# Patient Record
Sex: Male | Born: 1945 | Race: White | Hispanic: No | Marital: Married | Smoking: Former smoker
Health system: Southern US, Community
[De-identification: ages and names within clinical notes are randomized; demographics above are authoritative.]

## PROBLEM LIST (undated history)

## (undated) DIAGNOSIS — K219 Gastro-esophageal reflux disease without esophagitis: Secondary | ICD-10-CM

## (undated) DIAGNOSIS — H409 Unspecified glaucoma: Secondary | ICD-10-CM

## (undated) DIAGNOSIS — M1710 Unilateral primary osteoarthritis, unspecified knee: Secondary | ICD-10-CM

## (undated) DIAGNOSIS — M179 Osteoarthritis of knee, unspecified: Secondary | ICD-10-CM

## (undated) DIAGNOSIS — I10 Essential (primary) hypertension: Secondary | ICD-10-CM

## (undated) DIAGNOSIS — I739 Peripheral vascular disease, unspecified: Secondary | ICD-10-CM

## (undated) DIAGNOSIS — R309 Painful micturition, unspecified: Secondary | ICD-10-CM

## (undated) DIAGNOSIS — M1990 Unspecified osteoarthritis, unspecified site: Secondary | ICD-10-CM

## (undated) HISTORY — DX: Osteoarthritis of knee, unspecified: M17.9

## (undated) HISTORY — PX: TONSILLECTOMY: SUR1361

## (undated) HISTORY — DX: Unilateral primary osteoarthritis, unspecified knee: M17.10

## (undated) HISTORY — DX: Unspecified glaucoma: H40.9

## (undated) HISTORY — PX: KNEE ARTHROSCOPY: SUR90

## (undated) HISTORY — PX: CATARACT EXTRACTION, BILATERAL: SHX1313

## (undated) HISTORY — DX: Painful micturition, unspecified: R30.9

## (undated) HISTORY — PX: ESOPHAGOGASTRODUODENOSCOPY ENDOSCOPY: SHX5814

---

## 2003-02-17 ENCOUNTER — Encounter (INDEPENDENT_AMBULATORY_CARE_PROVIDER_SITE_OTHER): Payer: Self-pay | Admitting: *Deleted

## 2003-02-17 ENCOUNTER — Ambulatory Visit (HOSPITAL_COMMUNITY): Admission: RE | Admit: 2003-02-17 | Discharge: 2003-02-17 | Payer: Self-pay | Admitting: Gastroenterology

## 2007-06-05 ENCOUNTER — Encounter: Admission: RE | Admit: 2007-06-05 | Discharge: 2007-06-05 | Payer: Self-pay | Admitting: Family Medicine

## 2010-08-18 NOTE — Op Note (Signed)
NAME:  Todd Chan, Todd Chan                        ACCOUNT NO.:  [id number]   MEDICAL RECORD NO.:  [id number]                   PATIENT TYPE:  AMB   LOCATION:  ENDO                                 FACILITY:  [facility]   PHYSICIAN:  Graylin Shiver, M.D.                DATE OF BIRTH:  11-20-1945   DATE OF PROCEDURE:  02/17/2003  DATE OF DISCHARGE:                                 OPERATIVE REPORT   PROCEDURE:  Colonoscopy.   INDICATIONS FOR PROCEDURE:  History of colon polyps.   CONSENT:  Informed consent was obtained after explanation of the risks of  bleeding, infection, and perforation.   PREMEDICATION:  Fentanyl 100 mcg IV, Versed 10 mg IV.   PROCEDURE IN DETAIL:  With the patient in the left lateral decubitus  position, a rectal exam was performed and no masses were felt.  The Olympus  colonoscope was inserted into the rectum and advanced around the colon to  the cecum.  Cecal landmarks were identified.  The cecum  and ascending colon  were normal.  The transverse colon was normal.  The descending colon and  sigmoid were normal.  In the rectum, there was a small 3 mm sessile polyp  removed with cold forceps.  He tolerated the procedure well without  complications.   IMPRESSION:  Small rectal polyp.   PLAN:  The pathology will be checked.                                               Graylin Shiver, M.D.    SFG/MEDQ  D:  02/17/2003  T:  02/17/2003  Job:  [id number]   cc:   Joycelyn Rua, M.D.  [address] [place]  [place] [address]  Fax: [phone]

## 2012-07-15 DIAGNOSIS — R350 Frequency of micturition: Secondary | ICD-10-CM | POA: Diagnosis not present

## 2012-07-15 DIAGNOSIS — N410 Acute prostatitis: Secondary | ICD-10-CM | POA: Diagnosis not present

## 2012-12-30 DIAGNOSIS — Z23 Encounter for immunization: Secondary | ICD-10-CM | POA: Diagnosis not present

## 2013-01-28 ENCOUNTER — Ambulatory Visit (HOSPITAL_BASED_OUTPATIENT_CLINIC_OR_DEPARTMENT_OTHER)
Admission: RE | Admit: 2013-01-28 | Discharge: 2013-01-28 | Disposition: A | Discharge status: Home / Self Care | Payer: Medicare Other | Source: Ambulatory Visit | Attending: Family Medicine | Admitting: Family Medicine

## 2013-01-28 ENCOUNTER — Other Ambulatory Visit (HOSPITAL_BASED_OUTPATIENT_CLINIC_OR_DEPARTMENT_OTHER): Payer: Self-pay | Admitting: Family Medicine

## 2013-01-28 DIAGNOSIS — I8390 Asymptomatic varicose veins of unspecified lower extremity: Secondary | ICD-10-CM | POA: Diagnosis not present

## 2013-01-28 DIAGNOSIS — M7989 Other specified soft tissue disorders: Secondary | ICD-10-CM | POA: Insufficient documentation

## 2013-01-28 DIAGNOSIS — M7120 Synovial cyst of popliteal space [Baker], unspecified knee: Secondary | ICD-10-CM | POA: Insufficient documentation

## 2013-01-28 DIAGNOSIS — I8001 Phlebitis and thrombophlebitis of superficial vessels of right lower extremity: Secondary | ICD-10-CM

## 2013-01-28 DIAGNOSIS — M79609 Pain in unspecified limb: Secondary | ICD-10-CM | POA: Insufficient documentation

## 2013-01-28 DIAGNOSIS — I8000 Phlebitis and thrombophlebitis of superficial vessels of unspecified lower extremity: Secondary | ICD-10-CM | POA: Diagnosis not present

## 2013-05-04 DIAGNOSIS — Z136 Encounter for screening for cardiovascular disorders: Secondary | ICD-10-CM | POA: Diagnosis not present

## 2013-05-04 DIAGNOSIS — Z23 Encounter for immunization: Secondary | ICD-10-CM | POA: Diagnosis not present

## 2013-05-04 DIAGNOSIS — I10 Essential (primary) hypertension: Secondary | ICD-10-CM | POA: Diagnosis not present

## 2013-05-04 DIAGNOSIS — L570 Actinic keratosis: Secondary | ICD-10-CM | POA: Diagnosis not present

## 2013-05-04 DIAGNOSIS — Z0000 Encounter for general adult medical examination without abnormal findings: Secondary | ICD-10-CM | POA: Diagnosis not present

## 2013-05-08 ENCOUNTER — Ambulatory Visit (HOSPITAL_COMMUNITY): Payer: Medicare Other | Attending: Cardiovascular Disease

## 2013-05-08 ENCOUNTER — Encounter: Payer: Self-pay | Admitting: Cardiovascular Disease

## 2013-05-08 DIAGNOSIS — F17200 Nicotine dependence, unspecified, uncomplicated: Secondary | ICD-10-CM | POA: Diagnosis not present

## 2013-05-08 DIAGNOSIS — Z1389 Encounter for screening for other disorder: Secondary | ICD-10-CM | POA: Insufficient documentation

## 2013-05-08 DIAGNOSIS — Z87891 Personal history of nicotine dependence: Secondary | ICD-10-CM | POA: Diagnosis not present

## 2013-05-08 DIAGNOSIS — I10 Essential (primary) hypertension: Secondary | ICD-10-CM | POA: Insufficient documentation

## 2013-05-08 DIAGNOSIS — Z136 Encounter for screening for cardiovascular disorders: Secondary | ICD-10-CM

## 2014-01-06 DIAGNOSIS — Z23 Encounter for immunization: Secondary | ICD-10-CM | POA: Diagnosis not present

## 2014-03-23 DIAGNOSIS — R079 Chest pain, unspecified: Secondary | ICD-10-CM | POA: Diagnosis not present

## 2014-03-23 DIAGNOSIS — M546 Pain in thoracic spine: Secondary | ICD-10-CM | POA: Diagnosis not present

## 2014-05-13 DIAGNOSIS — Z0000 Encounter for general adult medical examination without abnormal findings: Secondary | ICD-10-CM | POA: Diagnosis not present

## 2014-05-13 DIAGNOSIS — Z125 Encounter for screening for malignant neoplasm of prostate: Secondary | ICD-10-CM | POA: Diagnosis not present

## 2014-05-13 DIAGNOSIS — I10 Essential (primary) hypertension: Secondary | ICD-10-CM | POA: Diagnosis not present

## 2014-05-13 DIAGNOSIS — K219 Gastro-esophageal reflux disease without esophagitis: Secondary | ICD-10-CM | POA: Diagnosis not present

## 2014-05-13 DIAGNOSIS — Z1211 Encounter for screening for malignant neoplasm of colon: Secondary | ICD-10-CM | POA: Diagnosis not present

## 2014-06-15 ENCOUNTER — Other Ambulatory Visit: Payer: Self-pay | Admitting: Gastroenterology

## 2014-06-15 DIAGNOSIS — Z09 Encounter for follow-up examination after completed treatment for conditions other than malignant neoplasm: Secondary | ICD-10-CM | POA: Diagnosis not present

## 2014-06-15 DIAGNOSIS — D123 Benign neoplasm of transverse colon: Secondary | ICD-10-CM | POA: Diagnosis not present

## 2014-06-15 DIAGNOSIS — Z86010 Personal history of colonic polyps: Secondary | ICD-10-CM | POA: Diagnosis not present

## 2014-06-15 DIAGNOSIS — D126 Benign neoplasm of colon, unspecified: Secondary | ICD-10-CM | POA: Diagnosis not present

## 2014-11-09 DIAGNOSIS — M5416 Radiculopathy, lumbar region: Secondary | ICD-10-CM | POA: Diagnosis not present

## 2014-11-09 DIAGNOSIS — I10 Essential (primary) hypertension: Secondary | ICD-10-CM | POA: Diagnosis not present

## 2014-11-09 DIAGNOSIS — M170 Bilateral primary osteoarthritis of knee: Secondary | ICD-10-CM | POA: Diagnosis not present

## 2014-11-22 DIAGNOSIS — M25561 Pain in right knee: Secondary | ICD-10-CM | POA: Diagnosis not present

## 2014-11-22 DIAGNOSIS — M25562 Pain in left knee: Secondary | ICD-10-CM | POA: Diagnosis not present

## 2014-11-24 DIAGNOSIS — M170 Bilateral primary osteoarthritis of knee: Secondary | ICD-10-CM | POA: Diagnosis not present

## 2014-11-30 DIAGNOSIS — M170 Bilateral primary osteoarthritis of knee: Secondary | ICD-10-CM | POA: Diagnosis not present

## 2014-12-02 DIAGNOSIS — M170 Bilateral primary osteoarthritis of knee: Secondary | ICD-10-CM | POA: Diagnosis not present

## 2014-12-07 DIAGNOSIS — M170 Bilateral primary osteoarthritis of knee: Secondary | ICD-10-CM | POA: Diagnosis not present

## 2014-12-09 DIAGNOSIS — M170 Bilateral primary osteoarthritis of knee: Secondary | ICD-10-CM | POA: Diagnosis not present

## 2014-12-14 DIAGNOSIS — M170 Bilateral primary osteoarthritis of knee: Secondary | ICD-10-CM | POA: Diagnosis not present

## 2014-12-16 DIAGNOSIS — M170 Bilateral primary osteoarthritis of knee: Secondary | ICD-10-CM | POA: Diagnosis not present

## 2014-12-20 DIAGNOSIS — M25561 Pain in right knee: Secondary | ICD-10-CM | POA: Diagnosis not present

## 2015-01-10 DIAGNOSIS — M25561 Pain in right knee: Secondary | ICD-10-CM | POA: Diagnosis not present

## 2015-01-10 DIAGNOSIS — M1711 Unilateral primary osteoarthritis, right knee: Secondary | ICD-10-CM | POA: Diagnosis not present

## 2015-01-10 DIAGNOSIS — M1712 Unilateral primary osteoarthritis, left knee: Secondary | ICD-10-CM | POA: Diagnosis not present

## 2015-01-10 DIAGNOSIS — M25562 Pain in left knee: Secondary | ICD-10-CM | POA: Diagnosis not present

## 2015-01-11 DIAGNOSIS — Z23 Encounter for immunization: Secondary | ICD-10-CM | POA: Diagnosis not present

## 2015-01-17 DIAGNOSIS — M25562 Pain in left knee: Secondary | ICD-10-CM | POA: Diagnosis not present

## 2015-01-17 DIAGNOSIS — M1712 Unilateral primary osteoarthritis, left knee: Secondary | ICD-10-CM | POA: Diagnosis not present

## 2015-01-17 DIAGNOSIS — M1711 Unilateral primary osteoarthritis, right knee: Secondary | ICD-10-CM | POA: Diagnosis not present

## 2015-01-17 DIAGNOSIS — M25561 Pain in right knee: Secondary | ICD-10-CM | POA: Diagnosis not present

## 2015-01-24 DIAGNOSIS — M1711 Unilateral primary osteoarthritis, right knee: Secondary | ICD-10-CM | POA: Diagnosis not present

## 2015-01-24 DIAGNOSIS — M1712 Unilateral primary osteoarthritis, left knee: Secondary | ICD-10-CM | POA: Diagnosis not present

## 2015-01-24 DIAGNOSIS — M25562 Pain in left knee: Secondary | ICD-10-CM | POA: Diagnosis not present

## 2015-01-24 DIAGNOSIS — M25561 Pain in right knee: Secondary | ICD-10-CM | POA: Diagnosis not present

## 2015-02-01 DIAGNOSIS — M170 Bilateral primary osteoarthritis of knee: Secondary | ICD-10-CM | POA: Diagnosis not present

## 2015-02-04 DIAGNOSIS — R0982 Postnasal drip: Secondary | ICD-10-CM | POA: Diagnosis not present

## 2015-02-04 DIAGNOSIS — M545 Low back pain: Secondary | ICD-10-CM | POA: Diagnosis not present

## 2015-02-08 DIAGNOSIS — M170 Bilateral primary osteoarthritis of knee: Secondary | ICD-10-CM | POA: Diagnosis not present

## 2015-02-14 DIAGNOSIS — M545 Low back pain: Secondary | ICD-10-CM | POA: Diagnosis not present

## 2015-02-16 DIAGNOSIS — M545 Low back pain: Secondary | ICD-10-CM | POA: Diagnosis not present

## 2015-02-21 DIAGNOSIS — M545 Low back pain: Secondary | ICD-10-CM | POA: Diagnosis not present

## 2015-02-28 DIAGNOSIS — M545 Low back pain: Secondary | ICD-10-CM | POA: Diagnosis not present

## 2015-03-02 DIAGNOSIS — M545 Low back pain: Secondary | ICD-10-CM | POA: Diagnosis not present

## 2015-03-15 ENCOUNTER — Other Ambulatory Visit: Payer: Self-pay | Admitting: Orthopedic Surgery

## 2015-03-15 DIAGNOSIS — M545 Low back pain: Secondary | ICD-10-CM | POA: Diagnosis not present

## 2015-03-15 DIAGNOSIS — M4806 Spinal stenosis, lumbar region: Secondary | ICD-10-CM | POA: Diagnosis not present

## 2015-03-22 ENCOUNTER — Ambulatory Visit
Admission: RE | Admit: 2015-03-22 | Discharge: 2015-03-22 | Disposition: A | Discharge status: Home / Self Care | Payer: Medicare Other | Source: Ambulatory Visit | Attending: Orthopedic Surgery | Admitting: Orthopedic Surgery

## 2015-03-22 DIAGNOSIS — M4806 Spinal stenosis, lumbar region: Secondary | ICD-10-CM | POA: Diagnosis not present

## 2015-03-22 DIAGNOSIS — M545 Low back pain: Secondary | ICD-10-CM

## 2015-04-15 DIAGNOSIS — M545 Low back pain: Secondary | ICD-10-CM | POA: Diagnosis not present

## 2015-04-15 DIAGNOSIS — M4806 Spinal stenosis, lumbar region: Secondary | ICD-10-CM | POA: Diagnosis not present

## 2015-04-22 DIAGNOSIS — B372 Candidiasis of skin and nail: Secondary | ICD-10-CM | POA: Diagnosis not present

## 2015-04-27 DIAGNOSIS — M5136 Other intervertebral disc degeneration, lumbar region: Secondary | ICD-10-CM | POA: Diagnosis not present

## 2015-05-06 DIAGNOSIS — M170 Bilateral primary osteoarthritis of knee: Secondary | ICD-10-CM | POA: Diagnosis not present

## 2015-05-10 DIAGNOSIS — M4806 Spinal stenosis, lumbar region: Secondary | ICD-10-CM | POA: Diagnosis not present

## 2015-05-10 DIAGNOSIS — M545 Low back pain: Secondary | ICD-10-CM | POA: Diagnosis not present

## 2015-05-13 DIAGNOSIS — Z1389 Encounter for screening for other disorder: Secondary | ICD-10-CM | POA: Diagnosis not present

## 2015-05-13 DIAGNOSIS — Z131 Encounter for screening for diabetes mellitus: Secondary | ICD-10-CM | POA: Diagnosis not present

## 2015-05-13 DIAGNOSIS — B356 Tinea cruris: Secondary | ICD-10-CM | POA: Diagnosis not present

## 2015-06-21 ENCOUNTER — Other Ambulatory Visit (HOSPITAL_BASED_OUTPATIENT_CLINIC_OR_DEPARTMENT_OTHER): Payer: Self-pay | Admitting: Physician Assistant

## 2015-06-21 ENCOUNTER — Ambulatory Visit (HOSPITAL_BASED_OUTPATIENT_CLINIC_OR_DEPARTMENT_OTHER)
Admission: RE | Admit: 2015-06-21 | Discharge: 2015-06-21 | Disposition: A | Discharge status: Home / Self Care | Payer: Medicare Other | Source: Ambulatory Visit | Attending: Physician Assistant | Admitting: Physician Assistant

## 2015-06-21 DIAGNOSIS — R1031 Right lower quadrant pain: Secondary | ICD-10-CM

## 2015-06-21 DIAGNOSIS — R1030 Lower abdominal pain, unspecified: Secondary | ICD-10-CM | POA: Diagnosis not present

## 2015-06-21 DIAGNOSIS — M47896 Other spondylosis, lumbar region: Secondary | ICD-10-CM | POA: Insufficient documentation

## 2015-06-21 DIAGNOSIS — M25551 Pain in right hip: Secondary | ICD-10-CM | POA: Diagnosis not present

## 2015-06-22 ENCOUNTER — Ambulatory Visit (HOSPITAL_BASED_OUTPATIENT_CLINIC_OR_DEPARTMENT_OTHER): Payer: Medicare Other

## 2015-07-05 DIAGNOSIS — M4806 Spinal stenosis, lumbar region: Secondary | ICD-10-CM | POA: Diagnosis not present

## 2015-07-05 DIAGNOSIS — M545 Low back pain: Secondary | ICD-10-CM | POA: Diagnosis not present

## 2015-07-12 ENCOUNTER — Encounter (HOSPITAL_COMMUNITY): Payer: Self-pay | Admitting: Registered Nurse

## 2015-08-18 DIAGNOSIS — R1031 Right lower quadrant pain: Secondary | ICD-10-CM | POA: Diagnosis not present

## 2015-09-05 ENCOUNTER — Inpatient Hospital Stay: Admit: 2015-09-05 | Payer: Medicare Other | Admitting: Orthopedic Surgery

## 2015-09-05 SURGERY — ARTHROPLASTY, KNEE, TOTAL
Anesthesia: Choice | Site: Knee | Laterality: Right

## 2015-09-21 DIAGNOSIS — Z125 Encounter for screening for malignant neoplasm of prostate: Secondary | ICD-10-CM | POA: Diagnosis not present

## 2015-09-21 DIAGNOSIS — I10 Essential (primary) hypertension: Secondary | ICD-10-CM | POA: Diagnosis not present

## 2015-09-21 DIAGNOSIS — E785 Hyperlipidemia, unspecified: Secondary | ICD-10-CM | POA: Diagnosis not present

## 2015-09-21 DIAGNOSIS — M179 Osteoarthritis of knee, unspecified: Secondary | ICD-10-CM | POA: Diagnosis not present

## 2015-09-21 DIAGNOSIS — Z0000 Encounter for general adult medical examination without abnormal findings: Secondary | ICD-10-CM | POA: Diagnosis not present

## 2015-10-17 DIAGNOSIS — Q540 Hypospadias, balanic: Secondary | ICD-10-CM | POA: Diagnosis not present

## 2015-10-17 DIAGNOSIS — R1031 Right lower quadrant pain: Secondary | ICD-10-CM | POA: Diagnosis not present

## 2015-10-17 DIAGNOSIS — B356 Tinea cruris: Secondary | ICD-10-CM | POA: Diagnosis not present

## 2015-12-06 DIAGNOSIS — M5136 Other intervertebral disc degeneration, lumbar region: Secondary | ICD-10-CM | POA: Diagnosis not present

## 2015-12-22 DIAGNOSIS — N390 Urinary tract infection, site not specified: Secondary | ICD-10-CM | POA: Diagnosis not present

## 2015-12-22 DIAGNOSIS — R309 Painful micturition, unspecified: Secondary | ICD-10-CM | POA: Diagnosis not present

## 2016-01-10 DIAGNOSIS — Z23 Encounter for immunization: Secondary | ICD-10-CM | POA: Diagnosis not present

## 2016-04-23 DIAGNOSIS — Q540 Hypospadias, balanic: Secondary | ICD-10-CM | POA: Diagnosis not present

## 2016-04-23 DIAGNOSIS — B356 Tinea cruris: Secondary | ICD-10-CM | POA: Diagnosis not present

## 2016-04-23 DIAGNOSIS — R1031 Right lower quadrant pain: Secondary | ICD-10-CM | POA: Diagnosis not present

## 2016-09-05 DIAGNOSIS — M1712 Unilateral primary osteoarthritis, left knee: Secondary | ICD-10-CM | POA: Diagnosis not present

## 2016-09-11 DIAGNOSIS — L570 Actinic keratosis: Secondary | ICD-10-CM | POA: Diagnosis not present

## 2016-09-11 DIAGNOSIS — E785 Hyperlipidemia, unspecified: Secondary | ICD-10-CM | POA: Diagnosis not present

## 2016-09-11 DIAGNOSIS — Z01818 Encounter for other preprocedural examination: Secondary | ICD-10-CM | POA: Diagnosis not present

## 2016-09-11 DIAGNOSIS — I10 Essential (primary) hypertension: Secondary | ICD-10-CM | POA: Diagnosis not present

## 2016-09-11 DIAGNOSIS — M179 Osteoarthritis of knee, unspecified: Secondary | ICD-10-CM | POA: Diagnosis not present

## 2016-09-14 ENCOUNTER — Ambulatory Visit: Payer: Self-pay | Admitting: Surgical

## 2016-09-14 DIAGNOSIS — M1711 Unilateral primary osteoarthritis, right knee: Secondary | ICD-10-CM | POA: Diagnosis not present

## 2016-09-14 DIAGNOSIS — M1712 Unilateral primary osteoarthritis, left knee: Secondary | ICD-10-CM | POA: Diagnosis not present

## 2016-09-14 NOTE — H&P (Signed)
Todd Chan DOB: 24-May-1945 Married / Language: English / Race: White Male Date of Admission:  10/08/2016 CC:  Left knee pain History of Present Illness The patient is a 71 year old male who comes in today for a preoperative History and Physical. The patient is scheduled for a left total knee arthroplasty to be performed by Dr. [name]. [name], MD at [facility] on 10-08-2016. The patient is a 71 year old male who presented with knee complaints. The patient reports left knee and right knee symptoms including: pain, swelling (bilateral ankles), catching and popping which began year(s) ago without any known injury. The patient describes the severity of the symptoms as moderate in severity. The patient describes their pain as aching.The patient feels that the symptoms are worsening. The patient has the current diagnosis of knee osteoarthritis. Previous work-up for this problem has included knee x-rays. Past treatment for this problem has included intra-articular injection of corticosteroids. Symptoms are reported to be located in the left anterior knee and right anterior knee and include knee pain. The patient reports that symptoms radiate to the right lower leg. Symptoms are exacerbated by weight bearing. Symptoms are relieved by rest. He has had one cortisone injection as well as a round of Supartz following that. No relief with either. He is very frustrated because the knee pain is interfering with his quality of life. Historically the left knee has been worse but he says that some days the right knee is worse and some days the left knee is worse. He says that occasionally he will have some slight groin pain on the right side. Both knees are up giving him a lot of trouble now. He has pain with most activities and the pain is starting to really limit what he can and cannot do. He is not having pain at rest or pain at night at this point. He has had cortisone and viscosupplement injections without  benefit. Historically, the left knee has been worse, but currently both knees hurt equally. He has seen Dr. [name] in the past and also seen Dr. [name] over at [name] for these injections. He was seen by Dr. [name] for second opinion. He is at a stage now where the knees are essentially taken over his life and he would like to be more active, but cannot do so because of the knees. He is not having any associated hip pain with this. He does have some back pain, but no lower extremity weakness or paresthesia. He is ready to proceed with surgery at this time. He would like to proceed with the left knee first at this time. They have been treated conservatively in the past for the above stated problem and despite conservative measures, they continue to have progressive pain and severe functional limitations and dysfunction. They have failed non-operative management including home exercise, medications, and injections. It is felt that they would benefit from undergoing total joint replacement. Risks and benefits of the procedure have been discussed with the patient and they elect to proceed with surgery. There are no active contraindications to surgery such as ongoing infection or rapidly progressive neurological disease.  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 Chronic Obstructive Lung Disease  Brother. Hypertension  Sister. Severe  allergy  Mother. Father  Deceased. age 74 Mother  Deceased. age 52  Social History Tobacco use  Former smoker. 03/14/2015: smoke(d) 2 pack(s) per day, smoked for 20 years Tobacco / smoke exposure  03/14/2015: no Children  2 Current drinker  03/14/2015: Currently drinks beer, wine and  hard liquor 8-14 times per week Current work status  retired Exercise  Light, weekly, 20 - 30 minutes, cycling. Exercises weekly; does other Living situation  live with spouse Marital status  married Most recent primary occupation  Retired No history of drug/alcohol rehab  Not under pain contract  Number of flights of stairs before winded  2-3 Advance Directives  Living Will, Healthcare POA  Medication History Aspirin (81MG  Tablet Chewable, Oral) Active. Lisinopril (10MG  Tablet, Oral) Active. (qd) Vitamin D (1000UNIT Tablet, Oral) Active. (qd) Multi Vitamin Mens (Oral) Active. (qd) Calcium 600 (1500 (600 Ca)MG Tablet, Oral) Active. Fish Oil (1200MG  Capsule DR, Oral) Active. (qd) Aleve (220MG  Tablet, Oral) Active.  Past Surgical History  Arthroscopic Knee Surgery - Left   Review of Systems  Constitutional: Negative.   HENT: Negative.   Respiratory: Negative.   Cardiovascular: Negative.   Gastrointestinal: Negative.   Genitourinary: Negative.   Musculoskeletal: Positive for joint pain.    Vitals  Weight: 281 lb Height: 71.5in Height was reported by patient. Body Surface Area: 2.45 m Body Mass Index: 38.64 kg/m  Pulse: 76 (Regular)  BP: 144/88 (Sitting, Right Arm, Standard)   Physical Exam  General Mental Status -Alert, cooperative and good historian. General Appearance-pleasant, Not in acute distress. Orientation-Oriented X3. Build & Nutrition-Well nourished and Well developed.  Head and Neck Head-normocephalic, atraumatic . Neck Global Assessment - supple, no bruit auscultated on the right, no bruit auscultated on the left.  Eye Vision-Wears contact lenses. Pupil - Bilateral-Regular and Round. Motion - Bilateral-EOMI.  Chest and Lung Exam Auscultation Breath sounds - clear at anterior chest wall and clear at posterior chest wall. Adventitious sounds - No Adventitious sounds.  Cardiovascular Auscultation Rhythm  - Regular rate and rhythm. Heart Sounds - S1 WNL and S2 WNL. Murmurs & Other Heart Sounds - Auscultation of the heart reveals - No Murmurs.  Abdomen Inspection Contour - Generalized moderate distention. Palpation/Percussion Tenderness - Abdomen is non-tender to palpation. Rigidity (guarding) - Abdomen is soft. Auscultation Auscultation of the abdomen reveals - Bowel sounds normal.  Male Genitourinary Note: Not done, not pertinent to present illness   Musculoskeletal Note: On exam, well-developed male, alert and oriented, in no apparent distress. Evaluation of his hips showed normal range of motion with no discomfort. His left knee shows no effusion. Range of motion is about 5 to 125. He has marked crepitus on range of motion, some tenderness medial greater than lateral with no instability. Right knee, no effusion, range about 5 to 125. Marked crepitus on range of motion and tenderness, medial greater than lateral and no instability. Pulse, sensation, motor intact distally. He has trace edema in both lower legs.  RADIOGRAPHS AP both knees and lateral show that he has end-stage arthritis of both knees, bone-on-bone medial and patellofemoral with slight varus.   Assessment & Plan  Primary osteoarthritis of left knee   Note:Surgical Plans: Left Total Knee Replacement  Disposition: Home with help, Straight to outpatient at [place]  PCP: Dr. Olen Pel - given verbal approval  IV TXA  Anesthesia Issues: None  Patient was instructed on what medications to stop prior to surgery.  Signed electronically by Joelene Millin, III PA-C

## 2016-09-14 NOTE — H&P (Signed)
Todd Chan DOB: 03-Jul-1945 Married / Language: English / Race: White Male Date of Admission:  10/08/2016 CC:  Left knee pain History of Present Illness The patient is a 71 year old male who comes in today for a preoperative History and Physical. The patient is scheduled for a left total knee arthroplasty to be performed by Dr. [name]. [name], MD at [facility] on 10-08-2016. The patient is a 71 year old male who presented with knee complaints. The patient reports left knee and right knee symptoms including: pain, swelling (bilateral ankles), catching and popping which began year(s) ago without any known injury. The patient describes the severity of the symptoms as moderate in severity. The patient describes their pain as aching.The patient feels that the symptoms are worsening. The patient has the current diagnosis of knee osteoarthritis. Previous work-up for this problem has included knee x-rays. Past treatment for this problem has included intra-articular injection of corticosteroids. Symptoms are reported to be located in the left anterior knee and right anterior knee and include knee pain. The patient reports that symptoms radiate to the right lower leg. Symptoms are exacerbated by weight bearing. Symptoms are relieved by rest. He has had one cortisone injection as well as a round of Supartz following that. No relief with either. He is very frustrated because the knee pain is interfering with his quality of life. Historically the left knee has been worse but he says that some days the right knee is worse and some days the left knee is worse. He says that occasionally he will have some slight groin pain on the right side. Both knees are up giving him a lot of trouble now. He has pain with most activities and the pain is starting to really limit what he can and cannot do. He is not having pain at rest or pain at night at this point. He has had cortisone and viscosupplement injections without  benefit. Historically, the left knee has been worse, but currently both knees hurt equally. He has seen Dr. [name] in the past and also seen Dr. [name] over at [facility] for these injections. He was seen by Dr. [name] for second opinion. He is at a stage now where the knees are essentially taken over his life and he would like to be more active, but cannot do so because of the knees. He is not having any associated hip pain with this. He does have some back pain, but no lower extremity weakness or paresthesia. He is ready to proceed with surgery at this time. He would like to proceed with the left knee first at this time. They have been treated conservatively in the past for the above stated problem and despite conservative measures, they continue to have progressive pain and severe functional limitations and dysfunction. They have failed non-operative management including home exercise, medications, and injections. It is felt that they would benefit from undergoing total joint replacement. Risks and benefits of the procedure have been discussed with the patient and they elect to proceed with surgery. There are no active contraindications to surgery such as ongoing infection or rapidly progressive neurological disease.  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 Chronic Obstructive Lung Disease  Brother. Hypertension  Sister. Severe  allergy  Mother. Father  Deceased. age 23 Mother  Deceased. age 31  Social History Tobacco use  Former smoker. 03/14/2015: smoke(d) 2 pack(s) per day, smoked for 20 years Tobacco / smoke exposure  03/14/2015: no Children  2 Current drinker  03/14/2015: Currently drinks beer, wine  and hard liquor 8-14 times per week Current work status  retired Exercise  Light, weekly, 20 - 30 minutes, cycling. Exercises weekly; does other Living situation  live with spouse Marital status  married Most recent primary occupation  Retired No history of drug/alcohol rehab  Not under pain contract  Number of flights of stairs before winded  2-3 Advance Directives  Living Will, Healthcare POA  Medication History Aspirin (81MG  Tablet Chewable, Oral) Active. Lisinopril (10MG  Tablet, Oral) Active. (qd) Vitamin D (1000UNIT Tablet, Oral) Active. (qd) Multi Vitamin Mens (Oral) Active. (qd) Calcium 600 (1500 (600 Ca)MG Tablet, Oral) Active. Fish Oil (1200MG  Capsule DR, Oral) Active. (qd) Aleve (220MG  Tablet, Oral) Active.  Past Surgical History  Arthroscopic Knee Surgery - Left   Review of Systems  Constitutional: Negative.   HENT: Negative.   Respiratory: Negative.   Cardiovascular: Negative.   Gastrointestinal: Negative.   Genitourinary: Negative.   Musculoskeletal: Positive for joint pain.    Vitals  Weight: 281 lb Height: 71.5in Height was reported by patient. Body Surface Area: 2.45 m Body Mass Index: 38.64 kg/m  Pulse: 76 (Regular)  BP: 144/88 (Sitting, Right Arm, Standard)   Physical Exam  General Mental Status -Alert, cooperative and good historian. General Appearance-pleasant, Not in acute distress. Orientation-Oriented X3. Build & Nutrition-Well nourished and Well developed.  Head and Neck Head-normocephalic, atraumatic . Neck Global Assessment - supple, no bruit auscultated on the right, no bruit auscultated on the left.  Eye Vision-Wears contact lenses. Pupil - Bilateral-Regular and Round. Motion - Bilateral-EOMI.  Chest and Lung Exam Auscultation Breath sounds - clear at anterior chest wall and clear at posterior chest wall. Adventitious sounds - No Adventitious  sounds.  Cardiovascular Auscultation Rhythm - Regular rate and rhythm. Heart Sounds - S1 WNL and S2 WNL. Murmurs & Other Heart Sounds - Auscultation of the heart reveals - No Murmurs.  Abdomen Inspection Contour - Generalized moderate distention. Palpation/Percussion Tenderness - Abdomen is non-tender to palpation. Rigidity (guarding) - Abdomen is soft. Auscultation Auscultation of the abdomen reveals - Bowel sounds normal.  Male Genitourinary Note: Not done, not pertinent to present illness   Musculoskeletal Note: On exam, well-developed male, alert and oriented, in no apparent distress. Evaluation of his hips showed normal range of motion with no discomfort. His left knee shows no effusion. Range of motion is about 5 to 125. He has marked crepitus on range of motion, some tenderness medial greater than lateral with no instability. Right knee, no effusion, range about 5 to 125. Marked crepitus on range of motion and tenderness, medial greater than lateral and no instability. Pulse, sensation, motor intact distally. He has trace edema in both lower legs.  RADIOGRAPHS AP both knees and lateral show that he has end-stage arthritis of both knees, bone-on-bone medial and patellofemoral with slight varus.   Assessment & Plan  Primary osteoarthritis of left knee   Note:Surgical Plans: Left Total Knee Replacement  Disposition: Home with help, Straight to outpatient at [facility]  PCP: Dr. Olen Pel - given verbal approval  IV TXA  Anesthesia Issues: None  Patient was instructed on what medications to stop prior to surgery.  Signed electronically by Joelene Millin, III PA-C

## 2016-09-26 NOTE — Progress Notes (Signed)
Need orders in epic.  Preop on 10/01/2016.

## 2016-09-28 ENCOUNTER — Other Ambulatory Visit (HOSPITAL_COMMUNITY): Payer: Self-pay | Admitting: Emergency Medicine

## 2016-09-28 ENCOUNTER — Ambulatory Visit: Payer: Self-pay | Admitting: Orthopedic Surgery

## 2016-09-28 NOTE — Patient Instructions (Signed)
Todd Chan  09/28/2016   Your procedure is scheduled on: 10-08-16  Report to [facility] Main  Entrance     Report to admitting at 1010AM   Call this number if you have problems the morning of surgery  [phone]   Remember: ONLY 1 PERSON MAY GO WITH YOU TO SHORT STAY TO GET  READY MORNING OF YOUR SURGERY.  Do not eat food or drink liquids :After Midnight.     Take these medicines the morning of surgery with A SIP OF WATER: none                                You may not have any metal on your body including hair pins and              piercings  Do not wear jewelry, make-up, lotions, powders or perfumes, deodorant                  Men may shave face and neck.   Do not bring valuables to the hospital. [place].  Contacts, dentures or bridgework may not be worn into surgery.  Leave suitcase in the car. After surgery it may be brought to your room.               Please read over the following fact sheets you were given: _____________________________________________________________________          [facility] - Preparing for Surgery Before surgery, you can play an important role.  Because skin is not sterile, your skin needs to be as free of germs as possible.  You can reduce the number of germs on your skin by washing with CHG (chlorahexidine gluconate) soap before surgery.  CHG is an antiseptic cleaner which kills germs and bonds with the skin to continue killing germs even after washing. Please DO NOT use if you have an allergy to CHG or antibacterial soaps.  If your skin becomes reddened/irritated stop using the CHG and inform your nurse when you arrive at Short Stay. Do not shave (including legs and underarms) for at least 48 hours prior to the first CHG shower.  You may shave your face/neck. Please follow these instructions carefully:  1.  Shower with CHG Soap the night before surgery and the   morning of Surgery.  2.  If you choose to wash your hair, wash your hair first as usual with your  normal  shampoo.  3.  After you shampoo, rinse your hair and body thoroughly to remove the  shampoo.                           4.  Use CHG as you would any other liquid soap.  You can apply chg directly  to the skin and wash                       Gently with a scrungie or clean washcloth.  5.  Apply the CHG Soap to your body ONLY FROM THE NECK DOWN.   Do not use on face/ open  Wound or open sores. Avoid contact with eyes, ears mouth and genitals (private parts).                       Wash face,  Genitals (private parts) with your normal soap.             6.  Wash thoroughly, paying special attention to the area where your surgery  will be performed.  7.  Thoroughly rinse your body with warm water from the neck down.  8.  DO NOT shower/wash with your normal soap after using and rinsing off  the CHG Soap.                9.  Pat yourself dry with a clean towel.            10.  Wear clean pajamas.            11.  Place clean sheets on your bed the night of your first shower and do not  sleep with pets. Day of Surgery : Do not apply any lotions/deodorants the morning of surgery.  Please wear clean clothes to the hospital/surgery center.  FAILURE TO FOLLOW THESE INSTRUCTIONS MAY RESULT IN THE CANCELLATION OF YOUR SURGERY PATIENT SIGNATURE_________________________________  NURSE SIGNATURE__________________________________  ________________________________________________________________________   Todd Chan  An incentive spirometer is a tool that can help keep your lungs clear and active. This tool measures how well you are filling your lungs with each breath. Taking long deep breaths may help reverse or decrease the chance of developing breathing (pulmonary) problems (especially infection) following:  A long period of time when you are unable to move or be  active. BEFORE THE PROCEDURE   If the spirometer includes an indicator to show your best effort, your nurse or respiratory therapist will set it to a desired goal.  If possible, sit up straight or lean slightly forward. Try not to slouch.  Hold the incentive spirometer in an upright position. INSTRUCTIONS FOR USE  1. Sit on the edge of your bed if possible, or sit up as far as you can in bed or on a chair. 2. Hold the incentive spirometer in an upright position. 3. Breathe out normally. 4. Place the mouthpiece in your mouth and seal your lips tightly around it. 5. Breathe in slowly and as deeply as possible, raising the piston or the ball toward the top of the column. 6. Hold your breath for 3-5 seconds or for as long as possible. Allow the piston or ball to fall to the bottom of the column. 7. Remove the mouthpiece from your mouth and breathe out normally. 8. Rest for a few seconds and repeat Steps 1 through 7 at least 10 times every 1-2 hours when you are awake. Take your time and take a few normal breaths between deep breaths. 9. The spirometer may include an indicator to show your best effort. Use the indicator as a goal to work toward during each repetition. 10. After each set of 10 deep breaths, practice coughing to be sure your lungs are clear. If you have an incision (the cut made at the time of surgery), support your incision when coughing by placing a pillow or rolled up towels firmly against it. Once you are able to get out of bed, walk around indoors and cough well. You may stop using the incentive spirometer when instructed by your caregiver.  RISKS AND COMPLICATIONS  Take your time so you do not get  dizzy or light-headed.  If you are in pain, you may need to take or ask for pain medication before doing incentive spirometry. It is harder to take a deep breath if you are having pain. AFTER USE  Rest and breathe slowly and easily.  It can be helpful to keep track of a log of  your progress. Your caregiver can provide you with a simple table to help with this. If you are using the spirometer at home, follow these instructions: [place] IF:   You are having difficultly using the spirometer.  You have trouble using the spirometer as often as instructed.  Your pain medication is not giving enough relief while using the spirometer.  You develop fever of 100.5 F (38.1 C) or higher. SEEK IMMEDIATE MEDICAL CARE IF:   You cough up bloody sputum that had not been present before.  You develop fever of 102 F (38.9 C) or greater.  You develop worsening pain at or near the incision site. MAKE SURE YOU:   Understand these instructions.  Will watch your condition.  Will get help right away if you are not doing well or get worse. Document Released: 07/30/2006 Document Revised: 06/11/2011 Document Reviewed: 09/30/2006 Exit[employer] Patient Information 2014 [employer], [place].   ________________________________________________________________________  WHAT IS A BLOOD TRANSFUSION? Blood Transfusion Information  A transfusion is the replacement of blood or some of its parts. Blood is made up of multiple cells which provide different functions.  Red blood cells carry oxygen and are used for blood loss replacement.  White blood cells fight against infection.  Platelets control bleeding.  Plasma helps clot blood.  Other blood products are available for specialized needs, such as hemophilia or other clotting disorders. BEFORE THE TRANSFUSION  Who gives blood for transfusions?   Healthy volunteers who are fully evaluated to make sure their blood is safe. This is blood bank blood. Transfusion therapy is the safest it has ever been in the practice of medicine. Before blood is taken from a donor, a complete history is taken to make sure that person has no history of diseases nor engages in risky social behavior (examples are intravenous drug use or sexual activity  with multiple partners). The donor's travel history is screened to minimize risk of transmitting infections, such as malaria. The donated blood is tested for signs of infectious diseases, such as HIV and hepatitis. The blood is then tested to be sure it is compatible with you in order to minimize the chance of a transfusion reaction. If you or a relative donates blood, this is often done in anticipation of surgery and is not appropriate for emergency situations. It takes many days to process the donated blood. RISKS AND COMPLICATIONS Although transfusion therapy is very safe and saves many lives, the main dangers of transfusion include:   Getting an infectious disease.  Developing a transfusion reaction. This is an allergic reaction to something in the blood you were given. Every precaution is taken to prevent this. The decision to have a blood transfusion has been considered carefully by your caregiver before blood is given. Blood is not given unless the benefits outweigh the risks. AFTER THE TRANSFUSION  Right after receiving a blood transfusion, you will usually feel much better and more energetic. This is especially true if your red blood cells have gotten low (anemic). The transfusion raises the level of the red blood cells which carry oxygen, and this usually causes an energy increase.  The nurse administering the transfusion will  monitor you carefully for complications. HOME CARE INSTRUCTIONS  No special instructions are needed after a transfusion. You may find your energy is better. Speak with your caregiver about any limitations on activity for underlying diseases you may have. SEEK MEDICAL CARE IF:   Your condition is not improving after your transfusion.  You develop redness or irritation at the intravenous (IV) site. SEEK IMMEDIATE MEDICAL CARE IF:  Any of the following symptoms occur over the next 12 hours:  Shaking chills.  You have a temperature by mouth above 102 F (38.9  C), not controlled by medicine.  Chest, back, or muscle pain.  People around you feel you are not acting correctly or are confused.  Shortness of breath or difficulty breathing.  Dizziness and fainting.  You get a rash or develop hives.  You have a decrease in urine output.  Your urine turns a dark color or changes to pink, red, or brown. Any of the following symptoms occur over the next 10 days:  You have a temperature by mouth above 102 F (38.9 C), not controlled by medicine.  Shortness of breath.  Weakness after normal activity.  The white part of the eye turns yellow (jaundice).  You have a decrease in the amount of urine or are urinating less often.  Your urine turns a dark color or changes to pink, red, or brown. Document Released: 03/16/2000 Document Revised: 06/11/2011 Document Reviewed: 11/03/2007 [employer] Patient Information 2014 [place], [place].  _______________________________________________________________________

## 2016-10-01 ENCOUNTER — Encounter (HOSPITAL_COMMUNITY): Payer: Self-pay

## 2016-10-01 ENCOUNTER — Ambulatory Visit: Payer: Self-pay | Admitting: Orthopedic Surgery

## 2016-10-01 ENCOUNTER — Encounter (HOSPITAL_COMMUNITY)
Admission: RE | Admit: 2016-10-01 | Discharge: 2016-10-01 | Disposition: A | Discharge status: Home / Self Care | Payer: Medicare Other | Source: Ambulatory Visit | Attending: Orthopedic Surgery | Admitting: Orthopedic Surgery

## 2016-10-01 DIAGNOSIS — I10 Essential (primary) hypertension: Secondary | ICD-10-CM | POA: Diagnosis not present

## 2016-10-01 DIAGNOSIS — Z01812 Encounter for preprocedural laboratory examination: Secondary | ICD-10-CM | POA: Diagnosis not present

## 2016-10-01 DIAGNOSIS — Z01818 Encounter for other preprocedural examination: Secondary | ICD-10-CM | POA: Diagnosis not present

## 2016-10-01 DIAGNOSIS — Z0183 Encounter for blood typing: Secondary | ICD-10-CM | POA: Diagnosis not present

## 2016-10-01 HISTORY — DX: Essential (primary) hypertension: I10

## 2016-10-01 HISTORY — DX: Peripheral vascular disease, unspecified: I73.9

## 2016-10-01 HISTORY — DX: Gastro-esophageal reflux disease without esophagitis: K21.9

## 2016-10-01 HISTORY — DX: Unspecified osteoarthritis, unspecified site: M19.90

## 2016-10-01 LAB — CBC
HCT: 42.1 % (ref 39.0–52.0)
HEMOGLOBIN: 14.7 g/dL (ref 13.0–17.0)
MCH: 34.1 pg — ABNORMAL HIGH (ref 26.0–34.0)
MCHC: 34.9 g/dL (ref 30.0–36.0)
MCV: 97.7 fL (ref 78.0–100.0)
Platelets: 174 10*3/uL (ref 150–400)
RBC: 4.31 MIL/uL (ref 4.22–5.81)
RDW: 13.1 % (ref 11.5–15.5)
WBC: 4.7 10*3/uL (ref 4.0–10.5)

## 2016-10-01 LAB — ABO/RH: ABO/RH(D): A NEG

## 2016-10-01 LAB — SURGICAL PCR SCREEN
MRSA, PCR: NEGATIVE
STAPHYLOCOCCUS AUREUS: POSITIVE — AB

## 2016-10-01 LAB — PROTIME-INR
INR: 1.01
PROTHROMBIN TIME: 13.3 s (ref 11.4–15.2)

## 2016-10-01 LAB — APTT: aPTT: 29 seconds (ref 24–36)

## 2016-10-01 NOTE — Progress Notes (Signed)
   10/01/16 0905  OBSTRUCTIVE SLEEP APNEA  Have you ever been diagnosed with sleep apnea through a sleep study? No  Do you snore loudly (loud enough to be heard through closed doors)?  1  Do you often feel tired, fatigued, or sleepy during the daytime (such as falling asleep during driving or talking to someone)? 0  Has anyone observed you stop breathing during your sleep? 0  Do you have, or are you being treated for high blood pressure? 1  BMI more than 35 kg/m2? 1  Age > 50 (1-yes) 1  Neck circumference greater than:Male 16 inches or larger, Male 17inches or larger? 1  Male Gender (Yes=1) 1  Obstructive Sleep Apnea Score 6

## 2016-10-01 NOTE — Progress Notes (Signed)
Pa from [facility] placed new informed consent with correct limb listed . RN will indicate on front page sheet for Short Stay to have patient sign new, correct consent on day of surgery as orders were placed in epic AFTER pt completed their pre-op and left hospital.

## 2016-10-01 NOTE — Progress Notes (Signed)
Surgical clearance Dr Olen Pel and Aron Baba PA-C on chart LOV Hepler PA-C 09-11-16 on chart

## 2016-10-01 NOTE — Progress Notes (Signed)
Per pat request , RN called [facility] to request lab work , Spoke with Caryl Pina who confirms she will fax over a copy.  Also , at pre-op pt states that he is supposed be having a left total knee replacement on 10-08-16 with [name]. Surgery schedule lists a left knee, however informed consent form in EPIC lists a right total knee. RN called over to [employer] and LVMM for [employer] to inquire about incorrect consent. RN will continue to F/U as needed

## 2016-10-02 NOTE — Progress Notes (Signed)
CMP 09-11-16 on chart included with LOV summary with Hepler PA-C

## 2016-10-07 MED ORDER — DEXTROSE 5 % IV SOLN
3.0000 g | INTRAVENOUS | Status: AC
  Administered 2016-10-08: 3 g via INTRAVENOUS
  Filled 2016-10-07: qty 3

## 2016-10-08 ENCOUNTER — Encounter (HOSPITAL_COMMUNITY)
Admission: RE | Disposition: A | Discharge status: Home / Self Care | Payer: Self-pay | Source: Ambulatory Visit | Attending: Orthopedic Surgery

## 2016-10-08 ENCOUNTER — Inpatient Hospital Stay (HOSPITAL_COMMUNITY)
Admission: RE | Admit: 2016-10-08 | Discharge: 2016-10-10 | DRG: 470 | Disposition: A | Discharge status: Home / Self Care | Payer: Medicare Other | Source: Ambulatory Visit | Attending: Orthopedic Surgery | Admitting: Orthopedic Surgery

## 2016-10-08 ENCOUNTER — Inpatient Hospital Stay (HOSPITAL_COMMUNITY): Payer: Medicare Other | Admitting: Anesthesiology

## 2016-10-08 ENCOUNTER — Encounter (HOSPITAL_COMMUNITY): Payer: Self-pay | Admitting: *Deleted

## 2016-10-08 DIAGNOSIS — M1712 Unilateral primary osteoarthritis, left knee: Secondary | ICD-10-CM | POA: Diagnosis not present

## 2016-10-08 DIAGNOSIS — Z8249 Family history of ischemic heart disease and other diseases of the circulatory system: Secondary | ICD-10-CM

## 2016-10-08 DIAGNOSIS — Z79899 Other long term (current) drug therapy: Secondary | ICD-10-CM | POA: Diagnosis not present

## 2016-10-08 DIAGNOSIS — K219 Gastro-esophageal reflux disease without esophagitis: Secondary | ICD-10-CM | POA: Diagnosis present

## 2016-10-08 DIAGNOSIS — M48061 Spinal stenosis, lumbar region without neurogenic claudication: Secondary | ICD-10-CM | POA: Diagnosis not present

## 2016-10-08 DIAGNOSIS — Z7982 Long term (current) use of aspirin: Secondary | ICD-10-CM

## 2016-10-08 DIAGNOSIS — Q540 Hypospadias, balanic: Secondary | ICD-10-CM

## 2016-10-08 DIAGNOSIS — M179 Osteoarthritis of knee, unspecified: Secondary | ICD-10-CM | POA: Diagnosis present

## 2016-10-08 DIAGNOSIS — M1710 Unilateral primary osteoarthritis, unspecified knee: Secondary | ICD-10-CM | POA: Diagnosis present

## 2016-10-08 DIAGNOSIS — I10 Essential (primary) hypertension: Secondary | ICD-10-CM | POA: Diagnosis not present

## 2016-10-08 DIAGNOSIS — Z87891 Personal history of nicotine dependence: Secondary | ICD-10-CM | POA: Diagnosis not present

## 2016-10-08 DIAGNOSIS — M25562 Pain in left knee: Secondary | ICD-10-CM | POA: Diagnosis not present

## 2016-10-08 DIAGNOSIS — G8918 Other acute postprocedural pain: Secondary | ICD-10-CM | POA: Diagnosis not present

## 2016-10-08 DIAGNOSIS — M797 Fibromyalgia: Secondary | ICD-10-CM | POA: Diagnosis present

## 2016-10-08 DIAGNOSIS — I739 Peripheral vascular disease, unspecified: Secondary | ICD-10-CM | POA: Diagnosis present

## 2016-10-08 HISTORY — PX: TOTAL KNEE ARTHROPLASTY: SHX125

## 2016-10-08 HISTORY — PX: REPLACEMENT TOTAL KNEE: SUR1224

## 2016-10-08 LAB — COMPREHENSIVE METABOLIC PANEL
ALBUMIN: 3.7 g/dL (ref 3.5–5.0)
ALT: 21 U/L (ref 17–63)
ANION GAP: 7 (ref 5–15)
AST: 25 U/L (ref 15–41)
Alkaline Phosphatase: 27 U/L — ABNORMAL LOW (ref 38–126)
BUN: 17 mg/dL (ref 6–20)
CO2: 30 mmol/L (ref 22–32)
Calcium: 8.6 mg/dL — ABNORMAL LOW (ref 8.9–10.3)
Chloride: 103 mmol/L (ref 101–111)
Creatinine, Ser: 0.89 mg/dL (ref 0.61–1.24)
GFR calc Af Amer: >60 mL/min (ref >60)
GFR calc non Af Amer: >60 mL/min (ref >60)
GLUCOSE: 142 mg/dL — AB (ref 65–99)
POTASSIUM: 4.7 mmol/L (ref 3.5–5.1)
SODIUM: 140 mmol/L (ref 135–145)
Total Bilirubin: 0.9 mg/dL (ref 0.3–1.2)
Total Protein: 6.2 g/dL — ABNORMAL LOW (ref 6.5–8.1)

## 2016-10-08 LAB — TYPE AND SCREEN
ABO/RH(D): A NEG
ANTIBODY SCREEN: NEGATIVE

## 2016-10-08 LAB — PROTIME-INR
INR: 1.03
PROTHROMBIN TIME: 13.5 s (ref 11.4–15.2)

## 2016-10-08 SURGERY — ARTHROPLASTY, KNEE, TOTAL
Anesthesia: Spinal | Site: Knee | Laterality: Left

## 2016-10-08 MED ORDER — ONDANSETRON HCL 4 MG/2ML IJ SOLN
4.0000 mg | Freq: Four times a day (QID) | INTRAMUSCULAR | Status: DC | PRN
Start: 2016-10-08 — End: 2016-10-10

## 2016-10-08 MED ORDER — PHENOL 1.4 % MT LIQD
1.0000 | OROMUCOSAL | Status: DC | PRN
  Filled 2016-10-08: qty 177

## 2016-10-08 MED ORDER — POLYETHYLENE GLYCOL 3350 17 G PO PACK: 17.0000 g | PACK | Freq: Every day | ORAL | Status: DC | PRN

## 2016-10-08 MED ORDER — OXYCODONE HCL 5 MG PO TABS
5.0000 mg | ORAL_TABLET | ORAL | Status: DC | PRN
  Administered 2016-10-08 – 2016-10-10 (×14): 10 mg via ORAL
  Filled 2016-10-08 (×14): qty 2

## 2016-10-08 MED ORDER — BISACODYL 10 MG RE SUPP: 10.0000 mg | Freq: Every day | RECTAL | Status: DC | PRN

## 2016-10-08 MED ORDER — MIDAZOLAM HCL 5 MG/ML IJ SOLN
2.0000 mg | Freq: Once | INTRAMUSCULAR | Status: AC
  Administered 2016-10-08: 2 mg via INTRAVENOUS

## 2016-10-08 MED ORDER — DOCUSATE SODIUM 100 MG PO CAPS
100.0000 mg | ORAL_CAPSULE | Freq: Two times a day (BID) | ORAL | Status: DC
  Administered 2016-10-08 – 2016-10-10 (×4): 100 mg via ORAL
  Filled 2016-10-08 (×4): qty 1

## 2016-10-08 MED ORDER — MIDAZOLAM HCL 2 MG/2ML IJ SOLN
INTRAMUSCULAR | Status: AC
Start: 2016-10-08 — End: 2016-10-08
  Filled 2016-10-08: qty 2

## 2016-10-08 MED ORDER — METHOCARBAMOL 1000 MG/10ML IJ SOLN
500.0000 mg | Freq: Four times a day (QID) | INTRAVENOUS | Status: DC | PRN
  Administered 2016-10-08: 500 mg via INTRAVENOUS
  Filled 2016-10-08: qty 550

## 2016-10-08 MED ORDER — ACETAMINOPHEN 650 MG RE SUPP: 650.0000 mg | Freq: Four times a day (QID) | RECTAL | Status: DC | PRN

## 2016-10-08 MED ORDER — MIDAZOLAM HCL 5 MG/5ML IJ SOLN
INTRAMUSCULAR | Status: DC | PRN
  Administered 2016-10-08: 2 mg via INTRAVENOUS

## 2016-10-08 MED ORDER — BACITRACIN ZINC 500 UNIT/GM EX OINT
TOPICAL_OINTMENT | CUTANEOUS | Status: DC | PRN
  Administered 2016-10-08: 1 via TOPICAL

## 2016-10-08 MED ORDER — BUPIVACAINE IN DEXTROSE 0.75-8.25 % IT SOLN
INTRATHECAL | Status: DC | PRN
  Administered 2016-10-08: 2 mL via INTRATHECAL

## 2016-10-08 MED ORDER — PROPOFOL 500 MG/50ML IV EMUL
INTRAVENOUS | Status: DC | PRN
  Administered 2016-10-08: 100 ug/kg/min via INTRAVENOUS

## 2016-10-08 MED ORDER — GABAPENTIN 300 MG PO CAPS
300.0000 mg | ORAL_CAPSULE | Freq: Once | ORAL | Status: AC
  Administered 2016-10-08: 300 mg via ORAL

## 2016-10-08 MED ORDER — CEFAZOLIN SODIUM-DEXTROSE 2-4 GM/100ML-% IV SOLN
2.0000 g | Freq: Four times a day (QID) | INTRAVENOUS | Status: AC
  Administered 2016-10-08 – 2016-10-09 (×2): 2 g via INTRAVENOUS
  Filled 2016-10-08: qty 100

## 2016-10-08 MED ORDER — RIVAROXABAN 10 MG PO TABS
10.0000 mg | ORAL_TABLET | Freq: Every day | ORAL | Status: DC
  Administered 2016-10-09 – 2016-10-10 (×2): 10 mg via ORAL
  Filled 2016-10-08 (×2): qty 1

## 2016-10-08 MED ORDER — SODIUM CHLORIDE 0.9 % IJ SOLN
INTRAMUSCULAR | Status: AC
  Filled 2016-10-08: qty 50

## 2016-10-08 MED ORDER — SODIUM CHLORIDE 0.9 % IJ SOLN
INTRAMUSCULAR | Status: DC | PRN
  Administered 2016-10-08: 60 mL

## 2016-10-08 MED ORDER — METOCLOPRAMIDE HCL 5 MG PO TABS: 5.0000 mg | ORAL_TABLET | Freq: Three times a day (TID) | ORAL | Status: DC | PRN

## 2016-10-08 MED ORDER — FENTANYL CITRATE (PF) 100 MCG/2ML IJ SOLN: 25.0000 ug | INTRAMUSCULAR | Status: DC | PRN

## 2016-10-08 MED ORDER — DEXAMETHASONE SODIUM PHOSPHATE 10 MG/ML IJ SOLN
INTRAMUSCULAR | Status: AC
  Filled 2016-10-08: qty 1

## 2016-10-08 MED ORDER — LACTATED RINGERS IV SOLN: INTRAVENOUS | Status: DC

## 2016-10-08 MED ORDER — CHLORHEXIDINE GLUCONATE 4 % EX LIQD: 60.0000 mL | Freq: Once | CUTANEOUS | Status: DC

## 2016-10-08 MED ORDER — DEXAMETHASONE SODIUM PHOSPHATE 10 MG/ML IJ SOLN
10.0000 mg | Freq: Once | INTRAMUSCULAR | Status: AC
  Administered 2016-10-09: 09:00:00 10 mg via INTRAVENOUS
  Filled 2016-10-08: qty 1

## 2016-10-08 MED ORDER — PHENYLEPHRINE HCL 10 MG/ML IJ SOLN
INTRAVENOUS | Status: DC | PRN
  Administered 2016-10-08: 25 ug/min via INTRAVENOUS

## 2016-10-08 MED ORDER — ONDANSETRON HCL 4 MG/2ML IJ SOLN: 4.0000 mg | Freq: Once | INTRAMUSCULAR | Status: DC | PRN

## 2016-10-08 MED ORDER — MIDAZOLAM HCL 2 MG/2ML IJ SOLN
INTRAMUSCULAR | Status: AC
  Filled 2016-10-08: qty 2

## 2016-10-08 MED ORDER — PHENYLEPHRINE HCL 10 MG/ML IJ SOLN
INTRAMUSCULAR | Status: AC
  Filled 2016-10-08: qty 1

## 2016-10-08 MED ORDER — METHOCARBAMOL 500 MG PO TABS
500.0000 mg | ORAL_TABLET | Freq: Four times a day (QID) | ORAL | Status: DC | PRN
  Administered 2016-10-09 – 2016-10-10 (×5): 500 mg via ORAL
  Filled 2016-10-08 (×5): qty 1

## 2016-10-08 MED ORDER — PHENYLEPHRINE HCL 10 MG/ML IJ SOLN
INTRAMUSCULAR | Status: DC | PRN
  Administered 2016-10-08 (×2): 80 ug via INTRAVENOUS

## 2016-10-08 MED ORDER — ROPIVACAINE HCL 5 MG/ML IJ SOLN
INTRAMUSCULAR | Status: DC | PRN
  Administered 2016-10-08: 30 mL via EPIDURAL

## 2016-10-08 MED ORDER — METOCLOPRAMIDE HCL 5 MG/ML IJ SOLN: 5.0000 mg | Freq: Three times a day (TID) | INTRAMUSCULAR | Status: DC | PRN

## 2016-10-08 MED ORDER — FENTANYL CITRATE (PF) 100 MCG/2ML IJ SOLN
100.0000 ug | Freq: Once | INTRAMUSCULAR | Status: AC
  Administered 2016-10-08: 100 ug via INTRAVENOUS

## 2016-10-08 MED ORDER — DEXAMETHASONE SODIUM PHOSPHATE 10 MG/ML IJ SOLN
10.0000 mg | Freq: Once | INTRAMUSCULAR | Status: AC
  Administered 2016-10-08 (×2): 10 mg via INTRAVENOUS

## 2016-10-08 MED ORDER — ACETAMINOPHEN 500 MG PO TABS
1000.0000 mg | ORAL_TABLET | Freq: Four times a day (QID) | ORAL | Status: AC
  Administered 2016-10-08 – 2016-10-09 (×4): 1000 mg via ORAL
  Filled 2016-10-08 (×4): qty 2

## 2016-10-08 MED ORDER — ACETAMINOPHEN 10 MG/ML IV SOLN
1000.0000 mg | Freq: Once | INTRAVENOUS | Status: AC
  Administered 2016-10-08: 1000 mg via INTRAVENOUS

## 2016-10-08 MED ORDER — ACETAMINOPHEN 325 MG PO TABS: 650.0000 mg | ORAL_TABLET | Freq: Four times a day (QID) | ORAL | Status: DC | PRN

## 2016-10-08 MED ORDER — SODIUM CHLORIDE 0.9 % IV SOLN
1000.0000 mg | Freq: Once | INTRAVENOUS | Status: AC
  Administered 2016-10-08: 17:00:00 1000 mg via INTRAVENOUS
  Filled 2016-10-08: qty 1100

## 2016-10-08 MED ORDER — ACETAMINOPHEN 10 MG/ML IV SOLN
INTRAVENOUS | Status: AC
  Filled 2016-10-08: qty 100

## 2016-10-08 MED ORDER — FENTANYL CITRATE (PF) 100 MCG/2ML IJ SOLN
INTRAMUSCULAR | Status: AC
  Administered 2016-10-08: 100 ug via INTRAVENOUS
  Filled 2016-10-08: qty 2

## 2016-10-08 MED ORDER — BUPIVACAINE LIPOSOME 1.3 % IJ SUSP
INTRAMUSCULAR | Status: DC | PRN
  Administered 2016-10-08: 80 mL

## 2016-10-08 MED ORDER — MENTHOL 3 MG MT LOZG: 1.0000 | LOZENGE | OROMUCOSAL | Status: DC | PRN

## 2016-10-08 MED ORDER — SODIUM CHLORIDE 0.9 % IV SOLN
INTRAVENOUS | Status: DC
  Administered 2016-10-08 (×2): via INTRAVENOUS

## 2016-10-08 MED ORDER — PROPOFOL 10 MG/ML IV BOLUS
INTRAVENOUS | Status: AC
  Filled 2016-10-08: qty 20

## 2016-10-08 MED ORDER — DIPHENHYDRAMINE HCL 12.5 MG/5ML PO ELIX: 12.5000 mg | ORAL_SOLUTION | ORAL | Status: DC | PRN

## 2016-10-08 MED ORDER — MEPERIDINE HCL 50 MG/ML IJ SOLN: 6.2500 mg | INTRAMUSCULAR | Status: DC | PRN

## 2016-10-08 MED ORDER — BACITRACIN ZINC 500 UNIT/GM EX OINT
TOPICAL_OINTMENT | CUTANEOUS | Status: AC
  Filled 2016-10-08: qty 28.35

## 2016-10-08 MED ORDER — FLEET ENEMA 7-19 GM/118ML RE ENEM: 1.0000 | ENEMA | Freq: Once | RECTAL | Status: DC | PRN

## 2016-10-08 MED ORDER — ONDANSETRON HCL 4 MG/2ML IJ SOLN
INTRAMUSCULAR | Status: DC | PRN
  Administered 2016-10-08: 4 mg via INTRAVENOUS

## 2016-10-08 MED ORDER — ONDANSETRON HCL 4 MG/2ML IJ SOLN
INTRAMUSCULAR | Status: AC
  Filled 2016-10-08: qty 2

## 2016-10-08 MED ORDER — ONDANSETRON HCL 4 MG PO TABS: 4.0000 mg | ORAL_TABLET | Freq: Four times a day (QID) | ORAL | Status: DC | PRN

## 2016-10-08 MED ORDER — LACTATED RINGERS IV SOLN
INTRAVENOUS | Status: DC
  Administered 2016-10-08 (×2): via INTRAVENOUS

## 2016-10-08 MED ORDER — TRANEXAMIC ACID 1000 MG/10ML IV SOLN
1000.0000 mg | INTRAVENOUS | Status: AC
  Administered 2016-10-08: 1000 mg via INTRAVENOUS
  Filled 2016-10-08: qty 1100

## 2016-10-08 MED ORDER — SODIUM CHLORIDE 0.9 % IJ SOLN
INTRAMUSCULAR | Status: AC
  Filled 2016-10-08: qty 10

## 2016-10-08 MED ORDER — MORPHINE SULFATE (PF) 4 MG/ML IV SOLN: 1.0000 mg | INTRAVENOUS | Status: DC | PRN

## 2016-10-08 MED ORDER — TRAMADOL HCL 50 MG PO TABS: 50.0000 mg | ORAL_TABLET | Freq: Four times a day (QID) | ORAL | Status: DC | PRN

## 2016-10-08 MED ORDER — BUPIVACAINE LIPOSOME 1.3 % IJ SUSP
20.0000 mL | Freq: Once | INTRAMUSCULAR | Status: DC
  Filled 2016-10-08: qty 20

## 2016-10-08 MED ORDER — GABAPENTIN 300 MG PO CAPS
ORAL_CAPSULE | ORAL | Status: AC
  Administered 2016-10-08: 300 mg via ORAL
  Filled 2016-10-08: qty 1

## 2016-10-08 SURGICAL SUPPLY — 53 items
BAG DECANTER FOR FLEXI CONT (MISCELLANEOUS) ×3 IMPLANT
BAG ZIPLOCK 12X15 (MISCELLANEOUS) IMPLANT
BANDAGE ACE 6X5 VEL STRL LF (GAUZE/BANDAGES/DRESSINGS) ×3 IMPLANT
BLADE SAG 18X100X1.27 (BLADE) ×3 IMPLANT
BLADE SAW SGTL 11.0X1.19X90.0M (BLADE) ×3 IMPLANT
BOWL SMART MIX CTS (DISPOSABLE) ×3 IMPLANT
CAPT KNEE TOTAL 3 ATTUNE ×3 IMPLANT
CATH FOLEY 2WAY SLVR  5CC 12FR (CATHETERS) ×2
CATH FOLEY 2WAY SLVR 5CC 12FR (CATHETERS) ×1 IMPLANT
CATH PEDI SILICON 3C 10FR (CATHETERS) ×3 IMPLANT
CEMENT HV SMART SET (Cement) ×6 IMPLANT
CLOSURE WOUND 1/2 X4 (GAUZE/BANDAGES/DRESSINGS) ×2
COVER SURGICAL LIGHT HANDLE (MISCELLANEOUS) ×3 IMPLANT
CUFF TOURN SGL QUICK 34 (TOURNIQUET CUFF) ×2
CUFF TRNQT CYL 34X4X40X1 (TOURNIQUET CUFF) ×1 IMPLANT
DECANTER SPIKE VIAL GLASS SM (MISCELLANEOUS) ×3 IMPLANT
DRAPE U-SHAPE 47X51 STRL (DRAPES) ×3 IMPLANT
DRSG ADAPTIC 3X8 NADH LF (GAUZE/BANDAGES/DRESSINGS) ×3 IMPLANT
DRSG PAD ABDOMINAL 8X10 ST (GAUZE/BANDAGES/DRESSINGS) ×3 IMPLANT
DURAPREP 26ML APPLICATOR (WOUND CARE) ×3 IMPLANT
ELECT REM PT RETURN 15FT ADLT (MISCELLANEOUS) ×3 IMPLANT
EVACUATOR 1/8 PVC DRAIN (DRAIN) ×3 IMPLANT
GAUZE SPONGE 4X4 12PLY STRL (GAUZE/BANDAGES/DRESSINGS) ×3 IMPLANT
GLOVE BIO SURGEON STRL SZ7.5 (GLOVE) ×18 IMPLANT
GLOVE BIO SURGEON STRL SZ8 (GLOVE) ×3 IMPLANT
GLOVE BIOGEL PI IND STRL 6.5 (GLOVE) IMPLANT
GLOVE BIOGEL PI IND STRL 8 (GLOVE) ×1 IMPLANT
GLOVE BIOGEL PI INDICATOR 6.5 (GLOVE)
GLOVE BIOGEL PI INDICATOR 8 (GLOVE) ×2
GLOVE SURG SS PI 6.5 STRL IVOR (GLOVE) IMPLANT
GOWN STRL REUS W/TWL LRG LVL3 (GOWN DISPOSABLE) ×3 IMPLANT
GOWN STRL REUS W/TWL XL LVL3 (GOWN DISPOSABLE) ×12 IMPLANT
HANDPIECE INTERPULSE COAX TIP (DISPOSABLE) ×2
IMMOBILIZER KNEE 20 (SOFTGOODS) ×3
IMMOBILIZER KNEE 20 THIGH 36 (SOFTGOODS) ×1 IMPLANT
MANIFOLD NEPTUNE II (INSTRUMENTS) ×3 IMPLANT
NS IRRIG 1000ML POUR BTL (IV SOLUTION) ×3 IMPLANT
PACK TOTAL KNEE CUSTOM (KITS) ×3 IMPLANT
PADDING CAST COTTON 6X4 STRL (CAST SUPPLIES) ×9 IMPLANT
POSITIONER SURGICAL ARM (MISCELLANEOUS) ×3 IMPLANT
SET HNDPC FAN SPRY TIP SCT (DISPOSABLE) ×1 IMPLANT
SET IRRIG Y TYPE TUR BLADDER L (SET/KITS/TRAYS/PACK) ×3 IMPLANT
STRIP CLOSURE SKIN 1/2X4 (GAUZE/BANDAGES/DRESSINGS) ×4 IMPLANT
SUT MNCRL AB 4-0 PS2 18 (SUTURE) ×3 IMPLANT
SUT STRATAFIX 0 PDS 27 VIOLET (SUTURE) ×3
SUT VIC AB 2-0 CT1 27 (SUTURE) ×6
SUT VIC AB 2-0 CT1 TAPERPNT 27 (SUTURE) ×3 IMPLANT
SUTURE STRATFX 0 PDS 27 VIOLET (SUTURE) ×1 IMPLANT
SYR 30ML LL (SYRINGE) ×6 IMPLANT
TRAY FOLEY W/METER SILVER 16FR (SET/KITS/TRAYS/PACK) ×6 IMPLANT
WATER STERILE IRR 1000ML POUR (IV SOLUTION) ×6 IMPLANT
WRAP KNEE MAXI GEL POST OP (GAUZE/BANDAGES/DRESSINGS) ×3 IMPLANT
YANKAUER SUCT BULB TIP 10FT TU (MISCELLANEOUS) ×3 IMPLANT

## 2016-10-08 NOTE — Progress Notes (Signed)
Assisted Dr. Oddono with left, ultrasound guided, adductor canal block. Side rails up, monitors on throughout procedure. See vital signs in flow sheet. Tolerated Procedure well.  

## 2016-10-08 NOTE — Procedures (Signed)
Foley catheter procedure note  Indications:  Patient is a 71 yo M undergoing orthopedic surgery. Primary team requested a catheter for 24 hours post operatively. Nursing with difficulty placing catheter, urology was consulted for additional assistance.  Pre-procedure diagnosis: Glanular hypospadias  Post-procedure diagnosis: Glanular hypospadias  Surgeon: Jonna Clark, MD  Assistants: None  Procedure Details:  Patient was under general anesthesia in the operating room in the supine prosition, prepped with betadine and draped in the usual sterile fashion when encountered. On exam, patient was noted to have a glanular hypospadias, with the true urethral opening at the coronal rim. Lidocaine jelly was inserted per urethra prior to the procedure. I then inserted a 66 French straight catheter per urethra which passed easily without resistance met at the prostatic urethra. I achieved return of clear yellow urine and the proceeded to insert 61m of sterile water into the foley balloon. The catheter was attached to a drainage bag and secured with a statlock. Bacitracin was then applied to the false meatus as mild trauma was visualized.   Complication: None, patient tolerated the procedure well   Plan:  1. Foley per primary team, no trauma to true urethra. Can be removed tomorrow as planned.  2. Bacitracin to glans  3. No indication for additional antibiotis 4.  Follow up with urology PRN  Attending Attestation: Dr. BPilar Jarviswas available.   ---- PJonna Clark MD Urologic Surgery Resident

## 2016-10-08 NOTE — Anesthesia Procedure Notes (Signed)
Spinal  Patient location during procedure: OR Start time: 10/08/2016 12:41 PM End time: 10/08/2016 12:46 PM Staffing Anesthesiologist: Janeece Riggers Resident/CRNA: Chord Takahashi G Performed: resident/CRNA  Preanesthetic Checklist Completed: patient identified, site marked, surgical consent, pre-op evaluation, timeout performed, IV checked, risks and benefits discussed and monitors and equipment checked Spinal Block Patient position: sitting Prep: DuraPrep Patient monitoring: heart rate, continuous pulse ox and blood pressure Approach: midline Location: L2-3 Injection technique: single-shot Needle Needle type: Pencan  Needle gauge: 24 G Needle length: 10 cm Needle insertion depth: 8 cm Assessment Sensory level: T6 Additional Notes Kit expiration date checked and verified.  -heme, -paraesthesia, +CSF pre and post injection, patient tolerated well.

## 2016-10-08 NOTE — Transfer of Care (Signed)
Immediate Anesthesia Transfer of Care Note  Patient: Todd Chan  Procedure(s) Performed: Procedure(s): LEFT TOTAL KNEE ARTHROPLASTY (Left)  Patient Location: PACU  Anesthesia Type:Spinal  Level of Consciousness: awake, alert  and oriented  Airway & Oxygen Therapy: Patient Spontanous Breathing and Patient connected to face mask oxygen  Post-op Assessment: Report given to RN and Post -op Vital signs reviewed and stable  Post vital signs: Reviewed and stable  Last Vitals:  Vitals:   10/08/16 1145 10/08/16 1452  BP: (!) 141/84 128/84  Pulse: 87 71  Resp: (!) 9   Temp:      Last Pain:  Vitals:   10/08/16 1044  TempSrc: Oral      Patients Stated Pain Goal: 4 (73/71/06 2694)  Complications: No apparent anesthesia complications

## 2016-10-08 NOTE — Interval H&P Note (Signed)
History and Physical Interval Note:  10/08/2016 10:30 AM  Todd Chan  has presented today for surgery, with the diagnosis of Osteoarthritis Left knee  The various methods of treatment have been discussed with the patient and family. After consideration of risks, benefits and other options for treatment, the patient has consented to  Procedure(s): LEFT TOTAL KNEE ARTHROPLASTY (Left) as a surgical intervention .  The patient's history has been reviewed, patient examined, no change in status, stable for surgery.  I have reviewed the patient's chart and labs.  Questions were answered to the patient's satisfaction.     [name]

## 2016-10-08 NOTE — Discharge Instructions (Addendum)
° °Dr. Frank [name] °Total Joint Specialist °[facility] °[address] °[place], [place] [address] °[phone] ° °TOTAL KNEE REPLACEMENT POSTOPERATIVE DIRECTIONS ° °Knee Rehabilitation, Guidelines Following Surgery  °Results after knee surgery are often greatly improved when you follow the exercise, range of motion and muscle strengthening exercises prescribed by your doctor. Safety measures are also important to protect the knee from further injury. Any time any of these exercises cause you to have increased pain or swelling in your knee joint, decrease the amount until you are comfortable again and slowly increase them. If you have problems or questions, call your caregiver or physical therapist for advice.  ° °HOME CARE INSTRUCTIONS  °Remove items at home which could result in a fall. This includes throw rugs or furniture in walking pathways.  °· ICE to the affected knee every three hours for 30 minutes at a time and then as needed for pain and swelling.  Continue to use ice on the knee for pain and swelling from surgery. You may notice swelling that will progress down to the foot and ankle.  This is normal after surgery.  Elevate the leg when you are not up walking on it.   °· Continue to use the breathing machine which will help keep your temperature down.  It is common for your temperature to cycle up and down following surgery, especially at night when you are not up moving around and exerting yourself.  The breathing machine keeps your lungs expanded and your temperature down. °· Do not place pillow under knee, focus on keeping the knee straight while resting ° °DIET °You may resume your previous home diet once your are discharged from the hospital. ° °DRESSING / WOUND CARE / SHOWERING °You may shower 3 days after surgery, but keep the wounds dry during showering.  You may use an occlusive plastic wrap (Press'n Seal for example), NO SOAKING/SUBMERGING IN THE BATHTUB.  If the  bandage gets wet, change with a clean dry gauze.  If the incision gets wet, pat the wound dry with a clean towel. °You may start showering once you are discharged home but do not submerge the incision under water. Just pat the incision dry and apply a dry gauze dressing on daily. °Change the surgical dressing daily and reapply a dry dressing each time. ° °ACTIVITY °Walk with your walker as instructed. °Use walker as long as suggested by your caregivers. °Avoid periods of inactivity such as sitting longer than an hour when not asleep. This helps prevent blood clots.  °You may resume a sexual relationship in one month or when given the OK by your doctor.  °You may return to work once you are cleared by your doctor.  °Do not drive a car for 6 weeks or until released by you surgeon.  °Do not drive while taking narcotics. ° °WEIGHT BEARING °Weight bearing as tolerated with assist device (walker, cane, etc) as directed, use it as long as suggested by your surgeon or therapist, typically at least 4-6 weeks. ° °POSTOPERATIVE CONSTIPATION PROTOCOL °Constipation - defined medically as fewer than three stools per week and severe constipation as less than one stool per week. ° °One of the most common issues patients have following surgery is constipation.  Even if you have a regular bowel pattern at home, your normal regimen is likely to be disrupted due to multiple reasons following surgery.  Combination of anesthesia, postoperative narcotics, change in appetite and fluid intake all can affect your bowels.    In order to avoid complications following surgery, here are some recommendations in order to help you during your recovery period. ° °Colace (docusate) - Pick up an over-the-counter form of Colace or another stool softener and take twice a day as long as you are requiring postoperative pain medications.  Take with a full glass of water daily.  If you experience loose stools or diarrhea, hold the colace until you stool forms  back up.  If your symptoms do not get better within 1 week or if they get worse, check with your doctor. ° °Dulcolax (bisacodyl) - Pick up over-the-counter and take as directed by the product packaging as needed to assist with the movement of your bowels.  Take with a full glass of water.  Use this product as needed if not relieved by Colace only.  ° °MiraLax (polyethylene glycol) - Pick up over-the-counter to have on hand.  MiraLax is a solution that will increase the amount of water in your bowels to assist with bowel movements.  Take as directed and can mix with a glass of water, juice, soda, coffee, or tea.  Take if you go more than two days without a movement. °Do not use MiraLax more than once per day. Call your doctor if you are still constipated or irregular after using this medication for 7 days in a row. ° °If you continue to have problems with postoperative constipation, please contact the office for further assistance and recommendations.  If you experience "the worst abdominal pain ever" or develop nausea or vomiting, please contact the office immediatly for further recommendations for treatment. ° °ITCHING ° If you experience itching with your medications, try taking only a single pain pill, or even half a pain pill at a time.  You can also use Benadryl over the counter for itching or also to help with sleep.  ° °TED HOSE STOCKINGS °Wear the elastic stockings on both legs for three weeks following surgery during the day but you may remove then at night for sleeping. ° °MEDICATIONS °See your medication summary on the “After Visit Summary” that the nursing staff will review with you prior to discharge.  You may have some home medications which will be placed on hold until you complete the course of blood thinner medication.  It is important for you to complete the blood thinner medication as prescribed by your surgeon.  Continue your approved medications as instructed at time of  discharge. ° °PRECAUTIONS °If you experience chest pain or shortness of breath - call 911 immediately for transfer to the hospital emergency department.  °If you develop a fever greater that 101 F, purulent drainage from wound, increased redness or drainage from wound, foul odor from the wound/dressing, or calf pain - CONTACT YOUR SURGEON.   °                                                °FOLLOW-UP APPOINTMENTS °Make sure you keep all of your appointments after your operation with your surgeon and caregivers. You should call the office at the above phone number and make an appointment for approximately two weeks after the date of your surgery or on the date instructed by your surgeon outlined in the "After Visit Summary". ° ° °RANGE OF MOTION AND STRENGTHENING EXERCISES  °Rehabilitation of the knee is important following a knee injury or   an operation. After just a few days of immobilization, the muscles of the thigh which control the knee become weakened and shrink (atrophy). Knee exercises are designed to build up the tone and strength of the thigh muscles and to improve knee motion. Often times heat used for twenty to thirty minutes before working out will loosen up your tissues and help with improving the range of motion but do not use heat for the first two weeks following surgery. These exercises can be done on a training (exercise) mat, on the floor, on a table or on a bed. Use what ever works the best and is most comfortable for you Knee exercises include:  °Leg Lifts - While your knee is still immobilized in a splint or cast, you can do straight leg raises. Lift the leg to 60 degrees, hold for 3 sec, and slowly lower the leg. Repeat 10-20 times 2-3 times daily. Perform this exercise against resistance later as your knee gets better.  °Quad and Hamstring Sets - Tighten up the muscle on the front of the thigh (Quad) and hold for 5-10 sec. Repeat this 10-20 times hourly. Hamstring sets are done by pushing the  foot backward against an object and holding for 5-10 sec. Repeat as with quad sets.  °· Leg Slides: Lying on your back, slowly slide your foot toward your buttocks, bending your knee up off the floor (only go as far as is comfortable). Then slowly slide your foot back down until your leg is flat on the floor again. °· Angel Wings: Lying on your back spread your legs to the side as far apart as you can without causing discomfort.  °A rehabilitation program following serious knee injuries can speed recovery and prevent re-injury in the future due to weakened muscles. Contact your doctor or a physical therapist for more information on knee rehabilitation.  ° °IF YOU ARE TRANSFERRED TO A SKILLED REHAB FACILITY °If the patient is transferred to a skilled rehab facility following release from the hospital, a list of the current medications will be sent to the facility for the patient to continue.  When discharged from the skilled rehab facility, please have the facility set up the patient's Home Health Physical Therapy prior to being released. Also, the skilled facility will be responsible for providing the patient with their medications at time of release from the facility to include their pain medication, the muscle relaxants, and their blood thinner medication. If the patient is still at the rehab facility at time of the two week follow up appointment, the skilled rehab facility will also need to assist the patient in arranging follow up appointment in our office and any transportation needs. ° °MAKE SURE YOU:  °Understand these instructions.  °Get help right away if you are not doing well or get worse.  ° ° °Pick up stool softner and laxative for home use following surgery while on pain medications. °Do not submerge incision under water. °Please use good hand washing techniques while changing dressing each day. °May shower starting three days after surgery. °Please use a clean towel to pat the incision dry following  showers. °Continue to use ice for pain and swelling after surgery. °Do not use any lotions or creams on the incision until instructed by your surgeon. ° °Take Xarelto for two and a half more weeks following discharge from the hospital, then discontinue Xarelto. °Once the patient has completed the Xarelto, they may resume the 81 mg Aspirin. ° ° ° °Information on   my medicine - XARELTO® (Rivaroxaban) ° °This medication education was reviewed with me or my healthcare representative as part of my discharge preparation.  The pharmacist that spoke with me during my hospital stay was:   ° °Why was Xarelto® prescribed for you? °Xarelto® was prescribed for you to reduce the risk of blood clots forming after orthopedic surgery. The medical term for these abnormal blood clots is venous thromboembolism (VTE). ° °What do you need to know about xarelto® ? °Take your Xarelto® ONCE DAILY at the same time every day. °You may take it either with or without food. ° °If you have difficulty swallowing the tablet whole, you may crush it and mix in applesauce just prior to taking your dose. ° °Take Xarelto® exactly as prescribed by your doctor and DO NOT stop taking Xarelto® without talking to the doctor who prescribed the medication.  Stopping without other VTE prevention medication to take the place of Xarelto® may increase your risk of developing a clot. ° °After discharge, you should have regular check-up appointments with your healthcare provider that is prescribing your Xarelto®.   ° °What do you do if you miss a dose? °If you miss a dose, take it as soon as you remember on the same day then continue your regularly scheduled once daily regimen the next day. Do not take two doses of Xarelto® on the same day.  ° °Important Safety Information °A possible side effect of Xarelto® is bleeding. You should call your healthcare provider right away if you experience any of the following: °? Bleeding from an injury or your nose that does not  stop. °? Unusual colored urine (red or dark brown) or unusual colored stools (red or black). °? Unusual bruising for unknown reasons. °? A serious fall or if you hit your head (even if there is no bleeding). ° °Some medicines may interact with Xarelto® and might increase your risk of bleeding while on Xarelto®. To help avoid this, consult your healthcare provider or pharmacist prior to using any new prescription or non-prescription medications, including herbals, vitamins, non-steroidal anti-inflammatory drugs (NSAIDs) and supplements. ° °This website has more information on Xarelto®: www.xarelto.com. ° ° °

## 2016-10-08 NOTE — Op Note (Signed)
OPERATIVE REPORT-TOTAL KNEE ARTHROPLASTY   Pre-operative diagnosis- Osteoarthritis  Left knee(s)  Post-operative diagnosis- Osteoarthritis Left knee(s)  Procedure-  Left  Total Knee Arthroplasty  Surgeon- Todd Plover. Zanyiah Posten, MD  Assistant- Arlee Muslim, PA-C   Anesthesia-  Adductor canal block and spinal  EBL-* No blood loss amount entered *   Drains Hemovac  Tourniquet time-  Total Tourniquet Time Documented: Thigh (Left) - 45 minutes Total: Thigh (Left) - 45 minutes     Complications- None  Condition-PACU - hemodynamically stable.   Brief Clinical Note   Todd Chan is a 71 y.o. year old male with end stage OA of his left knee with progressively worsening pain and dysfunction. He has constant pain, with activity and at rest and significant functional deficits with difficulties even with ADLs. He has had extensive non-op management including analgesics, injections of cortisone and viscosupplements, and home exercise program, but remains in significant pain with significant dysfunction. Radiographs show bone on bone arthritis medial and patellofemoral with massive global osteophytes.Marland Kitchen He presents now for left Total Knee Arthroplasty.     Procedure in detail---   The patient is brought into the operating room and positioned supine on the operating table. After successful administration of  Adductor canal block and spinal,   a tourniquet is placed high on the  Left thigh(s) and the lower extremity is prepped and draped in the usual sterile fashion. Time out is performed by the operating team and then the  Left lower extremity is wrapped in Esmarch, knee flexed and the tourniquet inflated to 300 mmHg.       A midline incision is made with a ten blade through the subcutaneous tissue to the level of the extensor mechanism. A fresh blade is used to make a medial parapatellar arthrotomy. Soft tissue over the proximal medial tibia is subperiosteally elevated to the joint line with a  knife and into the semimembranosus bursa with a Cobb elevator. Soft tissue over the proximal lateral tibia is elevated with attention being paid to avoiding the patellar tendon on the tibial tubercle. The patella is everted, knee flexed 90 degrees and the ACL and PCL are removed. Findings are bone on bone medial and patellofemoral with massive global osteophytes.        The drill is used to create a starting hole in the distal femur and the canal is thoroughly irrigated with sterile saline to remove the fatty contents. The 5 degree Left  valgus alignment guide is placed into the femoral canal and the distal femoral cutting block is pinned to remove 9 mm off the distal femur. Resection is made with an oscillating saw.      The tibia is subluxed forward and the menisci are removed. The extramedullary alignment guide is placed referencing proximally at the medial aspect of the tibial tubercle and distally along the second metatarsal axis and tibial crest. The block is pinned to remove 69mm off the more deficient medial  side. Resection is made with an oscillating saw. Size 9is the most appropriate size for the tibia and the proximal tibia is prepared with the modular drill and keel punch for that size.      The femoral sizing guide is placed and size 9 is most appropriate. Rotation is marked off the epicondylar axis and confirmed by creating a rectangular flexion gap at 90 degrees. The size 9 cutting block is pinned in this rotation and the anterior, posterior and chamfer cuts are made with the oscillating saw. The  intercondylar block is then placed and that cut is made.      Trial size 9 tibial component, trial size 9 posterior stabilized femur and a 10  mm posterior stabilized rotating platform insert trial is placed. Full extension is achieved with excellent varus/valgus and anterior/posterior balance throughout full range of motion. The patella is everted and thickness measured to be 29 mm. Free hand resection  is taken to 17 mm, a 41  template is placed, lug holes are drilled, trial patella is placed, and it tracks normally. Osteophytes are removed off the posterior femur with the trial in place. All trials are removed and the cut bone surfaces prepared with pulsatile lavage. Cement is mixed and once ready for implantation, the size 9 tibial implant, size  9 posterior stabilized femoral component, and the size 41 patella are cemented in place and the patella is held with the clamp. The trial insert is placed and the knee held in full extension. The Exparel (20 ml mixed with 30 ml saline) and .25% Bupivicaine, are injected into the extensor mechanism, posterior capsule, medial and lateral gutters and subcutaneous tissues.  All extruded cement is removed and once the cement is hard the permanent 10 mm posterior stabilized rotating platform insert is placed into the tibial tray.      The wound is copiously irrigated with saline solution and the extensor mechanism closed over a hemovac drain with #1 V-loc suture. The tourniquet is released for a total tourniquet time of 45  minutes. Flexion against gravity is 140 degrees and the patella tracks normally. Subcutaneous tissue is closed with 2.0 vicryl and subcuticular with running 4.0 Monocryl. The incision is cleaned and dried and steri-strips and a bulky sterile dressing are applied. The limb is placed into a knee immobilizer and the patient is awakened and transported to recovery in stable condition.      Please note that a surgical assistant was a medical necessity for this procedure in order to perform it in a safe and expeditious manner. Surgical assistant was necessary to retract the ligaments and vital neurovascular structures to prevent injury to them and also necessary for proper positioning of the limb to allow for anatomic placement of the prosthesis.   Todd Plover Jalon Blackwelder, MD    10/08/2016, 1:57 PM

## 2016-10-08 NOTE — Anesthesia Postprocedure Evaluation (Signed)
Anesthesia Post Note  Patient: [place] GARDEN  Procedure(s) Performed: Procedure(s) (LRB): LEFT TOTAL KNEE ARTHROPLASTY (Left)     Patient location during evaluation: PACU Anesthesia Type: Spinal Level of consciousness: oriented and awake and alert Pain management: pain level controlled Vital Signs Assessment: post-procedure vital signs reviewed and stable Respiratory status: spontaneous breathing, respiratory function stable and patient connected to nasal cannula oxygen Cardiovascular status: blood pressure returned to baseline and stable Postop Assessment: no headache and no backache Anesthetic complications: no    Last Vitals:  Vitals:   10/08/16 1500 10/08/16 1515  BP: 124/87 125/71  Pulse: 70 72  Resp: 17 13  Temp:      Last Pain:  Vitals:   10/08/16 1044  TempSrc: Oral    LLE Motor Response: Responds to commands (10/08/16 1515) LLE Sensation: Decreased (due to spinal) (10/08/16 1515) RLE Motor Response: Responds to commands (10/08/16 1515) RLE Sensation: Decreased (due to spinal) (10/08/16 1515) L Sensory Level: L5-Outer lower leg, top of foot, great toe (10/08/16 1515) R Sensory Level: L5-Outer lower leg, top of foot, great toe (10/08/16 1515)  Demtrius Rounds

## 2016-10-08 NOTE — Anesthesia Preprocedure Evaluation (Addendum)
Anesthesia Evaluation  Patient identified by MRN, date of birth, ID band Patient awake    Reviewed: Allergy & Precautions, NPO status , Patient's Chart, lab work & pertinent test results  Airway Mallampati: II  TM Distance: >3 FB Neck ROM: Full    Dental no notable dental hx.    Pulmonary neg pulmonary ROS, former smoker,    Pulmonary exam normal breath sounds clear to auscultation       Cardiovascular hypertension, Pt. on medications Normal cardiovascular exam Rhythm:Regular Rate:Normal     Neuro/Psych negative neurological ROS  negative psych ROS   GI/Hepatic negative GI ROS, Neg liver ROS,   Endo/Other  negative endocrine ROS  Renal/GU negative Renal ROS  negative genitourinary   Musculoskeletal negative musculoskeletal ROS (+)   Abdominal   Peds negative pediatric ROS (+)  Hematology negative hematology ROS (+)   Anesthesia Other Findings   Reproductive/Obstetrics negative OB ROS                             Anesthesia Physical Anesthesia Plan  ASA: II  Anesthesia Plan: Spinal   Post-op Pain Management:  Regional for Post-op pain   Induction:   PONV Risk Score and Plan: 1 and Ondansetron and Dexamethasone  Airway Management Planned: Nasal Cannula and Natural Airway  Additional Equipment:   Intra-op Plan:   Post-operative Plan:   Informed Consent: I have reviewed the patients History and Physical, chart, labs and discussed the procedure including the risks, benefits and alternatives for the proposed anesthesia with the patient or authorized representative who has indicated his/her understanding and acceptance.   Dental advisory given  Plan Discussed with:   Anesthesia Plan Comments: (  )        Anesthesia Quick Evaluation

## 2016-10-08 NOTE — Anesthesia Procedure Notes (Addendum)
Anesthesia Regional Block: Adductor canal block   Pre-Anesthetic Checklist: ,, timeout performed, Correct Patient, Correct Site, Correct Laterality, Correct Procedure, Correct Position, site marked, Risks and benefits discussed,  Surgical consent,  Pre-op evaluation,  At surgeon's request and post-op pain management  Laterality: Left  Prep: chloraprep       Needles:  Injection technique: Single-shot  Needle Type: Echogenic Stimulator Needle     Needle Length: 5cm  Needle Gauge: 22     Additional Needles:   Procedures: ultrasound guided, nerve stimulator,,,,,,  Narrative:  Start time: 10/08/2016 11:35 AM End time: 10/08/2016 11:38 AM Injection made incrementally with aspirations every 5 mL.  Performed by: Personally  Anesthesiologist: Benjamen Koelling  Additional Notes: Functioning IV was confirmed and monitors were applied.  A 72mm 22ga Arrow echogenic stimulator needle was used. Sterile prep and drape,hand hygiene and sterile gloves were used. Ultrasound guidance: relevant anatomy identified, needle position confirmed, local anesthetic spread visualized around nerve(s)., vascular puncture avoided.  Image printed for medical record. Negative aspiration and negative test dose prior to incremental administration of local anesthetic. The patient tolerated the procedure well.

## 2016-10-09 LAB — BASIC METABOLIC PANEL
Anion gap: 5 (ref 5–15)
BUN: 14 mg/dL (ref 6–20)
CO2: 28 mmol/L (ref 22–32)
Calcium: 8.2 mg/dL — ABNORMAL LOW (ref 8.9–10.3)
Chloride: 103 mmol/L (ref 101–111)
Creatinine, Ser: 0.81 mg/dL (ref 0.61–1.24)
GFR calc Af Amer: >60 mL/min (ref >60)
GLUCOSE: 156 mg/dL — AB (ref 65–99)
POTASSIUM: 4.3 mmol/L (ref 3.5–5.1)
Sodium: 136 mmol/L (ref 135–145)

## 2016-10-09 LAB — CBC
HCT: 31.8 % — ABNORMAL LOW (ref 39.0–52.0)
Hemoglobin: 10.9 g/dL — ABNORMAL LOW (ref 13.0–17.0)
MCH: 32.8 pg (ref 26.0–34.0)
MCHC: 34.3 g/dL (ref 30.0–36.0)
MCV: 95.8 fL (ref 78.0–100.0)
PLATELETS: 123 10*3/uL — AB (ref 150–400)
RBC: 3.32 MIL/uL — AB (ref 4.22–5.81)
RDW: 12.9 % (ref 11.5–15.5)
WBC: 11.9 10*3/uL — ABNORMAL HIGH (ref 4.0–10.5)

## 2016-10-09 MED ORDER — OXYCODONE HCL 5 MG PO TABS: 5.0000 mg | ORAL_TABLET | ORAL | 0 refills | Status: DC | PRN

## 2016-10-09 MED ORDER — RIVAROXABAN 10 MG PO TABS: 10.0000 mg | ORAL_TABLET | Freq: Every day | ORAL | 0 refills | Status: DC

## 2016-10-09 MED ORDER — TRAMADOL HCL 50 MG PO TABS: 50.0000 mg | ORAL_TABLET | Freq: Four times a day (QID) | ORAL | 0 refills | Status: DC | PRN

## 2016-10-09 MED ORDER — METHOCARBAMOL 500 MG PO TABS: 500.0000 mg | ORAL_TABLET | Freq: Four times a day (QID) | ORAL | 0 refills | Status: DC | PRN

## 2016-10-09 NOTE — Evaluation (Signed)
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 At baseline Pt is independent with ADLs and functional mobility. Pt currently requires MinA for functional mobility using RW, MaxA for LB ADLs. Pt lives with spouse who will be available 24hr to assist PRN with ADL completion. Pt will benefit from continued OT services while in acute setting to maximize Pt's safety and independence with ADLs and functional mobility prior to discharge home.     Follow Up Recommendations  No OT follow up;Supervision/Assistance - 24 hour    Equipment Recommendations  3 in 1 bedside commode (bariatric 3:1 )           Precautions / Restrictions Precautions Precautions: Fall;Knee Precaution Comments: reviewed knee precautions Required Braces or Orthoses: Knee Immobilizer - Left Restrictions Weight Bearing Restrictions: No LLE Weight Bearing: Weight bearing as tolerated      Mobility Bed Mobility Overal bed mobility: Needs Assistance Bed Mobility: Supine to Sit     Supine to sit: HOB elevated;Min assist     General bed mobility comments: close guard for safety; assist for lowering L LE to floor   Transfers Overall transfer level: Needs assistance Equipment used: Rolling walker (2 wheeled) Transfers: Sit to/from Stand Sit to Stand: Min assist         General transfer comment: VCs safety, technique, hand/LE placement. Assist to rise, stabilize, control descent.                                                ADL either performed or assessed with clinical judgement   ADL Overall ADL's : Needs assistance/impaired Eating/Feeding: Independent;Sitting   Grooming: Wash/dry hands;Min guard;Standing   Upper Body Bathing: Min guard;Sitting   Lower Body Bathing: Minimal  assistance;Sit to/from stand   Upper Body Dressing : Min guard;Sitting   Lower Body Dressing: Maximal assistance;Sit to/from stand   Toilet Transfer: Minimal assistance;Ambulation;BSC;RW Toilet Transfer Details (indicate cue type and reason): BSC over toilet  Toileting- Clothing Manipulation and Hygiene: Minimal assistance;Sit to/from stand   Tub/ Shower Transfer: Minimal assistance;Ambulation;Rolling walker;Walk-in shower   Functional mobility during ADLs: Minimal assistance;Rolling walker                           Pertinent Vitals/Pain Pain Assessment: 0-10 Pain Score: 2  Pain Location: L knee Pain Descriptors / Indicators: Aching;Sore Pain Intervention(s): Limited activity within patient's tolerance;Monitored during session;Premedicated before session;Ice applied          Extremity/Trunk Assessment Upper Extremity Assessment Upper Extremity Assessment: Overall WFL for tasks assessed   Lower Extremity Assessment Lower Extremity Assessment: Defer to PT evaluation   Cervical / Trunk Assessment Cervical / Trunk Assessment: Normal   Communication Communication Communication: No difficulties   Cognition Arousal/Alertness: Awake/alert Behavior During Therapy: WFL for tasks assessed/performed Overall Cognitive Status: Within Functional Limits for tasks assessed                                     General Comments  Home Living Family/patient expects to be discharged to:: Private residence Living Arrangements: Spouse/significant other Available Help at Discharge: Available 24 hours/day Type of Home: House Home Access: Stairs to enter [employer] of Steps: 1/2 step  Entrance Stairs-Rails: None Home Layout: Two level;Able to live on main level with bedroom/bathroom Alternate Level Stairs-Number of Steps: flight  Alternate Level Stairs-Rails: Right;Left Bathroom Shower/Tub: Occupational psychologist:  Standard     Home Equipment: Shower seat - built in;Cane - single point;Walker - 2 wheels          Prior Functioning/Environment Level of Independence: Independent                 OT Problem List: Decreased strength;Impaired balance (sitting and/or standing);Decreased activity tolerance      OT Treatment/Interventions: Self-care/ADL training;DME and/or AE instruction;Therapeutic activities;Balance training;Therapeutic exercise;Energy conservation;Patient/family education    OT Goals(Current goals can be found in the care plan section) Acute Rehab OT Goals Patient Stated Goal: regain independence OT Goal Formulation: With patient Time For Goal Achievement: 10/23/16 Potential to Achieve Goals: Good ADL Goals Pt Will Perform Grooming: with supervision;standing Pt Will Perform Lower Body Dressing: with mod assist;sit to/from stand Pt Will Transfer to Toilet: with min guard assist;ambulating;bedside commode (BSC over toilet ) Pt Will Perform Toileting - Clothing Manipulation and hygiene: with min guard assist;sit to/from stand Pt Will Perform Tub/Shower Transfer: Shower transfer;with min guard assist;ambulating;shower seat  OT Frequency: Min 2X/week                             AM-PAC PT "6 Clicks" Daily Activity     Outcome Measure Help from another person eating meals?: None Help from another person taking care of personal grooming?: A Little Help from another person toileting, which includes using toliet, bedpan, or urinal?: A Little Help from another person bathing (including washing, rinsing, drying)?: A Lot Help from another person to put on and taking off regular upper body clothing?: A Little Help from another person to put on and taking off regular lower body clothing?: A Lot 6 Click Score: 17   End of Session Equipment Utilized During Treatment: Gait belt;Rolling walker  Activity Tolerance: Patient tolerated treatment well Patient left: in chair;with  call bell/phone within reach;with nursing/sitter in room (nurse tech )  OT Visit Diagnosis: Unsteadiness on feet (R26.81);Muscle weakness (generalized) (M62.81)                Time: 5883-2549 OT Time Calculation (min): 34 min Charges:  OT General Charges $OT Visit: 1 Procedure OT Evaluation $OT Eval Low Complexity: 1 Procedure OT Treatments $Self Care/Home Management : 8-22 mins G-Codes:     Lou Cal, OT Pager [phone] 10/09/2016   Raymondo Band 10/09/2016, 11:01 AM

## 2016-10-09 NOTE — Evaluation (Signed)
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 He walked ~75 feet with a RW. Pain rated 5/10. Will follow and progress activity as tolerated.     Follow Up Recommendations Outpatient PT    Equipment Recommendations  None recommended by PT    Recommendations for Other Services       Precautions / Restrictions Precautions Precautions: Fall;Knee Required Braces or Orthoses: Knee Immobilizer - Left Restrictions Weight Bearing Restrictions: No LLE Weight Bearing: Weight bearing as tolerated      Mobility  Bed Mobility               General bed mobility comments: oob in recliner  Transfers Overall transfer level: Needs assistance Equipment used: Rolling walker (2 wheeled) Transfers: Sit to/from Stand Sit to Stand: Min assist         General transfer comment: VCs safety, technique, hand/LE placement. Assist to rise, stabilize, control descent.   Ambulation/Gait Ambulation/Gait assistance: Min assist Ambulation Distance (Feet): 75 Feet Assistive device: Rolling walker (2 wheeled) Gait Pattern/deviations: Step-to pattern;Antalgic     General Gait Details: VCs safety, technique, sequence. Assist to stabilize throughout ambulation distance.   Stairs            Wheelchair Mobility    Modified Rankin (Stroke Patients Only)       Balance                                             Pertinent Vitals/Pain Pain Assessment: 0-10 Pain Score: 5  Pain Location: L knee Pain Descriptors / Indicators: Aching;Sore Pain Intervention(s): Monitored during session;Repositioned;Ice applied    Home Living Family/patient expects to be discharged to:: Private residence Living Arrangements: Spouse/significant other Available Help at Discharge:  Available 24 hours/day Type of Home: House Home Access: Stairs to enter Entrance Stairs-Rails: None Entrance Stairs-Number of Steps: 1/2 step  Home Layout: Two level;Able to live on main level with bedroom/bathroom Home Equipment: Shower seat - built in;Cane - single point;Walker - 2 wheels      Prior Function Level of Independence: Independent               Hand Dominance        Extremity/Trunk Assessment   Upper Extremity Assessment Upper Extremity Assessment: Defer to OT evaluation    Lower Extremity Assessment Lower Extremity Assessment: Generalized weakness (s/p L TKA)    Cervical / Trunk Assessment Cervical / Trunk Assessment: Normal  Communication   Communication: No difficulties  Cognition Arousal/Alertness: Awake/alert Behavior During Therapy: WFL for tasks assessed/performed Overall Cognitive Status: Within Functional Limits for tasks assessed                                        General Comments      Exercises Total Joint Exercises Ankle Circles/Pumps: AROM;Both;10 reps;Supine Quad Sets: AROM;Both;10 reps;Supine Heel Slides: AAROM;Left;10 reps;Supine Hip ABduction/ADduction: AAROM;Left;10 reps;Supine Straight Leg Raises: AAROM;Left;10 reps;Supine Goniometric ROM: ~10-65 degrees   Assessment/Plan    PT Assessment Patient needs continued PT services  PT Problem List Decreased strength;Decreased mobility;Decreased range of motion;Decreased activity tolerance;Decreased balance;Decreased knowledge  of use of DME;Pain;Decreased knowledge of precautions       PT Treatment Interventions DME instruction;Therapeutic activities;Gait training;Therapeutic exercise;Patient/family education;Balance training;Stair training;Functional mobility training    PT Goals (Current goals can be found in the Care Plan section)  Acute Rehab PT Goals Patient Stated Goal: regain independence PT Goal Formulation: With patient Time For Goal Achievement:  10/23/16 Potential to Achieve Goals: Good    Frequency 7X/week   Barriers to discharge        Co-evaluation               AM-PAC PT "6 Clicks" Daily Activity  Outcome Measure Difficulty turning over in bed (including adjusting bedclothes, sheets and blankets)?: Total Difficulty moving from lying on back to sitting on the side of the bed? : Total Difficulty sitting down on and standing up from a chair with arms (e.g., wheelchair, bedside commode, etc,.)?: Total Help needed moving to and from a bed to chair (including a wheelchair)?: A Little Help needed walking in hospital room?: A Little Help needed climbing 3-5 steps with a railing? : A Little 6 Click Score: 12    End of Session Equipment Utilized During Treatment: Gait belt;Left knee immobilizer Activity Tolerance: Patient tolerated treatment well Patient left: in chair;with call bell/phone within reach   PT Visit Diagnosis: Muscle weakness (generalized) (M62.81);Difficulty in walking, not elsewhere classified (R26.2)    Time: 4037-5436 PT Time Calculation (min) (ACUTE ONLY): 18 min   Charges:   PT Evaluation $PT Eval Low Complexity: 1 Procedure     PT G Codes:          Weston Anna, MPT Pager: [phone]

## 2016-10-09 NOTE — Progress Notes (Addendum)
Lives with spouse in a 2 story home with 1/2 steps. No DME needs, Rx given for RW for pickup from [facility] prior to surgery and doesn't need a 3-in-1. Scheduled to start OP PT at [facility] on 10-12-16. [phone]  Update: Patient now requesting a 3n1, contacted [facility] and delivered to room.

## 2016-10-09 NOTE — Discharge Summary (Signed)
Physician Discharge Summary   Patient ID: Todd Chan MRN: [id number] DOB/AGE: 1945/11/14 71 y.o.  Admit date: 10/08/2016 Discharge date: 10/10/2016  Primary Diagnosis:  Osteoarthritis  Left knee(s) Admission Diagnoses:  Past Medical History:  Diagnosis Date  . Arthritis   . Fibromyalgia   . GERD (gastroesophageal reflux disease)   . Hypertension   . Peripheral vascular disease ([place])    bilateral edema    Discharge Diagnoses:   Principal Problem:   OA (osteoarthritis) of knee  Estimated body mass index is 37.7 kg/m as calculated from the following:   Height as of this encounter: 6' (1.829 m).   Weight as of this encounter: 126.1 kg (278 lb).  Procedure:  Procedure(s) (LRB): LEFT TOTAL KNEE ARTHROPLASTY (Left)   Consults: None  HPI: Todd Chan is a 71 y.o. year old male with end stage OA of his left knee with progressively worsening pain and dysfunction. He has constant pain, with activity and at rest and significant functional deficits with difficulties even with ADLs. He has had extensive non-op management including analgesics, injections of cortisone and viscosupplements, and home exercise program, but remains in significant pain with significant dysfunction. Radiographs show bone on bone arthritis medial and patellofemoral with massive global osteophytes.Marland Kitchen He presents now for left Total Knee Arthroplasty.   Laboratory Data: Admission on 10/08/2016  Component Date Value Ref Range Status  . Sodium 10/08/2016 140  135 - 145 mmol/L Final  . Potassium 10/08/2016 4.7  3.5 - 5.1 mmol/L Final  . Chloride 10/08/2016 103  101 - 111 mmol/L Final  . CO2 10/08/2016 30  22 - 32 mmol/L Final  . Glucose, Bld 10/08/2016 142* 65 - 99 mg/dL Final  . BUN 10/08/2016 17  6 - 20 mg/dL Final  . Creatinine, Ser 10/08/2016 0.89  0.61 - 1.24 mg/dL Final  . Calcium 10/08/2016 8.6* 8.9 - 10.3 mg/dL Final  . Total Protein 10/08/2016 6.2* 6.5 - 8.1 g/dL Final  . Albumin 10/08/2016 3.7   3.5 - 5.0 g/dL Final  . AST 10/08/2016 25  15 - 41 U/L Final  . ALT 10/08/2016 21  17 - 63 U/L Final  . Alkaline Phosphatase 10/08/2016 27* 38 - 126 U/L Final  . Total Bilirubin 10/08/2016 0.9  0.3 - 1.2 mg/dL Final  . GFR calc non Af Amer 10/08/2016 >60  >60 mL/min Final  . GFR calc Af Amer 10/08/2016 >60  >60 mL/min Final   Comment: (NOTE) The eGFR has been calculated using the CKD EPI equation. This calculation has not been validated in all clinical situations. eGFR's persistently <60 mL/min signify possible Chronic Kidney Disease.   . Anion gap 10/08/2016 7  5 - 15 Final  . Prothrombin Time 10/08/2016 13.5  11.4 - 15.2 seconds Final  . INR 10/08/2016 1.03   Final  . WBC 10/09/2016 11.9* 4.0 - 10.5 K/uL Final  . RBC 10/09/2016 3.32* 4.22 - 5.81 MIL/uL Final  . Hemoglobin 10/09/2016 10.9* 13.0 - 17.0 g/dL Final  . HCT 10/09/2016 31.8* 39.0 - 52.0 % Final  . MCV 10/09/2016 95.8  78.0 - 100.0 fL Final  . MCH 10/09/2016 32.8  26.0 - 34.0 pg Final  . MCHC 10/09/2016 34.3  30.0 - 36.0 g/dL Final  . RDW 10/09/2016 12.9  11.5 - 15.5 % Final  . Platelets 10/09/2016 123* 150 - 400 K/uL Final  . Sodium 10/09/2016 136  135 - 145 mmol/L Final  . Potassium 10/09/2016 4.3  3.5 - 5.1 mmol/L Final  .  Chloride 10/09/2016 103  101 - 111 mmol/L Final  . CO2 10/09/2016 28  22 - 32 mmol/L Final  . Glucose, Bld 10/09/2016 156* 65 - 99 mg/dL Final  . BUN 10/09/2016 14  6 - 20 mg/dL Final  . Creatinine, Ser 10/09/2016 0.81  0.61 - 1.24 mg/dL Final  . Calcium 10/09/2016 8.2* 8.9 - 10.3 mg/dL Final  . GFR calc non Af Amer 10/09/2016 >60  >60 mL/min Final  . GFR calc Af Amer 10/09/2016 >60  >60 mL/min Final   Comment: (NOTE) The eGFR has been calculated using the CKD EPI equation. This calculation has not been validated in all clinical situations. eGFR's persistently <60 mL/min signify possible Chronic Kidney Disease.   Todd Chan gap 10/09/2016 5  5 - 15 Final  Hospital Outpatient Visit on 10/01/2016    Component Date Value Ref Range Status  . ABO/RH(D) 10/01/2016 A NEG   Final  . Antibody Screen 10/01/2016 NEG   Final  . Sample Expiration 10/01/2016 10/11/2016   Final  . Extend sample reason 10/01/2016 NO TRANSFUSIONS OR PREGNANCY IN THE PAST 3 MONTHS   Final  . MRSA, PCR 10/01/2016 NEGATIVE  NEGATIVE Final  . Staphylococcus aureus 10/01/2016 POSITIVE* NEGATIVE Final   Comment:        The Xpert SA Assay (FDA approved for NASAL specimens in patients over 43 years of age), is one component of a comprehensive surveillance program.  Test performance has been validated by [facility] for patients greater than or equal to 35 year old. It is not intended to diagnose infection nor to guide or monitor treatment.   Marland Kitchen aPTT 10/01/2016 29  24 - 36 seconds Final  . WBC 10/01/2016 4.7  4.0 - 10.5 K/uL Final  . RBC 10/01/2016 4.31  4.22 - 5.81 MIL/uL Final  . Hemoglobin 10/01/2016 14.7  13.0 - 17.0 g/dL Final  . HCT 10/01/2016 42.1  39.0 - 52.0 % Final  . MCV 10/01/2016 97.7  78.0 - 100.0 fL Final  . MCH 10/01/2016 34.1* 26.0 - 34.0 pg Final  . MCHC 10/01/2016 34.9  30.0 - 36.0 g/dL Final  . RDW 10/01/2016 13.1  11.5 - 15.5 % Final  . Platelets 10/01/2016 174  150 - 400 K/uL Final  . Prothrombin Time 10/01/2016 13.3  11.4 - 15.2 seconds Final  . INR 10/01/2016 1.01   Final  . ABO/RH(D) 10/01/2016 A NEG   Final     X-Rays:No results found.  EKG: Orders placed or performed during the hospital encounter of 10/01/16  . EKG 12 lead  . EKG 12 lead     Hospital Course: Todd Chan is a 71 y.o. who was admitted to [facility]. They were brought to the operating room on 10/08/2016 and underwent Procedure(s): LEFT TOTAL KNEE ARTHROPLASTY.  Patient tolerated the procedure well and was later transferred to the recovery room and then to the orthopaedic floor for postoperative care.  They were given PO and IV analgesics for pain control following their surgery.  They were given 24 hours  of postoperative antibiotics of  Anti-infectives    Start     Dose/Rate Route Frequency Ordered Stop   10/08/16 1900  ceFAZolin (ANCEF) IVPB 2g/100 mL premix     2 g 200 mL/hr over 30 Minutes Intravenous Every 6 hours 10/08/16 1556 10/09/16 0252   10/08/16 0600  ceFAZolin (ANCEF) 3 g in dextrose 5 % 50 mL IVPB     3 g 130 mL/hr over 30  Minutes Intravenous 30 min pre-op 10/07/16 1344 10/08/16 1317     and started on DVT prophylaxis in the form of Xarelto.   PT and OT were ordered for total joint protocol.  Discharge planning consulted to help with postop disposition and equipment needs.  Patient had a good night on the evening of surgery.  They started to get up OOB with therapy on day one. Hemovac drain was pulled without difficulty.  Continued to work with therapy into day two.  Dressing was changed on day two and the incision was healing well. Patient was seen in rounds and was ready to go home on day two.  Discharge home - outpatient therapy at [place] Follow up - in 2 weeks Activity - WBAT Disposition - Home Condition Upon Discharge - Good D/C Meds - See DC Summary DVT Prophylaxis - Xarelto   Discharge Instructions    Call MD / Call 911    Complete by:  As directed    If you experience chest pain or shortness of breath, CALL 911 and be transported to the hospital emergency room.  If you develope a fever above 101 F, pus (white drainage) or increased drainage or redness at the wound, or calf pain, call your surgeon's office.   Change dressing    Complete by:  As directed    Change dressing daily with sterile 4 x 4 inch gauze dressing and apply TED hose. Do not submerge the incision under water.   Constipation Prevention    Complete by:  As directed    Drink plenty of fluids.  Prune juice may be helpful.  You may use a stool softener, such as Colace (over the counter) 100 mg twice a day.  Use MiraLax (over the counter) for constipation as needed.   Diet  - low sodium heart healthy    Complete by:  As directed    Discharge instructions    Complete by:  As directed    Take Xarelto for two and a half more weeks, then discontinue Xarelto. Once the patient has completed the Xarelto, they may resume the 81 mg Aspirin.   Pick up stool softner and laxative for home use following surgery while on pain medications. Do not submerge incision under water. Please use good hand washing techniques while changing dressing each day. May shower starting three days after surgery. Please use a clean towel to pat the incision dry following showers. Continue to use ice for pain and swelling after surgery. Do not use any lotions or creams on the incision until instructed by your surgeon.  Wear both TED hose on both legs during the day every day for three weeks, but may remove the TED hose at night at home.  Postoperative Constipation Protocol  Constipation - defined medically as fewer than three stools per week and severe constipation as less than one stool per week.  One of the most common issues patients have following surgery is constipation.  Even if you have a regular bowel pattern at home, your normal regimen is likely to be disrupted due to multiple reasons following surgery.  Combination of anesthesia, postoperative narcotics, change in appetite and fluid intake all can affect your bowels.  In order to avoid complications following surgery, here are some recommendations in order to help you during your recovery period.  Colace (docusate) - Pick up an over-the-counter form of Colace or another stool softener and take twice a day as long as you  are requiring postoperative pain medications.  Take with a full glass of water daily.  If you experience loose stools or diarrhea, hold the colace until you stool forms back up.  If your symptoms do not get better within 1 week or if they get worse, check with your doctor.  Dulcolax (bisacodyl) - Pick up  over-the-counter and take as directed by the product packaging as needed to assist with the movement of your bowels.  Take with a full glass of water.  Use this product as needed if not relieved by Colace only.   MiraLax (polyethylene glycol) - Pick up over-the-counter to have on hand.  MiraLax is a solution that will increase the amount of water in your bowels to assist with bowel movements.  Take as directed and can mix with a glass of water, juice, soda, coffee, or tea.  Take if you go more than two days without a movement. Do not use MiraLax more than once per day. Call your doctor if you are still constipated or irregular after using this medication for 7 days in a row.  If you continue to have problems with postoperative constipation, please contact the office for further assistance and recommendations.  If you experience "the worst abdominal pain ever" or develop nausea or vomiting, please contact the office immediatly for further recommendations for treatment.   Do not put a pillow under the knee. Place it under the heel.    Complete by:  As directed    Do not sit on low chairs, stoools or toilet seats, as it may be difficult to get up from low surfaces    Complete by:  As directed    Driving restrictions    Complete by:  As directed    No driving until released by the physician.   Increase activity slowly as tolerated    Complete by:  As directed    Lifting restrictions    Complete by:  As directed    No lifting until released by the physician.   Patient may shower    Complete by:  As directed    You may shower without a dressing once there is no drainage.  Do not wash over the wound.  If drainage remains, do not shower until drainage stops.   TED hose    Complete by:  As directed    Use stockings (TED hose) for 3 weeks on both leg(s).  You may remove them at night for sleeping.   Weight bearing as tolerated    Complete by:  As directed    Laterality:  left   Extremity:  Lower       Allergies as of 10/09/2016   No Known Allergies     Medication List    STOP taking these medications   ALEVE 220 MG tablet Generic drug:  naproxen sodium   aspirin EC 81 MG tablet   cholecalciferol 1000 units tablet Commonly known as:  VITAMIN D   Fish Oil 1200 MG Caps   multivitamin with minerals Tabs tablet     TAKE these medications   lisinopril 10 MG tablet Commonly known as:  PRINIVIL,ZESTRIL Take 10 mg by mouth daily.   methocarbamol 500 MG tablet Commonly known as:  ROBAXIN Take 1 tablet (500 mg total) by mouth every 6 (six) hours as needed for muscle spasms.   oxyCODONE 5 MG immediate release tablet Commonly known as:  Oxy IR/ROXICODONE Take 1-2 tablets (5-10 mg total) by mouth every 4 (four) hours  as needed for moderate pain or severe pain.   rivaroxaban 10 MG Tabs tablet Commonly known as:  XARELTO Take 1 tablet (10 mg total) by mouth daily with breakfast. Take Xarelto for two and a half more weeks following discharge from the hospital, then discontinue Xarelto. Once the patient has completed the Xarelto, they may resume the 81 mg Aspirin. Start taking on:  10/10/2016   traMADol 50 MG tablet Commonly known as:  ULTRAM Take 1-2 tablets (50-100 mg total) by mouth every 6 (six) hours as needed for moderate pain.            Durable Medical Equipment        Start     Ordered   10/09/16 [phone]  For home use only DME 3 n 1  Once     10/09/16 0940     Follow-up Information    Gaynelle Arabian, MD. Schedule an appointment as soon as possible for a visit on 10/23/2016.   Specialty:  Orthopedic Surgery Contact information: [address] [place] [address] [phone]           Signed: Arlee Muslim, PA-C Orthopaedic Surgery 10/09/2016, 10:29 PM

## 2016-10-09 NOTE — Progress Notes (Signed)
Physical Therapy Treatment Patient Details Name: Todd Chan MRN: [id number] DOB: 1945/09/06 Today's Date: 10/09/2016    History of Present Illness 71 yo male s/p L TKA 10/08/16    PT Comments    Progressing with mobility. Improved mobility this pm. Plan is for d/c home on tomorrow.    Follow Up Recommendations  Outpatient PT     Equipment Recommendations  None recommended by PT    Recommendations for Other Services       Precautions / Restrictions Precautions Precautions: Fall;Knee Precaution Comments: reviewed knee precautions Required Braces or Orthoses: Knee Immobilizer - Left Knee Immobilizer - Left: Discontinue once straight leg raise with < 10 degree lag Restrictions Weight Bearing Restrictions: No LLE Weight Bearing: Weight bearing as tolerated    Mobility  Bed Mobility Overal bed mobility: Needs Assistance Bed Mobility: Sit to Supine      Sit to supine: Min assist   General bed mobility comments: Assist for L LE.  Transfers Overall transfer level: Needs assistance Equipment used: Rolling walker (2 wheeled) Transfers: Sit to/from Stand Sit to Stand: Min guard         General transfer comment: Close guard for safety. VCs safety, technique, hand/LE placement. Increased time.   Ambulation/Gait Ambulation/Gait assistance: Min guard Ambulation Distance (Feet): 125 Feet Assistive device: Rolling walker (2 wheeled) Gait Pattern/deviations: Step-to pattern;Step-through pattern;Decreased stride length     General Gait Details: VCs safety, technique, sequence. Close guard for safety. Pt is beginning to use a step through pattern   Stairs            Wheelchair Mobility    Modified Rankin (Stroke Patients Only)       Balance                                            Cognition Arousal/Alertness: Awake/alert Behavior During Therapy: WFL for tasks assessed/performed Overall Cognitive Status: Within Functional Limits  for tasks assessed                                        Exercises      General Comments        Pertinent Vitals/Pain Pain Assessment: 0-10 Pain Score: 5  Pain Location: L knee Pain Descriptors / Indicators: Aching;Sore Pain Intervention(s): Monitored during session;Repositioned    Home Living Family/patient expects to be discharged to:: Private residence Living Arrangements: Spouse/significant other Available Help at Discharge: Available 24 hours/day Type of Home: House Home Access: Stairs to enter Entrance Stairs-Rails: None Home Layout: Two level;Able to live on main level with bedroom/bathroom Home Equipment: Shower seat - built in;Cane - single point;Walker - 2 wheels      Prior Function Level of Independence: Independent          PT Goals (current goals can now be found in the care plan section) Acute Rehab PT Goals Patient Stated Goal: regain independence Progress towards PT goals: Progressing toward goals    Frequency    7X/week      PT Plan Current plan remains appropriate    Co-evaluation              AM-PAC PT "6 Clicks" Daily Activity  Outcome Measure  Difficulty turning over in bed (including adjusting bedclothes, sheets and blankets)?: Total Difficulty moving  from lying on back to sitting on the side of the bed? : Total Difficulty sitting down on and standing up from a chair with arms (e.g., wheelchair, bedside commode, etc,.)?: A Lot Help needed moving to and from a bed to chair (including a wheelchair)?: A Little Help needed walking in hospital room?: A Little Help needed climbing 3-5 steps with a railing? : A Little 6 Click Score: 13    End of Session Equipment Utilized During Treatment: Gait belt;Left knee immobilizer Activity Tolerance: Patient tolerated treatment well Patient left: in bed;with call bell/phone within reach   PT Visit Diagnosis: Muscle weakness (generalized) (M62.81);Difficulty in walking, not  elsewhere classified (R26.2)     Time: 8251-8984 PT Time Calculation (min) (ACUTE ONLY): 10 min  Charges:  $Gait Training: 8-22 mins                    G Codes:          Weston Anna, MPT Pager: [phone]

## 2016-10-09 NOTE — Progress Notes (Signed)
   Subjective: 1 Day Post-Op Procedure(s) (LRB): LEFT TOTAL KNEE ARTHROPLASTY (Left) Patient reports pain as mild.   Patient seen in rounds with Dr. [name]. Patient is well, and has had no acute complaints or problems We will start therapy today.  Plan is to go Home after hospital stay.  Objective: Vital signs in last 24 hours: Temp:  [97 F (36.1 C)-98.4 F (36.9 C)] 97.8 F (36.6 C) (07/10 0600) Pulse Rate:  [65-99] 77 (07/10 0600) Resp:  [7-18] 18 (07/10 0600) BP: (124-164)/(71-106) 124/73 (07/10 0600) SpO2:  [94 %-100 %] 98 % (07/10 0600) Weight:  [126.1 kg (278 lb)] 126.1 kg (278 lb) (07/09 1059)  Intake/Output from previous day:  Intake/Output Summary (Last 24 hours) at 10/09/16 0811 Last data filed at 10/09/16 0601  Gross per 24 hour  Intake             4240 ml  Output             2940 ml  Net             1300 ml    Intake/Output this shift: No intake/output data recorded.  Labs:  Recent Labs  10/09/16 0535  HGB 10.9*    Recent Labs  10/09/16 0535  WBC 11.9*  RBC 3.32*  HCT 31.8*  PLT 123*    Recent Labs  10/08/16 1606 10/09/16 0535  NA 140 136  K 4.7 4.3  CL 103 103  CO2 30 28  BUN 17 14  CREATININE 0.89 0.81  GLUCOSE 142* 156*  CALCIUM 8.6* 8.2*    Recent Labs  10/08/16 1606  INR 1.03    EXAM General - Patient is Alert, Appropriate and Oriented Extremity - Neurovascular intact Sensation intact distally Dressing - dressing C/D/I Motor Function - intact, moving foot and toes well on exam.  Hemovac pulled without difficulty.  Past Medical History:  Diagnosis Date  . Arthritis   . Fibromyalgia   . GERD (gastroesophageal reflux disease)   . Hypertension   . Peripheral vascular disease ([place])    bilateral edema     Assessment/Plan: 1 Day Post-Op Procedure(s) (LRB): LEFT TOTAL KNEE ARTHROPLASTY (Left) Principal Problem:   OA (osteoarthritis) of knee  Estimated body mass index is 37.7 kg/m as calculated from the  following:   Height as of this encounter: 6' (1.829 m).   Weight as of this encounter: 126.1 kg (278 lb). Advance diet Up with therapy Plan for discharge tomorrow Discharge home - outpatient therapy at [facility]  DVT Prophylaxis - [place] as tolerated to left leg D/C O2 and Pulse OX and try on Room Air  Arlee Muslim, PA-C Orthopaedic Surgery 10/09/2016, 8:11 AM

## 2016-10-10 LAB — BASIC METABOLIC PANEL
ANION GAP: 9 (ref 5–15)
BUN: 17 mg/dL (ref 6–20)
CO2: 27 mmol/L (ref 22–32)
Calcium: 8.5 mg/dL — ABNORMAL LOW (ref 8.9–10.3)
Chloride: 101 mmol/L (ref 101–111)
Creatinine, Ser: 0.96 mg/dL (ref 0.61–1.24)
Glucose, Bld: 128 mg/dL — ABNORMAL HIGH (ref 65–99)
POTASSIUM: 4.1 mmol/L (ref 3.5–5.1)
Sodium: 137 mmol/L (ref 135–145)

## 2016-10-10 LAB — CBC
HEMATOCRIT: 25.4 % — AB (ref 39.0–52.0)
Hemoglobin: 9 g/dL — ABNORMAL LOW (ref 13.0–17.0)
MCH: 34.1 pg — ABNORMAL HIGH (ref 26.0–34.0)
MCHC: 35.4 g/dL (ref 30.0–36.0)
MCV: 96.2 fL (ref 78.0–100.0)
Platelets: 114 10*3/uL — ABNORMAL LOW (ref 150–400)
RBC: 2.64 MIL/uL — AB (ref 4.22–5.81)
RDW: 13 % (ref 11.5–15.5)
WBC: 10.9 10*3/uL — AB (ref 4.0–10.5)

## 2016-10-10 NOTE — Progress Notes (Signed)
   Subjective: 2 Days Post-Op Procedure(s) (LRB): LEFT TOTAL KNEE ARTHROPLASTY (Left) Patient reports pain as mild.   Patient seen in rounds for Dr. [name]. Patient is well, but has had some minor complaints of pain in the knee, requiring pain medications Patient is ready to go home  Objective: Vital signs in last 24 hours: Temp:  [98.5 F (36.9 C)-99.4 F (37.4 C)] 99.4 F (37.4 C) (07/11 0517) Pulse Rate:  [81-104] 104 (07/11 0517) Resp:  [17-18] 18 (07/11 0517) BP: (126-150)/(70-85) 150/82 (07/11 0517) SpO2:  [94 %-97 %] 97 % (07/11 0517)  Intake/Output from previous day:  Intake/Output Summary (Last 24 hours) at 10/10/16 0819 Last data filed at 10/10/16 0518  Gross per 24 hour  Intake          1306.17 ml  Output             2250 ml  Net          -943.83 ml    Intake/Output this shift: No intake/output data recorded.  Labs:  Recent Labs  10/09/16 0535 10/10/16 0527  HGB 10.9* 9.0*    Recent Labs  10/09/16 0535 10/10/16 0527  WBC 11.9* 10.9*  RBC 3.32* 2.64*  HCT 31.8* 25.4*  PLT 123* 114*    Recent Labs  10/09/16 0535 10/10/16 0527  NA 136 137  K 4.3 4.1  CL 103 101  CO2 28 27  BUN 14 17  CREATININE 0.81 0.96  GLUCOSE 156* 128*  CALCIUM 8.2* 8.5*    Recent Labs  10/08/16 1606  INR 1.03    EXAM: General - Patient is Alert and Appropriate Extremity - Neurovascular intact Sensation intact distally Intact pulses distally Dorsiflexion/Plantar flexion intact Incision - clean, dry Motor Function - intact, moving foot and toes well on exam.   Assessment/Plan: 2 Days Post-Op Procedure(s) (LRB): LEFT TOTAL KNEE ARTHROPLASTY (Left) Procedure(s) (LRB): LEFT TOTAL KNEE ARTHROPLASTY (Left) Past Medical History:  Diagnosis Date  . Arthritis   . Fibromyalgia   . GERD (gastroesophageal reflux disease)   . Hypertension   . Peripheral vascular disease ([facility])    bilateral edema    Principal Problem:   OA (osteoarthritis) of  knee  Estimated body mass index is 37.7 kg/m as calculated from the following:   Height as of this encounter: 6' (1.829 m).   Weight as of this encounter: 126.1 kg (278 lb). Discharge home - outpatient therapy at [place] Follow up - in 2 weeks Activity - WBAT Disposition - Home Condition Upon Discharge - Good D/C Meds - See DC Summary DVT Prophylaxis - Xarelto  Arlee Muslim, PA-C Orthopaedic Surgery 10/10/2016, 8:19 AM

## 2016-10-10 NOTE — Progress Notes (Signed)
Physical Therapy Treatment Patient Details Name: Todd Chan MRN: [id number] DOB: June 25, 1945 Today's Date: 10/10/2016    History of Present Illness 71 yo male s/p L TKA 10/08/16    PT Comments    Progressing with mobility. Reviewed/practiced exercises, gait training, and stair negotiation. Discussed car transfer. Issued HEP for pt to perform 2x/day until he begins OP PT. All education completed. Ready to d/c from PT standpoint-made RN aware.     Follow Up Recommendations  Outpatient PT     Equipment Recommendations  None recommended by PT    Recommendations for Other Services       Precautions / Restrictions Precautions Precautions: Fall;Knee Precaution Comments: reviewed knee precautions Required Braces or Orthoses: Knee Immobilizer - Left Knee Immobilizer - Left: Discontinue once straight leg raise with < 10 degree lag Restrictions Weight Bearing Restrictions: No LLE Weight Bearing: Weight bearing as tolerated    Mobility  Bed Mobility Overal bed mobility: Needs Assistance Bed Mobility: Supine to Sit     Supine to sit: Min assist;HOB elevated     General bed mobility comments: Assist for L LE.  Transfers Overall transfer level: Needs assistance Equipment used: Rolling walker (2 wheeled) Transfers: Sit to/from Stand Sit to Stand: Min assist         General transfer comment: Assist to steady. VCs safety, technique, hand/LE placement. Repeated  and practiced stand to sit due to pt's poor technique.   Ambulation/Gait Ambulation/Gait assistance: Min guard Ambulation Distance (Feet): 100 Feet Assistive device: Rolling walker (2 wheeled) Gait Pattern/deviations: Step-to pattern;Decreased stride length     General Gait Details: VCs safety, technique, sequence. Close guard for safety.    Stairs Stairs: Yes   Stair Management: Step to pattern;Forwards;With walker Number of Stairs: 1 General stair comments: VCS safety, technique, sequence. Close guard  for safety. Wife present to observe.   Wheelchair Mobility    Modified Rankin (Stroke Patients Only)       Balance                                            Cognition Arousal/Alertness: Awake/alert Behavior During Therapy: WFL for tasks assessed/performed Overall Cognitive Status: Within Functional Limits for tasks assessed                                        Exercises      General Comments        Pertinent Vitals/Pain Pain Assessment: 0-10 Pain Score: 7  Pain Location: L thigh Pain Descriptors / Indicators: Aching;Sore Pain Intervention(s): Monitored during session;Repositioned;Ice applied    Home Living                      Prior Function            PT Goals (current goals can now be found in the care plan section) Progress towards PT goals: Progressing toward goals    Frequency    7X/week      PT Plan Current plan remains appropriate    Co-evaluation              AM-PAC PT "6 Clicks" Daily Activity  Outcome Measure  Difficulty turning over in bed (including adjusting bedclothes, sheets and blankets)?: Total Difficulty moving from lying on back  to sitting on the side of the bed? : Total Difficulty sitting down on and standing up from a chair with arms (e.g., wheelchair, bedside commode, etc,.)?: A Lot Help needed moving to and from a bed to chair (including a wheelchair)?: A Little Help needed walking in hospital room?: A Little Help needed climbing 3-5 steps with a railing? : A Little 6 Click Score: 13    End of Session Equipment Utilized During Treatment: Gait belt;Left knee immobilizer Activity Tolerance: Patient tolerated treatment well Patient left: in chair;with call bell/phone within reach;with family/visitor present   PT Visit Diagnosis: Muscle weakness (generalized) (M62.81);Difficulty in walking, not elsewhere classified (R26.2)     Time: 8590-9311 PT Time Calculation (min)  (ACUTE ONLY): 27 min  Charges:  $Gait Training: 8-22 mins $Therapeutic Exercise: 8-22 mins                    G Codes:         Weston Anna, MPT Pager: [phone]

## 2016-10-10 NOTE — Progress Notes (Signed)
OT Cancellation Note  Patient Details Name: JEVEN TOPPER MRN: [id number] DOB: [birth year]   Cancelled Treatment:    Reason Eval/Treat Not Completed: Other (comment). Pt verbalizes sequence for shower transfer:  He only has a 1" ledge. He has no further questions for OT.Will sign off.  Sindi Beckworth 10/10/2016, 10:40 AM  Lesle Chris, OTR/L [phone] 10/10/2016

## 2016-10-12 DIAGNOSIS — M1712 Unilateral primary osteoarthritis, left knee: Secondary | ICD-10-CM | POA: Diagnosis not present

## 2016-10-16 ENCOUNTER — Other Ambulatory Visit (HOSPITAL_COMMUNITY): Payer: Self-pay | Admitting: Orthopedic Surgery

## 2016-10-16 DIAGNOSIS — R609 Edema, unspecified: Secondary | ICD-10-CM

## 2016-10-17 ENCOUNTER — Ambulatory Visit (HOSPITAL_COMMUNITY)
Admission: RE | Admit: 2016-10-17 | Discharge: 2016-10-17 | Disposition: A | Discharge status: Home / Self Care | Payer: Medicare Other | Source: Ambulatory Visit | Attending: Cardiology | Admitting: Cardiology

## 2016-10-17 DIAGNOSIS — Z87891 Personal history of nicotine dependence: Secondary | ICD-10-CM | POA: Insufficient documentation

## 2016-10-17 DIAGNOSIS — R609 Edema, unspecified: Secondary | ICD-10-CM

## 2016-10-17 DIAGNOSIS — M25452 Effusion, left hip: Secondary | ICD-10-CM | POA: Insufficient documentation

## 2016-10-17 DIAGNOSIS — Z96652 Presence of left artificial knee joint: Secondary | ICD-10-CM | POA: Insufficient documentation

## 2016-10-17 DIAGNOSIS — I10 Essential (primary) hypertension: Secondary | ICD-10-CM | POA: Diagnosis not present

## 2016-10-18 DIAGNOSIS — M1712 Unilateral primary osteoarthritis, left knee: Secondary | ICD-10-CM | POA: Diagnosis not present

## 2016-10-22 DIAGNOSIS — M1712 Unilateral primary osteoarthritis, left knee: Secondary | ICD-10-CM | POA: Diagnosis not present

## 2016-10-23 DIAGNOSIS — M1712 Unilateral primary osteoarthritis, left knee: Secondary | ICD-10-CM | POA: Diagnosis not present

## 2016-10-23 DIAGNOSIS — Z471 Aftercare following joint replacement surgery: Secondary | ICD-10-CM | POA: Diagnosis not present

## 2016-10-24 DIAGNOSIS — M1712 Unilateral primary osteoarthritis, left knee: Secondary | ICD-10-CM | POA: Diagnosis not present

## 2016-10-26 DIAGNOSIS — M1712 Unilateral primary osteoarthritis, left knee: Secondary | ICD-10-CM | POA: Diagnosis not present

## 2016-10-29 DIAGNOSIS — M1712 Unilateral primary osteoarthritis, left knee: Secondary | ICD-10-CM | POA: Diagnosis not present

## 2016-10-31 DIAGNOSIS — M1712 Unilateral primary osteoarthritis, left knee: Secondary | ICD-10-CM | POA: Diagnosis not present

## 2016-11-02 DIAGNOSIS — M1712 Unilateral primary osteoarthritis, left knee: Secondary | ICD-10-CM | POA: Diagnosis not present

## 2016-11-05 DIAGNOSIS — M1712 Unilateral primary osteoarthritis, left knee: Secondary | ICD-10-CM | POA: Diagnosis not present

## 2016-11-09 DIAGNOSIS — M1712 Unilateral primary osteoarthritis, left knee: Secondary | ICD-10-CM | POA: Diagnosis not present

## 2016-11-12 DIAGNOSIS — M1712 Unilateral primary osteoarthritis, left knee: Secondary | ICD-10-CM | POA: Diagnosis not present

## 2016-11-15 DIAGNOSIS — M1712 Unilateral primary osteoarthritis, left knee: Secondary | ICD-10-CM | POA: Diagnosis not present

## 2016-11-19 DIAGNOSIS — M1712 Unilateral primary osteoarthritis, left knee: Secondary | ICD-10-CM | POA: Diagnosis not present

## 2016-11-20 DIAGNOSIS — Z471 Aftercare following joint replacement surgery: Secondary | ICD-10-CM | POA: Diagnosis not present

## 2016-11-20 DIAGNOSIS — Z96652 Presence of left artificial knee joint: Secondary | ICD-10-CM | POA: Diagnosis not present

## 2016-11-22 DIAGNOSIS — M1712 Unilateral primary osteoarthritis, left knee: Secondary | ICD-10-CM | POA: Diagnosis not present

## 2016-11-26 DIAGNOSIS — M1712 Unilateral primary osteoarthritis, left knee: Secondary | ICD-10-CM | POA: Diagnosis not present

## 2016-11-29 DIAGNOSIS — M1712 Unilateral primary osteoarthritis, left knee: Secondary | ICD-10-CM | POA: Diagnosis not present

## 2016-12-05 DIAGNOSIS — M1712 Unilateral primary osteoarthritis, left knee: Secondary | ICD-10-CM | POA: Diagnosis not present

## 2016-12-07 DIAGNOSIS — M1712 Unilateral primary osteoarthritis, left knee: Secondary | ICD-10-CM | POA: Diagnosis not present

## 2016-12-17 DIAGNOSIS — Z23 Encounter for immunization: Secondary | ICD-10-CM | POA: Diagnosis not present

## 2016-12-25 DIAGNOSIS — M1711 Unilateral primary osteoarthritis, right knee: Secondary | ICD-10-CM | POA: Diagnosis not present

## 2016-12-25 DIAGNOSIS — M1712 Unilateral primary osteoarthritis, left knee: Secondary | ICD-10-CM | POA: Diagnosis not present

## 2017-03-08 ENCOUNTER — Emergency Department (HOSPITAL_BASED_OUTPATIENT_CLINIC_OR_DEPARTMENT_OTHER)
Admission: EM | Admit: 2017-03-08 | Discharge: 2017-03-09 | Disposition: A | Discharge status: Home / Self Care | Payer: Medicare Other | Attending: Emergency Medicine | Admitting: Emergency Medicine

## 2017-03-08 ENCOUNTER — Encounter (HOSPITAL_BASED_OUTPATIENT_CLINIC_OR_DEPARTMENT_OTHER): Payer: Self-pay | Admitting: *Deleted

## 2017-03-08 ENCOUNTER — Other Ambulatory Visit: Payer: Self-pay

## 2017-03-08 ENCOUNTER — Emergency Department (HOSPITAL_BASED_OUTPATIENT_CLINIC_OR_DEPARTMENT_OTHER): Payer: Medicare Other

## 2017-03-08 DIAGNOSIS — I10 Essential (primary) hypertension: Secondary | ICD-10-CM | POA: Insufficient documentation

## 2017-03-08 DIAGNOSIS — R072 Precordial pain: Secondary | ICD-10-CM

## 2017-03-08 DIAGNOSIS — R069 Unspecified abnormalities of breathing: Secondary | ICD-10-CM | POA: Insufficient documentation

## 2017-03-08 DIAGNOSIS — R05 Cough: Secondary | ICD-10-CM | POA: Diagnosis not present

## 2017-03-08 DIAGNOSIS — R030 Elevated blood-pressure reading, without diagnosis of hypertension: Secondary | ICD-10-CM | POA: Diagnosis not present

## 2017-03-08 DIAGNOSIS — R0609 Other forms of dyspnea: Secondary | ICD-10-CM

## 2017-03-08 DIAGNOSIS — R0602 Shortness of breath: Secondary | ICD-10-CM | POA: Diagnosis not present

## 2017-03-08 DIAGNOSIS — R079 Chest pain, unspecified: Secondary | ICD-10-CM | POA: Diagnosis not present

## 2017-03-08 DIAGNOSIS — Z87891 Personal history of nicotine dependence: Secondary | ICD-10-CM | POA: Diagnosis not present

## 2017-03-08 LAB — CBC WITH DIFFERENTIAL/PLATELET
BASOS ABS: 0 10*3/uL (ref 0.0–0.1)
BASOS PCT: 1 %
EOS ABS: 0.1 10*3/uL (ref 0.0–0.7)
Eosinophils Relative: 1 %
HEMATOCRIT: 41.2 % (ref 39.0–52.0)
HEMOGLOBIN: 14.2 g/dL (ref 13.0–17.0)
Lymphocytes Relative: 22 %
Lymphs Abs: 1.5 10*3/uL (ref 0.7–4.0)
MCH: 33.1 pg (ref 26.0–34.0)
MCHC: 34.5 g/dL (ref 30.0–36.0)
MCV: 96 fL (ref 78.0–100.0)
Monocytes Absolute: 0.8 10*3/uL (ref 0.1–1.0)
Monocytes Relative: 12 %
NEUTROS ABS: 4.4 10*3/uL (ref 1.7–7.7)
NEUTROS PCT: 64 %
Platelets: 202 10*3/uL (ref 150–400)
RBC: 4.29 MIL/uL (ref 4.22–5.81)
RDW: 14.6 % (ref 11.5–15.5)
WBC: 6.7 10*3/uL (ref 4.0–10.5)

## 2017-03-08 LAB — COMPREHENSIVE METABOLIC PANEL
ALBUMIN: 4.2 g/dL (ref 3.5–5.0)
ALK PHOS: 40 U/L (ref 38–126)
ALT: 25 U/L (ref 17–63)
ANION GAP: 7 (ref 5–15)
AST: 30 U/L (ref 15–41)
BILIRUBIN TOTAL: 1.2 mg/dL (ref 0.3–1.2)
BUN: 21 mg/dL — AB (ref 6–20)
CALCIUM: 9 mg/dL (ref 8.9–10.3)
CO2: 26 mmol/L (ref 22–32)
Chloride: 106 mmol/L (ref 101–111)
Creatinine, Ser: 0.84 mg/dL (ref 0.61–1.24)
GFR calc Af Amer: >60 mL/min (ref >60)
GFR calc non Af Amer: >60 mL/min (ref >60)
GLUCOSE: 118 mg/dL — AB (ref 65–99)
Potassium: 4 mmol/L (ref 3.5–5.1)
SODIUM: 139 mmol/L (ref 135–145)
TOTAL PROTEIN: 7.4 g/dL (ref 6.5–8.1)

## 2017-03-08 LAB — BRAIN NATRIURETIC PEPTIDE: B Natriuretic Peptide: 18 pg/mL (ref 0.0–100.0)

## 2017-03-08 LAB — TROPONIN I
Troponin I: <0.03 ng/mL (ref <0.03)
Troponin I: <0.03 ng/mL (ref <0.03)

## 2017-03-08 LAB — D-DIMER, QUANTITATIVE (NOT AT ARMC): D DIMER QUANT: 2.17 ug{FEU}/mL — AB (ref 0.00–0.50)

## 2017-03-08 MED ORDER — IOPAMIDOL (ISOVUE-370) INJECTION 76%
100.0000 mL | Freq: Once | INTRAVENOUS | Status: AC | PRN
  Administered 2017-03-08: 100 mL via INTRAVENOUS

## 2017-03-08 NOTE — ED Provider Notes (Signed)
Emergency Department Provider Note   I have reviewed the triage vital signs and the nursing notes.   HISTORY  Chief Complaint Shortness of Breath   HPI ABDUL BEIRNE is a 71 y.o. male with PMH of HTN, GERD, and PVD presents to the ED for evaluation of chest heaviness and shortness of breath.  His chest heaviness has been intermittent and occurring over the last 2 days.  He describes it only with deep breathing rest or exertional symptoms.  He has chronic lower extremity swelling with increased swelling in the left leg after knee replacement surgery in July of this year.  No fevers, chills, productive cough.  He is been compliant with his medications.  States he had increased belching and coughing from his baseline symptoms.  No history of heart attack, stroke, or blood clots to the lungs. Also describes a tight feeling in his right posterior chest that feels similar to muscle pain he has had in the past but not improving significantly with massage.    Past Medical History:  Diagnosis Date  . Arthritis   . Fibromyalgia   . GERD (gastroesophageal reflux disease)   . Hypertension   . Peripheral vascular disease ([place])    bilateral edema     Patient Active Problem List   Diagnosis Date Noted  . OA (osteoarthritis) of knee 10/08/2016    Past Surgical History:  Procedure Laterality Date  . KNEE ARTHROSCOPY Left 6-7 years ago  . TONSILLECTOMY    . TOTAL KNEE ARTHROPLASTY Left 10/08/2016   Procedure: LEFT TOTAL KNEE ARTHROPLASTY;  Surgeon: Gaynelle Arabian, MD;  Location: [facility];  Service: Orthopedics;  Laterality: Left;    Current Outpatient Rx  . Order #: [id number] Class: Historical Med  . Order #: [id number] Class: Print  . Order #: [id number] Class: Print  . Order #: [id number] Class: Print  . Order #: [id number] Class: Print    Allergies Patient has no known allergies.  History reviewed. No pertinent family history.  Social History Social History   Tobacco Use  . Smoking  status: Former Research scientist (life sciences)  . Smokeless tobacco: Never Used  . Tobacco comment: quit 30 yeara ago   Substance Use Topics  . Alcohol use: Yes    Alcohol/week: 5.4 oz    Types: 7 Glasses of wine, 2 Shots of liquor per week  . Drug use: No    Review of Systems  Constitutional: No fever/chills Eyes: No visual changes. ENT: No sore throat. Cardiovascular: Positive chest pain. Respiratory: Positive shortness of breath. Gastrointestinal: No abdominal pain.  No nausea, no vomiting.  No diarrhea.  No constipation. Genitourinary: Negative for dysuria. Musculoskeletal: Negative for back pain. Skin: Negative for rash. Neurological: Negative for headaches, focal weakness or numbness.  10-point ROS otherwise negative.  ____________________________________________   PHYSICAL EXAM:  VITAL SIGNS: ED Triage Vitals [03/08/17 2050]  Enc Vitals Group     BP (!) 177/101     Pulse Rate (!) 107     Resp (!) 22     Temp 98.3 F (36.8 C)     Temp Source Oral     SpO2 99 %   Constitutional: Alert and oriented. Well appearing and in no acute distress. Eyes: Conjunctivae are normal.  Head: Atraumatic. Nose: No congestion/rhinnorhea. Mouth/Throat: Mucous membranes are moist.  Neck: No stridor.  Cardiovascular: Tachycardia. Good peripheral circulation. Grossly normal heart sounds.   Respiratory: Normal respiratory effort.  No retractions. Lungs CTAB. Gastrointestinal: Soft and nontender. No distention.  Musculoskeletal: No lower extremity tenderness nor  edema. No gross deformities of extremities. Neurologic:  Normal speech and language. No gross focal neurologic deficits are appreciated.  Skin:  Skin is warm, dry and intact. No rash noted.  ____________________________________________   LABS (all labs ordered are listed, but only abnormal results are displayed)  Labs Reviewed  COMPREHENSIVE METABOLIC PANEL - Abnormal; Notable for the following components:      Result Value   Glucose, Bld  118 (*)    BUN 21 (*)    All other components within normal limits  D-DIMER, QUANTITATIVE (NOT AT [facility]) - Abnormal; Notable for the following components:   D-Dimer, Quant 2.17 (*)    All other components within normal limits  CBC WITH DIFFERENTIAL/PLATELET  TROPONIN I  BRAIN NATRIURETIC PEPTIDE  TROPONIN I   ____________________________________________  EKG   EKG Interpretation  Date/Time:  Friday March 08 2017 20:45:57 EST Ventricular Rate:  105 PR Interval:    QRS Duration: 102 QT Interval:  329 QTC Calculation: 435 R Axis:   39 Text Interpretation:  Sinus tachycardia Abnormal R-wave progression, early transition No STEMI.  Confirmed by Nanda Quinton [phone]) on 03/08/2017 8:54:02 PM Also confirmed by Nanda Quinton [phone]), editor Philomena Doheny [phone])  on 03/09/2017 9:00:07 AM       ____________________________________________  RADIOLOGY  Dg Chest 2 View  Result Date: 03/08/2017 CLINICAL DATA:  Chest pain and cough EXAM: CHEST  2 VIEW COMPARISON:  None. FINDINGS: There is slight bibasilar atelectasis. There is no edema or consolidation. The heart size and pulmonary vascularity are normal. No adenopathy. There is an apparent prominent osteophyte on the right in the midthoracic region. IMPRESSION: Mild bibasilar atelectasis.  No edema or consolidation. Electronically Signed   By: Lowella Grip III M.D.   On: 03/08/2017 21:17   Ct Angio Chest Pe W And/or Wo Contrast  Result Date: 03/08/2017 CLINICAL DATA:  Shortness of breath, chest heaviness, back pain and cough. Suspect pulmonary embolism. EXAM: CT ANGIOGRAPHY CHEST WITH CONTRAST TECHNIQUE: Multidetector CT imaging of the chest was performed using the standard protocol during bolus administration of intravenous contrast. Multiplanar CT image reconstructions and MIPs were obtained to evaluate the vascular anatomy. CONTRAST:  164mL ISOVUE-370 IOPAMIDOL (ISOVUE-370) INJECTION 76% COMPARISON:  Chest radiograph March 08, 2017  at 2105 hours FINDINGS: Mild motion degraded examination. CARDIOVASCULAR: Suboptimal contrast opacification of the pulmonary artery's (main pulmonary artery is 175 Hounsfield units, target is 250 Hounsfield units). Main pulmonary artery is not enlarged. No pulmonary arterial filling defects to the level of the segmental branches. Heart size is mildly enlarged. Mild coronary artery calcifications. No pericardial effusion. No pericardial effusion. Thoracic aorta is normal course and caliber, mild calcific atherosclerosis. MEDIASTINUM/NODES: No lymphadenopathy by CT size criteria. LUNGS/PLEURA: Low lung volumes. Tracheobronchial tree is patent, no pneumothorax. No pleural effusions, focal consolidations, pulmonary nodules or masses. Bibasilar atelectasis. UPPER ABDOMEN: Contracted gallbladder with punctate potential gallstone. No CT findings of acute cholecystitis. No acute process upper abdomen. MUSCULOSKELETAL: Nonacute. No advanced degenerative change of thoracic spine or acute osseous process. Review of the MIP images confirms the above findings. IMPRESSION: 1. No acute pulmonary embolism. 2. Mild cardiomegaly.  No acute pulmonary process. Aortic Atherosclerosis (ICD10-I70.0). Electronically Signed   By: Elon Alas M.D.   On: 03/08/2017 22:17    ____________________________________________   PROCEDURES  Procedure(s) performed:   Procedures  None ____________________________________________   INITIAL IMPRESSION / ASSESSMENT AND PLAN / ED COURSE  Pertinent labs & imaging results that were available during my care of the patient were  reviewed by me and considered in my medical decision making (see chart for details).  Patient presents to the emergency department for evaluation of chest discomfort with mild dyspnea.  Patient has history of high blood pressure and elevated BMI.  No other significant cardiac risk factors.  Lower extremity swelling seems chronic as does the pain in the  posterior left chest.  His chest discomfort is mainly with taking a deep breath he does experience some mild dyspnea.  No sharp, severe pain.  Given symptoms plan for ACS and PE evaluation.   CTA negative for PE. Plan for repeat troponin. Plan for PCP and Cardiology follow up.   12:00 AM  Repeat troponin negative. CTA negative for PE. Plan for PCP and Cardiology follow up.   At this time, I do not feel there is any life-threatening condition present. I have reviewed and discussed all results (EKG, imaging, lab, urine as appropriate), exam findings with patient. I have reviewed nursing notes and appropriate previous records.  I feel the patient is safe to be discharged home without further emergent workup. Discussed usual and customary return precautions. Patient and family (if present) verbalize understanding and are comfortable with this plan.  Patient will follow-up with their primary care provider. If they do not have a primary care provider, information for follow-up has been provided to them. All questions have been answered.  ____________________________________________  FINAL CLINICAL IMPRESSION(S) / ED DIAGNOSES  Final diagnoses:  Precordial chest pain  Other form of dyspnea     MEDICATIONS GIVEN DURING THIS VISIT:  Medications  iopamidol (ISOVUE-370) 76 % injection 100 mL (100 mLs Intravenous Contrast Given 03/08/17 2151)    Note:  This document was prepared using Dragon voice recognition software and may include unintentional dictation errors.  Nanda Quinton, MD Emergency Medicine    Shantana Christon, [facility], MD 03/09/17 [phone]

## 2017-03-08 NOTE — ED Notes (Signed)
Pt on monitor 

## 2017-03-08 NOTE — ED Notes (Signed)
Back from CT, no changes.  

## 2017-03-08 NOTE — ED Notes (Signed)
Pt taken straight to exam room for EKG. Arrives with spouse from [facility] by POV for sob and chest heaviness. EKG in hand from [facility] Denies CP at this time. Reports sob and chest heaviness x2d. Also L upper back pain. Reports increased cough over last 2 weeks. Subjectively felt hot earlier. Sx come and go. Worse in the am. Describes as increased gagging cough, and increased belching. (Denies: NV, fever, bleeding, dizziness, or visual changes). L tremor noted ("not new"). "Takes Lisinopril, aleve and MVI". No meds PTA. Pt of [place]. Has had flu vaccine this season.   Steady gait. Alert, NAD, calm, interactive, resps e/u, speaking in clear complete sentences, no dyspnea noted, skin W&D. Family at [facility].

## 2017-03-08 NOTE — ED Notes (Signed)
No changes, alert, NAD, calm, no dyspnea, to xray by stretcher.

## 2017-03-08 NOTE — ED Notes (Signed)
to CT via stretcher, no changes, alert, NAD, calm, interactive.

## 2017-03-08 NOTE — Discharge Instructions (Signed)

## 2017-03-10 ENCOUNTER — Ambulatory Visit: Payer: Self-pay | Admitting: Orthopedic Surgery

## 2017-03-14 DIAGNOSIS — I10 Essential (primary) hypertension: Secondary | ICD-10-CM | POA: Diagnosis not present

## 2017-03-14 DIAGNOSIS — D7589 Other specified diseases of blood and blood-forming organs: Secondary | ICD-10-CM | POA: Diagnosis not present

## 2017-03-14 DIAGNOSIS — R7309 Other abnormal glucose: Secondary | ICD-10-CM | POA: Diagnosis not present

## 2017-03-14 DIAGNOSIS — G629 Polyneuropathy, unspecified: Secondary | ICD-10-CM | POA: Diagnosis not present

## 2017-03-14 DIAGNOSIS — Z0000 Encounter for general adult medical examination without abnormal findings: Secondary | ICD-10-CM | POA: Diagnosis not present

## 2017-03-15 ENCOUNTER — Encounter: Payer: Self-pay | Admitting: *Deleted

## 2017-03-15 DIAGNOSIS — I739 Peripheral vascular disease, unspecified: Secondary | ICD-10-CM | POA: Insufficient documentation

## 2017-03-15 DIAGNOSIS — R309 Painful micturition, unspecified: Secondary | ICD-10-CM | POA: Insufficient documentation

## 2017-03-15 DIAGNOSIS — M1990 Unspecified osteoarthritis, unspecified site: Secondary | ICD-10-CM | POA: Insufficient documentation

## 2017-03-15 DIAGNOSIS — K219 Gastro-esophageal reflux disease without esophagitis: Secondary | ICD-10-CM | POA: Insufficient documentation

## 2017-03-15 DIAGNOSIS — M1710 Unilateral primary osteoarthritis, unspecified knee: Secondary | ICD-10-CM | POA: Insufficient documentation

## 2017-03-15 DIAGNOSIS — I10 Essential (primary) hypertension: Secondary | ICD-10-CM | POA: Insufficient documentation

## 2017-03-15 DIAGNOSIS — M797 Fibromyalgia: Secondary | ICD-10-CM | POA: Insufficient documentation

## 2017-03-15 DIAGNOSIS — E785 Hyperlipidemia, unspecified: Secondary | ICD-10-CM | POA: Insufficient documentation

## 2017-03-15 DIAGNOSIS — M179 Osteoarthritis of knee, unspecified: Secondary | ICD-10-CM | POA: Insufficient documentation

## 2017-03-18 DIAGNOSIS — R609 Edema, unspecified: Secondary | ICD-10-CM | POA: Insufficient documentation

## 2017-03-18 DIAGNOSIS — I2510 Atherosclerotic heart disease of native coronary artery without angina pectoris: Secondary | ICD-10-CM | POA: Insufficient documentation

## 2017-03-18 DIAGNOSIS — R079 Chest pain, unspecified: Secondary | ICD-10-CM | POA: Insufficient documentation

## 2017-03-18 NOTE — Progress Notes (Signed)
Cardiology Office Note:    Date:  03/19/2017   ID:  Todd Chan, DOB September 22, 1945, MRN [id number]  PCP:  Orpah Melter, MD  Cardiologist:  Shirlee More, MD   Referring MD: Orpah Melter, MD  ASSESSMENT:    1. Chest pain in adult   2. Coronary artery calcification seen on CT scan   3. Essential hypertension   4. Edema, unspecified type    PLAN:    In order of problems listed above:  1. Symptoms are atypical but he is at high risk of age hypertension vascular calcification and after discussion of modalities will undergo cardiac CTA.  I will see him back in my office afterwards. 2. Place an increased cardiovascular risk CTA cardiac ordered 3. Stable blood pressure target continue current treatment ACE inhibitor 4. He has typical chronic venous insufficiency.  He is at risk for heart failure with age and hypertension BNP level will be checked as a screen of abnormal cardiac echo.  Next appointment 3 weeks   Medication Adjustments/Labs and Tests Ordered: Current medicines are reviewed at length with the patient today.  Concerns regarding medicines are outlined above.  Orders Placed This Encounter  Procedures  . CT CORONARY MORPH W/CTA COR W/SCORE W/CA W/CM &/OR WO/CM  . CT CORONARY FRACTIONAL FLOW RESERVE DATA PREP  . CT CORONARY FRACTIONAL FLOW RESERVE FLUID ANALYSIS  . Basic metabolic panel  . Pro b natriuretic peptide (BNP)   Meds ordered this encounter  Medications  . metoprolol tartrate (LOPRESSOR) 50 MG tablet    Sig: Take 1 tablet (50 mg total) by mouth once for 1 dose. Take 1 hour before cardiac CTA.    Dispense:  1 tablet    Refill:  0     Chief Complaint  Patient presents with  . Chest Pain    Tightness rather than pain  . Shortness of Breath    History of Present Illness:    Todd Chan is a 71 y.o. male with hypertension and hyperlipidemia who is being seen today for the evaluation of chest pain after ED visit at the request of Orpah Melter, MD.  [place] [facility] [place] / ED COURSE  Pertinent labs & imaging results that were available during my care of the patient were reviewed by me and considered in my medical decision making (see chart for details). Patient presents to the emergency department for evaluation of chest discomfort with mild dyspnea.  Patient has history of high blood pressure and elevated BMI.  No other significant cardiac risk factors.  Lower extremity swelling seems chronic as does the pain in the posterior left chest.  His chest discomfort is mainly with taking a deep breath he does experience some mild dyspnea.  No sharp, severe pain.  Given symptoms plan for ACS and PE evaluation.  CTA negative for PE. Plan for repeat troponin. Plan for PCP and Cardiology follow up.   12:00 AM  Repeat troponin negative. CTA negative for PE. Plan for PCP and Cardiology follow up.  At this time, I do not feel there is any life-threatening condition present. I have reviewed and discussed all results (EKG, imaging, lab, urine as appropriate), exam findings with patient. I have reviewed nursing notes and appropriate previous records.  I feel the patient is safe to be discharged home without further emergent workup. Discussed usual and customary return precautions. Patient and family (if present) verbalize understanding and are comfortable with this plan.  Patient will follow-up with their  primary care provider. If they do not have a primary care provider, information for follow-up has been provided to them. All questions have been answered.  He describes a long history for decades of intermittent episodes of shortness of breath pressure in the chest radiating to his back nonexertional in nature.  Tends to resolve spontaneously and with massage in his back.  Recently was seen in the emergency room for much more severe prolonged episode that does not improve.  With his lower extremity edema recent total knee  replacement elevated d-dimer level CT of the chest was performed he has vascular calcification but no evidence of pulmonary embolism.  He relates he has had stress test performed more than 1-2 decades ago.  Between episodes he has no exertional shortness of breath chest pain palpitation or syncope.  He does have chronic lower extremity edema stasis changes and varicosities and previous venous duplex showed no evidence of deep vein thrombosis.  He anticipates upcoming total knee replacement in mid January.  Past Medical History:  Diagnosis Date  . Arthritis   . Fibromyalgia   . GERD (gastroesophageal reflux disease)   . Hyperlipidemia   . Hypertension   . Osteoarthritis of knee   . Painful urination   . Peripheral vascular disease ([place])    bilateral edema     Past Surgical History:  Procedure Laterality Date  . KNEE ARTHROSCOPY Left 6-7 years ago  . TONSILLECTOMY    . 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 aspirin EC 81 MG tablet Take 81 mg by mouth daily.  . cetirizine (ZYRTEC) 10 MG tablet Take 10 mg by mouth daily.  Marland Kitchen lisinopril (PRINIVIL,ZESTRIL) 10 MG tablet Take 10 mg by mouth daily.  . naproxen sodium (ALEVE) 220 MG tablet Take 220 mg by mouth daily as needed (Back pain).     Allergies:   Patient has no known allergies.   Social History   Socioeconomic History  . Marital status: Married    Spouse name: None  . Number of children: None  . Years of education: None  . Highest education level: None  Social Needs  . Financial resource strain: None  . Food insecurity - worry: None  . Food insecurity - inability: None  . Transportation needs - medical: None  . Transportation needs - non-medical: None  Occupational History  . None  Tobacco Use  . Smoking status: Former Smoker     Last attempt to quit: 03/16/1987    Years since quitting: 30.0  . Smokeless tobacco: Never Used  . Tobacco comment: quit 30 yeara ago   Substance and Sexual Activity  . Alcohol use: Yes    Alcohol/week: 5.4 oz    Types: 7 Glasses of wine, 2 Shots of liquor per week  . Drug use: No  . Sexual activity: None  Other Topics Concern  . None  Social History Narrative  . None     Family History: The patient's family history is not on file.  ROS:   Review of Systems  Constitution: Negative.  HENT: Negative.   Eyes: Negative.   Cardiovascular: Positive for chest pain and dyspnea on exertion.  Respiratory: Positive for cough and shortness of breath.   Endocrine: Negative.   Hematologic/Lymphatic: Negative.   Skin: Negative.   Musculoskeletal: Positive for back pain  and joint pain.  Gastrointestinal: Positive for heartburn.  Genitourinary: Negative.   Neurological: Negative.   Psychiatric/Behavioral: Negative.   Allergic/Immunologic: Negative.    Please see the history of present illness.     All other systems reviewed and are negative.  EKGs/Labs/Other Studies Reviewed:    The following studies were reviewed today: Prior to the visit I reviewed his primary care emergency room records EKG labs and CTA.  He also has had a previous lower extremity venous duplex without evidence of DVT  EKG:  EKG in ED [facility] normal EKG CTA chest: no PE noted, mild CAC seen Recent Labs: troponin, 2 were undetectable 03/08/2017: ALT 25; B Natriuretic Peptide 18.0; BUN 21; Creatinine, Ser 0.84; Hemoglobin 14.2; Platelets 202; Potassium 4.0; Sodium 139  Recent Lipid Panel No results found for: CHOL, TRIG, HDL, CHOLHDL, VLDL, LDLCALC, LDLDIRECT  Physical Exam:    VS:  BP (!) 160/88 (BP Location: Left Arm, Patient Position: Sitting, Cuff Size: Large)   Pulse (!) 106   Ht 6' (1.829 m)   Wt 288 lb (130.6 kg)   SpO2 97%   BMI 39.06 kg/m     Wt Readings from Last 3 Encounters:  03/19/17 288 lb  (130.6 kg)  03/08/17 275 lb (124.7 kg)  10/08/16 278 lb (126.1 kg)     GEN:  Well nourished, well developed in no acute distress mild tremor HEENT: Normal NECK: No JVD; No carotid bruits LYMPHATICS: No lymphadenopathy CARDIAC: RRR, no murmurs, rubs, gallops RESPIRATORY:  Clear to auscultation without rales, wheezing or rhonchi  ABDOMEN: Soft, non-tender, non-distended MUSCULOSKELETAL:  2+ L > R to the knee edema with stasis changes and varicoities No deformity  SKIN: Warm and dry NEUROLOGIC:  Alert and oriented x 3 PSYCHIATRIC:  Normal affect     Signed, Shirlee More, MD  03/19/2017 2:44 PM    [place]

## 2017-03-19 ENCOUNTER — Encounter: Payer: Self-pay | Admitting: Cardiology

## 2017-03-19 ENCOUNTER — Ambulatory Visit (INDEPENDENT_AMBULATORY_CARE_PROVIDER_SITE_OTHER): Payer: Medicare Other | Admitting: Cardiology

## 2017-03-19 VITALS — BP 160/88 | HR 106 | Ht 72.0 in | Wt 288.0 lb

## 2017-03-19 DIAGNOSIS — I2510 Atherosclerotic heart disease of native coronary artery without angina pectoris: Secondary | ICD-10-CM | POA: Diagnosis not present

## 2017-03-19 DIAGNOSIS — R609 Edema, unspecified: Secondary | ICD-10-CM | POA: Diagnosis not present

## 2017-03-19 DIAGNOSIS — R079 Chest pain, unspecified: Secondary | ICD-10-CM | POA: Diagnosis not present

## 2017-03-19 DIAGNOSIS — I10 Essential (primary) hypertension: Secondary | ICD-10-CM | POA: Diagnosis not present

## 2017-03-19 DIAGNOSIS — R0609 Other forms of dyspnea: Secondary | ICD-10-CM | POA: Diagnosis not present

## 2017-03-19 MED ORDER — METOPROLOL TARTRATE 50 MG PO TABS
50.0000 mg | ORAL_TABLET | Freq: Once | ORAL | 0 refills | Status: DC
Start: 2017-03-19 — End: 2017-03-21

## 2017-03-19 NOTE — Patient Instructions (Addendum)
Medication Instructions:  Your physician recommends that you continue on your current medications as directed. Please refer to the Current Medication list given to you today.  Labwork: Your physician recommends that you return for lab work in: today. BMP, BNP  Testing/Procedures: Your physician has requested that you have cardiac CT. Cardiac computed tomography (CT) is a painless test that uses an x-ray machine to take clear, detailed pictures of your heart. For further information please visit [email or web address]. Please follow instruction sheet as given.  Please arrive at the [facility] main entrance of [facility] at xx:xx AM (30-45 minutes prior to test start time)  [facility] [address] [place], [place] [address] [phone]  Proceed to the [facility] Department (First Floor).  Please follow these instructions carefully (unless otherwise directed):  Hold all erectile dysfunction medications at least 48 hours prior to test.  On the Night Before the Test: . Drink plenty of water. . Do not consume any caffeinated/decaffeinated beverages or chocolate 12 hours prior to your test. . Do not take any antihistamines 12 hours prior to your test.  On the Day of the Test: . Drink plenty of water. Do not drink any water within one hour of the test. . Do not eat any food 4 hours prior to the test. . You may take your regular medications prior to the test. . IF NOT ON A BETA BLOCKER - Take 50 mg of lopressor (metoprolol) one hour before the test.  After the Test: . Drink plenty of water. . After receiving IV contrast, you may experience a mild flushed feeling. This is normal. . On occasion, you may experience a mild rash up to 24 hours after the test. This is not dangerous. If this occurs, you can take Benadryl 25 mg and increase your fluid intake. . If you experience trouble breathing, this can be serious. If it is severe call 911 IMMEDIATELY. If it  is mild, please call our office.   Follow-Up: Your physician recommends that you schedule a follow-up appointment in: 4 weeks.  Any Other Special Instructions Will Be Listed Below (If Applicable).     If you need a refill on your cardiac medications before your next appointment, please call your pharmacy.

## 2017-03-20 LAB — BASIC METABOLIC PANEL
BUN/Creatinine Ratio: 20 (ref 10–24)
BUN: 18 mg/dL (ref 8–27)
CALCIUM: 9.7 mg/dL (ref 8.6–10.2)
CHLORIDE: 102 mmol/L (ref 96–106)
CO2: 27 mmol/L (ref 20–29)
Creatinine, Ser: 0.9 mg/dL (ref 0.76–1.27)
GFR calc Af Amer: 100 mL/min/{1.73_m2} (ref >59)
GFR calc non Af Amer: 87 mL/min/{1.73_m2} (ref >59)
Glucose: 104 mg/dL — ABNORMAL HIGH (ref 65–99)
POTASSIUM: 4.7 mmol/L (ref 3.5–5.2)
Sodium: 143 mmol/L (ref 134–144)

## 2017-03-20 LAB — PRO B NATRIURETIC PEPTIDE: NT-Pro BNP: 43 pg/mL (ref 0–376)

## 2017-03-21 ENCOUNTER — Encounter (INDEPENDENT_AMBULATORY_CARE_PROVIDER_SITE_OTHER): Payer: Self-pay

## 2017-03-21 ENCOUNTER — Other Ambulatory Visit: Payer: Self-pay

## 2017-03-21 MED ORDER — METOPROLOL TARTRATE 25 MG PO TABS: 25.0000 mg | ORAL_TABLET | Freq: Two times a day (BID) | ORAL | 3 refills | Status: DC

## 2017-03-27 ENCOUNTER — Ambulatory Visit: Payer: Self-pay | Admitting: Orthopedic Surgery

## 2017-03-27 NOTE — H&P (Signed)
Todd Chan DOB: Aug 25, 1945 Married / Language: English / Race: White Male Date of Admission:  04/15/2017 CC:  Right knee pain History of Present Illness  The patient is a 71 year old male who comes in  for a preoperative History and Physical. The patient is scheduled for a right total knee arthroplasty to be performed by Dr. [name]. [name], MD at [facility] on 04-15-2017. The patient is a 71 year old male presenting several months out from his left total knee arthroplasty. The patient states that he is doing very well at this time. The pain is under excellent control at this time and describe their pain as 0 / 10 on a 10 point pain scale and some crunching with ROM. Overall he said that the LEFT knee is doing extremely well at this time. The RIGHT knee is becoming increasingly problematic and he wants to go ahead and get that replaced at this time. The RIGHT knee is bone-on-bone and he has failed injections in the past. He still exercises the LEFT knee. The swelling is much better at this time. He is very pleased with his results on the LEFT. He is now ready to proceed with the right side at this time. They have been treated conservatively in the past for the above stated problem and despite conservative measures, they continue to have progressive pain and severe functional limitations and dysfunction. They have failed non-operative management including home exercise, medications, and injections. It is felt that they would benefit from undergoing total joint replacement. Risks and benefits of the procedure have been discussed with the patient and they elect to proceed with surgery. There are no active contraindications to surgery such as ongoing infection or rapidly progressive neurological disease.    Problem List/Past Medical  Acute right-sided low back pain without sciatica (M54.5)  Spinal stenosis, lumbar (M48.061)  Primary osteoarthritis of both knees (M17.0)  Swelling of  joint of pelvic region or thigh, left (M25.452)  High blood pressure  Measles  Mumps  Varicose veins    Allergies  No Known Drug Allergies   Family History Cancer  Father, Mother, Sister. Chronic Obstructive Lung Disease  Brother. Hypertension  Sister. Severe allergy  Mother. Father  Deceased. age 38 Mother  Deceased. age 51  Social History Tobacco use  Former smoker. 03/14/2015: smoke(d) 2 pack(s) per day, smoked for 20 years Tobacco / smoke exposure  03/14/2015: no Children  2 Current drinker  03/14/2015: Currently drinks beer, wine and hard liquor 8-14 times per week Current work status  retired Exercise  Light, weekly, 20 - 30 minutes, cycling. Exercises weekly; does other Living situation  live with spouse Marital status  married Most recent primary occupation  Retired No history of drug/alcohol rehab  Not under pain contract  Number of flights of stairs before winded  2-3 Advance Directives  Living Will, [place] With Family, Home, Straight to Outpatient Therapy.  Medication History Multi Vitamin Mens (Oral) Active. (qd) Fish Oil (1200MG  Capsule DR, Oral) Active. (qd) Vitamin D (1000UNIT Tablet, Oral) Active. (qd) Calcium 600 (1500 (600 Ca)MG Tablet, Oral) Active. Aleve (220MG  Tablet, Oral) Active. Lisinopril (10MG  Tablet, Oral) Active. (qd) Aspirin (81MG  Tablet Chewable, Oral) Active.  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  Review of Systems General Not Present- Chills, Fatigue, Fever, Memory Loss, Night Sweats, Weight Gain and Weight Loss. Skin Not Present- Eczema, Hives, Itching, Lesions and  Rash. HEENT Not Present- Dentures, Double Vision, Headache, Hearing Loss, Tinnitus and Visual Loss. Respiratory Present- Cough and Shortness of breath. Not Present- Allergies, Chronic Cough, Coughing up blood, Shortness of breath at rest and  Shortness of breath with exertion. Cardiovascular Not Present- Chest Pain, Difficulty Breathing Lying Down, Murmur, Palpitations, Racing/skipping heartbeats and Swelling. Gastrointestinal Not Present- Abdominal Pain, Bloody Stool, Constipation, Diarrhea, Difficulty Swallowing, Heartburn, Jaundice, Loss of appetitie, Nausea and Vomiting. Male Genitourinary Not Present- Blood in Urine, Discharge, Flank Pain, Incontinence, Painful Urination, Urgency, Urinary frequency, Urinary Retention, Urinating at Night and Weak urinary stream. Musculoskeletal Present- Back Pain and Morning Stiffness. Not Present- Joint Pain, Joint Swelling, Muscle Pain, Muscle Weakness and Spasms. Neurological Not Present- Blackout spells, Difficulty with balance, Dizziness, Paralysis, Tremor and Weakness. Psychiatric Not Present- Insomnia.  Vitals  Weight: 275 lb Height: 73in Weight was reported by patient. Height was reported by patient. Body Surface Area: 2.46 m Body Mass Index: 36.28 kg/m  Pulse: 88 (Regular)  BP: 148/82 (Sitting, Right Arm, Standard)   Physical Exam  General Mental Status -Alert, cooperative and good historian. General Appearance-pleasant, Not in acute distress. Orientation-Oriented X3. Build & Nutrition-Well nourished and Well developed.  Head and Neck Head-normocephalic, atraumatic . Neck Global Assessment - supple, no bruit auscultated on the right, no bruit auscultated on the left.  Eye Pupil - Bilateral-Regular and Round. Motion - Bilateral-EOMI.  Chest and Lung Exam Auscultation Breath sounds - clear at anterior chest wall and clear at posterior chest wall. Adventitious sounds - No Adventitious sounds.  Cardiovascular Auscultation Rhythm - Regular rate and rhythm. Heart Sounds - S1 WNL and S2 WNL. Murmurs & Other Heart Sounds: Murmur 1 - Location - Aortic Area. Timing - Early systolic. Grade - II/VI. Character - Low  pitched.  Abdomen Palpation/Percussion Tenderness - Abdomen is non-tender to palpation. Rigidity (guarding) - Abdomen is soft. Auscultation Auscultation of the abdomen reveals - Bowel sounds normal.  Male Genitourinary Note: Not done, not pertinent to present illness   Musculoskeletal Note: His LEFT knee shows minimal swelling. The swelling in his lower leg is also greatly improved. His range of motion is 0-124. There is no instability. His RIGHT knee shows no effusion. He has got a significant varus deformity on the RIGHT. Range of motion 5-120 with markedly crepitus on range of motion. He is tender medial greater than lateral with no instability.     Assessment & Plan Primary osteoarthritis of right knee (M17.11) Aftercare following left knee joint replacement surgery (Z47.1, [id number])  Note:Surgical Plans: Right Total Knee Replacement  Disposition: Home and then to Outpatient therapy at [facility] on Jan 18th  PCP: Dr. Olen Pel Cards: Dr. Bettina Gavia - Please note that at the time of this H&P, the patient was waiting on final diagnostic study as per cardiology service.   IV TXA  Anesthesia Issues: None  Patient was instructed on what medications to stop prior to surgery.  Signed electronically by Joelene Millin, III PA-C

## 2017-04-02 HISTORY — PX: CARDIAC CATHETERIZATION: SHX172

## 2017-04-03 ENCOUNTER — Encounter (INDEPENDENT_AMBULATORY_CARE_PROVIDER_SITE_OTHER): Payer: Self-pay

## 2017-04-03 DIAGNOSIS — I2510 Atherosclerotic heart disease of native coronary artery without angina pectoris: Secondary | ICD-10-CM

## 2017-04-03 DIAGNOSIS — Z01810 Encounter for preprocedural cardiovascular examination: Secondary | ICD-10-CM

## 2017-04-05 ENCOUNTER — Encounter (INDEPENDENT_AMBULATORY_CARE_PROVIDER_SITE_OTHER): Payer: Self-pay

## 2017-04-08 ENCOUNTER — Telehealth (HOSPITAL_COMMUNITY): Payer: Self-pay | Admitting: *Deleted

## 2017-04-08 ENCOUNTER — Ambulatory Visit (HOSPITAL_COMMUNITY): Payer: Medicare Other | Attending: Cardiology

## 2017-04-08 DIAGNOSIS — I2510 Atherosclerotic heart disease of native coronary artery without angina pectoris: Secondary | ICD-10-CM | POA: Diagnosis not present

## 2017-04-08 DIAGNOSIS — Z01810 Encounter for preprocedural cardiovascular examination: Secondary | ICD-10-CM | POA: Diagnosis not present

## 2017-04-08 MED ORDER — REGADENOSON 0.4 MG/5ML IV SOLN
0.4000 mg | Freq: Once | INTRAVENOUS | Status: AC
  Administered 2017-04-08: 0.4 mg via INTRAVENOUS

## 2017-04-08 MED ORDER — TECHNETIUM TC 99M TETROFOSMIN IV KIT
33.0000 | PACK | Freq: Once | INTRAVENOUS | Status: AC | PRN
  Administered 2017-04-08: 33 via INTRAVENOUS
  Filled 2017-04-08: qty 33

## 2017-04-08 NOTE — Telephone Encounter (Signed)
Patient given detailed instructions per Myocardial Perfusion Study Information Sheet for the test on 04/08/17 at 1245. Patient notified to arrive 15 minutes early and that it is imperative to arrive on time for appointment to keep from having the test rescheduled.  If you need to cancel or reschedule your appointment, please call the office within 24 hours of your appointment. . Patient verbalized understanding.Kaisyn Reinhold, Ranae Palms

## 2017-04-08 NOTE — Patient Instructions (Addendum)
Todd Chan  04/08/2017   Your procedure is scheduled on: 04-15-17   Report to [facility] Main  Entrance Report to Admitting at 11:15 AM   Call this number if you have problems the morning of surgery [phone]     Remember: Do not eat food or drink liquids :After Midnight. You may have a Clear Liquid Diet from Midnight until 7:45 AM. After 7:45 AM, nothing until after surgery.    CLEAR LIQUID DIET   Foods Allowed                                                                     Foods Excluded  Coffee and tea, regular and decaf                             liquids that you cannot  Plain Jell-O in any flavor                                             see through such as: Fruit ices (not with fruit pulp)                                     milk, soups, orange juice  Iced Popsicles                                    All solid food Carbonated beverages, regular and diet                                    Cranberry, grape and apple juices Sports drinks like Gatorade Lightly seasoned clear broth or consume(fat free) Sugar, honey syrup  Sample Menu Breakfast                                Lunch                                     Supper Cranberry juice                    Beef broth                            Chicken broth Jell-O                                     Grape juice                           Apple  juice Coffee or tea                        Jell-O                                      Popsicle                                                Coffee or tea                        Coffee or tea  _____________________________________________________________________     Take these medicines the morning of surgery with A SIP OF WATER: Cetirizine (Zyrtec)                                 You may not have any metal on your body including hair pins and              piercings  Do not wear jewelry, lotions, powders or deodorant             Men may shave  face and neck.   Do not bring valuables to the hospital. Todd Chan.  Contacts, dentures or bridgework may not be worn into surgery.  Leave suitcase in the car. After surgery it may be brought to your room.                 Please read over the following fact sheets you were given: _____________________________________________________________________           [facility] - Preparing for Surgery Before surgery, you can play an important role.  Because skin is not sterile, your skin needs to be as free of germs as possible.  You can reduce the number of germs on your skin by washing with CHG (chlorahexidine gluconate) soap before surgery.  CHG is an antiseptic cleaner which kills germs and bonds with the skin to continue killing germs even after washing. Please DO NOT use if you have an allergy to CHG or antibacterial soaps.  If your skin becomes reddened/irritated stop using the CHG and inform your nurse when you arrive at Short Stay. Do not shave (including legs and underarms) for at least 48 hours prior to the first CHG shower.  You may shave your face/neck. Please follow these instructions carefully:  1.  Shower with CHG Soap the night before surgery and the  morning of Surgery.  2.  If you choose to wash your hair, wash your hair first as usual with your  normal  shampoo.  3.  After you shampoo, rinse your hair and body thoroughly to remove the  shampoo.                           4.  Use CHG as you would any other liquid soap.  You can apply chg directly  to the skin and wash  Gently with a scrungie or clean washcloth.  5.  Apply the CHG Soap to your body ONLY FROM THE NECK DOWN.   Do not use on face/ open                           Wound or open sores. Avoid contact with eyes, ears mouth and genitals (private parts).                       Wash face,  Genitals (private parts) with your normal soap.             6.  Wash  thoroughly, paying special attention to the area where your surgery  will be performed.  7.  Thoroughly rinse your body with warm water from the neck down.  8.  DO NOT shower/wash with your normal soap after using and rinsing off  the CHG Soap.                9.  Pat yourself dry with a clean towel.            10.  Wear clean pajamas.            11.  Place clean sheets on your bed the night of your first shower and do not  sleep with pets. Day of Surgery : Do not apply any lotions/deodorants the morning of surgery.  Please wear clean clothes to the hospital/surgery center.  FAILURE TO FOLLOW THESE INSTRUCTIONS MAY RESULT IN THE CANCELLATION OF YOUR SURGERY PATIENT SIGNATURE_________________________________  NURSE SIGNATURE__________________________________  ________________________________________________________________________   Todd Chan  An incentive spirometer is a tool that can help keep your lungs clear and active. This tool measures how well you are filling your lungs with each breath. Taking long deep breaths may help reverse or decrease the chance of developing breathing (pulmonary) problems (especially infection) following:  A long period of time when you are unable to move or be active. BEFORE THE PROCEDURE   If the spirometer includes an indicator to show your best effort, your nurse or respiratory therapist will set it to a desired goal.  If possible, sit up straight or lean slightly forward. Try not to slouch.  Hold the incentive spirometer in an upright position. INSTRUCTIONS FOR USE  1. Sit on the edge of your bed if possible, or sit up as far as you can in bed or on a chair. 2. Hold the incentive spirometer in an upright position. 3. Breathe out normally. 4. Place the mouthpiece in your mouth and seal your lips tightly around it. 5. Breathe in slowly and as deeply as possible, raising the piston or the ball toward the top of the column. 6. Hold your  breath for 3-5 seconds or for as long as possible. Allow the piston or ball to fall to the bottom of the column. 7. Remove the mouthpiece from your mouth and breathe out normally. 8. Rest for a few seconds and repeat Steps 1 through 7 at least 10 times every 1-2 hours when you are awake. Take your time and take a few normal breaths between deep breaths. 9. The spirometer may include an indicator to show your best effort. Use the indicator as a goal to work toward during each repetition. 10. After each set of 10 deep breaths, practice coughing to be sure your lungs are clear. If you have an incision (the cut made at the time of  surgery), support your incision when coughing by placing a pillow or rolled up towels firmly against it. Once you are able to get out of bed, walk around indoors and cough well. You may stop using the incentive spirometer when instructed by your caregiver.  RISKS AND COMPLICATIONS  Take your time so you do not get dizzy or light-headed.  If you are in pain, you may need to take or ask for pain medication before doing incentive spirometry. It is harder to take a deep breath if you are having pain. AFTER USE  Rest and breathe slowly and easily.  It can be helpful to keep track of a log of your progress. Your caregiver can provide you with a simple table to help with this. If you are using the spirometer at home, follow these instructions: [place] IF:   You are having difficultly using the spirometer.  You have trouble using the spirometer as often as instructed.  Your pain medication is not giving enough relief while using the spirometer.  You develop fever of 100.5 F (38.1 C) or higher. SEEK IMMEDIATE MEDICAL CARE IF:   You cough up bloody sputum that had not been present before.  You develop fever of 102 F (38.9 C) or greater.  You develop worsening pain at or near the incision site. MAKE SURE YOU:   Understand these instructions.  Will watch  your condition.  Will get help right away if you are not doing well or get worse. Document Released: 07/30/2006 Document Revised: 06/11/2011 Document Reviewed: 09/30/2006 ExitCare Patient Information 2014 ExitCare, [place].   ________________________________________________________________________  WHAT IS A BLOOD TRANSFUSION? Blood Transfusion Information  A transfusion is the replacement of blood or some of its parts. Blood is made up of multiple cells which provide different functions.  Red blood cells carry oxygen and are used for blood loss replacement.  White blood cells fight against infection.  Platelets control bleeding.  Plasma helps clot blood.  Other blood products are available for specialized needs, such as hemophilia or other clotting disorders. BEFORE THE TRANSFUSION  Who gives blood for transfusions?   Healthy volunteers who are fully evaluated to make sure their blood is safe. This is blood bank blood. Transfusion therapy is the safest it has ever been in the practice of medicine. Before blood is taken from a donor, a complete history is taken to make sure that person has no history of diseases nor engages in risky social behavior (examples are intravenous drug use or sexual activity with multiple partners). The donor's travel history is screened to minimize risk of transmitting infections, such as malaria. The donated blood is tested for signs of infectious diseases, such as HIV and hepatitis. The blood is then tested to be sure it is compatible with you in order to minimize the chance of a transfusion reaction. If you or a relative donates blood, this is often done in anticipation of surgery and is not appropriate for emergency situations. It takes many days to process the donated blood. RISKS AND COMPLICATIONS Although transfusion therapy is very safe and saves many lives, the main dangers of transfusion include:   Getting an infectious disease.  Developing a  transfusion reaction. This is an allergic reaction to something in the blood you were given. Every precaution is taken to prevent this. The decision to have a blood transfusion has been considered carefully by your caregiver before blood is given. Blood is not given unless the benefits outweigh the risks. AFTER THE  TRANSFUSION  Right after receiving a blood transfusion, you will usually feel much better and more energetic. This is especially true if your red blood cells have gotten low (anemic). The transfusion raises the level of the red blood cells which carry oxygen, and this usually causes an energy increase.  The nurse administering the transfusion will monitor you carefully for complications. HOME CARE INSTRUCTIONS  No special instructions are needed after a transfusion. You may find your energy is better. Speak with your caregiver about any limitations on activity for underlying diseases you may have. SEEK MEDICAL CARE IF:   Your condition is not improving after your transfusion.  You develop redness or irritation at the intravenous (IV) site. SEEK IMMEDIATE MEDICAL CARE IF:  Any of the following symptoms occur over the next 12 hours:  Shaking chills.  You have a temperature by mouth above 102 F (38.9 C), not controlled by medicine.  Chest, back, or muscle pain.  People around you feel you are not acting correctly or are confused.  Shortness of breath or difficulty breathing.  Dizziness and fainting.  You get a rash or develop hives.  You have a decrease in urine output.  Your urine turns a dark color or changes to pink, red, or brown. Any of the following symptoms occur over the next 10 days:  You have a temperature by mouth above 102 F (38.9 C), not controlled by medicine.  Shortness of breath.  Weakness after normal activity.  The white part of the eye turns yellow (jaundice).  You have a decrease in the amount of urine or are urinating less often.  Your  urine turns a dark color or changes to pink, red, or brown. Document Released: 03/16/2000 Document Revised: 06/11/2011 Document Reviewed: 11/03/2007 [facility] Patient Information 2014 [place], [place].  _______________________________________________________________________

## 2017-04-09 ENCOUNTER — Ambulatory Visit (HOSPITAL_COMMUNITY): Payer: Medicare Other | Attending: Cardiology

## 2017-04-09 LAB — MYOCARDIAL PERFUSION IMAGING
CHL CUP NUCLEAR SDS: 1
CHL CUP NUCLEAR SRS: 0
CHL CUP RESTING HR STRESS: 88 {beats}/min
LV dias vol: 151 mL (ref 62–150)
LV sys vol: 57 mL
NUC STRESS TID: 0.92
Peak HR: 112 {beats}/min
RATE: 0.35
SSS: 1

## 2017-04-09 MED ORDER — TECHNETIUM TC 99M TETROFOSMIN IV KIT
32.0000 | PACK | Freq: Once | INTRAVENOUS | Status: AC | PRN
  Administered 2017-04-09: 32 via INTRAVENOUS
  Filled 2017-04-09: qty 32

## 2017-04-10 ENCOUNTER — Encounter (HOSPITAL_COMMUNITY)
Admission: RE | Admit: 2017-04-10 | Discharge: 2017-04-10 | Disposition: A | Discharge status: Home / Self Care | Payer: Medicare Other | Source: Ambulatory Visit | Attending: Orthopedic Surgery | Admitting: Orthopedic Surgery

## 2017-04-11 ENCOUNTER — Encounter: Payer: Self-pay | Admitting: Cardiology

## 2017-04-11 ENCOUNTER — Ambulatory Visit (INDEPENDENT_AMBULATORY_CARE_PROVIDER_SITE_OTHER): Payer: Medicare Other | Admitting: Cardiology

## 2017-04-11 VITALS — BP 172/96 | HR 97 | Ht 72.0 in | Wt 287.0 lb

## 2017-04-11 DIAGNOSIS — R079 Chest pain, unspecified: Secondary | ICD-10-CM | POA: Diagnosis not present

## 2017-04-11 DIAGNOSIS — R9439 Abnormal result of other cardiovascular function study: Secondary | ICD-10-CM | POA: Diagnosis not present

## 2017-04-11 NOTE — Patient Instructions (Addendum)
Medication Instructions:  Your physician has recommended you make the following change in your medication:  START metoprolol 25 mg twice daily  Labwork: Your physician recommends that you return for lab work in: today. BMP, CBC, pt/inr  Testing/Procedures: You had an EKG today.  Your physician has requested that you have a cardiac catheterization. Cardiac catheterization is used to diagnose and/or treat various heart conditions. Doctors may recommend this procedure for a number of different reasons. The most common reason is to evaluate chest pain. Chest pain can be a symptom of coronary artery disease (CAD), and cardiac catheterization can show whether plaque is narrowing or blocking your heart's arteries. This procedure is also used to evaluate the valves, as well as measure the blood flow and oxygen levels in different parts of your heart. For further information please visit [email or web address]. Please follow instruction sheet, as given.    [facility] [address], [place] [address] Dept: [phone] Loc: [phone]  Todd Chan  04/11/2017  You are scheduled for a Cardiac Catheterization on Wednesday, January 16 with Dr. Shelva Majestic.  1. Please arrive at the [facility] (Main Entrance A) at [facility]: [address] [place], [place] [address] at 6:30 AM (two hours before your procedure to ensure your preparation). Free valet parking service is available.   Special note: Every effort is made to have your procedure done on time. Please understand that emergencies sometimes delay scheduled procedures.  2. Diet: Do not eat or drink anything after midnight prior to your procedure except sips of water to take medications.  3. Labs: We are checking today.  4. Medication instructions in preparation for your procedure:  On the morning of your procedure, take your Aspirin  and any morning medicines NOT listed above.  You may use sips of water.  5. Plan for one night stay--bring personal belongings. 6. Bring a current list of your medications and current insurance cards. 7. You MUST have a responsible person to drive you home. 8. Someone MUST be with you the first 24 hours after you arrive home or your discharge will be delayed. 9. Please wear clothes that are easy to get on and off and wear slip-on shoes.  Thank you for allowing [place] to care for you!   -- [facility]   Follow-Up: Your physician recommends that you schedule a follow-up appointment in: 2 weeks.  Any Other Special Instructions Will Be Listed Below (If Applicable).     If you need a refill on your cardiac medications before your next appointment, please call your pharmacy.

## 2017-04-11 NOTE — Progress Notes (Signed)
Cardiology Office Note:    Date:  04/11/2017   ID:  [name], DOB Jan 28, 1946, MRN [id number]  PCP:  Orpah Melter, MD  Cardiologist:  Shirlee More, MD    Referring MD: Orpah Melter, MD    ASSESSMENT:    1. Chest pain in adult   2. Abnormal myocardial perfusion study    PLAN:    In order of problems listed above:  1. With pending joint replacement surgery symptoms abnormal myocardial perfusion study is referred for left heart catheterization and possible PCI.  Options benefits and risks detailed with the patient and his spouse he has no dye allergy renal insufficiency or contraindication to dual antiplatelet therapy his hypertension is poorly controlled he will start the beta-blocker if prescribed. 2. Referral to left heart catheterization   Next appointment: 2 weeks   Medication Adjustments/Labs and Tests Ordered: Current medicines are reviewed at length with the patient today.  Concerns regarding medicines are outlined above.  Orders Placed This Encounter  Procedures  . DG Chest 2 View  . CBC with Differential/Platelet  . Basic metabolic panel  . Protime-INR  . EKG 12-Lead   No orders of the defined types were placed in this encounter.   Chief Complaint  Patient presents with  . Follow-up    History of Present Illness:    Todd Chan is a 72 y.o. male with a hx of chest pain and a recent abnormal MPI last seen 03/19/17.. Compliance with diet, lifestyle and medications: Yes He has pending orthopedic surgery has been canceled. He is quite apprehensive and has a vague awareness of his chest at times momentary not severe but remains limited in activities with exertional shortness of breath.  No palpitations syncope or TIA   Study Highlights 04/09/17   Nuclear stress EF: 62%.  There was no ST segment deviation noted during stress.  Defect 1: There is a medium defect of moderate severity present in the basal inferior, mid inferior and apical  inferior location.  The left ventricular ejection fraction is normal (55-65%).  Findings consistent with ischemia.  This is an intermediate risk study    Past Medical History:  Diagnosis Date  . Arthritis   . Fibromyalgia   . GERD (gastroesophageal reflux disease)   . Hyperlipidemia   . Hypertension   . Osteoarthritis of knee   . Painful urination   . Peripheral vascular disease ([place])    bilateral edema     Past Surgical History:  Procedure Laterality Date  . KNEE ARTHROSCOPY Left 6-7 years ago  . TONSILLECTOMY    . 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 aspirin EC 81 MG tablet Take 81 mg by mouth daily.  . cetirizine (ZYRTEC) 10 MG tablet Take 10 mg by mouth daily.  . Cholecalciferol (VITAMIN D) 2000 units CAPS Take by mouth.  Marland Kitchen lisinopril (PRINIVIL,ZESTRIL) 10 MG tablet Take 10 mg by mouth daily.  . metoprolol tartrate (LOPRESSOR) 50 MG tablet TAKE 1 TABLET BY MOUTH ONCE FOR 1 DOSE. TAKE 1 HOUR BEFORE CARDIAC CTA.  . Multiple Vitamin (MULTIVITAMIN WITH MINERALS) TABS tablet Take 1 tablet by mouth daily.  . naproxen sodium (ALEVE) 220 MG tablet Take 220 mg by mouth daily.   . Omega-3 Fatty Acids (FISH OIL PO) Take 1,290 mg by mouth daily.  Allergies:   Patient has no known allergies.   Social History   Socioeconomic History  . Marital status: Married    Spouse name: None  . Number of children: None  . Years of education: None  . Highest education level: None  Social Needs  . Financial resource strain: None  . Food insecurity - worry: None  . Food insecurity - inability: None  . Transportation needs - medical: None  . Transportation needs - non-medical: None  Occupational History  . None  Tobacco Use  . Smoking status: Former Smoker    Last attempt to quit:  03/16/1987    Years since quitting: 30.0  . Smokeless tobacco: Never Used  . Tobacco comment: quit 30 yeara ago   Substance and Sexual Activity  . Alcohol use: Yes    Alcohol/week: 5.4 oz    Types: 7 Glasses of wine, 2 Shots of liquor per week  . Drug use: No  . Sexual activity: None  Other Topics Concern  . None  Social History Narrative  . None     Family History: The patient's family history is not on file. ROS:   Please see the history of present illness.    All other systems reviewed and are negative.  EKGs/Labs/Other Studies Reviewed:    The following studies were reviewed today:  EKG:  EKG ordered today.  The ekg ordered today demonstrates [place] normal  Recent Labs: 03/08/2017: ALT 25; B Natriuretic Peptide 18.0; Hemoglobin 14.2; Platelets 202 03/19/2017: BUN 18; Creatinine, Ser 0.90; NT-Pro BNP 43; Potassium 4.7; Sodium 143  Recent Lipid Panel No results found for: CHOL, TRIG, HDL, CHOLHDL, VLDL, LDLCALC, LDLDIRECT  Physical Exam:    VS:  BP (!) 172/96 (BP Location: Right Arm, Patient Position: Sitting, Cuff Size: Large)   Pulse 97   Ht 6' (1.829 m)   Wt 287 lb (130.2 kg)   SpO2 98%   BMI 38.92 kg/m     Wt Readings from Last 3 Encounters:  04/11/17 287 lb (130.2 kg)  04/08/17 288 lb (130.6 kg)  03/19/17 288 lb (130.6 kg)     GEN:  Well nourished, well developed in no acute distress HEENT: Normal NECK: No JVD; No carotid bruits LYMPHATICS: No lymphadenopathy CARDIAC: RRR, no murmurs, rubs, gallops RESPIRATORY:  Clear to auscultation without rales, wheezing or rhonchi  ABDOMEN: Soft, non-tender, non-distended MUSCULOSKELETAL:  No edema; No deformity  SKIN: Warm and dry NEUROLOGIC:  Alert and oriented x 3 PSYCHIATRIC:  Normal affect    Signed, Shirlee More, MD  04/11/2017 10:22 AM    [facility]

## 2017-04-11 NOTE — H&P (View-Only) (Signed)
Cardiology Office Note:    Date:  04/11/2017   ID:  Todd Chan, DOB 02-May-1945, MRN [id number]  PCP:  Orpah Melter, MD  Cardiologist:  Shirlee More, MD    Referring MD: Orpah Melter, MD    ASSESSMENT:    1. Chest pain in adult   2. Abnormal myocardial perfusion study    PLAN:    In order of problems listed above:  1. With pending joint replacement surgery symptoms abnormal myocardial perfusion study is referred for left heart catheterization and possible PCI.  Options benefits and risks detailed with the patient and his spouse he has no dye allergy renal insufficiency or contraindication to dual antiplatelet therapy his hypertension is poorly controlled he will start the beta-blocker if prescribed. 2. Referral to left heart catheterization   Next appointment: 2 weeks   Medication Adjustments/Labs and Tests Ordered: Current medicines are reviewed at length with the patient today.  Concerns regarding medicines are outlined above.  Orders Placed This Encounter  Procedures  . DG Chest 2 View  . CBC with Differential/Platelet  . Basic metabolic panel  . Protime-INR  . EKG 12-Lead   No orders of the defined types were placed in this encounter.   Chief Complaint  Patient presents with  . Follow-up    History of Present Illness:    Todd Chan is a 72 y.o. male with a hx of chest pain and a recent abnormal MPI last seen 03/19/17.. Compliance with diet, lifestyle and medications: Yes He has pending orthopedic surgery has been canceled. He is quite apprehensive and has a vague awareness of his chest at times momentary not severe but remains limited in activities with exertional shortness of breath.  No palpitations syncope or TIA   Study Highlights 04/09/17   Nuclear stress EF: 62%.  There was no ST segment deviation noted during stress.  Defect 1: There is a medium defect of moderate severity present in the basal inferior, mid inferior and apical  inferior location.  The left ventricular ejection fraction is normal (55-65%).  Findings consistent with ischemia.  This is an intermediate risk study    Past Medical History:  Diagnosis Date  . Arthritis   . Fibromyalgia   . GERD (gastroesophageal reflux disease)   . Hyperlipidemia   . Hypertension   . Osteoarthritis of knee   . Painful urination   . Peripheral vascular disease ([place])    bilateral edema     Past Surgical History:  Procedure Laterality Date  . KNEE ARTHROSCOPY Left 6-7 years ago  . TONSILLECTOMY    . 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 aspirin EC 81 MG tablet Take 81 mg by mouth daily.  . cetirizine (ZYRTEC) 10 MG tablet Take 10 mg by mouth daily.  . Cholecalciferol (VITAMIN D) 2000 units CAPS Take by mouth.  Marland Kitchen lisinopril (PRINIVIL,ZESTRIL) 10 MG tablet Take 10 mg by mouth daily.  . metoprolol tartrate (LOPRESSOR) 50 MG tablet TAKE 1 TABLET BY MOUTH ONCE FOR 1 DOSE. TAKE 1 HOUR BEFORE CARDIAC CTA.  . Multiple Vitamin (MULTIVITAMIN WITH MINERALS) TABS tablet Take 1 tablet by mouth daily.  . naproxen sodium (ALEVE) 220 MG tablet Take 220 mg by mouth daily.   . Omega-3 Fatty Acids (FISH OIL PO) Take 1,290 mg by mouth daily.  Allergies:   Patient has no known allergies.   Social History   Socioeconomic History  . Marital status: Married    Spouse name: None  . Number of children: None  . Years of education: None  . Highest education level: None  Social Needs  . Financial resource strain: None  . Food insecurity - worry: None  . Food insecurity - inability: None  . Transportation needs - medical: None  . Transportation needs - non-medical: None  Occupational History  . None  Tobacco Use  . Smoking status: Former Smoker    Last attempt to quit:  03/16/1987    Years since quitting: 30.0  . Smokeless tobacco: Never Used  . Tobacco comment: quit 30 yeara ago   Substance and Sexual Activity  . Alcohol use: Yes    Alcohol/week: 5.4 oz    Types: 7 Glasses of wine, 2 Shots of liquor per week  . Drug use: No  . Sexual activity: None  Other Topics Concern  . None  Social History Narrative  . None     Family History: The patient's family history is not on file. ROS:   Please see the history of present illness.    All other systems reviewed and are negative.  EKGs/Labs/Other Studies Reviewed:    The following studies were reviewed today:  EKG:  EKG ordered today.  The ekg ordered today demonstrates [place] normal  Recent Labs: 03/08/2017: ALT 25; B Natriuretic Peptide 18.0; Hemoglobin 14.2; Platelets 202 03/19/2017: BUN 18; Creatinine, Ser 0.90; NT-Pro BNP 43; Potassium 4.7; Sodium 143  Recent Lipid Panel No results found for: CHOL, TRIG, HDL, CHOLHDL, VLDL, LDLCALC, LDLDIRECT  Physical Exam:    VS:  BP (!) 172/96 (BP Location: Right Arm, Patient Position: Sitting, Cuff Size: Large)   Pulse 97   Ht 6' (1.829 m)   Wt 287 lb (130.2 kg)   SpO2 98%   BMI 38.92 kg/m     Wt Readings from Last 3 Encounters:  04/11/17 287 lb (130.2 kg)  04/08/17 288 lb (130.6 kg)  03/19/17 288 lb (130.6 kg)     GEN:  Well nourished, well developed in no acute distress HEENT: Normal NECK: No JVD; No carotid bruits LYMPHATICS: No lymphadenopathy CARDIAC: RRR, no murmurs, rubs, gallops RESPIRATORY:  Clear to auscultation without rales, wheezing or rhonchi  ABDOMEN: Soft, non-tender, non-distended MUSCULOSKELETAL:  No edema; No deformity  SKIN: Warm and dry NEUROLOGIC:  Alert and oriented x 3 PSYCHIATRIC:  Normal affect    Signed, Shirlee More, MD  04/11/2017 10:22 AM    [facility]

## 2017-04-12 LAB — CBC WITH DIFFERENTIAL/PLATELET
BASOS ABS: 0 10*3/uL (ref 0.0–0.2)
Basos: 1 %
EOS (ABSOLUTE): 0.1 10*3/uL (ref 0.0–0.4)
Eos: 1 %
Hematocrit: 44.9 % (ref 37.5–51.0)
Hemoglobin: 15.3 g/dL (ref 13.0–17.7)
Immature Grans (Abs): 0 10*3/uL (ref 0.0–0.1)
Immature Granulocytes: 1 %
Lymphocytes Absolute: 1.3 10*3/uL (ref 0.7–3.1)
Lymphs: 23 %
MCH: 33.2 pg — ABNORMAL HIGH (ref 26.6–33.0)
MCHC: 34.1 g/dL (ref 31.5–35.7)
MCV: 97 fL (ref 79–97)
MONOS ABS: 0.7 10*3/uL (ref 0.1–0.9)
Monocytes: 12 %
NEUTROS PCT: 62 %
Neutrophils Absolute: 3.6 10*3/uL (ref 1.4–7.0)
PLATELETS: 228 10*3/uL (ref 150–379)
RBC: 4.61 x10E6/uL (ref 4.14–5.80)
RDW: 14.5 % (ref 12.3–15.4)
WBC: 5.8 10*3/uL (ref 3.4–10.8)

## 2017-04-12 LAB — BASIC METABOLIC PANEL
BUN/Creatinine Ratio: 16 (ref 10–24)
BUN: 14 mg/dL (ref 8–27)
CHLORIDE: 102 mmol/L (ref 96–106)
CO2: 25 mmol/L (ref 20–29)
Calcium: 9.7 mg/dL (ref 8.6–10.2)
Creatinine, Ser: 0.9 mg/dL (ref 0.76–1.27)
GFR calc non Af Amer: 87 mL/min/{1.73_m2} (ref >59)
GFR, EST AFRICAN AMERICAN: 100 mL/min/{1.73_m2} (ref >59)
Glucose: 100 mg/dL — ABNORMAL HIGH (ref 65–99)
POTASSIUM: 4.6 mmol/L (ref 3.5–5.2)
Sodium: 142 mmol/L (ref 134–144)

## 2017-04-12 LAB — PROTIME-INR
INR: 1 (ref 0.8–1.2)
PROTHROMBIN TIME: 10.8 s (ref 9.1–12.0)

## 2017-04-16 ENCOUNTER — Telehealth: Payer: Self-pay

## 2017-04-16 NOTE — Telephone Encounter (Signed)
Patient contacted pre-catheterization at [facility] scheduled for:  04-17-2017 @ 0830 Verified arrival time and place:  [facility] @ 0630 Confirmed AM meds to be taken pre-cath with sip of water: Take ASA Confirmed patient has responsible person to drive home post procedure and observe patient for 24 hours:  yes Addl concerns:  none

## 2017-04-17 ENCOUNTER — Ambulatory Visit (HOSPITAL_COMMUNITY)
Admission: RE | Admit: 2017-04-17 | Discharge: 2017-04-17 | Disposition: A | Discharge status: Home / Self Care | Payer: Medicare Other | Source: Ambulatory Visit | Attending: Cardiovascular Disease | Admitting: Cardiovascular Disease

## 2017-04-17 ENCOUNTER — Encounter (HOSPITAL_COMMUNITY)
Admission: RE | Disposition: A | Discharge status: Home / Self Care | Payer: Self-pay | Source: Ambulatory Visit | Attending: Cardiovascular Disease

## 2017-04-17 ENCOUNTER — Encounter (HOSPITAL_COMMUNITY): Payer: Self-pay | Admitting: Cardiovascular Disease

## 2017-04-17 DIAGNOSIS — Z791 Long term (current) use of non-steroidal anti-inflammatories (NSAID): Secondary | ICD-10-CM | POA: Diagnosis not present

## 2017-04-17 DIAGNOSIS — Z79899 Other long term (current) drug therapy: Secondary | ICD-10-CM | POA: Insufficient documentation

## 2017-04-17 DIAGNOSIS — R0609 Other forms of dyspnea: Secondary | ICD-10-CM

## 2017-04-17 DIAGNOSIS — I10 Essential (primary) hypertension: Secondary | ICD-10-CM | POA: Insufficient documentation

## 2017-04-17 DIAGNOSIS — R9439 Abnormal result of other cardiovascular function study: Secondary | ICD-10-CM

## 2017-04-17 DIAGNOSIS — I739 Peripheral vascular disease, unspecified: Secondary | ICD-10-CM | POA: Diagnosis not present

## 2017-04-17 DIAGNOSIS — E785 Hyperlipidemia, unspecified: Secondary | ICD-10-CM | POA: Insufficient documentation

## 2017-04-17 DIAGNOSIS — M797 Fibromyalgia: Secondary | ICD-10-CM | POA: Diagnosis not present

## 2017-04-17 DIAGNOSIS — R079 Chest pain, unspecified: Secondary | ICD-10-CM | POA: Insufficient documentation

## 2017-04-17 DIAGNOSIS — I2510 Atherosclerotic heart disease of native coronary artery without angina pectoris: Secondary | ICD-10-CM | POA: Insufficient documentation

## 2017-04-17 DIAGNOSIS — Z87891 Personal history of nicotine dependence: Secondary | ICD-10-CM | POA: Diagnosis not present

## 2017-04-17 DIAGNOSIS — Z96652 Presence of left artificial knee joint: Secondary | ICD-10-CM | POA: Insufficient documentation

## 2017-04-17 DIAGNOSIS — Z7982 Long term (current) use of aspirin: Secondary | ICD-10-CM | POA: Diagnosis not present

## 2017-04-17 HISTORY — PX: LEFT HEART CATH AND CORONARY ANGIOGRAPHY: CATH118249

## 2017-04-17 SURGERY — LEFT HEART CATH AND CORONARY ANGIOGRAPHY
Anesthesia: LOCAL

## 2017-04-17 MED ORDER — ASPIRIN 81 MG PO CHEW: 81.0000 mg | CHEWABLE_TABLET | ORAL | Status: DC

## 2017-04-17 MED ORDER — MIDAZOLAM HCL 2 MG/2ML IJ SOLN
INTRAMUSCULAR | Status: DC | PRN
  Administered 2017-04-17: 2 mg via INTRAVENOUS

## 2017-04-17 MED ORDER — SODIUM CHLORIDE 0.9 % IV SOLN: 250.0000 mL | INTRAVENOUS | Status: DC | PRN

## 2017-04-17 MED ORDER — HEPARIN SODIUM (PORCINE) 1000 UNIT/ML IJ SOLN
INTRAMUSCULAR | Status: DC | PRN
  Administered 2017-04-17: 5000 [IU] via INTRAVENOUS

## 2017-04-17 MED ORDER — SODIUM CHLORIDE 0.9% FLUSH: 3.0000 mL | INTRAVENOUS | Status: DC | PRN

## 2017-04-17 MED ORDER — ACETAMINOPHEN 325 MG PO TABS: 650.0000 mg | ORAL_TABLET | ORAL | Status: DC | PRN

## 2017-04-17 MED ORDER — DIAZEPAM 5 MG PO TABS
ORAL_TABLET | ORAL | Status: AC
  Filled 2017-04-17: qty 1

## 2017-04-17 MED ORDER — LIDOCAINE HCL (PF) 1 % IJ SOLN
INTRAMUSCULAR | Status: DC | PRN
  Administered 2017-04-17: 2 mL

## 2017-04-17 MED ORDER — ONDANSETRON HCL 4 MG/2ML IJ SOLN: 4.0000 mg | Freq: Four times a day (QID) | INTRAMUSCULAR | Status: DC | PRN

## 2017-04-17 MED ORDER — IOPAMIDOL (ISOVUE-370) INJECTION 76%
INTRAVENOUS | Status: AC
  Filled 2017-04-17: qty 100

## 2017-04-17 MED ORDER — MIDAZOLAM HCL 2 MG/2ML IJ SOLN
INTRAMUSCULAR | Status: AC
  Filled 2017-04-17: qty 2

## 2017-04-17 MED ORDER — FENTANYL CITRATE (PF) 100 MCG/2ML IJ SOLN
INTRAMUSCULAR | Status: AC
  Filled 2017-04-17: qty 2

## 2017-04-17 MED ORDER — HEPARIN (PORCINE) IN NACL 2-0.9 UNIT/ML-% IJ SOLN
INTRAMUSCULAR | Status: AC
  Filled 2017-04-17: qty 1000

## 2017-04-17 MED ORDER — SODIUM CHLORIDE 0.9% FLUSH: 3.0000 mL | Freq: Two times a day (BID) | INTRAVENOUS | Status: DC

## 2017-04-17 MED ORDER — SODIUM CHLORIDE 0.9 % WEIGHT BASED INFUSION
3.0000 mL/kg/h | INTRAVENOUS | Status: AC
  Administered 2017-04-17: 3 mL/kg/h via INTRAVENOUS

## 2017-04-17 MED ORDER — FENTANYL CITRATE (PF) 100 MCG/2ML IJ SOLN
INTRAMUSCULAR | Status: DC | PRN
  Administered 2017-04-17: 25 ug via INTRAVENOUS

## 2017-04-17 MED ORDER — HEPARIN (PORCINE) IN NACL 2-0.9 UNIT/ML-% IJ SOLN
INTRAMUSCULAR | Status: AC | PRN
  Administered 2017-04-17: 1000 mL

## 2017-04-17 MED ORDER — VERAPAMIL HCL 2.5 MG/ML IV SOLN
INTRAVENOUS | Status: AC
  Filled 2017-04-17: qty 2

## 2017-04-17 MED ORDER — LIDOCAINE HCL (PF) 1 % IJ SOLN
INTRAMUSCULAR | Status: AC
  Filled 2017-04-17: qty 30

## 2017-04-17 MED ORDER — VERAPAMIL HCL 2.5 MG/ML IV SOLN
INTRAVENOUS | Status: DC | PRN
  Administered 2017-04-17: 10 mL via INTRA_ARTERIAL

## 2017-04-17 MED ORDER — SODIUM CHLORIDE 0.9 % IV SOLN: INTRAVENOUS | Status: DC

## 2017-04-17 MED ORDER — SODIUM CHLORIDE 0.9 % WEIGHT BASED INFUSION: 1.0000 mL/kg/h | INTRAVENOUS | Status: DC

## 2017-04-17 MED ORDER — IOPAMIDOL (ISOVUE-370) INJECTION 76%
INTRAVENOUS | Status: DC | PRN
  Administered 2017-04-17: 90 mL via INTRA_ARTERIAL

## 2017-04-17 MED ORDER — HEPARIN SODIUM (PORCINE) 1000 UNIT/ML IJ SOLN
INTRAMUSCULAR | Status: AC
  Filled 2017-04-17: qty 1

## 2017-04-17 MED ORDER — DIAZEPAM 5 MG PO TABS
5.0000 mg | ORAL_TABLET | ORAL | Status: DC | PRN
  Administered 2017-04-17: 5 mg via ORAL

## 2017-04-17 SURGICAL SUPPLY — 15 items
CATH INFINITI 5 FR JL3.5 (CATHETERS) ×2 IMPLANT
CATH INFINITI 5FR ANG PIGTAIL (CATHETERS) ×2 IMPLANT
CATH INFINITI 5FR JL4 (CATHETERS) ×2 IMPLANT
CATH INFINITI JR4 5F (CATHETERS) ×2 IMPLANT
CATH OPTITORQUE TIG 4.0 5F (CATHETERS) ×2 IMPLANT
DEVICE RAD COMP TR BAND LRG (VASCULAR PRODUCTS) ×2 IMPLANT
GLIDESHEATH SLEND SS 6F .021 (SHEATH) ×2 IMPLANT
GUIDEWIRE INQWIRE 1.5J.035X260 (WIRE) ×1 IMPLANT
INQWIRE 1.5J .035X260CM (WIRE) ×2
KIT HEART LEFT (KITS) ×2 IMPLANT
PACK CARDIAC CATHETERIZATION (CUSTOM PROCEDURE TRAY) ×2 IMPLANT
SYR MEDRAD MARK V 150ML (SYRINGE) ×2 IMPLANT
TRANSDUCER W/STOPCOCK (MISCELLANEOUS) ×2 IMPLANT
TUBING CIL FLEX 10 FLL-RA (TUBING) ×2 IMPLANT
WIRE HI TORQ VERSACORE-J 145CM (WIRE) ×2 IMPLANT

## 2017-04-17 NOTE — Research (Signed)
CADFEM Informed Consent   Subject Name: Todd Chan  Subject met inclusion and exclusion criteria.  The informed consent form, study requirements and expectations were reviewed with the subject and questions and concerns were addressed prior to the signing of the consent form.  The subject verbalized understanding of the trail requirements.  The subject agreed to participate in the CADFEM trial and signed the informed consent.  The informed consent was obtained prior to performance of any protocol-specific procedures for the subject.  A copy of the signed informed consent was given to the subject and a copy was placed in the subject's medical record.  [name] 04/17/2017, 0725 am

## 2017-04-17 NOTE — Interval H&P Note (Signed)
Cath Lab Visit (complete for each Cath Lab visit)  Clinical Evaluation Leading to the Procedure:   ACS: No.  Non-ACS:    Anginal Classification: CCS II  Anti-ischemic medical therapy: Minimal Therapy (1 class of medications)  Non-Invasive Test Results: Intermediate-risk stress test findings: cardiac mortality 1-3%/year  Prior CABG: No previous CABG      History and Physical Interval Note:  04/17/2017 8:41 AM  Brien Few  has presented today for surgery, with the diagnosis of abnormal stress test  The various methods of treatment have been discussed with the patient and family. After consideration of risks, benefits and other options for treatment, the patient has consented to  Procedure(s): LEFT HEART CATH AND CORONARY ANGIOGRAPHY (N/A) as a surgical intervention .  The patient's history has been reviewed, patient examined, no change in status, stable for surgery.  I have reviewed the patient's chart and labs.  Questions were answered to the patient's satisfaction.     Shelva Majestic

## 2017-04-17 NOTE — Interval H&P Note (Signed)
History and Physical Interval Note:  04/17/2017 8:41 AM  Todd Chan  has presented today for surgery, with the diagnosis of abnormal stress test  The various methods of treatment have been discussed with the patient and family. After consideration of risks, benefits and other options for treatment, the patient has consented to  Procedure(s): LEFT HEART CATH AND CORONARY ANGIOGRAPHY (N/A) as a surgical intervention .  The patient's history has been reviewed, patient examined, no change in status, stable for surgery.  I have reviewed the patient's chart and labs.  Questions were answered to the patient's satisfaction.     Shelva Majestic

## 2017-04-17 NOTE — Research (Signed)
OPTIMIZE Informed Consent   Subject Name: Todd Chan  Subject met inclusion and exclusion criteria.  The informed consent form, study requirements and expectations were reviewed with the subject and questions and concerns were addressed prior to the signing of the consent form.  The subject verbalized understanding of the trail requirements.  The subject agreed to participate in the OPTIMIZE trial and signed the informed consent.  The informed consent was obtained prior to performance of any protocol-specific procedures for the subject.  A copy of the signed informed consent was given to the subject and a copy was placed in the subject's medical record. Only applicable if randomized.   [name] 04/17/2017, 0800 AM

## 2017-04-17 NOTE — Discharge Instructions (Signed)

## 2017-04-23 NOTE — Progress Notes (Signed)
Cardiology Office Note:    Date:  04/25/2017   ID:  Todd Chan, DOB [birth year], MRN [id number]  PCP:  Orpah Melter, MD  Cardiologist:  Shirlee More, MD    Referring MD: Orpah Melter, MD ,Dr Maureen Ralphs   ASSESSMENT:    1. Atherosclerosis of native coronary artery of native heart without angina pectoris   2. Gastric reflux   3. Essential hypertension    PLAN:    In order of problems listed above:  1. Stable coronary angiography shows minimal atherosclerosis he is optimized and ready for total knee replacement.  He continues to have atypical nonexertional chest pain that has both elements of musculoskeletal beginning in the back and radiating to the front associated with back pain as well as reflux associated with heartburn and belching.  I placed him on a PPI as a trial of medical therapy. 2. Start PPI 3. Stable continue current medical treatment   Next appointment: One year   Medication Adjustments/Labs and Tests Ordered: Current medicines are reviewed at length with the patient today.  Concerns regarding medicines are outlined above.  No orders of the defined types were placed in this encounter.  Meds ordered this encounter  Medications  . pantoprazole (PROTONIX) 40 MG tablet    Sig: Take 1 tablet (40 mg total) by mouth daily.    Dispense:  30 tablet    Refill:  11    Chief Complaint  Patient presents with  . Follow-up    History of Present Illness:    Todd Chan is a 72 y.o. male with a hx of chest pain and normal MPI  last seen 2 weeks ago. Compliance with diet, lifestyle and medications: Yes He is reassured by findings of coronary angiography and anticipates right total knee replacement.  He still troubled by episodic chest discomfort and is vague mild at times associated with back pain position and other times GI in nature associated with belching and heartburn relieved with antacid.  After discussion he requested a trial of PPI  Left heart cath  04/17/17:  Normal LV function without focal wall motion abnormalities.  EF 55-60%. No significant coronary obstructive disease, although there is a small area of calcification superior to the ostium of the a short left main, 10-15% luminal irregularity of the proximal LAD and a normal ramus intermediate, left circumflex, and dominant RCA. Past Medical History:  Diagnosis Date  . Arthritis   . Fibromyalgia   . GERD (gastroesophageal reflux disease)   . Hyperlipidemia   . Hypertension   . Osteoarthritis of knee   . Painful urination   . Peripheral vascular disease ([place])    bilateral edema     Past Surgical History:  Procedure Laterality Date  . KNEE ARTHROSCOPY Left 6-7 years ago  . LEFT HEART CATH AND CORONARY ANGIOGRAPHY N/A 04/17/2017   Procedure: LEFT HEART CATH AND CORONARY ANGIOGRAPHY;  Surgeon: Troy Sine, MD;  Location: [place] [facility];  Service: Cardiovascular;  Laterality: N/A;  . TONSILLECTOMY    . 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 aspirin EC 81 MG tablet Take 81 mg by mouth daily.  . Cholecalciferol (VITAMIN D) 2000 units CAPS Take 1 capsule by mouth daily.   Marland Kitchen lisinopril (PRINIVIL,ZESTRIL) 10 MG tablet Take 10 mg by mouth daily.  Marland Kitchen  metoprolol tartrate (LOPRESSOR) 25 MG tablet Take 1 tablet (25 mg total) by mouth 2 (two) times daily.  . Multiple Vitamin (MULTIVITAMIN WITH MINERALS) TABS tablet Take 1 tablet by mouth daily.  . naproxen sodium (ALEVE) 220 MG tablet Take 220 mg by mouth daily.   . Omega-3 Fatty Acids (FISH OIL PO) Take 1,290 mg by mouth daily.     Allergies:   Patient has no known allergies.   Social History   Socioeconomic History  . Marital status: Married    Spouse name: None  . Number of children: None  . Years of education: None  . Highest  education level: None  Social Needs  . Financial resource strain: None  . Food insecurity - worry: None  . Food insecurity - inability: None  . Transportation needs - medical: None  . Transportation needs - non-medical: None  Occupational History  . None  Tobacco Use  . Smoking status: Former Smoker    Last attempt to quit: 03/16/1987    Years since quitting: 30.1  . Smokeless tobacco: Never Used  . Tobacco comment: quit 30 yeara ago   Substance and Sexual Activity  . Alcohol use: Yes    Alcohol/week: 5.4 oz    Types: 7 Glasses of wine, 2 Shots of liquor per week  . Drug use: No  . Sexual activity: None  Other Topics Concern  . None  Social History Narrative  . None     Family History: The patient's family history is not on file. ROS:   Please see the history of present illness.    All other systems reviewed and are negative.  EKGs/Labs/Other Studies Reviewed:    The following studies were reviewed today  Recent Labs: 03/08/2017: ALT 25; B Natriuretic Peptide 18.0 03/19/2017: NT-Pro BNP 43 04/11/2017: BUN 14; Creatinine, Ser 0.90; Hemoglobin 15.3; Platelets 228; Potassium 4.6; Sodium 142  Recent Lipid Panel No results found for: CHOL, TRIG, HDL, CHOLHDL, VLDL, LDLCALC, LDLDIRECT  Physical Exam:    VS:  BP 130/80 (BP Location: Right Arm, Patient Position: Sitting, Cuff Size: Normal)   Pulse 67   Ht 6' (1.829 m)   Wt 283 lb (128.4 kg)   SpO2 97%   BMI 38.38 kg/m     Wt Readings from Last 3 Encounters:  04/25/17 283 lb (128.4 kg)  04/17/17 280 lb (127 kg)  04/11/17 287 lb (130.2 kg)     GEN:  Well nourished, well developed in no acute distress HEENT: Normal NECK: No JVD; No carotid bruits LYMPHATICS: No lymphadenopathy CARDIAC: He has no chest wall tenderness RRR, no murmurs, rubs, gallops RESPIRATORY:  Clear to auscultation without rales, wheezing or rhonchi  ABDOMEN: Soft, non-tender, non-distended MUSCULOSKELETAL:  No edema; No deformity  SKIN: Warm  and dry NEUROLOGIC:  Alert and oriented x 3 PSYCHIATRIC:  Normal affect    Signed, Shirlee More, MD  04/25/2017 11:32 AM    [place]

## 2017-04-25 ENCOUNTER — Ambulatory Visit (INDEPENDENT_AMBULATORY_CARE_PROVIDER_SITE_OTHER): Payer: Medicare Other | Admitting: Cardiology

## 2017-04-25 ENCOUNTER — Encounter: Payer: Self-pay | Admitting: Cardiology

## 2017-04-25 VITALS — BP 130/80 | HR 67 | Ht 72.0 in | Wt 283.0 lb

## 2017-04-25 DIAGNOSIS — K219 Gastro-esophageal reflux disease without esophagitis: Secondary | ICD-10-CM

## 2017-04-25 DIAGNOSIS — I2510 Atherosclerotic heart disease of native coronary artery without angina pectoris: Secondary | ICD-10-CM | POA: Diagnosis not present

## 2017-04-25 DIAGNOSIS — I10 Essential (primary) hypertension: Secondary | ICD-10-CM

## 2017-04-25 MED ORDER — PANTOPRAZOLE SODIUM 40 MG PO TBEC: 40.0000 mg | DELAYED_RELEASE_TABLET | Freq: Every day | ORAL | 11 refills | Status: DC

## 2017-04-25 NOTE — Patient Instructions (Signed)
Medication Instructions:  Your physician has recommended you make the following change in your medication:  START pantoprazole (Protonix) 40 mg daily  Labwork: None  Testing/Procedures: None  Follow-Up: Your physician wants you to follow-up in: 1 year. You will receive a reminder letter in the mail two months in advance. If you don't receive a letter, please call our office to schedule the follow-up appointment.  Any Other Special Instructions Will Be Listed Below (If Applicable).     If you need a refill on your cardiac medications before your next appointment, please call your pharmacy.

## 2017-04-29 DIAGNOSIS — M5134 Other intervertebral disc degeneration, thoracic region: Secondary | ICD-10-CM | POA: Diagnosis not present

## 2017-04-29 DIAGNOSIS — M5033 Other cervical disc degeneration, cervicothoracic region: Secondary | ICD-10-CM | POA: Diagnosis not present

## 2017-04-29 DIAGNOSIS — M9902 Segmental and somatic dysfunction of thoracic region: Secondary | ICD-10-CM | POA: Diagnosis not present

## 2017-04-29 DIAGNOSIS — M9901 Segmental and somatic dysfunction of cervical region: Secondary | ICD-10-CM | POA: Diagnosis not present

## 2017-05-01 DIAGNOSIS — M5134 Other intervertebral disc degeneration, thoracic region: Secondary | ICD-10-CM | POA: Diagnosis not present

## 2017-05-01 DIAGNOSIS — M5033 Other cervical disc degeneration, cervicothoracic region: Secondary | ICD-10-CM | POA: Diagnosis not present

## 2017-05-01 DIAGNOSIS — M9901 Segmental and somatic dysfunction of cervical region: Secondary | ICD-10-CM | POA: Diagnosis not present

## 2017-05-01 DIAGNOSIS — M9902 Segmental and somatic dysfunction of thoracic region: Secondary | ICD-10-CM | POA: Diagnosis not present

## 2017-05-03 DIAGNOSIS — M9902 Segmental and somatic dysfunction of thoracic region: Secondary | ICD-10-CM | POA: Diagnosis not present

## 2017-05-03 DIAGNOSIS — M9901 Segmental and somatic dysfunction of cervical region: Secondary | ICD-10-CM | POA: Diagnosis not present

## 2017-05-03 DIAGNOSIS — M5033 Other cervical disc degeneration, cervicothoracic region: Secondary | ICD-10-CM | POA: Diagnosis not present

## 2017-05-03 DIAGNOSIS — M5134 Other intervertebral disc degeneration, thoracic region: Secondary | ICD-10-CM | POA: Diagnosis not present

## 2017-05-06 DIAGNOSIS — M9901 Segmental and somatic dysfunction of cervical region: Secondary | ICD-10-CM | POA: Diagnosis not present

## 2017-05-06 DIAGNOSIS — M9902 Segmental and somatic dysfunction of thoracic region: Secondary | ICD-10-CM | POA: Diagnosis not present

## 2017-05-06 DIAGNOSIS — M5134 Other intervertebral disc degeneration, thoracic region: Secondary | ICD-10-CM | POA: Diagnosis not present

## 2017-05-06 DIAGNOSIS — M5033 Other cervical disc degeneration, cervicothoracic region: Secondary | ICD-10-CM | POA: Diagnosis not present

## 2017-05-08 DIAGNOSIS — K219 Gastro-esophageal reflux disease without esophagitis: Secondary | ICD-10-CM | POA: Diagnosis not present

## 2017-05-08 DIAGNOSIS — M9901 Segmental and somatic dysfunction of cervical region: Secondary | ICD-10-CM | POA: Diagnosis not present

## 2017-05-08 DIAGNOSIS — M5134 Other intervertebral disc degeneration, thoracic region: Secondary | ICD-10-CM | POA: Diagnosis not present

## 2017-05-08 DIAGNOSIS — M5033 Other cervical disc degeneration, cervicothoracic region: Secondary | ICD-10-CM | POA: Diagnosis not present

## 2017-05-08 DIAGNOSIS — M9902 Segmental and somatic dysfunction of thoracic region: Secondary | ICD-10-CM | POA: Diagnosis not present

## 2017-05-09 ENCOUNTER — Other Ambulatory Visit: Payer: Self-pay

## 2017-05-09 MED ORDER — METOPROLOL TARTRATE 25 MG PO TABS: 25.0000 mg | ORAL_TABLET | Freq: Two times a day (BID) | ORAL | 3 refills | Status: DC

## 2017-05-10 DIAGNOSIS — M9902 Segmental and somatic dysfunction of thoracic region: Secondary | ICD-10-CM | POA: Diagnosis not present

## 2017-05-10 DIAGNOSIS — M5033 Other cervical disc degeneration, cervicothoracic region: Secondary | ICD-10-CM | POA: Diagnosis not present

## 2017-05-10 DIAGNOSIS — M9901 Segmental and somatic dysfunction of cervical region: Secondary | ICD-10-CM | POA: Diagnosis not present

## 2017-05-10 DIAGNOSIS — M5134 Other intervertebral disc degeneration, thoracic region: Secondary | ICD-10-CM | POA: Diagnosis not present

## 2017-05-13 DIAGNOSIS — M5033 Other cervical disc degeneration, cervicothoracic region: Secondary | ICD-10-CM | POA: Diagnosis not present

## 2017-05-13 DIAGNOSIS — M9901 Segmental and somatic dysfunction of cervical region: Secondary | ICD-10-CM | POA: Diagnosis not present

## 2017-05-13 DIAGNOSIS — M5134 Other intervertebral disc degeneration, thoracic region: Secondary | ICD-10-CM | POA: Diagnosis not present

## 2017-05-13 DIAGNOSIS — M9902 Segmental and somatic dysfunction of thoracic region: Secondary | ICD-10-CM | POA: Diagnosis not present

## 2017-05-15 DIAGNOSIS — M9902 Segmental and somatic dysfunction of thoracic region: Secondary | ICD-10-CM | POA: Diagnosis not present

## 2017-05-15 DIAGNOSIS — M5033 Other cervical disc degeneration, cervicothoracic region: Secondary | ICD-10-CM | POA: Diagnosis not present

## 2017-05-15 DIAGNOSIS — M9901 Segmental and somatic dysfunction of cervical region: Secondary | ICD-10-CM | POA: Diagnosis not present

## 2017-05-15 DIAGNOSIS — M5134 Other intervertebral disc degeneration, thoracic region: Secondary | ICD-10-CM | POA: Diagnosis not present

## 2017-05-17 DIAGNOSIS — M5033 Other cervical disc degeneration, cervicothoracic region: Secondary | ICD-10-CM | POA: Diagnosis not present

## 2017-05-17 DIAGNOSIS — M9901 Segmental and somatic dysfunction of cervical region: Secondary | ICD-10-CM | POA: Diagnosis not present

## 2017-05-17 DIAGNOSIS — M5134 Other intervertebral disc degeneration, thoracic region: Secondary | ICD-10-CM | POA: Diagnosis not present

## 2017-05-17 DIAGNOSIS — M9902 Segmental and somatic dysfunction of thoracic region: Secondary | ICD-10-CM | POA: Diagnosis not present

## 2017-05-21 DIAGNOSIS — M9901 Segmental and somatic dysfunction of cervical region: Secondary | ICD-10-CM | POA: Diagnosis not present

## 2017-05-21 DIAGNOSIS — M5134 Other intervertebral disc degeneration, thoracic region: Secondary | ICD-10-CM | POA: Diagnosis not present

## 2017-05-21 DIAGNOSIS — M9902 Segmental and somatic dysfunction of thoracic region: Secondary | ICD-10-CM | POA: Diagnosis not present

## 2017-05-21 DIAGNOSIS — M5033 Other cervical disc degeneration, cervicothoracic region: Secondary | ICD-10-CM | POA: Diagnosis not present

## 2017-05-22 ENCOUNTER — Ambulatory Visit (INDEPENDENT_AMBULATORY_CARE_PROVIDER_SITE_OTHER): Payer: Medicare Other | Admitting: Neurology

## 2017-05-22 ENCOUNTER — Encounter: Payer: Self-pay | Admitting: Neurology

## 2017-05-22 VITALS — BP 151/86 | HR 75 | Ht 72.0 in | Wt 289.0 lb

## 2017-05-22 DIAGNOSIS — R0681 Apnea, not elsewhere classified: Secondary | ICD-10-CM | POA: Diagnosis not present

## 2017-05-22 DIAGNOSIS — R200 Anesthesia of skin: Secondary | ICD-10-CM | POA: Diagnosis not present

## 2017-05-22 DIAGNOSIS — I2510 Atherosclerotic heart disease of native coronary artery without angina pectoris: Secondary | ICD-10-CM

## 2017-05-22 DIAGNOSIS — G629 Polyneuropathy, unspecified: Secondary | ICD-10-CM | POA: Diagnosis not present

## 2017-05-22 DIAGNOSIS — R2689 Other abnormalities of gait and mobility: Secondary | ICD-10-CM | POA: Diagnosis not present

## 2017-05-22 DIAGNOSIS — R0683 Snoring: Secondary | ICD-10-CM

## 2017-05-22 NOTE — Patient Instructions (Signed)
Peripheral Neuropathy Peripheral neuropathy is a type of nerve damage. It affects nerves that carry signals between the spinal cord and other parts of the body. These are called peripheral nerves. With peripheral neuropathy, one nerve or a group of nerves may be damaged. What are the causes? Many things can damage peripheral nerves. For some people with peripheral neuropathy, the cause is unknown. Some causes include:  Diabetes. This is the most common cause of peripheral neuropathy.  Injury to a nerve.  Pressure or stress on a nerve that lasts a long time.  Too little vitamin B. Alcoholism can lead to this.  Infections.  Autoimmune diseases, such as multiple sclerosis and systemic lupus erythematosus.  Inherited nerve diseases.  Some medicines, such as cancer drugs.  Toxic substances, such as lead and mercury.  Too little blood flowing to the legs.  Kidney disease.  Thyroid disease.  What are the signs or symptoms? Different people have different symptoms. The symptoms you have will depend on which of your nerves is damaged. Common symptoms include:  Loss of feeling (numbness) in the feet and hands.  Tingling in the feet and hands.  Pain that burns.  Very sensitive skin.  Weakness.  Not being able to move a part of the body (paralysis).  Muscle twitching.  Clumsiness or poor coordination.  Loss of balance.  Not being able to control your bladder.  Feeling dizzy.  Sexual problems.  How is this diagnosed? Peripheral neuropathy is a symptom, not a disease. Finding the cause of peripheral neuropathy can be hard. To figure that out, your health care provider will take a medical history and do a physical exam. A neurological exam will also be done. This involves checking things affected by your brain, spinal cord, and nerves (nervous system). For example, your health care provider will check your reflexes, how you move, and what you can feel. Other types of tests  may also be ordered, such as:  Blood tests.  A test of the fluid in your spinal cord.  Imaging tests, such as CT scans or an MRI.  Electromyography (EMG). This test checks the nerves that control muscles.  Nerve conduction velocity tests. These tests check how fast messages pass through your nerves.  Nerve biopsy. A small piece of nerve is removed. It is then checked under a microscope.  How is this treated?  Medicine is often used to treat peripheral neuropathy. Medicines may include: ? Pain-relieving medicines. Prescription or over-the-counter medicine may be suggested. ? Antiseizure medicine. This may be used for pain. ? Antidepressants. These also may help ease pain from neuropathy. ? Lidocaine. This is a numbing medicine. You might wear a patch or be given a shot. ? Mexiletine. This medicine is typically used to help control irregular heart rhythms.  Surgery. Surgery may be needed to relieve pressure on a nerve or to destroy a nerve that is causing pain.  Physical therapy to help movement.  Assistive devices to help movement. Follow these instructions at home:  Only take over-the-counter or prescription medicines as directed by your health care provider. Follow the instructions carefully for any given medicines. Do not take any other medicines without first getting approval from your health care provider.  If you have diabetes, work closely with your health care provider to keep your blood sugar under control.  If you have numbness in your feet: ? Check every day for signs of injury or infection. Watch for redness, warmth, and swelling. ? Wear padded socks and comfortable   shoes. These help protect your feet.  Do not do things that put pressure on your damaged nerve.  Do not smoke. Smoking keeps blood from getting to damaged nerves.  Avoid or limit alcohol. Too much alcohol can cause a lack of B vitamins. These vitamins are needed for healthy nerves.  Develop a good  support system. Coping with peripheral neuropathy can be stressful. Talk to a mental health specialist or join a support group if you are struggling.  Follow up with your health care provider as directed. Contact a health care provider if:  You have new signs or symptoms of peripheral neuropathy.  You are struggling emotionally from dealing with peripheral neuropathy.  You have a fever. Get help right away if:  You have an injury or infection that is not healing.  You feel very dizzy or begin vomiting.  You have chest pain.  You have trouble breathing. This information is not intended to replace advice given to you by your health care provider. Make sure you discuss any questions you have with your health care provider. Document Released: 03/09/2002 Document Revised: 08/25/2015 Document Reviewed: 11/24/2012 Elsevier Interactive Patient Education  2017 Elsevier Inc.  

## 2017-05-22 NOTE — Progress Notes (Signed)
[facility]    Provider:  Dr Jaynee Eagles Referring Provider: Orpah Melter, MD, Starlyn Skeans Primary Care Physician:  Orpah Melter, MD, Starlyn Skeans   CC:  Numbness in feet  HPI:  Todd Chan is a 72 y.o. male here as a referral from Dr. Olen Pel for neuropathy.  Past medical history hypertension, GERD.He had left knee replacement in July. A month after it started in the left leg and then the right. It is in the balls and the heels and the ankles are numb. A little in the toes. Not painful. No burning or tingling. He is fine in bed. Worse when on his feet for quite a bit. When he walks down a slope he feels a little imbalance. Here with his wife who also provides information. When he gets out of a car he can feel the numbness in the ankle. Not tight shoes. He is going to a chiropractor for shoulder pains, has helped. He has some low back pain but no radicular symptoms. He has knee problems, going for a knee replacement on the right knee. Stable. Wife is here and also provides information. No other focal neurologic deficits, associated symptoms, inciting events or modifiable factors.  Reviewed notes, labs and imaging from outside physicians, which showed:  Reviewed referring physician notes.  Since his left knee replacement he developed some numbness in both feet and ankles.  Strength was July 2009.  He is currently having some numbness in the balls of his feet and medial ankles.  Been there for 5-6 weeks.  The numbness comes and goes.  Not disruptive to him at all.  He will sometimes get worse with standing for long periods of time.  No weakness or changes in his balance.  He will sometimes feel very slightly wobbly but no falls.  He is never had any issues with anemia or vitamin deficiency.  No dietary restrictions.  Hemoglobin A1c 5.5, TSH 2.32, I don;t see a B12 value.   Review of Systems: Patient complains of symptoms per HPI as well as the following symptoms:  numbness. Pertinent negatives and positives per HPI. All others negative.   Social History   Socioeconomic History  . Marital status: Married    Spouse name: Not on file  . Number of children: 2  . Years of education: Not on file  . Highest education level: Bachelor's degree (e.g., BA, AB, BS)  Social Needs  . Financial resource strain: Not on file  . Food insecurity - worry: Not on file  . Food insecurity - inability: Not on file  . Transportation needs - medical: Not on file  . Transportation needs - non-medical: Not on file  Occupational History  . Not on file  Tobacco Use  . Smoking status: Former Smoker    Packs/day: 0.50    Last attempt to quit: 10/1987    Years since quitting: 29.6  . Smokeless tobacco: Never Used  . Tobacco comment: quit 30 yeara ago   Substance and Sexual Activity  . Alcohol use: Yes    Comment: 3 drinks per day- wine and scotch  . Drug use: No  . Sexual activity: Not on file  Other Topics Concern  . Not on file  Social History Narrative   Lives at home with his wife   Left handed   Drinks 2-3 cups of caffeine daily    Family History  Problem Relation Age of Onset  . Prostate cancer Father   . Breast cancer Sister   .  Neuropathy Neg Hx     Past Medical History:  Diagnosis Date  . Arthritis   . GERD (gastroesophageal reflux disease)   . Hypertension   . Osteoarthritis of knee   . Painful urination   . Peripheral vascular disease ([place])    bilateral edema     Past Surgical History:  Procedure Laterality Date  . KNEE ARTHROSCOPY Left 6-7 years ago  . LEFT HEART CATH AND CORONARY ANGIOGRAPHY N/A 04/17/2017   Procedure: LEFT HEART CATH AND CORONARY ANGIOGRAPHY;  Surgeon: Troy Sine, MD;  Location: [facility];  Service: Cardiovascular;  Laterality: N/A;  . 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 TONSILLECTOMY    . TOTAL KNEE ARTHROPLASTY Left 10/08/2016   Procedure: LEFT TOTAL KNEE ARTHROPLASTY;  Surgeon: Gaynelle Arabian,  MD;  Location: [facility];  Service: Orthopedics;  Laterality: Left;    Current Outpatient Medications  Medication Sig Dispense Refill  . aspirin EC 81 MG tablet Take 81 mg by mouth daily.    . Cholecalciferol (VITAMIN D) 2000 units CAPS Take 1 capsule by mouth daily.     Marland Kitchen lisinopril (PRINIVIL,ZESTRIL) 10 MG tablet Take 10 mg by mouth daily.    . metoprolol tartrate (LOPRESSOR) 25 MG tablet Take 1 tablet (25 mg total) by mouth 2 (two) times daily. 180 tablet 3  . Multiple Vitamin (MULTIVITAMIN WITH MINERALS) TABS tablet Take 1 tablet by mouth daily.    . naproxen sodium (ALEVE) 220 MG tablet Take 220 mg by mouth daily.     . Omega-3 Fatty Acids (FISH OIL PO) Take 1,290 mg by mouth daily.    . pantoprazole (PROTONIX) 40 MG tablet Take 1 tablet (40 mg total) by mouth daily. 30 tablet 11  . ranitidine (ZANTAC) 300 MG tablet Take 300 mg by mouth at bedtime.     No current facility-administered medications for this visit.     Allergies as of 05/22/2017  . (No Known Allergies)    Vitals: BP (!) 151/86 (BP Location: Right Arm, Patient Position: Sitting) Comment: pt reports elevated @ MD offices; usually 130s/80s at home  Pulse 75   Ht 6' (1.829 m)   Wt 289 lb (131.1 kg)   BMI 39.20 kg/m  Last Weight:  Wt Readings from Last 1 Encounters:  05/22/17 289 lb (131.1 kg)   Last Height:   Ht Readings from Last 1 Encounters:  05/22/17 6' (1.829 m)    Physical exam: Exam: Gen: NAD, conversant, well nourised, obese, well groomed                     CV: RRR, no MRG. No Carotid Bruits. No peripheral edema, warm, nontender Eyes: Conjunctivae clear without exudates or hemorrhage  Neuro: Detailed Neurologic Exam  Speech:    Speech is normal; fluent and spontaneous with normal comprehension.  Cognition:    The patient is oriented to person, place, and time;     recent and remote memory intact;     language fluent;     normal attention, concentration,     fund of knowledge Cranial  Nerves:    The pupils are equal, round, and reactive to light.attempted fundoscopic exam could not visualize.. Visual fields are full to finger confrontation. Extraocular movements are intact. Trigeminal sensation is intact and the muscles of mastication are normal. Left ptosis. The palate elevates in the midline. Hearing intact. Voice is normal. Shoulder shrug is normal. The tongue has normal motion without fasciculations.   Coordination:  Normal finger to nose and heel to shin. Normal rapid alternating movements.   Gait:    Heel-toe and tandem gait are normal.   Motor Observation:    No asymmetry, no atrophy, and no involuntary movements noted. Tone:    Normal muscle tone.    Posture:    Posture is normal. normal erect    Strength:    Strength is V/V in the upper and lower limbs.      Sensation: intact to LT, pin prick, temp distally in feet     Reflex Exam:  DTR's:      AJs 1+.  Toes:    The toes are downgoing bilaterally.   Clonus:    Clonus is absent.      Assessment/Plan: This is a very lovely 72 year old male with a past medical history of morbid obesity, knee pain and replacements, hypertension, GERD.  He is here for symptoms in the feet.  Symptoms started in the left foot after knee replacement and then in the right foot.    - Exam is essentially normal for distal pinprick, temperature and reflexes.    - Numbness in the heel is unusual for peripheral polyneuropathy which usually starts in the toes and proceeds proximally.  Symptoms started after knee surgery, complains of ankle symptoms as well, may be more mechanical in nature.  After knee replacement, patient started walking differently, he started wearing slippers more without arch supports.  Wonder if this is more structural such as plantar fasciitis or mechanical due to his change in gait and knee pain.  Recommend podiatry.  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   -He is morbidly obese, he stops breathing in the middle of the night per his wife.  I do recommend a sleep apnea evaluation, discussed sequelae of untreated sleep apnea including increased risk of stroke, cardiovascular, respiratory and others.  They declined at this time, encouraged him to speak with PCP about this. Weight loss is highly recommended.  -Patient complains of decreased balance when walking downhill, likely multifactorial including his knees, large body habitus, and not exercising or performing balance exercises.  Encourage physical therapy and balance, gait and safety evaluation and treatment.  He will be getting his right knee replaced soon and he will go to physical therapy after that.  Discussed fall risks.  -Return to neurology clinic as needed.   Cc:  Orpah Melter, MD, [facility]     Sarina Ill, MD  [facility] [address] [place], [place] [address]  Phone [phone] Fax [phone]

## 2017-05-23 DIAGNOSIS — M5033 Other cervical disc degeneration, cervicothoracic region: Secondary | ICD-10-CM | POA: Diagnosis not present

## 2017-05-23 DIAGNOSIS — M9902 Segmental and somatic dysfunction of thoracic region: Secondary | ICD-10-CM | POA: Diagnosis not present

## 2017-05-23 DIAGNOSIS — M5134 Other intervertebral disc degeneration, thoracic region: Secondary | ICD-10-CM | POA: Diagnosis not present

## 2017-05-23 DIAGNOSIS — M9901 Segmental and somatic dysfunction of cervical region: Secondary | ICD-10-CM | POA: Diagnosis not present

## 2017-05-27 DIAGNOSIS — M9901 Segmental and somatic dysfunction of cervical region: Secondary | ICD-10-CM | POA: Diagnosis not present

## 2017-05-27 DIAGNOSIS — M9902 Segmental and somatic dysfunction of thoracic region: Secondary | ICD-10-CM | POA: Diagnosis not present

## 2017-05-27 DIAGNOSIS — M5134 Other intervertebral disc degeneration, thoracic region: Secondary | ICD-10-CM | POA: Diagnosis not present

## 2017-05-27 DIAGNOSIS — M5033 Other cervical disc degeneration, cervicothoracic region: Secondary | ICD-10-CM | POA: Diagnosis not present

## 2017-05-27 LAB — ANA W/REFLEX: ANA: NEGATIVE

## 2017-05-27 LAB — HEAVY METALS, BLOOD
ARSENIC: 6 ug/L (ref 2–23)
LEAD, BLOOD: 2 ug/dL (ref 0–4)
Mercury: NOT DETECTED ug/L (ref 0.0–14.9)

## 2017-05-27 LAB — B. BURGDORFI ANTIBODIES

## 2017-05-27 LAB — SJOGREN'S SYNDROME ANTIBODS(SSA + SSB)
ENA SSA (RO) Ab: <0.2 AI (ref 0.0–0.9)
ENA SSB (LA) Ab: <0.2 AI (ref 0.0–0.9)

## 2017-05-27 LAB — RHEUMATOID FACTOR: Rhuematoid fact SerPl-aCnc: <10 IU/mL (ref 0.0–13.9)

## 2017-05-27 LAB — MULTIPLE MYELOMA PANEL, SERUM
ALPHA 1: 0.2 g/dL (ref 0.0–0.4)
Albumin SerPl Elph-Mcnc: 3.9 g/dL (ref 2.9–4.4)
Albumin/Glob SerPl: 1.5 (ref 0.7–1.7)
Alpha2 Glob SerPl Elph-Mcnc: 0.5 g/dL (ref 0.4–1.0)
B-Globulin SerPl Elph-Mcnc: 1 g/dL (ref 0.7–1.3)
Gamma Glob SerPl Elph-Mcnc: 1 g/dL (ref 0.4–1.8)
Globulin, Total: 2.7 g/dL (ref 2.2–3.9)
IGA/IMMUNOGLOBULIN A, SERUM: 256 mg/dL (ref 61–437)
IGG (IMMUNOGLOBIN G), SERUM: 1090 mg/dL (ref 700–1600)
IGM (IMMUNOGLOBULIN M), SRM: 70 mg/dL (ref 20–172)
Total Protein: 6.6 g/dL (ref 6.0–8.5)

## 2017-05-27 LAB — VITAMIN B1: THIAMINE: 136.6 nmol/L (ref 66.5–200.0)

## 2017-05-28 ENCOUNTER — Telehealth: Payer: Self-pay | Admitting: *Deleted

## 2017-05-28 DIAGNOSIS — R0789 Other chest pain: Secondary | ICD-10-CM | POA: Diagnosis not present

## 2017-05-28 DIAGNOSIS — R142 Eructation: Secondary | ICD-10-CM | POA: Diagnosis not present

## 2017-05-28 NOTE — Telephone Encounter (Signed)
Spoke with patient regarding normal labs. He verbalized appreciation & understanding.

## 2017-05-28 NOTE — Telephone Encounter (Signed)
-----   Message from Melvenia Beam, MD sent at 05/28/2017 12:53 PM EST ----- All labs normal

## 2017-05-30 ENCOUNTER — Ambulatory Visit: Payer: Self-pay | Admitting: Orthopedic Surgery

## 2017-05-30 DIAGNOSIS — M5134 Other intervertebral disc degeneration, thoracic region: Secondary | ICD-10-CM | POA: Diagnosis not present

## 2017-05-30 DIAGNOSIS — M5033 Other cervical disc degeneration, cervicothoracic region: Secondary | ICD-10-CM | POA: Diagnosis not present

## 2017-05-30 DIAGNOSIS — M9901 Segmental and somatic dysfunction of cervical region: Secondary | ICD-10-CM | POA: Diagnosis not present

## 2017-05-30 DIAGNOSIS — M9902 Segmental and somatic dysfunction of thoracic region: Secondary | ICD-10-CM | POA: Diagnosis not present

## 2017-05-30 NOTE — H&P (View-Only) (Signed)
Todd Chan, Todd Chan (72yo, [place]) DOB 29-May-1945   Chief Complaint Right Knee Pain H&P Right TKA for 06/17/2017  Patient's Pharmacies [id number]): [address], [place] [address], Ph [phone], Fax [phone]   Vitals Ht: 6 ft Wt: 280 lbs BMI: 38  BP: 134/82 sitting L arm  Pulse: 72 bpm regular   Allergies Reviewed Allergies NKDA  Medications Reviewed Medications Aleve 05/30/17   entered Todd Wadleigh, PA-C aspirin 81 mg chewable tablet Chew 1 tablet(s) every day by oral route. 05/30/17   entered Todd Anspach, PA-C Fish Oil 360 mg-1,200 mg capsule 05/30/17   entered Todd Godette, PA-C lisinopril 10 mg tablet 1 tablet(s) every day. 05/30/17   entered Todd Truett, PA-C metoprolol tartrate 25 mg tablet 1 tablet(s) twice a day., start 04/06/2017 04/06/17   filled [name], PA-C Multiple Vitamins 05/30/17   entered Todd Sroka, PA-C pantoprazole 40 mg tablet,delayed release 1 tablet(s) every day., start 05/03/2017 05/03/17   filled Todd Dubu[place], PA-C raNITIdine 300 mg tablet 05/08/17   filled Todd Chan Vitamin D3 2,000 unit tablet 1 tablet(s) every day. 05/30/17   entered Todd Alberto, PA-C   Family History Reviewed Family History Mother - Arthritis (onset age: 53) (died age: 27)   - Mother deceased   - Malignant neoplasm of bone Father - Father deceased Sister - Family history of malignant neoplasm of breast  Social History Reviewed Social History Smoking Status: Former smoker Non-smoker Tobacco-years of use: 20 Chewing tobacco: none Alcohol intake: Moderate Hand Dominance: Left Work related injury?: N Advance directive: Y (Notes: Living Will) Medical Power of Attorney: Y  Surgical History Reviewed Surgical History Cardiac catheterization Arthroscopy Knee Joint Replacement - 10/08/2016 - Left Total Knee Colonoscopy - 05/03/2014  Past Medical History Reviewed Past  Medical History Hypertension: Y Notes: Childhood Measels   HPI Patient is scheduled for a right total knee arthroplasty on 06/17/17 by Dr. Wynelle Chan at [facility]. The patient is a 72 year old male presenting several months out from his left total knee arthroplasty. The patient states that he is doing very well at this time. The pain is under excellent control at this time and describe their pain as 0 / 10 on a 10 point pain scale and some crunching with ROM. Overall he said that the LEFT knee is doing extremely well at this time. The RIGHT knee is becoming increasingly problematic and he wants to go ahead and get that replaced at this time. The RIGHT knee is bone-on-bone and he has failed injections in the past. He still exercises the LEFT knee. The swelling is much better at this time. He is very pleased with his results on the LEFT. He is now ready to proceed with the right side at this time. They have been treated conservatively in the past for the above stated problem and despite conservative measures, they continue to have progressive pain and severe functional limitations and dysfunction. They have failed non-operative management including home exercise, medications, and injections. It is felt that they would benefit from undergoing total joint replacement. Risks and benefits of the procedure have been discussed with the patient and they elect to proceed with surgery. There are no active contraindications to surgery such as ongoing infection or rapidly progressive neurological disease.   ROS Constitutional: Constitutional: no significant weight gain or loss and no fever.  HEENT: Eyes: no irritation, dry eyes, vision change, or sore throat.  Cardiovascular: Cardiovascular: no palpitations or chest pain; high blood pressure and some  shortness of breath.  Respiratory: Respiratory: shortness of breath.  Gastrointestinal: Gastrointestinal: no vomiting or diarrhea and not vomiting blood;  some belching.  Genitourinary: Genitourinary: no blood in urine or difficulty urinating.  Musculoskeletal: Musculoskeletal: Joint Pain; back pain.   Physical Exam Patient is a 72 year old male. General Mental Status - Alert, cooperative and good historian. General Appearance - pleasant, Not in acute distress. Orientation - Oriented X3. Build & Nutrition - Well nourished and Well developed.  Head and Neck Head - normocephalic, atraumatic . Neck Global Assessment - supple, no bruit auscultated on the right, no bruit auscultated on the left.  Eye Pupil - Bilateral - PERR Motion - Bilateral - EOMI.  Chest and Lung Exam Auscultation Breath sounds - clear at anterior chest wall and clear at posterior chest wall. Adventitious sounds - No Adventitious sounds.  Cardiovascular Auscultation Rhythm - Regular rate and rhythm. Heart Sounds - S1 WNL and S2 WNL. Murmurs & Other Heart Sounds - Auscultation of the heart reveals - Soft Ejection Murmur 2/6 over aortic point  Abdomen Palpation/Percussion Tenderness - Abdomen is non-tender to palpation. Abdomen is soft. Auscultation Auscultation of the abdomen reveals - Bowel sounds normal. Round abdomen.  Male Genitourinary Note: Not done, not pertinent to present illness  Musculoskeletal His LEFT knee shows minimal swelling. The swelling in his lower leg is also greatly improved. His range of motion is 0-124. There is no instability. His RIGHT knee shows no effusion. He has got a significant varus deformity on the RIGHT. Range of motion 5-120 with markedly crepitus on range of motion. He is tender medial greater than lateral with no instability.   Assessment / Plan 1. Osteoarthritis of right knee joint M17.11: Unilateral primary osteoarthritis, right knee  Patient Instructions Surgical Plans: Right Total Knee Replacement Disposition: Home, Straight to Outpatient PT at [facility] - 06/20/2017 PCP: Dr. Olen Chan IV TXA Anesthesia Issues:  Some Postop Nausea Patient was instructed on what medications to stop prior to surgery. - Follow up visit in 2 weeks with Dr. Wynelle Chan - Begin physical therapy following surgery - Pre-operative lab work as pre Pre-Surgical Testing - Prescriptions will be provided in hospital at time of discharge  Return to [place], MD for [place] at [facility] on 07/02/2017 at 02:30 PM  Encounter signed-off by Mickel Crow, PA-C

## 2017-05-30 NOTE — H&P (Signed)
Todd Chan, Todd Chan (72yo, [place]) DOB 09-19-1945   Chief Complaint Right Knee Pain H&P Right TKA for 06/17/2017  Patient's Pharmacies [id number]): [address], [place] [address], Ph [phone], Fax [phone]   Vitals Ht: 6 ft Wt: 280 lbs BMI: 38  BP: 134/82 sitting L arm  Pulse: 72 bpm regular   Allergies Reviewed Allergies NKDA  Medications Reviewed Medications Aleve 05/30/17   entered Murry Khiev, PA-C aspirin 81 mg chewable tablet Chew 1 tablet(s) every day by oral route. 05/30/17   entered [place] Kreitzer, PA-C Fish Oil 360 mg-1,200 mg capsule 05/30/17   entered Magic Mohler, PA-C lisinopril 10 mg tablet 1 tablet(s) every day. 05/30/17   entered [place], PA-C metoprolol tartrate 25 mg tablet 1 tablet(s) twice a day., start 04/06/2017 04/06/17   filled [place], PA-C Multiple Vitamins 05/30/17   entered Deklan Minar, PA-C pantoprazole 40 mg tablet,delayed release 1 tablet(s) every day., start 05/03/2017 05/03/17   filled Tacari Repass, PA-C raNITIdine 300 mg tablet 05/08/17   filled [name] Vitamin D3 2,000 unit tablet 1 tablet(s) every day. 05/30/17   entered Jillien Yakel, PA-C   Family History Reviewed Family History Mother - Arthritis (onset age: 48) (died age: 77)   - Mother deceased   - Malignant neoplasm of bone Father - Father deceased Sister - Family history of malignant neoplasm of breast  Social History Reviewed Social History Smoking Status: Former smoker Non-smoker Tobacco-years of use: 20 Chewing tobacco: none Alcohol intake: Moderate Hand Dominance: Left Work related injury?: N Advance directive: Y (Notes: Living Will) Medical Power of Attorney: Y  Surgical History Reviewed Surgical History Cardiac catheterization Arthroscopy Knee Joint Replacement - 10/08/2016 - Left Total Knee Colonoscopy - 05/03/2014  Past Medical History Reviewed Past  Medical History Hypertension: Y Notes: Childhood Measels   HPI Patient is scheduled for a right total knee arthroplasty on 06/17/17 by Dr. [name] at [facility]. The patient is a 72 year old male presenting several months out from his left total knee arthroplasty. The patient states that he is doing very well at this time. The pain is under excellent control at this time and describe their pain as 0 / 10 on a 10 point pain scale and some crunching with ROM. Overall he said that the LEFT knee is doing extremely well at this time. The RIGHT knee is becoming increasingly problematic and he wants to go ahead and get that replaced at this time. The RIGHT knee is bone-on-bone and he has failed injections in the past. He still exercises the LEFT knee. The swelling is much better at this time. He is very pleased with his results on the LEFT. He is now ready to proceed with the right side at this time. They have been treated conservatively in the past for the above stated problem and despite conservative measures, they continue to have progressive pain and severe functional limitations and dysfunction. They have failed non-operative management including home exercise, medications, and injections. It is felt that they would benefit from undergoing total joint replacement. Risks and benefits of the procedure have been discussed with the patient and they elect to proceed with surgery. There are no active contraindications to surgery such as ongoing infection or rapidly progressive neurological disease.   ROS Constitutional: Constitutional: no significant weight gain or loss and no fever.  HEENT: Eyes: no irritation, dry eyes, vision change, or sore throat.  Cardiovascular: Cardiovascular: no palpitations or chest pain; high blood pressure and some  shortness of breath.  Respiratory: Respiratory: shortness of breath.  Gastrointestinal: Gastrointestinal: no vomiting or diarrhea and not vomiting blood;  some belching.  Genitourinary: Genitourinary: no blood in urine or difficulty urinating.  Musculoskeletal: Musculoskeletal: Joint Pain; back pain.   Physical Exam Patient is a 72 year old male. General Mental Status - Alert, cooperative and good historian. General Appearance - pleasant, Not in acute distress. Orientation - Oriented X3. Build & Nutrition - Well nourished and Well developed.  Head and Neck Head - normocephalic, atraumatic . Neck Global Assessment - supple, no bruit auscultated on the right, no bruit auscultated on the left.  Eye Pupil - Bilateral - PERR Motion - Bilateral - EOMI.  Chest and Lung Exam Auscultation Breath sounds - clear at anterior chest wall and clear at posterior chest wall. Adventitious sounds - No Adventitious sounds.  Cardiovascular Auscultation Rhythm - Regular rate and rhythm. Heart Sounds - S1 WNL and S2 WNL. Murmurs & Other Heart Sounds - Auscultation of the heart reveals - Soft Ejection Murmur 2/6 over aortic point  Abdomen Palpation/Percussion Tenderness - Abdomen is non-tender to palpation. Abdomen is soft. Auscultation Auscultation of the abdomen reveals - Bowel sounds normal. Round abdomen.  Male Genitourinary Note: Not done, not pertinent to present illness  Musculoskeletal His LEFT knee shows minimal swelling. The swelling in his lower leg is also greatly improved. His range of motion is 0-124. There is no instability. His RIGHT knee shows no effusion. He has got a significant varus deformity on the RIGHT. Range of motion 5-120 with markedly crepitus on range of motion. He is tender medial greater than lateral with no instability.   Assessment / Plan 1. Osteoarthritis of right knee joint M17.11: Unilateral primary osteoarthritis, right knee  Patient Instructions Surgical Plans: Right Total Knee Replacement Disposition: Home, Straight to Outpatient PT at [facility] - 06/20/2017 PCP: Dr. Olen Pel IV TXA Anesthesia Issues:  Some Postop Nausea Patient was instructed on what medications to stop prior to surgery. - Follow up visit in 2 weeks with Dr. [name] - Begin physical therapy following surgery - Pre-operative lab work as pre Pre-Surgical Testing - Prescriptions will be provided in hospital at time of discharge  Return to [place], MD for [place] at [facility] on 07/02/2017 at 02:30 PM  Encounter signed-off by Mickel Crow, PA-C

## 2017-06-04 DIAGNOSIS — K219 Gastro-esophageal reflux disease without esophagitis: Secondary | ICD-10-CM | POA: Diagnosis not present

## 2017-06-04 DIAGNOSIS — M9902 Segmental and somatic dysfunction of thoracic region: Secondary | ICD-10-CM | POA: Diagnosis not present

## 2017-06-04 DIAGNOSIS — K228 Other specified diseases of esophagus: Secondary | ICD-10-CM | POA: Diagnosis not present

## 2017-06-04 DIAGNOSIS — K449 Diaphragmatic hernia without obstruction or gangrene: Secondary | ICD-10-CM | POA: Diagnosis not present

## 2017-06-04 DIAGNOSIS — M5134 Other intervertebral disc degeneration, thoracic region: Secondary | ICD-10-CM | POA: Diagnosis not present

## 2017-06-04 DIAGNOSIS — M9901 Segmental and somatic dysfunction of cervical region: Secondary | ICD-10-CM | POA: Diagnosis not present

## 2017-06-04 DIAGNOSIS — R0789 Other chest pain: Secondary | ICD-10-CM | POA: Diagnosis not present

## 2017-06-04 DIAGNOSIS — M5033 Other cervical disc degeneration, cervicothoracic region: Secondary | ICD-10-CM | POA: Diagnosis not present

## 2017-06-04 DIAGNOSIS — K2930 Chronic superficial gastritis without bleeding: Secondary | ICD-10-CM | POA: Diagnosis not present

## 2017-06-11 NOTE — Patient Instructions (Signed)
Todd Chan  06/11/2017   Your procedure is scheduled on: Monday 06/17/2017   Report to [facility] Main  Entrance   Report to admitting at  0800  AM   Call this number if you have problems the morning of surgery [phone]    Remember: Do not eat food or drink liquids :After Midnight.     Take these medicines the morning of surgery with A SIP OF WATER: Metoprolol (Lopressor), Pantoprazole (Protonix)                                You may not have any metal on your body including hair pins and              piercings  Do not wear jewelry, make-up, lotions, powders or perfumes, deodorant             Do not wear nail polish.  Do not shave  48 hours prior to surgery.              Men may shave face and neck.   Do not bring valuables to the hospital. [place].  Contacts, dentures or bridgework may not be worn into surgery.  Leave suitcase in the car. After surgery it may be brought to your room.                  Please read over the following fact sheets you were given: _____________________________________________________________________             [facility] - Preparing for Surgery Before surgery, you can play an important role.  Because skin is not sterile, your skin needs to be as free of germs as possible.  You can reduce the number of germs on your skin by washing with CHG (chlorahexidine gluconate) soap before surgery.  CHG is an antiseptic cleaner which kills germs and bonds with the skin to continue killing germs even after washing. Please DO NOT use if you have an allergy to CHG or antibacterial soaps.  If your skin becomes reddened/irritated stop using the CHG and inform your nurse when you arrive at Short Stay. Do not shave (including legs and underarms) for at least 48 hours prior to the first CHG shower.  You may shave your face/neck. Please follow these instructions carefully:  1.   Shower with CHG Soap the night before surgery and the  morning of Surgery.  2.  If you choose to wash your hair, wash your hair first as usual with your  normal  shampoo.  3.  After you shampoo, rinse your hair and body thoroughly to remove the  shampoo.                           4.  Use CHG as you would any other liquid soap.  You can apply chg directly  to the skin and wash                       Gently with a scrungie or clean washcloth.  5.  Apply the CHG Soap to your body ONLY FROM THE NECK DOWN.   Do not use on face/ open  Wound or open sores. Avoid contact with eyes, ears mouth and genitals (private parts).                       Wash face,  Genitals (private parts) with your normal soap.             6.  Wash thoroughly, paying special attention to the area where your surgery  will be performed.  7.  Thoroughly rinse your body with warm water from the neck down.  8.  DO NOT shower/wash with your normal soap after using and rinsing off  the CHG Soap.                9.  Pat yourself dry with a clean towel.            10.  Wear clean pajamas.            11.  Place clean sheets on your bed the night of your first shower and do not  sleep with pets. Day of Surgery : Do not apply any lotions/deodorants the morning of surgery.  Please wear clean clothes to the hospital/surgery center.  FAILURE TO FOLLOW THESE INSTRUCTIONS MAY RESULT IN THE CANCELLATION OF YOUR SURGERY PATIENT SIGNATURE_________________________________  NURSE SIGNATURE__________________________________  ________________________________________________________________________   [name]  An incentive spirometer is a tool that can help keep your lungs clear and active. This tool measures how well you are filling your lungs with each breath. Taking long deep breaths may help reverse or decrease the chance of developing breathing (pulmonary) problems (especially infection) following:  A long  period of time when you are unable to move or be active. BEFORE THE PROCEDURE   If the spirometer includes an indicator to show your best effort, your nurse or respiratory therapist will set it to a desired goal.  If possible, sit up straight or lean slightly forward. Try not to slouch.  Hold the incentive spirometer in an upright position. INSTRUCTIONS FOR USE  1. Sit on the edge of your bed if possible, or sit up as far as you can in bed or on a chair. 2. Hold the incentive spirometer in an upright position. 3. Breathe out normally. 4. Place the mouthpiece in your mouth and seal your lips tightly around it. 5. Breathe in slowly and as deeply as possible, raising the piston or the ball toward the top of the column. 6. Hold your breath for 3-5 seconds or for as long as possible. Allow the piston or ball to fall to the bottom of the column. 7. Remove the mouthpiece from your mouth and breathe out normally. 8. Rest for a few seconds and repeat Steps 1 through 7 at least 10 times every 1-2 hours when you are awake. Take your time and take a few normal breaths between deep breaths. 9. The spirometer may include an indicator to show your best effort. Use the indicator as a goal to work toward during each repetition. 10. After each set of 10 deep breaths, practice coughing to be sure your lungs are clear. If you have an incision (the cut made at the time of surgery), support your incision when coughing by placing a pillow or rolled up towels firmly against it. Once you are able to get out of bed, walk around indoors and cough well. You may stop using the incentive spirometer when instructed by your caregiver.  RISKS AND COMPLICATIONS  Take your time so you do not get  dizzy or light-headed.  If you are in pain, you may need to take or ask for pain medication before doing incentive spirometry. It is harder to take a deep breath if you are having pain. AFTER USE  Rest and breathe slowly and  easily.  It can be helpful to keep track of a log of your progress. Your caregiver can provide you with a simple table to help with this. If you are using the spirometer at home, follow these instructions: [place] IF:   You are having difficultly using the spirometer.  You have trouble using the spirometer as often as instructed.  Your pain medication is not giving enough relief while using the spirometer.  You develop fever of 100.5 F (38.1 C) or higher. SEEK IMMEDIATE MEDICAL CARE IF:   You cough up bloody sputum that had not been present before.  You develop fever of 102 F (38.9 C) or greater.  You develop worsening pain at or near the incision site. MAKE SURE YOU:   Understand these instructions.  Will watch your condition.  Will get help right away if you are not doing well or get worse. Document Released: 07/30/2006 Document Revised: 06/11/2011 Document Reviewed: 09/30/2006 ExitCare Patient Information 2014 ExitCare, [place].   ________________________________________________________________________  WHAT IS A BLOOD TRANSFUSION? Blood Transfusion Information  A transfusion is the replacement of blood or some of its parts. Blood is made up of multiple cells which provide different functions.  Red blood cells carry oxygen and are used for blood loss replacement.  White blood cells fight against infection.  Platelets control bleeding.  Plasma helps clot blood.  Other blood products are available for specialized needs, such as hemophilia or other clotting disorders. BEFORE THE TRANSFUSION  Who gives blood for transfusions?   Healthy volunteers who are fully evaluated to make sure their blood is safe. This is blood bank blood. Transfusion therapy is the safest it has ever been in the practice of medicine. Before blood is taken from a donor, a complete history is taken to make sure that person has no history of diseases nor engages in risky social  behavior (examples are intravenous drug use or sexual activity with multiple partners). The donor's travel history is screened to minimize risk of transmitting infections, such as malaria. The donated blood is tested for signs of infectious diseases, such as HIV and hepatitis. The blood is then tested to be sure it is compatible with you in order to minimize the chance of a transfusion reaction. If you or a relative donates blood, this is often done in anticipation of surgery and is not appropriate for emergency situations. It takes many days to process the donated blood. RISKS AND COMPLICATIONS Although transfusion therapy is very safe and saves many lives, the main dangers of transfusion include:   Getting an infectious disease.  Developing a transfusion reaction. This is an allergic reaction to something in the blood you were given. Every precaution is taken to prevent this. The decision to have a blood transfusion has been considered carefully by your caregiver before blood is given. Blood is not given unless the benefits outweigh the risks. AFTER THE TRANSFUSION  Right after receiving a blood transfusion, you will usually feel much better and more energetic. This is especially true if your red blood cells have gotten low (anemic). The transfusion raises the level of the red blood cells which carry oxygen, and this usually causes an energy increase.  The nurse administering the transfusion will  monitor you carefully for complications. HOME CARE INSTRUCTIONS  No special instructions are needed after a transfusion. You may find your energy is better. Speak with your caregiver about any limitations on activity for underlying diseases you may have. SEEK MEDICAL CARE IF:   Your condition is not improving after your transfusion.  You develop redness or irritation at the intravenous (IV) site. SEEK IMMEDIATE MEDICAL CARE IF:  Any of the following symptoms occur over the next 12 hours:  Shaking  chills.  You have a temperature by mouth above 102 F (38.9 C), not controlled by medicine.  Chest, back, or muscle pain.  People around you feel you are not acting correctly or are confused.  Shortness of breath or difficulty breathing.  Dizziness and fainting.  You get a rash or develop hives.  You have a decrease in urine output.  Your urine turns a dark color or changes to pink, red, or brown. Any of the following symptoms occur over the next 10 days:  You have a temperature by mouth above 102 F (38.9 C), not controlled by medicine.  Shortness of breath.  Weakness after normal activity.  The white part of the eye turns yellow (jaundice).  You have a decrease in the amount of urine or are urinating less often.  Your urine turns a dark color or changes to pink, red, or brown. Document Released: 03/16/2000 Document Revised: 06/11/2011 Document Reviewed: 11/03/2007 [facility] Patient Information 2014 [place], [place].  _______________________________________________________________________

## 2017-06-11 NOTE — Progress Notes (Signed)
04/11/2017- noted in Epic- EKG  04/25/2017- noted in Epic-office note from Dr. Bettina Gavia  04/09/2017-noted in Epic- stress test  04/17/2017- noted in [place]- Left heart cath and coronary angiography  03/08/2017- noted in Epic- CXR and Ct angio chest PE w/or wo contrast

## 2017-06-12 DIAGNOSIS — K219 Gastro-esophageal reflux disease without esophagitis: Secondary | ICD-10-CM | POA: Diagnosis not present

## 2017-06-12 DIAGNOSIS — K2930 Chronic superficial gastritis without bleeding: Secondary | ICD-10-CM | POA: Diagnosis not present

## 2017-06-13 ENCOUNTER — Inpatient Hospital Stay (HOSPITAL_COMMUNITY): Admission: RE | Admit: 2017-06-13 | Payer: Medicare Other | Source: Ambulatory Visit

## 2017-06-13 ENCOUNTER — Encounter (HOSPITAL_COMMUNITY): Payer: Self-pay

## 2017-06-13 ENCOUNTER — Encounter (HOSPITAL_COMMUNITY)
Admission: RE | Admit: 2017-06-13 | Discharge: 2017-06-13 | Disposition: A | Discharge status: Home / Self Care | Payer: Medicare Other | Source: Ambulatory Visit | Attending: Orthopedic Surgery | Admitting: Orthopedic Surgery

## 2017-06-13 ENCOUNTER — Other Ambulatory Visit: Payer: Self-pay

## 2017-06-13 DIAGNOSIS — Z01812 Encounter for preprocedural laboratory examination: Secondary | ICD-10-CM | POA: Diagnosis not present

## 2017-06-13 LAB — COMPREHENSIVE METABOLIC PANEL
ALT: 18 U/L (ref 17–63)
ANION GAP: 9 (ref 5–15)
AST: 23 U/L (ref 15–41)
Albumin: 4 g/dL (ref 3.5–5.0)
Alkaline Phosphatase: 36 U/L — ABNORMAL LOW (ref 38–126)
BUN: 12 mg/dL (ref 6–20)
CO2: 28 mmol/L (ref 22–32)
CREATININE: 0.89 mg/dL (ref 0.61–1.24)
Calcium: 9.2 mg/dL (ref 8.9–10.3)
Chloride: 105 mmol/L (ref 101–111)
Glucose, Bld: 94 mg/dL (ref 65–99)
Potassium: 4.9 mmol/L (ref 3.5–5.1)
SODIUM: 142 mmol/L (ref 135–145)
Total Bilirubin: 1.3 mg/dL — ABNORMAL HIGH (ref 0.3–1.2)
Total Protein: 7.2 g/dL (ref 6.5–8.1)

## 2017-06-13 LAB — SURGICAL PCR SCREEN
MRSA, PCR: NEGATIVE
STAPHYLOCOCCUS AUREUS: NEGATIVE

## 2017-06-13 LAB — CBC
HCT: 44.1 % (ref 39.0–52.0)
Hemoglobin: 15.3 g/dL (ref 13.0–17.0)
MCH: 34.2 pg — AB (ref 26.0–34.0)
MCHC: 34.7 g/dL (ref 30.0–36.0)
MCV: 98.7 fL (ref 78.0–100.0)
PLATELETS: 188 10*3/uL (ref 150–400)
RBC: 4.47 MIL/uL (ref 4.22–5.81)
RDW: 12.9 % (ref 11.5–15.5)
WBC: 6.7 10*3/uL (ref 4.0–10.5)

## 2017-06-13 LAB — APTT: aPTT: 29 seconds (ref 24–36)

## 2017-06-13 LAB — PROTIME-INR
INR: 1.02
PROTHROMBIN TIME: 13.3 s (ref 11.4–15.2)

## 2017-06-13 NOTE — Progress Notes (Signed)
   06/13/17 1020  OBSTRUCTIVE SLEEP APNEA  Have you ever been diagnosed with sleep apnea through a sleep study? No  Do you snore loudly (loud enough to be heard through closed doors)?  1  Do you often feel tired, fatigued, or sleepy during the daytime (such as falling asleep during driving or talking to someone)? 1  Has anyone observed you stop breathing during your sleep? 0  Do you have, or are you being treated for high blood pressure? 1  BMI more than 35 kg/m2? 1  Age > 50 (1-yes) 1  Neck circumference greater than:Male 16 inches or larger, Male 17inches or larger? 1  Male Gender (Yes=1) 1  Obstructive Sleep Apnea Score 7  Score 5 or greater  Results sent to PCP

## 2017-06-13 NOTE — Progress Notes (Signed)
Patient states at PAT appointment that he had tooth pulled yesterday 06/12/2017 and Dr. [name] aware. Patient is on antibiotics and today temperature at PAT appointment 99.3. Patient instructed to check his temperature frequently and if it goes above 100 to let Dr. [name] know. Patient verbalized understanding.

## 2017-06-14 DIAGNOSIS — M9901 Segmental and somatic dysfunction of cervical region: Secondary | ICD-10-CM | POA: Diagnosis not present

## 2017-06-14 DIAGNOSIS — M5134 Other intervertebral disc degeneration, thoracic region: Secondary | ICD-10-CM | POA: Diagnosis not present

## 2017-06-14 DIAGNOSIS — M5033 Other cervical disc degeneration, cervicothoracic region: Secondary | ICD-10-CM | POA: Diagnosis not present

## 2017-06-14 DIAGNOSIS — M9902 Segmental and somatic dysfunction of thoracic region: Secondary | ICD-10-CM | POA: Diagnosis not present

## 2017-06-16 MED ORDER — BUPIVACAINE LIPOSOME 1.3 % IJ SUSP
20.0000 mL | Freq: Once | INTRAMUSCULAR | Status: DC
  Filled 2017-06-16: qty 20

## 2017-06-16 MED ORDER — TRANEXAMIC ACID 1000 MG/10ML IV SOLN
1000.0000 mg | INTRAVENOUS | Status: AC
  Administered 2017-06-17: 1000 mg via INTRAVENOUS
  Filled 2017-06-16: qty 1100

## 2017-06-16 MED ORDER — DEXTROSE 5 % IV SOLN
3.0000 g | INTRAVENOUS | Status: AC
  Administered 2017-06-17: 3 g via INTRAVENOUS
  Filled 2017-06-16: qty 3

## 2017-06-17 ENCOUNTER — Inpatient Hospital Stay (HOSPITAL_COMMUNITY)
Admission: RE | Admit: 2017-06-17 | Discharge: 2017-06-19 | DRG: 470 | Disposition: A | Discharge status: Home / Self Care | Payer: Medicare Other | Source: Ambulatory Visit | Attending: Orthopedic Surgery | Admitting: Orthopedic Surgery

## 2017-06-17 ENCOUNTER — Inpatient Hospital Stay (HOSPITAL_COMMUNITY): Payer: Medicare Other | Admitting: Registered Nurse

## 2017-06-17 ENCOUNTER — Encounter (HOSPITAL_COMMUNITY)
Admission: RE | Disposition: A | Discharge status: Home / Self Care | Payer: Self-pay | Source: Ambulatory Visit | Attending: Orthopedic Surgery

## 2017-06-17 ENCOUNTER — Other Ambulatory Visit: Payer: Self-pay

## 2017-06-17 ENCOUNTER — Encounter (HOSPITAL_COMMUNITY): Payer: Self-pay

## 2017-06-17 DIAGNOSIS — Z7982 Long term (current) use of aspirin: Secondary | ICD-10-CM | POA: Diagnosis not present

## 2017-06-17 DIAGNOSIS — Z87891 Personal history of nicotine dependence: Secondary | ICD-10-CM | POA: Diagnosis not present

## 2017-06-17 DIAGNOSIS — I739 Peripheral vascular disease, unspecified: Secondary | ICD-10-CM | POA: Diagnosis present

## 2017-06-17 DIAGNOSIS — I10 Essential (primary) hypertension: Secondary | ICD-10-CM | POA: Diagnosis present

## 2017-06-17 DIAGNOSIS — G8918 Other acute postprocedural pain: Secondary | ICD-10-CM | POA: Diagnosis not present

## 2017-06-17 DIAGNOSIS — K219 Gastro-esophageal reflux disease without esophagitis: Secondary | ICD-10-CM | POA: Diagnosis present

## 2017-06-17 DIAGNOSIS — Z8261 Family history of arthritis: Secondary | ICD-10-CM

## 2017-06-17 DIAGNOSIS — M179 Osteoarthritis of knee, unspecified: Secondary | ICD-10-CM | POA: Diagnosis present

## 2017-06-17 DIAGNOSIS — M797 Fibromyalgia: Secondary | ICD-10-CM | POA: Diagnosis present

## 2017-06-17 DIAGNOSIS — M1710 Unilateral primary osteoarthritis, unspecified knee: Secondary | ICD-10-CM | POA: Diagnosis present

## 2017-06-17 DIAGNOSIS — M1711 Unilateral primary osteoarthritis, right knee: Principal | ICD-10-CM | POA: Diagnosis present

## 2017-06-17 DIAGNOSIS — Z803 Family history of malignant neoplasm of breast: Secondary | ICD-10-CM | POA: Diagnosis not present

## 2017-06-17 DIAGNOSIS — M25561 Pain in right knee: Secondary | ICD-10-CM | POA: Diagnosis not present

## 2017-06-17 HISTORY — PX: TOTAL KNEE ARTHROPLASTY: SHX125

## 2017-06-17 LAB — TYPE AND SCREEN
ABO/RH(D): A NEG
Antibody Screen: NEGATIVE

## 2017-06-17 SURGERY — ARTHROPLASTY, KNEE, TOTAL
Anesthesia: General | Site: Knee | Laterality: Right

## 2017-06-17 MED ORDER — ONDANSETRON HCL 4 MG PO TABS: 4.0000 mg | ORAL_TABLET | Freq: Four times a day (QID) | ORAL | Status: DC | PRN

## 2017-06-17 MED ORDER — PHENYLEPHRINE 40 MCG/ML (10ML) SYRINGE FOR IV PUSH (FOR BLOOD PRESSURE SUPPORT)
PREFILLED_SYRINGE | INTRAVENOUS | Status: AC
  Filled 2017-06-17: qty 10

## 2017-06-17 MED ORDER — SODIUM CHLORIDE 0.9 % IJ SOLN
INTRAMUSCULAR | Status: DC | PRN
  Administered 2017-06-17: 60 mL

## 2017-06-17 MED ORDER — CEFAZOLIN SODIUM-DEXTROSE 2-4 GM/100ML-% IV SOLN
2.0000 g | Freq: Four times a day (QID) | INTRAVENOUS | Status: AC
  Administered 2017-06-17 (×2): 2 g via INTRAVENOUS
  Filled 2017-06-17 (×2): qty 100

## 2017-06-17 MED ORDER — FENTANYL CITRATE (PF) 100 MCG/2ML IJ SOLN
50.0000 ug | INTRAMUSCULAR | Status: DC
  Administered 2017-06-17: 100 ug via INTRAVENOUS
  Filled 2017-06-17: qty 2

## 2017-06-17 MED ORDER — LIDOCAINE 2% (20 MG/ML) 5 ML SYRINGE
INTRAMUSCULAR | Status: DC | PRN
  Administered 2017-06-17: 40 mg via INTRAVENOUS

## 2017-06-17 MED ORDER — OXYCODONE HCL 5 MG PO TABS
10.0000 mg | ORAL_TABLET | ORAL | Status: DC | PRN
  Administered 2017-06-18: 5 mg via ORAL

## 2017-06-17 MED ORDER — PHENYLEPHRINE 40 MCG/ML (10ML) SYRINGE FOR IV PUSH (FOR BLOOD PRESSURE SUPPORT)
PREFILLED_SYRINGE | INTRAVENOUS | Status: DC | PRN
  Administered 2017-06-17: 80 ug via INTRAVENOUS
  Administered 2017-06-17: 160 ug via INTRAVENOUS
  Administered 2017-06-17 (×2): 80 ug via INTRAVENOUS
  Administered 2017-06-17: 120 ug via INTRAVENOUS
  Administered 2017-06-17: 80 ug via INTRAVENOUS

## 2017-06-17 MED ORDER — BUPIVACAINE LIPOSOME 1.3 % IJ SUSP
INTRAMUSCULAR | Status: DC | PRN
  Administered 2017-06-17: 20 mL

## 2017-06-17 MED ORDER — METOCLOPRAMIDE HCL 5 MG PO TABS: 5.0000 mg | ORAL_TABLET | Freq: Three times a day (TID) | ORAL | Status: DC | PRN

## 2017-06-17 MED ORDER — DIPHENHYDRAMINE HCL 12.5 MG/5ML PO ELIX: 12.5000 mg | ORAL_SOLUTION | ORAL | Status: DC | PRN

## 2017-06-17 MED ORDER — TRANEXAMIC ACID 1000 MG/10ML IV SOLN
1000.0000 mg | Freq: Once | INTRAVENOUS | Status: AC
  Administered 2017-06-17: 15:00:00 1000 mg via INTRAVENOUS
  Filled 2017-06-17: qty 10

## 2017-06-17 MED ORDER — ACETAMINOPHEN 10 MG/ML IV SOLN
1000.0000 mg | Freq: Once | INTRAVENOUS | Status: AC
  Administered 2017-06-17: 1000 mg via INTRAVENOUS
  Filled 2017-06-17: qty 100

## 2017-06-17 MED ORDER — PHENOL 1.4 % MT LIQD: 1.0000 | OROMUCOSAL | Status: DC | PRN

## 2017-06-17 MED ORDER — FLEET ENEMA 7-19 GM/118ML RE ENEM: 1.0000 | ENEMA | Freq: Once | RECTAL | Status: DC | PRN

## 2017-06-17 MED ORDER — GABAPENTIN 300 MG PO CAPS
300.0000 mg | ORAL_CAPSULE | Freq: Once | ORAL | Status: AC
  Administered 2017-06-17: 300 mg via ORAL
  Filled 2017-06-17: qty 1

## 2017-06-17 MED ORDER — DEXAMETHASONE SODIUM PHOSPHATE 10 MG/ML IJ SOLN
INTRAMUSCULAR | Status: AC
  Filled 2017-06-17: qty 1

## 2017-06-17 MED ORDER — ONDANSETRON HCL 4 MG/2ML IJ SOLN
INTRAMUSCULAR | Status: AC
  Filled 2017-06-17: qty 2

## 2017-06-17 MED ORDER — METHOCARBAMOL 500 MG PO TABS
500.0000 mg | ORAL_TABLET | Freq: Four times a day (QID) | ORAL | Status: DC | PRN
  Administered 2017-06-17 – 2017-06-18 (×2): 500 mg via ORAL
  Filled 2017-06-17 (×2): qty 1

## 2017-06-17 MED ORDER — SODIUM CHLORIDE 0.9 % IV SOLN
INTRAVENOUS | Status: DC
  Administered 2017-06-17 – 2017-06-18 (×2): via INTRAVENOUS

## 2017-06-17 MED ORDER — BUPIVACAINE IN DEXTROSE 0.75-8.25 % IT SOLN
INTRATHECAL | Status: DC | PRN
  Administered 2017-06-17: 2 mL via INTRATHECAL

## 2017-06-17 MED ORDER — LIDOCAINE 2% (20 MG/ML) 5 ML SYRINGE
INTRAMUSCULAR | Status: AC
  Filled 2017-06-17: qty 5

## 2017-06-17 MED ORDER — BISACODYL 10 MG RE SUPP: 10.0000 mg | Freq: Every day | RECTAL | Status: DC | PRN

## 2017-06-17 MED ORDER — PANTOPRAZOLE SODIUM 40 MG PO TBEC
40.0000 mg | DELAYED_RELEASE_TABLET | Freq: Every day | ORAL | Status: DC
  Administered 2017-06-18 – 2017-06-19 (×2): 40 mg via ORAL
  Filled 2017-06-17 (×2): qty 1

## 2017-06-17 MED ORDER — SODIUM CHLORIDE 0.9 % IJ SOLN
INTRAMUSCULAR | Status: AC
  Filled 2017-06-17: qty 10

## 2017-06-17 MED ORDER — MIDAZOLAM HCL 2 MG/2ML IJ SOLN
1.0000 mg | INTRAMUSCULAR | Status: DC
  Administered 2017-06-17: 2 mg via INTRAVENOUS
  Filled 2017-06-17: qty 2

## 2017-06-17 MED ORDER — POLYETHYLENE GLYCOL 3350 17 G PO PACK: 17.0000 g | PACK | Freq: Every day | ORAL | Status: DC | PRN

## 2017-06-17 MED ORDER — ONDANSETRON HCL 4 MG/2ML IJ SOLN
INTRAMUSCULAR | Status: DC | PRN
  Administered 2017-06-17: 4 mg via INTRAVENOUS

## 2017-06-17 MED ORDER — PROPOFOL 10 MG/ML IV BOLUS
INTRAVENOUS | Status: AC
  Filled 2017-06-17: qty 20

## 2017-06-17 MED ORDER — DEXAMETHASONE SODIUM PHOSPHATE 10 MG/ML IJ SOLN
10.0000 mg | Freq: Once | INTRAMUSCULAR | Status: AC
  Administered 2017-06-18: 09:00:00 10 mg via INTRAVENOUS
  Filled 2017-06-17: qty 1

## 2017-06-17 MED ORDER — ONDANSETRON HCL 4 MG/2ML IJ SOLN: 4.0000 mg | Freq: Four times a day (QID) | INTRAMUSCULAR | Status: DC | PRN

## 2017-06-17 MED ORDER — DEXAMETHASONE SODIUM PHOSPHATE 10 MG/ML IJ SOLN
10.0000 mg | Freq: Once | INTRAMUSCULAR | Status: AC
  Administered 2017-06-17: 10 mg via INTRAVENOUS

## 2017-06-17 MED ORDER — METHOCARBAMOL 1000 MG/10ML IJ SOLN
500.0000 mg | Freq: Four times a day (QID) | INTRAVENOUS | Status: DC | PRN
  Filled 2017-06-17: qty 5

## 2017-06-17 MED ORDER — SODIUM CHLORIDE 0.9 % IR SOLN
Status: DC | PRN
  Administered 2017-06-17: 1000 mL

## 2017-06-17 MED ORDER — MENTHOL 3 MG MT LOZG: 1.0000 | LOZENGE | OROMUCOSAL | Status: DC | PRN

## 2017-06-17 MED ORDER — OXYCODONE HCL 5 MG PO TABS
5.0000 mg | ORAL_TABLET | ORAL | Status: DC | PRN
  Administered 2017-06-17 – 2017-06-19 (×7): 5 mg via ORAL
  Filled 2017-06-17 (×8): qty 1

## 2017-06-17 MED ORDER — PROPOFOL 10 MG/ML IV BOLUS
INTRAVENOUS | Status: AC
  Filled 2017-06-17: qty 60

## 2017-06-17 MED ORDER — LACTATED RINGERS IV SOLN
INTRAVENOUS | Status: DC
  Administered 2017-06-17 (×3): via INTRAVENOUS

## 2017-06-17 MED ORDER — METOPROLOL TARTRATE 25 MG PO TABS
25.0000 mg | ORAL_TABLET | Freq: Two times a day (BID) | ORAL | Status: DC
  Administered 2017-06-17 – 2017-06-19 (×4): 25 mg via ORAL
  Filled 2017-06-17 (×5): qty 1

## 2017-06-17 MED ORDER — HYDROMORPHONE HCL 1 MG/ML IJ SOLN: 0.2500 mg | INTRAMUSCULAR | Status: DC | PRN

## 2017-06-17 MED ORDER — SODIUM CHLORIDE 0.9 % IJ SOLN
INTRAMUSCULAR | Status: AC
  Filled 2017-06-17: qty 50

## 2017-06-17 MED ORDER — PROPOFOL 10 MG/ML IV BOLUS
INTRAVENOUS | Status: DC | PRN
  Administered 2017-06-17: 20 mg via INTRAVENOUS

## 2017-06-17 MED ORDER — HYDROMORPHONE HCL 1 MG/ML IJ SOLN: 0.5000 mg | INTRAMUSCULAR | Status: DC | PRN

## 2017-06-17 MED ORDER — TRAMADOL HCL 50 MG PO TABS: 50.0000 mg | ORAL_TABLET | Freq: Four times a day (QID) | ORAL | Status: DC | PRN

## 2017-06-17 MED ORDER — ACETAMINOPHEN 500 MG PO TABS
1000.0000 mg | ORAL_TABLET | Freq: Four times a day (QID) | ORAL | Status: AC
  Administered 2017-06-17 – 2017-06-18 (×4): 1000 mg via ORAL
  Filled 2017-06-17 (×4): qty 2

## 2017-06-17 MED ORDER — PROPOFOL 500 MG/50ML IV EMUL
INTRAVENOUS | Status: DC | PRN
  Administered 2017-06-17: 75 ug/kg/min via INTRAVENOUS

## 2017-06-17 MED ORDER — METOCLOPRAMIDE HCL 5 MG/ML IJ SOLN: 5.0000 mg | Freq: Three times a day (TID) | INTRAMUSCULAR | Status: DC | PRN

## 2017-06-17 MED ORDER — RIVAROXABAN 10 MG PO TABS
10.0000 mg | ORAL_TABLET | Freq: Every day | ORAL | Status: DC
  Administered 2017-06-18 – 2017-06-19 (×2): 10 mg via ORAL
  Filled 2017-06-17 (×2): qty 1

## 2017-06-17 MED ORDER — CHLORHEXIDINE GLUCONATE 4 % EX LIQD: 60.0000 mL | Freq: Once | CUTANEOUS | Status: DC

## 2017-06-17 MED ORDER — DOCUSATE SODIUM 100 MG PO CAPS
100.0000 mg | ORAL_CAPSULE | Freq: Two times a day (BID) | ORAL | Status: DC
  Administered 2017-06-17 – 2017-06-19 (×4): 100 mg via ORAL
  Filled 2017-06-17 (×4): qty 1

## 2017-06-17 SURGICAL SUPPLY — 50 items
BAG DECANTER FOR FLEXI CONT (MISCELLANEOUS) IMPLANT
BAG ZIPLOCK 12X15 (MISCELLANEOUS) IMPLANT
BANDAGE ACE 6X5 VEL STRL LF (GAUZE/BANDAGES/DRESSINGS) ×3 IMPLANT
BLADE SAG 18X100X1.27 (BLADE) ×3 IMPLANT
BLADE SAW SGTL 11.0X1.19X90.0M (BLADE) ×3 IMPLANT
BOWL SMART MIX CTS (DISPOSABLE) ×3 IMPLANT
CAPT KNEE TOTAL 3 ATTUNE ×3 IMPLANT
CEMENT HV SMART SET (Cement) ×6 IMPLANT
CLOSURE WOUND 1/2 X4 (GAUZE/BANDAGES/DRESSINGS) ×2
COVER SURGICAL LIGHT HANDLE (MISCELLANEOUS) ×3 IMPLANT
CUFF TOURN SGL QUICK 34 (TOURNIQUET CUFF) ×2
CUFF TRNQT CYL 34X4X40X1 (TOURNIQUET CUFF) ×1 IMPLANT
DECANTER SPIKE VIAL GLASS SM (MISCELLANEOUS) ×3 IMPLANT
DRAPE TOP 10253 STERILE (DRAPES) IMPLANT
DRAPE U-SHAPE 47X51 STRL (DRAPES) ×3 IMPLANT
DRSG ADAPTIC 3X8 NADH LF (GAUZE/BANDAGES/DRESSINGS) ×3 IMPLANT
DRSG PAD ABDOMINAL 8X10 ST (GAUZE/BANDAGES/DRESSINGS) ×3 IMPLANT
DURAPREP 26ML APPLICATOR (WOUND CARE) ×3 IMPLANT
ELECT REM PT RETURN 15FT ADLT (MISCELLANEOUS) ×3 IMPLANT
EVACUATOR 1/8 PVC DRAIN (DRAIN) ×3 IMPLANT
GAUZE SPONGE 4X4 12PLY STRL (GAUZE/BANDAGES/DRESSINGS) ×3 IMPLANT
GLOVE BIO SURGEON STRL SZ7.5 (GLOVE) IMPLANT
GLOVE BIO SURGEON STRL SZ8 (GLOVE) ×3 IMPLANT
GLOVE BIOGEL PI IND STRL 6.5 (GLOVE) IMPLANT
GLOVE BIOGEL PI IND STRL 8 (GLOVE) ×1 IMPLANT
GLOVE BIOGEL PI INDICATOR 6.5 (GLOVE)
GLOVE BIOGEL PI INDICATOR 8 (GLOVE) ×2
GLOVE SURG SS PI 6.5 STRL IVOR (GLOVE) IMPLANT
GOWN STRL REUS W/TWL LRG LVL3 (GOWN DISPOSABLE) ×3 IMPLANT
GOWN STRL REUS W/TWL XL LVL3 (GOWN DISPOSABLE) IMPLANT
HANDPIECE INTERPULSE COAX TIP (DISPOSABLE) ×2
IMMOBILIZER KNEE 20 (SOFTGOODS) ×3
IMMOBILIZER KNEE 20 THIGH 36 (SOFTGOODS) ×1 IMPLANT
MANIFOLD NEPTUNE II (INSTRUMENTS) ×3 IMPLANT
NS IRRIG 1000ML POUR BTL (IV SOLUTION) ×3 IMPLANT
PACK TOTAL KNEE CUSTOM (KITS) ×3 IMPLANT
PADDING CAST COTTON 6X4 STRL (CAST SUPPLIES) ×3 IMPLANT
POSITIONER SURGICAL ARM (MISCELLANEOUS) ×3 IMPLANT
SET HNDPC FAN SPRY TIP SCT (DISPOSABLE) ×1 IMPLANT
STRIP CLOSURE SKIN 1/2X4 (GAUZE/BANDAGES/DRESSINGS) ×4 IMPLANT
SUT MNCRL AB 4-0 PS2 18 (SUTURE) ×3 IMPLANT
SUT STRATAFIX 0 PDS 27 VIOLET (SUTURE) ×3
SUT VIC AB 2-0 CT1 27 (SUTURE) ×6
SUT VIC AB 2-0 CT1 TAPERPNT 27 (SUTURE) ×3 IMPLANT
SUTURE STRATFX 0 PDS 27 VIOLET (SUTURE) ×1 IMPLANT
SYR 30ML LL (SYRINGE) ×6 IMPLANT
TRAY FOLEY W/METER SILVER 16FR (SET/KITS/TRAYS/PACK) ×3 IMPLANT
WATER STERILE IRR 1000ML POUR (IV SOLUTION) ×6 IMPLANT
WRAP KNEE MAXI GEL POST OP (GAUZE/BANDAGES/DRESSINGS) ×3 IMPLANT
YANKAUER SUCT BULB TIP 10FT TU (MISCELLANEOUS) ×3 IMPLANT

## 2017-06-17 NOTE — Anesthesia Procedure Notes (Signed)
Anesthesia Regional Block: Adductor canal block   Pre-Anesthetic Checklist: ,, timeout performed, Correct Patient, Correct Site, Correct Laterality, Correct Procedure, Correct Position, site marked, Risks and benefits discussed,  Surgical consent,  Pre-op evaluation,  At surgeon's request and post-op pain management  Laterality: Right  Prep: chloraprep       Needles:  Injection technique: Single-shot  Needle Type: Stimulator Needle - 80          Additional Needles:   Procedures: Doppler guided,,,, ultrasound used (permanent image in chart),,,,  Narrative:  Start time: 06/17/2017 9:30 AM End time: 06/17/2017 9:45 AM Injection made incrementally with aspirations every 5 mL.  Performed by: Personally  Anesthesiologist: Belinda Block, MD

## 2017-06-17 NOTE — Discharge Instructions (Addendum)
° °Dr. Frank [name] °Total Joint Specialist °[facility] °[address] °[place], [place] [address] °[phone] ° °TOTAL KNEE REPLACEMENT POSTOPERATIVE DIRECTIONS ° °Knee Rehabilitation, Guidelines Following Surgery  °Results after knee surgery are often greatly improved when you follow the exercise, range of motion and muscle strengthening exercises prescribed by your doctor. Safety measures are also important to protect the knee from further injury. Any time any of these exercises cause you to have increased pain or swelling in your knee joint, decrease the amount until you are comfortable again and slowly increase them. If you have problems or questions, call your caregiver or physical therapist for advice.  ° °HOME CARE INSTRUCTIONS  °Remove items at home which could result in a fall. This includes throw rugs or furniture in walking pathways.  °· ICE to the affected knee every three hours for 30 minutes at a time and then as needed for pain and swelling.  Continue to use ice on the knee for pain and swelling from surgery. You may notice swelling that will progress down to the foot and ankle.  This is normal after surgery.  Elevate the leg when you are not up walking on it.   °· Continue to use the breathing machine which will help keep your temperature down.  It is common for your temperature to cycle up and down following surgery, especially at night when you are not up moving around and exerting yourself.  The breathing machine keeps your lungs expanded and your temperature down. °· Do not place pillow under knee, focus on keeping the knee straight while resting ° °DIET °You may resume your previous home diet once your are discharged from the hospital. ° °DRESSING / WOUND CARE / SHOWERING °You may shower 3 days after surgery, but keep the wounds dry during showering.  You may use an occlusive plastic wrap (Press'n Seal for example), NO SOAKING/SUBMERGING IN THE BATHTUB.  If the bandage gets  wet, change with a clean dry gauze.  If the incision gets wet, pat the wound dry with a clean towel. °You may start showering once you are discharged home but do not submerge the incision under water. Just pat the incision dry and apply a dry gauze dressing on daily. °Change the surgical dressing daily and reapply a dry dressing each time. ° °ACTIVITY °Walk with your walker as instructed. °Use walker as long as suggested by your caregivers. °Avoid periods of inactivity such as sitting longer than an hour when not asleep. This helps prevent blood clots.  °You may resume a sexual relationship in one month or when given the OK by your doctor.  °You may return to work once you are cleared by your doctor.  °Do not drive a car for 6 weeks or until released by you surgeon.  °Do not drive while taking narcotics. ° °WEIGHT BEARING °Weight bearing as tolerated with assist device (walker, cane, etc) as directed, use it as long as suggested by your surgeon or therapist, typically at least 4-6 weeks. ° °POSTOPERATIVE CONSTIPATION PROTOCOL °Constipation - defined medically as fewer than three stools per week and severe constipation as less than one stool per week. ° °One of the most common issues patients have following surgery is constipation.  Even if you have a regular bowel pattern at home, your normal regimen is likely to be disrupted due to multiple reasons following surgery.  Combination of anesthesia, postoperative narcotics, change in appetite and fluid intake all can affect your bowels.    In order to avoid complications following surgery, here are some recommendations in order to help you during your recovery period. ° °Colace (docusate) - Pick up an over-the-counter form of Colace or another stool softener and take twice a day as long as you are requiring postoperative pain medications.  Take with a full glass of water daily.  If you experience loose stools or diarrhea, hold the colace until you stool forms back up.  If  your symptoms do not get better within 1 week or if they get worse, check with your doctor. ° °Dulcolax (bisacodyl) - Pick up over-the-counter and take as directed by the product packaging as needed to assist with the movement of your bowels.  Take with a full glass of water.  Use this product as needed if not relieved by Colace only.  ° °MiraLax (polyethylene glycol) - Pick up over-the-counter to have on hand.  MiraLax is a solution that will increase the amount of water in your bowels to assist with bowel movements.  Take as directed and can mix with a glass of water, juice, soda, coffee, or tea.  Take if you go more than two days without a movement. °Do not use MiraLax more than once per day. Call your doctor if you are still constipated or irregular after using this medication for 7 days in a row. ° °If you continue to have problems with postoperative constipation, please contact the office for further assistance and recommendations.  If you experience "the worst abdominal pain ever" or develop nausea or vomiting, please contact the office immediatly for further recommendations for treatment. ° °ITCHING ° If you experience itching with your medications, try taking only a single pain pill, or even half a pain pill at a time.  You can also use Benadryl over the counter for itching or also to help with sleep.  ° °TED HOSE STOCKINGS °Wear the elastic stockings on both legs for three weeks following surgery during the day but you may remove then at night for sleeping. ° °MEDICATIONS °See your medication summary on the “After Visit Summary” that the nursing staff will review with you prior to discharge.  You may have some home medications which will be placed on hold until you complete the course of blood thinner medication.  It is important for you to complete the blood thinner medication as prescribed by your surgeon.  Continue your approved medications as instructed at time of discharge. ° °PRECAUTIONS °If you  experience chest pain or shortness of breath - call 911 immediately for transfer to the hospital emergency department.  °If you develop a fever greater that 101 F, purulent drainage from wound, increased redness or drainage from wound, foul odor from the wound/dressing, or calf pain - CONTACT YOUR SURGEON.   °                                                °FOLLOW-UP APPOINTMENTS °Make sure you keep all of your appointments after your operation with your surgeon and caregivers. You should call the office at the above phone number and make an appointment for approximately two weeks after the date of your surgery or on the date instructed by your surgeon outlined in the "After Visit Summary". ° ° °RANGE OF MOTION AND STRENGTHENING EXERCISES  °Rehabilitation of the knee is important following a knee injury or   an operation. After just a few days of immobilization, the muscles of the thigh which control the knee become weakened and shrink (atrophy). Knee exercises are designed to build up the tone and strength of the thigh muscles and to improve knee motion. Often times heat used for twenty to thirty minutes before working out will loosen up your tissues and help with improving the range of motion but do not use heat for the first two weeks following surgery. These exercises can be done on a training (exercise) mat, on the floor, on a table or on a bed. Use what ever works the best and is most comfortable for you Knee exercises include:  °Leg Lifts - While your knee is still immobilized in a splint or cast, you can do straight leg raises. Lift the leg to 60 degrees, hold for 3 sec, and slowly lower the leg. Repeat 10-20 times 2-3 times daily. Perform this exercise against resistance later as your knee gets better.  °Quad and Hamstring Sets - Tighten up the muscle on the front of the thigh (Quad) and hold for 5-10 sec. Repeat this 10-20 times hourly. Hamstring sets are done by pushing the foot backward against an object  and holding for 5-10 sec. Repeat as with quad sets.  °· Leg Slides: Lying on your back, slowly slide your foot toward your buttocks, bending your knee up off the floor (only go as far as is comfortable). Then slowly slide your foot back down until your leg is flat on the floor again. °· Angel Wings: Lying on your back spread your legs to the side as far apart as you can without causing discomfort.  °A rehabilitation program following serious knee injuries can speed recovery and prevent re-injury in the future due to weakened muscles. Contact your doctor or a physical therapist for more information on knee rehabilitation.  ° °IF YOU ARE TRANSFERRED TO A SKILLED REHAB FACILITY °If the patient is transferred to a skilled rehab facility following release from the hospital, a list of the current medications will be sent to the facility for the patient to continue.  When discharged from the skilled rehab facility, please have the facility set up the patient's Home Health Physical Therapy prior to being released. Also, the skilled facility will be responsible for providing the patient with their medications at time of release from the facility to include their pain medication, the muscle relaxants, and their blood thinner medication. If the patient is still at the rehab facility at time of the two week follow up appointment, the skilled rehab facility will also need to assist the patient in arranging follow up appointment in our office and any transportation needs. ° °MAKE SURE YOU:  °Understand these instructions.  °Get help right away if you are not doing well or get worse.  ° ° °Pick up stool softner and laxative for home use following surgery while on pain medications. °Do not submerge incision under water. °Please use good hand washing techniques while changing dressing each day. °May shower starting three days after surgery. °Please use a clean towel to pat the incision dry following showers. °Continue to use ice for  pain and swelling after surgery. °Do not use any lotions or creams on the incision until instructed by your surgeon. ° °Take Xarelto for two and a half more weeks following discharge from the hospital, then discontinue Xarelto. °Once the patient has completed the Xarelto, they may resume the 81 mg Aspirin. ° ° ° °Information on   my medicine - XARELTO® (Rivaroxaban) ° °Why was Xarelto® prescribed for you? °Xarelto® was prescribed for you to reduce the risk of blood clots forming after orthopedic surgery. The medical term for these abnormal blood clots is venous thromboembolism (VTE). ° °What do you need to know about xarelto® ? °Take your Xarelto® ONCE DAILY at the same time every day. °You may take it either with or without food. ° °If you have difficulty swallowing the tablet whole, you may crush it and mix in applesauce just prior to taking your dose. ° °Take Xarelto® exactly as prescribed by your doctor and DO NOT stop taking Xarelto® without talking to the doctor who prescribed the medication.  Stopping without other VTE prevention medication to take the place of Xarelto® may increase your risk of developing a clot. ° °After discharge, you should have regular check-up appointments with your healthcare provider that is prescribing your Xarelto®.   ° °What do you do if you miss a dose? °If you miss a dose, take it as soon as you remember on the same day then continue your regularly scheduled once daily regimen the next day. Do not take two doses of Xarelto® on the same day.  ° °Important Safety Information °A possible side effect of Xarelto® is bleeding. You should call your healthcare provider right away if you experience any of the following: °? Bleeding from an injury or your nose that does not stop. °? Unusual colored urine (red or dark brown) or unusual colored stools (red or black). °? Unusual bruising for unknown reasons. °? A serious fall or if you hit your head (even if there is no bleeding). ° °Some  medicines may interact with Xarelto® and might increase your risk of bleeding while on Xarelto®. To help avoid this, consult your healthcare provider or pharmacist prior to using any new prescription or non-prescription medications, including herbals, vitamins, non-steroidal anti-inflammatory drugs (NSAIDs) and supplements. ° °This website has more information on Xarelto®: www.xarelto.com. ° ° °

## 2017-06-17 NOTE — Anesthesia Postprocedure Evaluation (Signed)
Anesthesia Post Note  Patient: Todd Chan  Procedure(s) Performed: RIGHT TOTAL KNEE ARTHROPLASTY (Right Knee)     Patient location during evaluation: PACU Anesthesia Type: Spinal and Regional Level of consciousness: awake Pain management: pain level controlled Vital Signs Assessment: post-procedure vital signs reviewed and stable Respiratory status: spontaneous breathing Cardiovascular status: stable Anesthetic complications: no    Last Vitals:  Vitals:   06/17/17 1600 06/17/17 1728  BP: (!) 164/93 (!) 139/94  Pulse: 75   Resp: 20 20  Temp: 36.6 C (!) 36.4 C  SpO2: 99% 95%    Last Pain:  Vitals:   06/17/17 1728  TempSrc: Oral  PainSc:                  [name]

## 2017-06-17 NOTE — Anesthesia Procedure Notes (Signed)
Date/Time: 06/17/2017 10:40 AM Performed by: Talbot Grumbling, CRNA Oxygen Delivery Method: Simple face mask

## 2017-06-17 NOTE — Op Note (Signed)
OPERATIVE REPORT-TOTAL KNEE ARTHROPLASTY   Pre-operative diagnosis- Osteoarthritis  Right knee(s)  Post-operative diagnosis- Osteoarthritis Right knee(s)  Procedure-  Right  Total Knee Arthroplasty  Surgeon- [name]. Clotee Schlicker, MD  Assistant- Arlee Muslim, PA-C   Anesthesia-  Adductor canal block and spinal  EBL-25 ml   Drains Hemovac  Tourniquet time-  Total Tourniquet Time Documented: Thigh (Right) - 44 minutes Total: Thigh (Right) - 44 minutes     Complications- None  Condition-PACU - hemodynamically stable.   Brief Clinical Note  Todd Chan is a 72 y.o. year old male with end stage OA of his right knee with progressively worsening pain and dysfunction. He has constant pain, with activity and at rest and significant functional deficits with difficulties even with ADLs. He has had extensive non-op management including analgesics, injections of cortisone and viscosupplements, and home exercise program, but remains in significant pain with significant dysfunction. Radiographs show bone on bone arthritis medial and patellofemoral. He presents now for right Total Knee Arthroplasty.    Procedure in detail---   The patient is brought into the operating room and positioned supine on the operating table. After successful administration of  Adductor canal block and spinal,   a tourniquet is placed high on the  Right thigh(s) and the lower extremity is prepped and draped in the usual sterile fashion. Time out is performed by the operating team and then the  Right lower extremity is wrapped in Esmarch, knee flexed and the tourniquet inflated to 300 mmHg.       A midline incision is made with a ten blade through the subcutaneous tissue to the level of the extensor mechanism. A fresh blade is used to make a medial parapatellar arthrotomy. Soft tissue over the proximal medial tibia is subperiosteally elevated to the joint line with a knife and into the semimembranosus bursa with a Cobb  elevator. Soft tissue over the proximal lateral tibia is elevated with attention being paid to avoiding the patellar tendon on the tibial tubercle. The patella is everted, knee flexed 90 degrees and the ACL and PCL are removed. Findings are bone on bone medial and patellofemoral with large global osteophytes.        The drill is used to create a starting hole in the distal femur and the canal is thoroughly irrigated with sterile saline to remove the fatty contents. The 5 degree Right  valgus alignment guide is placed into the femoral canal and the distal femoral cutting block is pinned to remove 9 mm off the distal femur. Resection is made with an oscillating saw.      The tibia is subluxed forward and the menisci are removed. The extramedullary alignment guide is placed referencing proximally at the medial aspect of the tibial tubercle and distally along the second metatarsal axis and tibial crest. The block is pinned to remove 66mm off the more deficient medial  side. Resection is made with an oscillating saw. Size 10is the most appropriate size for the tibia and the proximal tibia is prepared with the modular drill and keel punch for that size.      The femoral sizing guide is placed and size 10 is most appropriate. Rotation is marked off the epicondylar axis and confirmed by creating a rectangular flexion gap at 90 degrees. The size 10 cutting block is pinned in this rotation and the anterior, posterior and chamfer cuts are made with the oscillating saw. The intercondylar block is then placed and that cut is made.  Trial size 10 tibial component, trial size 10 posterior stabilized femur and a 12  mm posterior stabilized rotating platform insert trial is placed. Full extension is achieved with excellent varus/valgus and anterior/posterior balance throughout full range of motion. The patella is everted and thickness measured to be 27  mm. Free hand resection is taken to 15 mm, a 41 template is placed, lug  holes are drilled, trial patella is placed, and it tracks normally. Osteophytes are removed off the posterior femur with the trial in place. All trials are removed and the cut bone surfaces prepared with pulsatile lavage. Cement is mixed and once ready for implantation, the size 10 tibial implant, size  10 posterior stabilized femoral component, and the size 41 patella are cemented in place and the patella is held with the clamp. The trial insert is placed and the knee held in full extension. The Exparel (20 ml mixed with 60 ml saline) is injected into the extensor mechanism, posterior capsule, medial and lateral gutters and subcutaneous tissues.  All extruded cement is removed and once the cement is hard the permanent 12 mm posterior stabilized rotating platform insert is placed into the tibial tray.      The wound is copiously irrigated with saline solution and the extensor mechanism closed over a hemovac drain with #1 V-loc suture. The tourniquet is released for a total tourniquet time of 44  minutes. Flexion against gravity is 140 degrees and the patella tracks normally. Subcutaneous tissue is closed with 2.0 vicryl and subcuticular with running 4.0 Monocryl. The incision is cleaned and dried and steri-strips and a bulky sterile dressing are applied. The limb is placed into a knee immobilizer and the patient is awakened and transported to recovery in stable condition.      Please note that a surgical assistant was a medical necessity for this procedure in order to perform it in a safe and expeditious manner. Surgical assistant was necessary to retract the ligaments and vital neurovascular structures to prevent injury to them and also necessary for proper positioning of the limb to allow for anatomic placement of the prosthesis.   [name] Osha Errico, MD    06/17/2017, 11:48 AM

## 2017-06-17 NOTE — Progress Notes (Signed)
AssistedDr. Edwards with right, ultrasound guided, adductor canal block. Side rails up, monitors on throughout procedure. See vital signs in flow sheet. Tolerated Procedure well.  

## 2017-06-17 NOTE — Interval H&P Note (Signed)
History and Physical Interval Note:  06/17/2017 8:20 AM  Todd Chan  has presented today for surgery, with the diagnosis of Osteoarthritis Right knee  The various methods of treatment have been discussed with the patient and family. After consideration of risks, benefits and other options for treatment, the patient has consented to  Procedure(s): RIGHT TOTAL KNEE ARTHROPLASTY (Right) as a surgical intervention .  The patient's history has been reviewed, patient examined, no change in status, stable for surgery.  I have reviewed the patient's chart and labs.  Questions were answered to the patient's satisfaction.     [name]

## 2017-06-17 NOTE — Anesthesia Procedure Notes (Signed)
Spinal  Patient location during procedure: OR Start time: 06/17/2017 10:35 AM End time: 06/17/2017 10:38 AM Staffing Anesthesiologist: Belinda Block, MD Resident/CRNA: Talbot Grumbling, CRNA Performed: resident/CRNA  Preanesthetic Checklist Completed: patient identified, site marked, surgical consent, pre-op evaluation, timeout performed, IV checked, risks and benefits discussed and monitors and equipment checked Spinal Block Patient position: sitting Prep: DuraPrep Patient monitoring: heart rate, cardiac monitor, continuous pulse ox and blood pressure Approach: midline Location: L3-4 Injection technique: single-shot Needle Needle type: Sprotte and Pencan  Needle gauge: 24 G Needle length: 9 cm Assessment Sensory level: T4 Additional Notes Clear CSF, No paresthesia, patient tolerated well.

## 2017-06-17 NOTE — Transfer of Care (Signed)
Immediate Anesthesia Transfer of Care Note  Patient: Todd Chan  Procedure(s) Performed: RIGHT TOTAL KNEE ARTHROPLASTY (Right Knee)  Patient Location: PACU  Anesthesia Type:Spinal  Level of Consciousness: awake, alert  and oriented  Airway & Oxygen Therapy: Patient Spontanous Breathing and Patient connected to face mask oxygen  Post-op Assessment: Report given to RN and Post -op Vital signs reviewed and stable  Post vital signs: Reviewed and stable  Last Vitals:  Vitals:   06/17/17 1000 06/17/17 1015  BP: 135/82   Pulse: 71 73  Resp: 16 18  Temp:    SpO2: 97% 98%    Last Pain:  Vitals:   06/17/17 0802  TempSrc: Oral         Complications: No apparent anesthesia complications

## 2017-06-17 NOTE — Evaluation (Signed)
Physical Therapy Evaluation Patient Details Name: Todd Chan MRN: [id number] DOB: Oct 22, 1945 Today's Date: 06/17/2017   History of Present Illness  72 yo male s/p R TKA 06/17/17. Hx of L TKA 09/2016  Clinical Impression  On eval POD 0, pt required Min assist for mobility. He walked ~25 feet with a RW. Pain rated 5/10 with activity. Will follow and progress activity as tolerated. Per chart, plan is for OP PT.     Follow Up Recommendations Follow surgeon's recommendation for DC plan and follow-up therapies    Equipment Recommendations  None recommended by PT    Recommendations for Other Services       Precautions / Restrictions Precautions Precautions: Fall;Knee Required Braces or Orthoses: Knee Immobilizer - Right Knee Immobilizer - Right: Discontinue once straight leg raise with < 10 degree lag Restrictions Weight Bearing Restrictions: No RLE Weight Bearing: Weight bearing as tolerated      Mobility  Bed Mobility Overal bed mobility: Needs Assistance Bed Mobility: Supine to Sit     Supine to sit: HOB elevated;Min assist     General bed mobility comments: Assist for R LE. Increased time. Cues for technique.   Transfers Overall transfer level: Needs assistance Equipment used: Rolling walker (2 wheeled) Transfers: Sit to/from Stand Sit to Stand: Min assist;From elevated surface         General transfer comment: Assist to rise, stabilize, control descent. VCS safety, technique, hand/LE placement.   Ambulation/Gait Ambulation/Gait assistance: Min assist Ambulation Distance (Feet): 25 Feet Assistive device: Rolling walker (2 wheeled) Gait Pattern/deviations: Step-to pattern;Narrow base of support     General Gait Details: VCs safety, sequence, step length/width. Slow gait speed. Assist to stabilize pt.   Stairs            Wheelchair Mobility    Modified Rankin (Stroke Patients Only)       Balance Overall balance assessment: Needs assistance          Standing balance support: Bilateral upper extremity supported Standing balance-Leahy Scale: Poor                               Pertinent Vitals/Pain Pain Assessment: 0-10 Pain Score: 5  Pain Location: R knee Pain Descriptors / Indicators: Aching;Sore Pain Intervention(s): Monitored during session;Repositioned;Ice applied    Home Living Family/patient expects to be discharged to:: Private residence Living Arrangements: Spouse/significant other Available Help at Discharge: Available 24 hours/day Type of Home: House Home Access: Stairs to enter Entrance Stairs-Rails: None Entrance Stairs-Number of Steps: 1 Home Layout: Two level;Able to live on main level with bedroom/bathroom Home Equipment: Shower seat - built in;Cane - single point;Walker - 2 wheels      Prior Function Level of Independence: Independent               Hand Dominance        Extremity/Trunk Assessment   Upper Extremity Assessment Upper Extremity Assessment: Overall WFL for tasks assessed (tremor noted)    Lower Extremity Assessment Lower Extremity Assessment: Generalized weakness (s/p R TKA)    Cervical / Trunk Assessment Cervical / Trunk Assessment: Normal  Communication   Communication: No difficulties  Cognition Arousal/Alertness: Awake/alert Behavior During Therapy: WFL for tasks assessed/performed Overall Cognitive Status: Within Functional Limits for tasks assessed  General Comments      Exercises     Assessment/Plan    PT Assessment Patient needs continued PT services  PT Problem List Decreased strength;Decreased balance;Decreased range of motion;Decreased mobility;Decreased activity tolerance;Pain;Decreased knowledge of use of DME       PT Treatment Interventions DME instruction;Functional mobility training;Therapeutic activities;Balance training;Patient/family education;Therapeutic exercise;Stair  training;Gait training    PT Goals (Current goals can be found in the Care Plan section)  Acute Rehab PT Goals Patient Stated Goal: regain independence.  PT Goal Formulation: With patient/family Time For Goal Achievement: 07/01/17 Potential to Achieve Goals: Good    Frequency 7X/week   Barriers to discharge        Co-evaluation               AM-PAC PT "6 Clicks" Daily Activity  Outcome Measure Difficulty turning over in bed (including adjusting bedclothes, sheets and blankets)?: A Lot Difficulty moving from lying on back to sitting on the side of the bed? : Unable Difficulty sitting down on and standing up from a chair with arms (e.g., wheelchair, bedside commode, etc,.)?: Unable Help needed moving to and from a bed to chair (including a wheelchair)?: A Little Help needed walking in hospital room?: A Little Help needed climbing 3-5 steps with a railing? : A Little 6 Click Score: 13    End of Session Equipment Utilized During Treatment: Gait belt;Right knee immobilizer Activity Tolerance: Patient tolerated treatment well Patient left: in chair;with call bell/phone within reach;with family/visitor present   PT Visit Diagnosis: Pain;Difficulty in walking, not elsewhere classified (R26.2) Pain - Right/Left: Right Pain - part of body: Knee    Time: 3888-2800 PT Time Calculation (min) (ACUTE ONLY): 14 min   Charges:   PT Evaluation $PT Eval Low Complexity: 1 Low     PT G Codes:          Weston Anna, MPT Pager: [phone]

## 2017-06-17 NOTE — Anesthesia Preprocedure Evaluation (Addendum)
Anesthesia Evaluation  Patient identified by MRN, date of birth, ID band Patient awake    Reviewed: Allergy & Precautions, NPO status , Patient's Chart, lab work & pertinent test results  Airway Mallampati: II  TM Distance: >3 FB     Dental   Pulmonary former smoker,    breath sounds clear to auscultation       Cardiovascular hypertension, + CAD and + Peripheral Vascular Disease   Rhythm:Regular Rate:Normal     Neuro/Psych  Neuromuscular disease    GI/Hepatic Neg liver ROS, GERD  ,  Endo/Other  negative endocrine ROS  Renal/GU negative Renal ROS     Musculoskeletal  (+) Arthritis , Fibromyalgia -  Abdominal   Peds  Hematology   Anesthesia Other Findings   Reproductive/Obstetrics                             Anesthesia Physical Anesthesia Plan  ASA: III  Anesthesia Plan: Regional, General and Spinal   Post-op Pain Management:  Regional for Post-op pain   Induction: Intravenous  PONV Risk Score and Plan: 2 and Treatment may vary due to age or medical condition, Ondansetron, Dexamethasone and Midazolam  Airway Management Planned: Nasal Cannula and Simple Face Mask  Additional Equipment:   Intra-op Plan:   Post-operative Plan:   Informed Consent: I have reviewed the patients History and Physical, chart, labs and discussed the procedure including the risks, benefits and alternatives for the proposed anesthesia with the patient or authorized representative who has indicated his/her understanding and acceptance.   Dental advisory given  Plan Discussed with: CRNA and Anesthesiologist  Anesthesia Plan Comments:        Anesthesia Quick Evaluation

## 2017-06-17 NOTE — Plan of Care (Signed)
Plan of care 

## 2017-06-18 LAB — CBC
HCT: 34.5 % — ABNORMAL LOW (ref 39.0–52.0)
HEMOGLOBIN: 11.6 g/dL — AB (ref 13.0–17.0)
MCH: 33.8 pg (ref 26.0–34.0)
MCHC: 33.6 g/dL (ref 30.0–36.0)
MCV: 100.6 fL — AB (ref 78.0–100.0)
Platelets: 159 10*3/uL (ref 150–400)
RBC: 3.43 MIL/uL — AB (ref 4.22–5.81)
RDW: 12.9 % (ref 11.5–15.5)
WBC: 9.4 10*3/uL (ref 4.0–10.5)

## 2017-06-18 LAB — BASIC METABOLIC PANEL
Anion gap: 7 (ref 5–15)
BUN: 14 mg/dL (ref 6–20)
CHLORIDE: 105 mmol/L (ref 101–111)
CO2: 27 mmol/L (ref 22–32)
Calcium: 8.5 mg/dL — ABNORMAL LOW (ref 8.9–10.3)
Creatinine, Ser: 0.79 mg/dL (ref 0.61–1.24)
GFR calc Af Amer: >60 mL/min (ref >60)
GFR calc non Af Amer: >60 mL/min (ref >60)
Glucose, Bld: 128 mg/dL — ABNORMAL HIGH (ref 65–99)
POTASSIUM: 4 mmol/L (ref 3.5–5.1)
SODIUM: 139 mmol/L (ref 135–145)

## 2017-06-18 MED ORDER — RIVAROXABAN 10 MG PO TABS: 10.0000 mg | ORAL_TABLET | Freq: Every day | ORAL | 0 refills | Status: DC

## 2017-06-18 MED ORDER — METHOCARBAMOL 500 MG PO TABS: 500.0000 mg | ORAL_TABLET | Freq: Four times a day (QID) | ORAL | 0 refills | Status: DC | PRN

## 2017-06-18 MED ORDER — TRAMADOL HCL 50 MG PO TABS: 50.0000 mg | ORAL_TABLET | Freq: Four times a day (QID) | ORAL | 0 refills | Status: DC | PRN

## 2017-06-18 MED ORDER — OXYCODONE HCL 5 MG PO TABS: 5.0000 mg | ORAL_TABLET | ORAL | 0 refills | Status: DC | PRN

## 2017-06-18 NOTE — Progress Notes (Signed)
Spoke with patient and spouse at bedside. Confirmed plan for OP PT, already arranged. Has RW and 3n1. [phone] 

## 2017-06-18 NOTE — Discharge Summary (Signed)
Physician Discharge Summary   Patient ID: Todd Chan MRN: [id number] DOB/AGE: 1945/10/12 72 y.o.  Admit date: 06/17/2017 Discharge date: 06/19/2017  Primary Diagnosis:  Osteoarthritis Right knee(s)   Admission Diagnoses:  Past Medical History:  Diagnosis Date  . Arthritis   . GERD (gastroesophageal reflux disease)   . Hypertension   . Osteoarthritis of knee   . Painful urination   . Peripheral vascular disease ([place])    bilateral edema    Discharge Diagnoses:   Active Problems:   OA (osteoarthritis) of knee  Estimated body mass index is 38.52 kg/m as calculated from the following:   Height as of this encounter: 6' (1.829 m).   Weight as of this encounter: 128.8 kg (284 lb).  Procedure:  Procedure(s) (LRB): RIGHT TOTAL KNEE ARTHROPLASTY (Right)   Consults: None  HPI: Todd Chan is a 73 y.o. year old male with end stage OA of his right knee with progressively worsening pain and dysfunction. He has constant pain, with activity and at rest and significant functional deficits with difficulties even with ADLs. He has had extensive non-op management including analgesics, injections of cortisone and viscosupplements, and home exercise program, but remains in significant pain with significant dysfunction. Radiographs show bone on bone arthritis medial and patellofemoral. He presents now for right Total Knee Arthroplasty.    Laboratory Data: Admission on 06/17/2017  Component Date Value Ref Range Status  . WBC 06/18/2017 9.4  4.0 - 10.5 K/uL Final  . RBC 06/18/2017 3.43* 4.22 - 5.81 MIL/uL Final  . Hemoglobin 06/18/2017 11.6* 13.0 - 17.0 g/dL Final  . HCT 06/18/2017 34.5* 39.0 - 52.0 % Final  . MCV 06/18/2017 100.6* 78.0 - 100.0 fL Final  . MCH 06/18/2017 33.8  26.0 - 34.0 pg Final  . MCHC 06/18/2017 33.6  30.0 - 36.0 g/dL Final  . RDW 06/18/2017 12.9  11.5 - 15.5 % Final  . Platelets 06/18/2017 159  150 - 400 K/uL Final   Performed at [facility], [place] [address] [place], [place] [address]  . Sodium 06/18/2017 139  135 - 145 mmol/L Final  . Potassium 06/18/2017 4.0  3.5 - 5.1 mmol/L Final  . Chloride 06/18/2017 105  101 - 111 mmol/L Final  . CO2 06/18/2017 27  22 - 32 mmol/L Final  . Glucose, Bld 06/18/2017 128* 65 - 99 mg/dL Final  . BUN 06/18/2017 14  6 - 20 mg/dL Final  . Creatinine, Ser 06/18/2017 0.79  0.61 - 1.24 mg/dL Final  . Calcium 06/18/2017 8.5* 8.9 - 10.3 mg/dL Final  . GFR calc non Af Amer 06/18/2017 >60  >60 mL/min Final  . GFR calc Af Amer 06/18/2017 >60  >60 mL/min Final   Comment: (NOTE) The eGFR has been calculated using the CKD EPI equation. This calculation has not been validated in all clinical situations. eGFR's persistently <60 mL/min signify possible Chronic Kidney Disease.   Georgiann Hahn gap 06/18/2017 7  5 - 15 Final   Performed at [facility], [place] [address] [place], [place] [address]  Hospital Outpatient Visit on 06/13/2017  Component Date Value Ref Range Status  . MRSA, PCR 06/13/2017 NEGATIVE  NEGATIVE Final  . Staphylococcus aureus 06/13/2017 NEGATIVE  NEGATIVE Final   Comment: (NOTE) The Xpert SA Assay (FDA approved for NASAL specimens in patients 64 years of age and older), is one component of a comprehensive surveillance program. It is not intended to diagnose infection nor to guide or monitor treatment. Performed  at [facility], [place] [address], [place], [place] [address]   . aPTT 06/13/2017 29  24 - 36 seconds Final   Performed at [facility], [place] [address], [place], [place] [address]  . WBC 06/13/2017 6.7  4.0 - 10.5 K/uL Final  . RBC 06/13/2017 4.47  4.22 - 5.81 MIL/uL Final  . Hemoglobin 06/13/2017 15.3  13.0 - 17.0 g/dL Final  . HCT 06/13/2017 44.1  39.0 - 52.0 % Final  . MCV 06/13/2017 98.7  78.0 - 100.0 fL Final  . MCH 06/13/2017 34.2* 26.0 - 34.0 pg Final  . MCHC 06/13/2017 34.7  30.0 - 36.0 g/dL Final  . RDW  06/13/2017 12.9  11.5 - 15.5 % Final  . Platelets 06/13/2017 188  150 - 400 K/uL Final   Performed at [facility], [place] [address] [place], [place] [address]  . Sodium 06/13/2017 142  135 - 145 mmol/L Final  . Potassium 06/13/2017 4.9  3.5 - 5.1 mmol/L Final  . Chloride 06/13/2017 105  101 - 111 mmol/L Final  . CO2 06/13/2017 28  22 - 32 mmol/L Final  . Glucose, Bld 06/13/2017 94  65 - 99 mg/dL Final  . BUN 06/13/2017 12  6 - 20 mg/dL Final  . Creatinine, Ser 06/13/2017 0.89  0.61 - 1.24 mg/dL Final  . Calcium 06/13/2017 9.2  8.9 - 10.3 mg/dL Final  . Total Protein 06/13/2017 7.2  6.5 - 8.1 g/dL Final  . Albumin 06/13/2017 4.0  3.5 - 5.0 g/dL Final  . AST 06/13/2017 23  15 - 41 U/L Final  . ALT 06/13/2017 18  17 - 63 U/L Final  . Alkaline Phosphatase 06/13/2017 36* 38 - 126 U/L Final  . Total Bilirubin 06/13/2017 1.3* 0.3 - 1.2 mg/dL Final  . GFR calc non Af Amer 06/13/2017 >60  >60 mL/min Final  . GFR calc Af Amer 06/13/2017 >60  >60 mL/min Final   Comment: (NOTE) The eGFR has been calculated using the CKD EPI equation. This calculation has not been validated in all clinical situations. eGFR's persistently <60 mL/min signify possible Chronic Kidney Disease.   Georgiann Hahn gap 06/13/2017 9  5 - 15 Final   Performed at [facility], [place] [address], [place], [place] [address]  . Prothrombin Time 06/13/2017 13.3  11.4 - 15.2 seconds Final  . INR 06/13/2017 1.02   Final   Performed at [facility], [place] [address] [place], [place] [address]  . ABO/RH(D) 06/13/2017 A NEG   Final  . Antibody Screen 06/13/2017 NEG   Final  . Sample Expiration 06/13/2017 06/20/2017   Final  . Extend sample reason 06/13/2017    Final                   Value:NO TRANSFUSIONS OR PREGNANCY IN THE PAST 3 MONTHS Performed at [facility], [place] [address], [place],  [address]   Office Visit on 05/22/2017  Component Date Value Ref  Range Status  . Anit Nuclear Antibody(ANA) 05/22/2017 Negative  Negative Final  . ENA SSA (RO) Ab 05/22/2017 <0.2  0.0 - 0.9 AI Final  . ENA SSB (LA) Ab 05/22/2017 <0.2  0.0 - 0.9 AI Final  . Rhuematoid fact SerPl-aCnc 05/22/2017 <10.0  0.0 - 13.9 IU/mL Final  . Lead, Blood 05/22/2017 2  0 - 4 ug/dL Final   Comment: Testing performed by [employer] Estate manager/land agent.  Environmental Exposure:                            WHO Recommendation    <20                           Occupational Exposure:                            OSHA Lead Std          40                            BEI                    30                                 Detection Limit =  1 This test was developed and its performance characteristics determined by [facility]. It has not been cleared or approved by the Food and Drug Administration.   . Arsenic 05/22/2017 6  2 - 23 ug/L Final   Comment: This test was developed and its performance characteristics determined by [facility]. It has not been cleared or approved by the Food and Drug Administration.                                 Detection Limit = 1   . Mercury 05/22/2017 None Detected  0.0 - 14.9 ug/L Final   Comment: This test was developed and its performance characteristics determined by [facility]. It has not been cleared or approved by the Food and Drug Administration.                         Environmental Exposure:  <15.0                         Occupational Exposure:                          BEI - Inorganic Mercury: 15.0                                 Detection Limit =  1.0   . IgG (Immunoglobin G), Serum 05/22/2017 1,090  700 - 1,600 mg/dL Final  . IgA/Immunoglobulin A, Serum 05/22/2017 256  61 - 437 mg/dL Final  . IgM (Immunoglobulin M), Srm 05/22/2017 70  20 - 172 mg/dL Final  . Total Protein 05/22/2017 6.6  6.0 - 8.5 g/dL Final  . Albumin SerPl Elph-Mcnc 05/22/2017 3.9  2.9 - 4.4 g/dL Final  . Alpha 1 05/22/2017 0.2  0.0  - 0.4 g/dL Final  . Alpha2 Glob SerPl Elph-Mcnc 05/22/2017 0.5  0.4 - 1.0 g/dL Final  . B-Globulin SerPl Elph-Mcnc 05/22/2017 1.0  0.7 - 1.3 g/dL Final  . Gamma Glob SerPl Elph-Mcnc 05/22/2017 1.0  0.4 - 1.8 g/dL Final  . M Protein SerPl Elph-Mcnc 05/22/2017 Not Observed  Not Observed g/dL Final  . Globulin, Total 05/22/2017 2.7  2.2 - 3.9 g/dL Final  . Albumin/Glob SerPl 05/22/2017  1.5  0.7 - 1.7 Final  . IFE 1 05/22/2017 Comment   Final   An apparent normal immunofixation pattern.  . Please Note 05/22/2017 Comment   Final   Comment: Protein electrophoresis scan will follow via computer, mail, or courier delivery.   . Thiamine 05/22/2017 136.6  66.5 - 200.0 nmol/L Final   Comment: This test was developed and its performance characteristics determined by [facility]. It has not been cleared or approved by the Food and Drug Administration.   . Lyme IgG/IgM Ab 05/22/2017 <0.91  0.00 - 0.90 ISR Final   Comment:                                 Negative         <0.91                                 Equivocal  0.91 - 1.09                                 Positive         >1.09      X-Rays:No results found.  EKG: Orders placed or performed in visit on 04/11/17  . EKG 12-Lead     Hospital Course: Todd Chan is a 73 y.o. who was admitted to [facility]. They were brought to the operating room on 06/17/2017 and underwent Procedure(s): RIGHT TOTAL KNEE ARTHROPLASTY.  Patient tolerated the procedure well and was later transferred to the recovery room and then to the orthopaedic floor for postoperative care.  They were given PO and IV analgesics for pain control following their surgery.  They were given 24 hours of postoperative antibiotics of  Anti-infectives (From admission, onward)   Start     Dose/Rate Route Frequency Ordered Stop   06/17/17 1700  ceFAZolin (ANCEF) IVPB 2g/100 mL premix     2 g 200 mL/hr over 30 Minutes Intravenous Every 6 hours 06/17/17 1425 06/17/17 2302    06/17/17 0600  ceFAZolin (ANCEF) 3 g in dextrose 5 % 50 mL IVPB     3 g 130 mL/hr over 30 Minutes Intravenous On call to O.R. 06/16/17 1057 06/17/17 1045     and started on DVT prophylaxis in the form of Xarelto.   PT and OT were ordered for total joint protocol.  Discharge planning consulted to help with postop disposition and equipment needs.  Patient had a decent night on the evening of surgery. Did have some reflux. They started to get up OOB with therapy on day one. Hemovac drain was pulled without difficulty.  Continued to work with therapy into day two.  Dressing was checked and looked okay.   Patient was seen in rounds and was ready to go home on day two.   Diet - Cardiac diet Follow up - in 2 weeks Activity - WBAT Disposition - Home Condition Upon Discharge - Good D/C Meds - See DC Summary DVT Prophylaxis - Xarelto     Discharge Instructions    Call MD / Call 911   Complete by:  As directed    If you experience chest pain or shortness of breath, CALL 911 and be transported to the hospital emergency room.  If you develope a fever above 101 F, pus (white drainage)  or increased drainage or redness at the wound, or calf pain, call your surgeon's office.   Change dressing   Complete by:  As directed    Change dressing daily with sterile 4 x 4 inch gauze dressing and apply TED hose. Do not submerge the incision under water.   Constipation Prevention   Complete by:  As directed    Drink plenty of fluids.  Prune juice may be helpful.  You may use a stool softener, such as Colace (over the counter) 100 mg twice a day.  Use MiraLax (over the counter) for constipation as needed.   Diet - low sodium heart healthy   Complete by:  As directed    Discharge instructions   Complete by:  As directed    Take Xarelto for two and a half more weeks, then discontinue Xarelto. Once the patient has completed the Xarelto, they may resume the 81 mg Aspirin.   Pick up stool softner and laxative for  home use following surgery while on pain medications. Do not submerge incision under water. Please use good hand washing techniques while changing dressing each day. May shower starting three days after surgery. Please use a clean towel to pat the incision dry following showers. Continue to use ice for pain and swelling after surgery. Do not use any lotions or creams on the incision until instructed by your surgeon.  Wear both TED hose on both legs during the day every day for three weeks, but may remove the TED hose at night at home.  Postoperative Constipation Protocol  Constipation - defined medically as fewer than three stools per week and severe constipation as less than one stool per week.  One of the most common issues patients have following surgery is constipation.  Even if you have a regular bowel pattern at home, your normal regimen is likely to be disrupted due to multiple reasons following surgery.  Combination of anesthesia, postoperative narcotics, change in appetite and fluid intake all can affect your bowels.  In order to avoid complications following surgery, here are some recommendations in order to help you during your recovery period.  Colace (docusate) - Pick up an over-the-counter form of Colace or another stool softener and take twice a day as long as you are requiring postoperative pain medications.  Take with a full glass of water daily.  If you experience loose stools or diarrhea, hold the colace until you stool forms back up.  If your symptoms do not get better within 1 week or if they get worse, check with your doctor.  Dulcolax (bisacodyl) - Pick up over-the-counter and take as directed by the product packaging as needed to assist with the movement of your bowels.  Take with a full glass of water.  Use this product as needed if not relieved by Colace only.   MiraLax (polyethylene glycol) - Pick up over-the-counter to have on hand.  MiraLax is a solution that will  increase the amount of water in your bowels to assist with bowel movements.  Take as directed and can mix with a glass of water, juice, soda, coffee, or tea.  Take if you go more than two days without a movement. Do not use MiraLax more than once per day. Call your doctor if you are still constipated or irregular after using this medication for 7 days in a row.  If you continue to have problems with postoperative constipation, please contact the office for further assistance and recommendations.  If you  experience "the worst abdominal pain ever" or develop nausea or vomiting, please contact the office immediatly for further recommendations for treatment.   Do not put a pillow under the knee. Place it under the heel.   Complete by:  As directed    Do not sit on low chairs, stoools or toilet seats, as it may be difficult to get up from low surfaces   Complete by:  As directed    Driving restrictions   Complete by:  As directed    No driving until released by the physician.   Increase activity slowly as tolerated   Complete by:  As directed    Lifting restrictions   Complete by:  As directed    No lifting until released by the physician.   Patient may shower   Complete by:  As directed    You may shower without a dressing once there is no drainage.  Do not wash over the wound.  If drainage remains, do not shower until drainage stops.   TED hose   Complete by:  As directed    Use stockings (TED hose) for 3 weeks on both leg(s).  You may remove them at night for sleeping.   Weight bearing as tolerated   Complete by:  As directed    Laterality:  right   Extremity:  Lower     Allergies as of 06/18/2017   No Known Allergies     Medication List    STOP taking these medications   aspirin EC 81 MG tablet   FISH OIL PO   multivitamin with minerals Tabs tablet   naproxen sodium 220 MG tablet Commonly known as:  ALEVE   Vitamin D 2000 units Caps     TAKE these medications     lisinopril 10 MG tablet Commonly known as:  PRINIVIL,ZESTRIL Take 10 mg by mouth daily.   methocarbamol 500 MG tablet Commonly known as:  ROBAXIN Take 1 tablet (500 mg total) by mouth every 6 (six) hours as needed for muscle spasms.   metoprolol tartrate 25 MG tablet Commonly known as:  LOPRESSOR Take 1 tablet (25 mg total) by mouth 2 (two) times daily.   oxyCODONE 5 MG immediate release tablet Commonly known as:  Oxy IR/ROXICODONE Take 1-2 tablets (5-10 mg total) by mouth every 4 (four) hours as needed for moderate pain or severe pain.   pantoprazole 40 MG tablet Commonly known as:  PROTONIX Take 1 tablet (40 mg total) by mouth daily.   rivaroxaban 10 MG Tabs tablet Commonly known as:  XARELTO Take 1 tablet (10 mg total) by mouth daily with breakfast. Take Xarelto for two and a half more weeks following discharge from the hospital, then discontinue Xarelto. Once the patient has completed the Xarelto, they may resume the 81 mg Aspirin. Start taking on:  06/19/2017   traMADol 50 MG tablet Commonly known as:  ULTRAM Take 1-2 tablets (50-100 mg total) by mouth every 6 (six) hours as needed (mild pain).            Discharge Care Instructions  (From admission, onward)        Start     Ordered   06/18/17 0000  Weight bearing as tolerated    Question Answer Comment  Laterality right   Extremity Lower      06/18/17 2235   06/18/17 0000  Change dressing    Comments:  Change dressing daily with sterile 4 x 4 inch gauze dressing and apply TED  hose. Do not submerge the incision under water.   06/18/17 2235     Follow-up Information    Gaynelle Arabian, MD. Schedule an appointment as soon as possible for a visit on 07/02/2017.   Specialty:  Orthopedic Surgery Contact information: [address] [place] [address] [phone]           Signed: Arlee Muslim, PA-C Orthopaedic Surgery 06/18/2017, 10:37 PM

## 2017-06-18 NOTE — Progress Notes (Signed)
   Subjective: 1 Day Post-Op Procedure(s) (LRB): RIGHT TOTAL KNEE ARTHROPLASTY (Right) Patient reports pain as mild.   Patient seen in rounds for Dr. [name]. Patient is well, but has had some minor complaints of pain in the knee, requiring pain medications and also some reflux. We will start therapy today.  Plan is to go Home after hospital stay.  Objective: Vital signs in last 24 hours: Temp:  [97.5 F (36.4 C)-99 F (37.2 C)] 98.5 F (36.9 C) (03/19 2018) Pulse Rate:  [62-98] 98 (03/19 2018) Resp:  [15-20] 20 (03/19 2018) BP: (122-148)/(50-83) 148/50 (03/19 2018) SpO2:  [97 %-100 %] 98 % (03/19 2018) Weight:  [128.8 kg (284 lb)] 128.8 kg (284 lb) (03/19 0845)  Intake/Output from previous day:  Intake/Output Summary (Last 24 hours) at 06/18/2017 2226 Last data filed at 06/18/2017 1905 Gross per 24 hour  Intake 2360 ml  Output 1215 ml  Net 1145 ml    Intake/Output this shift: Total I/O In: 360 [P.O.:360] Out: -   Labs: Recent Labs    06/18/17 0601  HGB 11.6*   Recent Labs    06/18/17 0601  WBC 9.4  RBC 3.43*  HCT 34.5*  PLT 159   Recent Labs    06/18/17 0601  NA 139  K 4.0  CL 105  CO2 27  BUN 14  CREATININE 0.79  GLUCOSE 128*  CALCIUM 8.5*   No results for input(s): LABPT, INR in the last 72 hours.  EXAM General - Patient is Alert, Appropriate and Oriented Extremity - Neurovascular intact Sensation intact distally Intact pulses distally Dorsiflexion/Plantar flexion intact Dressing - dressing C/D/I Motor Function - intact, moving foot and toes well on exam.  Hemovac pulled without difficulty.  Past Medical History:  Diagnosis Date  . Arthritis   . GERD (gastroesophageal reflux disease)   . Hypertension   . Osteoarthritis of knee   . Painful urination   . Peripheral vascular disease ([place])    bilateral edema     Assessment/Plan: 1 Day Post-Op Procedure(s) (LRB): RIGHT TOTAL KNEE ARTHROPLASTY (Right) Active Problems:   OA  (osteoarthritis) of knee  Estimated body mass index is 38.52 kg/m as calculated from the following:   Height as of this encounter: 6' (1.829 m).   Weight as of this encounter: 128.8 kg (284 lb). Up with therapy Plan for discharge tomorrow  DVT Prophylaxis - Xarelto Weight-Bearing as tolerated to right leg D/C O2 and Pulse OX and try on Room Air  Arlee Muslim, PA-C Orthopaedic Surgery 06/18/2017, 10:26 PM

## 2017-06-18 NOTE — Progress Notes (Signed)
Physical Therapy Treatment Patient Details Name: Todd Chan MRN: [id number] DOB: 1946/02/17 Today's Date: 06/18/2017    History of Present Illness 72 yo male s/p R TKA 06/17/17. Hx of L TKA 09/2016    PT Comments    Progressing with mobility.    Follow Up Recommendations  Follow surgeon's recommendation for DC plan and follow-up therapies     Equipment Recommendations  None recommended by PT    Recommendations for Other Services       Precautions / Restrictions Precautions Precautions: Fall;Knee Required Braces or Orthoses: Knee Immobilizer - Right Knee Immobilizer - Right: Discontinue once straight leg raise with < 10 degree lag Restrictions Weight Bearing Restrictions: No RLE Weight Bearing: Weight bearing as tolerated    Mobility  Bed Mobility Overal bed mobility: Needs Assistance Bed Mobility: Supine to Sit     Supine to sit: Min guard     General bed mobility comments: close guard for safety. Increased time.   Transfers Overall transfer level: Needs assistance Equipment used: Rolling walker (2 wheeled) Transfers: Sit to/from Stand Sit to Stand: Min assist;From elevated surface         General transfer comment: Assist to steady. VCS safety, technique, hand/LE placement.   Ambulation/Gait Min Assist Ambulation Distance (Feet): 65 Feet Assistive device: Rolling walker (2 wheeled) Gait Pattern/deviations: Step-to pattern     General Gait Details: VCs safety, sequence, step length. Slow gait speed. Assist to stabilize pt intermittently. Gait improved as distance improved.    Stairs            Wheelchair Mobility    Modified Rankin (Stroke Patients Only)       Balance                                            Cognition Arousal/Alertness: Awake/alert Behavior During Therapy: WFL for tasks assessed/performed Overall Cognitive Status: Within Functional Limits for tasks assessed                                         Exercises Total Joint Exercises Ankle Circles/Pumps: AROM;Both;10 reps;Supine Quad Sets: AROM;Both;10 reps;Supine Heel Slides: AAROM;Right;10 reps;Supine Hip ABduction/ADduction: AAROM;Right;10 reps;Supine Straight Leg Raises: AAROM;Right;10 reps;Supine Goniometric ROM: ~10-60 degrees    General Comments        Pertinent Vitals/Pain Pain Assessment: 0-10 Pain Score: 6  Pain Location: R knee Pain Descriptors / Indicators: Sore;Aching Pain Intervention(s): Monitored during session;Repositioned;Ice applied    Home Living                      Prior Function            PT Goals (current goals can now be found in the care plan section) Progress towards PT goals: Progressing toward goals    Frequency    7X/week      PT Plan Current plan remains appropriate    Co-evaluation              AM-PAC PT "6 Clicks" Daily Activity  Outcome Measure  Difficulty turning over in bed (including adjusting bedclothes, sheets and blankets)?: A Little Difficulty moving from lying on back to sitting on the side of the bed? : A Little Difficulty sitting down on and standing up from a chair with  arms (e.g., wheelchair, bedside commode, etc,.)?: Unable Help needed moving to and from a bed to chair (including a wheelchair)?: A Little Help needed walking in hospital room?: A Little Help needed climbing 3-5 steps with a railing? : A Little 6 Click Score: 16    End of Session Equipment Utilized During Treatment: Gait belt;Right knee immobilizer Activity Tolerance: Patient tolerated treatment well Patient left: in chair;with call bell/phone within reach;with family/visitor present   PT Visit Diagnosis: Difficulty in walking, not elsewhere classified (R26.2);Pain Pain - Right/Left: Right Pain - part of body: Knee     Time: 0936-1000 PT Time Calculation (min) (ACUTE ONLY): 24 min  Charges:  $Gait Training: 8-22 mins $Therapeutic Exercise: 8-22  mins                    G Codes:          Weston Anna, MPT Pager: [phone]

## 2017-06-18 NOTE — Progress Notes (Signed)
Physical Therapy Treatment Patient Details Name: LAKEITH CAREAGA MRN: [id number] DOB: 05-18-1945 Today's Date: 06/18/2017    History of Present Illness 72 yo male s/p R TKA 06/17/17. Hx of L TKA 09/2016    PT Comments    Progressing with mobility. Plan is for d/c home on tomorrow.    Follow Up Recommendations  Follow surgeon's recommendation for DC plan and follow-up therapies     Equipment Recommendations  None recommended by PT    Recommendations for Other Services       Precautions / Restrictions Precautions Precautions: Fall;Knee Required Braces or Orthoses: Knee Immobilizer - Right Knee Immobilizer - Right: Discontinue once straight leg raise with < 10 degree lag Restrictions Weight Bearing Restrictions: No RLE Weight Bearing: Weight bearing as tolerated    Mobility  Bed Mobility Overal bed mobility: Needs Assistance Bed Mobility: Sit to Supine     Supine to sit: Min guard Sit to supine: Min guard   General bed mobility comments: close guard for safety. Increased time.   Transfers Overall transfer level: Needs assistance Equipment used: Rolling walker (2 wheeled) Transfers: Sit to/from Stand Sit to Stand: Min guard         General transfer comment: Assist to steady. VCS safety, technique, hand/LE placement.   Ambulation/Gait Ambulation/Gait assistance: Min guard Ambulation Distance (Feet): 85 Feet Assistive device: Rolling walker (2 wheeled) Gait Pattern/deviations: Step-to pattern     General Gait Details: VCs safety, sequence, step length. Slow gait speed. Close guard for safety.    Stairs            Wheelchair Mobility    Modified Rankin (Stroke Patients Only)       Balance                                            Cognition Arousal/Alertness: Awake/alert Behavior During Therapy: WFL for tasks assessed/performed Overall Cognitive Status: Within Functional Limits for tasks assessed                                         Exercises     General Comments        Pertinent Vitals/Pain Pain Assessment: 0-10 Pain Score: 6  Pain Location: R knee Pain Descriptors / Indicators: Sore;Aching Pain Intervention(s): Monitored during session;Repositioned;Ice applied    Home Living                      Prior Function            PT Goals (current goals can now be found in the care plan section) Progress towards PT goals: Progressing toward goals    Frequency    7X/week      PT Plan Current plan remains appropriate    Co-evaluation              AM-PAC PT "6 Clicks" Daily Activity  Outcome Measure  Difficulty turning over in bed (including adjusting bedclothes, sheets and blankets)?: A Little Difficulty moving from lying on back to sitting on the side of the bed? : A Little Difficulty sitting down on and standing up from a chair with arms (e.g., wheelchair, bedside commode, etc,.)?: A Little Help needed moving to and from a bed to chair (including a wheelchair)?: A Little  Help needed walking in hospital room?: A Little Help needed climbing 3-5 steps with a railing? : A Little 6 Click Score: 18    End of Session Equipment Utilized During Treatment: Gait belt;Right knee immobilizer Activity Tolerance: Patient tolerated treatment well Patient left: in chair;with call bell/phone within reach;with family/visitor present   PT Visit Diagnosis: Difficulty in walking, not elsewhere classified (R26.2);Pain Pain - Right/Left: Right Pain - part of body: Knee     Time: 8864-8472 PT Time Calculation (min) (ACUTE ONLY): 15 min  Charges:  $Gait Training: 8-22 mins $Therapeutic Exercise: 8-22 mins                    G Codes:          Weston Anna, MPT Pager: [phone]

## 2017-06-18 NOTE — Plan of Care (Signed)
Compliant with plan of care.  Verbalized understanding and demonstrated consistent use of incentive spirometer.  Pain managed. No GI/GU complaints. Expressive of his needs.

## 2017-06-19 LAB — CBC
HEMATOCRIT: 33.8 % — AB (ref 39.0–52.0)
HEMOGLOBIN: 11.1 g/dL — AB (ref 13.0–17.0)
MCH: 33.2 pg (ref 26.0–34.0)
MCHC: 32.8 g/dL (ref 30.0–36.0)
MCV: 101.2 fL — ABNORMAL HIGH (ref 78.0–100.0)
Platelets: 168 10*3/uL (ref 150–400)
RBC: 3.34 MIL/uL — ABNORMAL LOW (ref 4.22–5.81)
RDW: 13.2 % (ref 11.5–15.5)
WBC: 10.5 10*3/uL (ref 4.0–10.5)

## 2017-06-19 LAB — BASIC METABOLIC PANEL
Anion gap: 9 (ref 5–15)
BUN: 12 mg/dL (ref 6–20)
CHLORIDE: 104 mmol/L (ref 101–111)
CO2: 27 mmol/L (ref 22–32)
CREATININE: 0.76 mg/dL (ref 0.61–1.24)
Calcium: 8.6 mg/dL — ABNORMAL LOW (ref 8.9–10.3)
GFR calc Af Amer: >60 mL/min (ref >60)
GFR calc non Af Amer: >60 mL/min (ref >60)
Glucose, Bld: 111 mg/dL — ABNORMAL HIGH (ref 65–99)
Potassium: 3.7 mmol/L (ref 3.5–5.1)
Sodium: 140 mmol/L (ref 135–145)

## 2017-06-19 NOTE — Progress Notes (Signed)
Physical Therapy Treatment Patient Details Name: Todd Chan MRN: [id number] DOB: Apr 21, 1945 Today's Date: 06/19/2017    History of Present Illness 72 yo male s/p R TKA 06/17/17. Hx of L TKA 09/2016    PT Comments    POD # 2 Assisted to EOB.  Instructed spouse proper application and use.  Assisted with amb in hallway then returned to room to perform all supine TKR TE's following HEP handout.  Instructed on proper tech, freq as well as use of ICE.   Follow Up Recommendations  Follow surgeon's recommendation for DC plan and follow-up therapies     Equipment Recommendations  None recommended by PT    Recommendations for Other Services       Precautions / Restrictions Precautions Precautions: Fall;Knee Precaution Comments: instructed on KI use until able to perfrom SLR Required Braces or Orthoses: Knee Immobilizer - Right Knee Immobilizer - Right: Discontinue once straight leg raise with < 10 degree lag Restrictions Weight Bearing Restrictions: No RLE Weight Bearing: Weight bearing as tolerated    Mobility  Bed Mobility Overal bed mobility: Needs Assistance Bed Mobility: Supine to Sit     Supine to sit: Min assist     General bed mobility comments: assist R LE off bed  Transfers Overall transfer level: Needs assistance Equipment used: Rolling walker (2 wheeled) Transfers: Sit to/from Stand Sit to Stand: Supervision         General transfer comment: Assist to steady. VCS safety, technique, hand/LE placement.   Ambulation/Gait Ambulation/Gait assistance: Supervision Ambulation Distance (Feet): 90 Feet Assistive device: Rolling walker (2 wheeled) Gait Pattern/deviations: Step-to pattern Gait velocity: decreased   General Gait Details: VCs safety, sequence, step length. Slow gait speed. Close guard for safety.    Stairs            Wheelchair Mobility    Modified Rankin (Stroke Patients Only)       Balance                                            Cognition Arousal/Alertness: Awake/alert Behavior During Therapy: WFL for tasks assessed/performed Overall Cognitive Status: Within Functional Limits for tasks assessed                                        Exercises   Total Knee Replacement TE's 10 reps B LE ankle pumps 10 reps towel squeezes 10 reps knee presses 10 reps heel slides  10 reps SAQ's 10 reps SLR's 10 reps ABD Followed by ICE     General Comments        Pertinent Vitals/Pain Pain Assessment: 0-10 Pain Score: 5  Pain Location: R knee Pain Descriptors / Indicators: Sore;Aching;Operative site guarding Pain Intervention(s): Monitored during session;Repositioned;Ice applied;Premedicated before session    Home Living                      Prior Function            PT Goals (current goals can now be found in the care plan section) Progress towards PT goals: Progressing toward goals    Frequency    7X/week      PT Plan Current plan remains appropriate    Co-evaluation  AM-PAC PT "6 Clicks" Daily Activity  Outcome Measure  Difficulty turning over in bed (including adjusting bedclothes, sheets and blankets)?: A Little Difficulty moving from lying on back to sitting on the side of the bed? : A Little Difficulty sitting down on and standing up from a chair with arms (e.g., wheelchair, bedside commode, etc,.)?: A Little Help needed moving to and from a bed to chair (including a wheelchair)?: A Little Help needed walking in hospital room?: A Little Help needed climbing 3-5 steps with a railing? : A Little 6 Click Score: 18    End of Session Equipment Utilized During Treatment: Gait belt;Right knee immobilizer Activity Tolerance: Patient tolerated treatment well Patient left: in chair;with call bell/phone within reach;with family/visitor present Nurse Communication: (pt ready for D/C to home) PT Visit Diagnosis: Difficulty in  walking, not elsewhere classified (R26.2);Pain Pain - Right/Left: Right Pain - part of body: Knee     Time: 1000-1025 PT Time Calculation (min) (ACUTE ONLY): 25 min  Charges:  $Gait Training: 8-22 mins $Therapeutic Exercise: 8-22 mins                    G Codes:       Rica Koyanagi  PTA [facility]  Acute  Rehab Pager      [phone]

## 2017-06-19 NOTE — Progress Notes (Signed)
   Subjective: 2 Days Post-Op Procedure(s) (LRB): RIGHT TOTAL KNEE ARTHROPLASTY (Right) Patient reports pain as mild.   Patient seen in rounds for Dr. [name]. Patient is well, and has had no acute complaints or problems Patient is ready to go home  Objective: Vital signs in last 24 hours: Temp:  [97.5 F (36.4 C)-99 F (37.2 C)] 98.6 F (37 C) (03/20 0532) Pulse Rate:  [70-98] 85 (03/20 0532) Resp:  [15-20] 15 (03/20 0532) BP: (127-151)/(50-89) 151/89 (03/20 0532) SpO2:  [95 %-100 %] 95 % (03/20 0532) Weight:  [128.8 kg (284 lb)] 128.8 kg (284 lb) (03/19 0845)  Intake/Output from previous day:  Intake/Output Summary (Last 24 hours) at 06/19/2017 0743 Last data filed at 06/19/2017 0130 Gross per 24 hour  Intake 2080 ml  Output 1500 ml  Net 580 ml    Intake/Output this shift: No intake/output data recorded.  Labs: Recent Labs    06/18/17 0601 06/19/17 0557  HGB 11.6* 11.1*   Recent Labs    06/18/17 0601 06/19/17 0557  WBC 9.4 10.5  RBC 3.43* 3.34*  HCT 34.5* 33.8*  PLT 159 168   Recent Labs    06/18/17 0601 06/19/17 0557  NA 139 140  K 4.0 3.7  CL 105 104  CO2 27 27  BUN 14 12  CREATININE 0.79 0.76  GLUCOSE 128* 111*  CALCIUM 8.5* 8.6*   No results for input(s): LABPT, INR in the last 72 hours.  EXAM: General - Patient is Alert, Appropriate and Oriented Extremity - Neurovascular intact Sensation intact distally Intact pulses distally Dorsiflexion/Plantar flexion intact Incision - clean, dry Motor Function - intact, moving foot and toes well on exam.   Assessment/Plan: 2 Days Post-Op Procedure(s) (LRB): RIGHT TOTAL KNEE ARTHROPLASTY (Right) Procedure(s) (LRB): RIGHT TOTAL KNEE ARTHROPLASTY (Right) Past Medical History:  Diagnosis Date  . Arthritis   . GERD (gastroesophageal reflux disease)   . Hypertension   . Osteoarthritis of knee   . Painful urination   . Peripheral vascular disease (HCC)    bilateral edema    Active Problems:  OA (osteoarthritis) of knee  Estimated body mass index is 38.52 kg/m as calculated from the following:   Height as of this encounter: 6' (1.829 m).   Weight as of this encounter: 128.8 kg (284 lb). Up with therapy Diet - Cardiac diet Follow up - in 2 weeks Activity - WBAT Disposition - Home Condition Upon Discharge - Good D/C Meds - See DC Summary DVT Prophylaxis - Xarelto  Arlee Muslim, PA-C Orthopaedic Surgery 06/19/2017, 7:43 AM

## 2017-06-20 DIAGNOSIS — M1711 Unilateral primary osteoarthritis, right knee: Secondary | ICD-10-CM | POA: Diagnosis not present

## 2017-06-24 DIAGNOSIS — M1711 Unilateral primary osteoarthritis, right knee: Secondary | ICD-10-CM | POA: Diagnosis not present

## 2017-06-26 DIAGNOSIS — M1711 Unilateral primary osteoarthritis, right knee: Secondary | ICD-10-CM | POA: Diagnosis not present

## 2017-06-28 DIAGNOSIS — M1711 Unilateral primary osteoarthritis, right knee: Secondary | ICD-10-CM | POA: Diagnosis not present

## 2017-07-01 DIAGNOSIS — M1711 Unilateral primary osteoarthritis, right knee: Secondary | ICD-10-CM | POA: Diagnosis not present

## 2017-07-03 DIAGNOSIS — M1711 Unilateral primary osteoarthritis, right knee: Secondary | ICD-10-CM | POA: Diagnosis not present

## 2017-07-05 DIAGNOSIS — M1711 Unilateral primary osteoarthritis, right knee: Secondary | ICD-10-CM | POA: Diagnosis not present

## 2017-07-08 DIAGNOSIS — M1711 Unilateral primary osteoarthritis, right knee: Secondary | ICD-10-CM | POA: Diagnosis not present

## 2017-07-10 DIAGNOSIS — M1711 Unilateral primary osteoarthritis, right knee: Secondary | ICD-10-CM | POA: Diagnosis not present

## 2017-07-12 DIAGNOSIS — M1711 Unilateral primary osteoarthritis, right knee: Secondary | ICD-10-CM | POA: Diagnosis not present

## 2017-07-15 DIAGNOSIS — M1711 Unilateral primary osteoarthritis, right knee: Secondary | ICD-10-CM | POA: Diagnosis not present

## 2017-07-18 DIAGNOSIS — M1711 Unilateral primary osteoarthritis, right knee: Secondary | ICD-10-CM | POA: Diagnosis not present

## 2017-07-22 DIAGNOSIS — M1711 Unilateral primary osteoarthritis, right knee: Secondary | ICD-10-CM | POA: Diagnosis not present

## 2017-07-23 DIAGNOSIS — Z96651 Presence of right artificial knee joint: Secondary | ICD-10-CM | POA: Diagnosis not present

## 2017-07-23 DIAGNOSIS — Z471 Aftercare following joint replacement surgery: Secondary | ICD-10-CM | POA: Diagnosis not present

## 2017-07-25 DIAGNOSIS — M1711 Unilateral primary osteoarthritis, right knee: Secondary | ICD-10-CM | POA: Diagnosis not present

## 2017-08-07 DIAGNOSIS — R300 Dysuria: Secondary | ICD-10-CM | POA: Diagnosis not present

## 2017-10-24 ENCOUNTER — Encounter: Payer: Self-pay | Admitting: Cardiology

## 2017-12-20 DIAGNOSIS — R05 Cough: Secondary | ICD-10-CM | POA: Diagnosis not present

## 2017-12-20 DIAGNOSIS — Z125 Encounter for screening for malignant neoplasm of prostate: Secondary | ICD-10-CM | POA: Diagnosis not present

## 2017-12-20 DIAGNOSIS — Z23 Encounter for immunization: Secondary | ICD-10-CM | POA: Diagnosis not present

## 2017-12-20 DIAGNOSIS — I10 Essential (primary) hypertension: Secondary | ICD-10-CM | POA: Diagnosis not present

## 2017-12-30 DIAGNOSIS — Z125 Encounter for screening for malignant neoplasm of prostate: Secondary | ICD-10-CM | POA: Diagnosis not present

## 2017-12-30 DIAGNOSIS — I10 Essential (primary) hypertension: Secondary | ICD-10-CM | POA: Diagnosis not present

## 2017-12-30 DIAGNOSIS — R05 Cough: Secondary | ICD-10-CM | POA: Diagnosis not present

## 2018-03-10 DIAGNOSIS — K219 Gastro-esophageal reflux disease without esophagitis: Secondary | ICD-10-CM | POA: Diagnosis not present

## 2018-03-10 DIAGNOSIS — I10 Essential (primary) hypertension: Secondary | ICD-10-CM | POA: Diagnosis not present

## 2018-03-31 DIAGNOSIS — R21 Rash and other nonspecific skin eruption: Secondary | ICD-10-CM | POA: Diagnosis not present

## 2018-04-05 ENCOUNTER — Emergency Department (HOSPITAL_BASED_OUTPATIENT_CLINIC_OR_DEPARTMENT_OTHER): Payer: Medicare Other

## 2018-04-05 ENCOUNTER — Emergency Department (HOSPITAL_BASED_OUTPATIENT_CLINIC_OR_DEPARTMENT_OTHER)
Admission: EM | Admit: 2018-04-05 | Discharge: 2018-04-05 | Disposition: A | Discharge status: Home / Self Care | Payer: Medicare Other | Attending: Emergency Medicine | Admitting: Emergency Medicine

## 2018-04-05 ENCOUNTER — Other Ambulatory Visit: Payer: Self-pay

## 2018-04-05 ENCOUNTER — Encounter (HOSPITAL_BASED_OUTPATIENT_CLINIC_OR_DEPARTMENT_OTHER): Payer: Self-pay | Admitting: Adult Health

## 2018-04-05 DIAGNOSIS — R0781 Pleurodynia: Secondary | ICD-10-CM

## 2018-04-05 DIAGNOSIS — Z96651 Presence of right artificial knee joint: Secondary | ICD-10-CM | POA: Insufficient documentation

## 2018-04-05 DIAGNOSIS — I10 Essential (primary) hypertension: Secondary | ICD-10-CM | POA: Diagnosis not present

## 2018-04-05 DIAGNOSIS — Z87891 Personal history of nicotine dependence: Secondary | ICD-10-CM | POA: Insufficient documentation

## 2018-04-05 DIAGNOSIS — J9811 Atelectasis: Secondary | ICD-10-CM | POA: Diagnosis not present

## 2018-04-05 DIAGNOSIS — R079 Chest pain, unspecified: Secondary | ICD-10-CM | POA: Diagnosis present

## 2018-04-05 DIAGNOSIS — Z79899 Other long term (current) drug therapy: Secondary | ICD-10-CM | POA: Insufficient documentation

## 2018-04-05 DIAGNOSIS — R071 Chest pain on breathing: Secondary | ICD-10-CM | POA: Diagnosis not present

## 2018-04-05 DIAGNOSIS — Z96652 Presence of left artificial knee joint: Secondary | ICD-10-CM | POA: Diagnosis not present

## 2018-04-05 DIAGNOSIS — K8020 Calculus of gallbladder without cholecystitis without obstruction: Secondary | ICD-10-CM | POA: Diagnosis not present

## 2018-04-05 LAB — BASIC METABOLIC PANEL
Anion gap: 8 (ref 5–15)
BUN: 20 mg/dL (ref 8–23)
CO2: 26 mmol/L (ref 22–32)
Calcium: 9.2 mg/dL (ref 8.9–10.3)
Chloride: 103 mmol/L (ref 98–111)
Creatinine, Ser: 0.88 mg/dL (ref 0.61–1.24)
GFR calc non Af Amer: >60 mL/min (ref >60)
Glucose, Bld: 108 mg/dL — ABNORMAL HIGH (ref 70–99)
Potassium: 4 mmol/L (ref 3.5–5.1)
SODIUM: 137 mmol/L (ref 135–145)

## 2018-04-05 LAB — CBC
HEMATOCRIT: 47.3 % (ref 39.0–52.0)
Hemoglobin: 15.5 g/dL (ref 13.0–17.0)
MCH: 33 pg (ref 26.0–34.0)
MCHC: 32.8 g/dL (ref 30.0–36.0)
MCV: 100.9 fL — ABNORMAL HIGH (ref 80.0–100.0)
Platelets: 153 10*3/uL (ref 150–400)
RBC: 4.69 MIL/uL (ref 4.22–5.81)
RDW: 13 % (ref 11.5–15.5)
WBC: 7.8 10*3/uL (ref 4.0–10.5)
nRBC: 0 % (ref 0.0–0.2)

## 2018-04-05 LAB — D-DIMER, QUANTITATIVE: D-Dimer, Quant: 5.15 ug/mL-FEU — ABNORMAL HIGH (ref 0.00–0.50)

## 2018-04-05 LAB — TROPONIN I: Troponin I: <0.03 ng/mL (ref <0.03)

## 2018-04-05 MED ORDER — IOPAMIDOL (ISOVUE-370) INJECTION 76%
100.0000 mL | Freq: Once | INTRAVENOUS | Status: AC
  Administered 2018-04-05: 100 mL via INTRAVENOUS

## 2018-04-05 MED ORDER — METHOCARBAMOL 500 MG PO TABS
750.0000 mg | ORAL_TABLET | Freq: Once | ORAL | Status: AC
  Administered 2018-04-05: 750 mg via ORAL
  Filled 2018-04-05: qty 2

## 2018-04-05 MED ORDER — OXYCODONE-ACETAMINOPHEN 5-325 MG PO TABS
1.0000 | ORAL_TABLET | Freq: Once | ORAL | Status: AC
  Administered 2018-04-05: 1 via ORAL
  Filled 2018-04-05: qty 1

## 2018-04-05 NOTE — ED Provider Notes (Signed)
[facility] EMERGENCY DEPARTMENT Provider Note   CSN: [id number] Arrival date & time: 04/05/18  1705     History   Chief Complaint Chief Complaint  Patient presents with  . Chest Pain    HPI Todd Chan is a 73 y.o. male.  HPI Patient reports he is getting a severe pain in his left back along the inside of his shoulder blade.  He has sharp chest pain in association with that.  He reports sometimes it feels like it just goes straight through.  Patient denies recent fever cough.  He denies associate abdominal pain or vomiting.  Patient has had this occasionally in the past.  He reports sometimes he tries a heating pad or massage.  Sometimes that gives temporary relief. Past Medical History:  Diagnosis Date  . Arthritis   . GERD (gastroesophageal reflux disease)   . Hypertension   . Osteoarthritis of knee   . Painful urination   . Peripheral vascular disease ([place])    bilateral edema     Patient Active Problem List   Diagnosis Date Noted  . Abnormal nuclear stress test   . Exertional dyspnea   . Abnormal myocardial perfusion study 04/11/2017  . Chest pain in adult 03/18/2017  . Edema 03/18/2017  . Coronary atherosclerosis 03/18/2017  . Peripheral vascular disease ([place])   . Painful urination   . Osteoarthritis of knee   . Hypertension   . Hyperlipidemia   . GERD (gastroesophageal reflux disease)   . Fibromyalgia   . Arthritis   . OA (osteoarthritis) of knee 10/08/2016    Past Surgical History:  Procedure Laterality Date  . CARDIAC CATHETERIZATION  04/2017  . ESOPHAGOGASTRODUODENOSCOPY ENDOSCOPY     8 days ago  . KNEE ARTHROSCOPY Left 6-7 years ago  . LEFT HEART CATH AND CORONARY ANGIOGRAPHY N/A 04/17/2017   Procedure: LEFT HEART CATH AND CORONARY ANGIOGRAPHY;  Surgeon: Troy Sine, MD;  Location: [facility];  Service: Cardiovascular;  Laterality: N/A;  . 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 TONSILLECTOMY    . TOTAL KNEE  ARTHROPLASTY Left 10/08/2016   Procedure: LEFT TOTAL KNEE ARTHROPLASTY;  Surgeon: Gaynelle Arabian, MD;  Location: [facility];  Service: Orthopedics;  Laterality: Left;  . TOTAL KNEE ARTHROPLASTY Right 06/17/2017   Procedure: RIGHT TOTAL KNEE ARTHROPLASTY;  Surgeon: Gaynelle Arabian, MD;  Location: [facility];  Service: Orthopedics;  Laterality: Right;        Home Medications    Prior to Admission medications   Medication Sig Start Date End Date Taking? Authorizing Provider  lisinopril (PRINIVIL,ZESTRIL) 10 MG tablet Take 10 mg by mouth daily.    [provider]  methocarbamol (ROBAXIN) 500 MG tablet Take 1 tablet (500 mg total) by mouth every 6 (six) hours as needed for muscle spasms. 06/18/17   Perkins, Alexzandrew L, PA-C  metoprolol tartrate (LOPRESSOR) 25 MG tablet Take 1 tablet (25 mg total) by mouth 2 (two) times daily. 05/09/17 08/07/17  Richardo Priest, MD  oxyCODONE (OXY IR/ROXICODONE) 5 MG immediate release tablet Take 1-2 tablets (5-10 mg total) by mouth every 4 (four) hours as needed for moderate pain or severe pain. 06/18/17   Perkins, Alexzandrew L, PA-C  pantoprazole (PROTONIX) 40 MG tablet Take 1 tablet (40 mg total) by mouth daily. 04/25/17   Richardo Priest, MD  rivaroxaban (XARELTO) 10 MG TABS tablet Take 1 tablet (10 mg total) by mouth daily with breakfast. Take Xarelto for two and a half more weeks  following discharge from the hospital, then discontinue Xarelto. Once the patient has completed the Xarelto, they may resume the 81 mg Aspirin. 06/19/17   Perkins, Alexzandrew L, PA-C  traMADol (ULTRAM) 50 MG tablet Take 1-2 tablets (50-100 mg total) by mouth every 6 (six) hours as needed (mild pain). 06/18/17   Perkins, Alexzandrew L, PA-C    Family History Family History  Problem Relation Age of Onset  . Prostate cancer Father   . Breast cancer Sister   . Neuropathy Neg Hx     Social History Social History   Tobacco Use  . Smoking status: Former Smoker    Packs/day: 0.50     Last attempt to quit: 10/1987    Years since quitting: 30.5  . Smokeless tobacco: Never Used  . Tobacco comment: quit 30 yeara ago   Substance Use Topics  . Alcohol use: Yes    Comment: 3 drinks per day- wine and scotch  . Drug use: No     Allergies   Patient has no known allergies.   Review of Systems Review of Systems 10 Systems reviewed and are negative for acute change except as noted in the HPI.  Physical Exam Updated Vital Signs BP (!) 160/113 (BP Location: Left Arm)   Pulse (!) 113   Temp 98.9 F (37.2 C) (Oral)   Resp 19   SpO2 97%   Physical Exam Constitutional:      Comments: Patient is alert nontoxic no respiratory distress at rest.  Central obesity and physical deconditioning.  HENT:     Head: Normocephalic and atraumatic.  Eyes:     Extraocular Movements: Extraocular movements intact.  Neck:     Musculoskeletal: Normal range of motion and neck supple.  Cardiovascular:     Rate and Rhythm: Normal rate and regular rhythm.     Pulses: Normal pulses.     Heart sounds: Normal heart sounds.  Pulmonary:     Effort: Pulmonary effort is normal.     Breath sounds: Normal breath sounds.  Abdominal:     General: There is no distension.     Palpations: Abdomen is soft.     Tenderness: There is no abdominal tenderness. There is no guarding.  Musculoskeletal: Normal range of motion.        General: No swelling or tenderness.     Right lower leg: Edema present.     Left lower leg: Edema present.  Skin:    General: Skin is warm and dry.  Neurological:     General: No focal deficit present.     Mental Status: He is oriented to person, place, and time.     Coordination: Coordination normal.  Psychiatric:        Mood and Affect: Mood normal.      ED Treatments / Results  Labs (all labs ordered are listed, but only abnormal results are displayed) Labs Reviewed  BASIC METABOLIC PANEL - Abnormal; Notable for the following components:      Result Value    Glucose, Bld 108 (*)    All other components within normal limits  CBC - Abnormal; Notable for the following components:   MCV 100.9 (*)    All other components within normal limits  D-DIMER, QUANTITATIVE (NOT AT [facility]) - Abnormal; Notable for the following components:   D-Dimer, Quant 5.15 (*)    All other components within normal limits  TROPONIN I    EKG EKG Interpretation  Date/Time:  Saturday April 05 2018 17:11:38 EST  Ventricular Rate:  105 PR Interval:  148 QRS Duration: 94 QT Interval:  340 QTC Calculation: 449 R Axis:   10 Text Interpretation:  Sinus tachycardia Otherwise normal ECG agree. no change from previous Confirmed by Charlesetta Shanks [phone]) on 04/05/2018 5:16:05 PM   Radiology Dg Chest 2 View  Result Date: 04/05/2018 CLINICAL DATA:  Chest pain.  Back pain. EXAM: CHEST - 2 VIEW COMPARISON:  03/08/2017 FINDINGS: The lung volumes appear low. Asymmetric elevation of the right hemidiaphragm noted. Platelike atelectasis noted within both lung bases. IMPRESSION: Diminished lung volumes with bibasilar atelectasis. Electronically Signed   By: Kerby Moors M.D.   On: 04/05/2018 17:41   Ct Angio Chest Pe W/cm &/or Wo Cm  Result Date: 04/05/2018 CLINICAL DATA:  Became diaphoretic and pale on the weighted tertius evening, back and chest pain, shortness of breath, belching, thumping feeling, elevated D-dimer, question pulmonary embolism; history hypertension, former smoker EXAM: CT ANGIOGRAPHY CHEST WITH CONTRAST TECHNIQUE: Multidetector CT imaging of the chest was performed using the standard protocol during bolus administration of intravenous contrast. Multiplanar CT image reconstructions and MIPs were obtained to evaluate the vascular anatomy. The patient's IV infiltrated during the injection of contrast following 50 cc of contrast injection. Approximately 15 mL of contrast extravasated into the RIGHT arm. Routine post-extravasation instructions given. CONTRAST:  50 mL ISOVUE-370  IOPAMIDOL (ISOVUE-370) INJECTION 76% IV COMPARISON:  03/08/2017 FINDINGS: Cardiovascular: Ascending thoracic aorta appears dilated, 4.2 cm transverse image 43, approximately 4.0 cm on previous exam. No aortic dissection seen. Atherosclerotic calcifications noted at descending thoracic aorta and scattered coronary arteries. No pericardial effusion. Pulmonary arteries adequately opacified and patent. No evidence of pulmonary embolism. Mediastinum/Nodes: 10 mm short axis prevascular lymph node anterior to the ascending thoracic aorta image 45; this area is substantially obscured on previous exam by cardiac pulsatility artifacts. No additional thoracic adenopathy. Base of cervical region normal appearance. Esophagus unremarkable. Significant eventration of RIGHT diaphragm. Lungs/Pleura: Inferior extent of posterior RIGHT costophrenic angle not imaged. Mild RIGHT basilar subsegmental atelectasis. Remaining lungs clear. No acute infiltrate, pleural effusion or pneumothorax. Upper Abdomen: Dependent gallstones within gallbladder. Remaining visualized upper abdomen unremarkable. Musculoskeletal: No acute osseous findings. Review of the MIP images confirms the above findings. IMPRESSION: No evidence of pulmonary embolism. Nonspecific 10 mm prevascular lymph node, upper normal size. RIGHT basilar subsegmental atelectasis. Cholelithiasis. Scattered coronary arterial calcifications. 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 Recommend annual imaging followup by CTA or MRA. This recommendation follows 2010 ACCF/AHA/AATS/ACR/ASA/SCA/SCAI/SIR/STS/SVM Guidelines for the Diagnosis and Management of Patients with Thoracic Aortic Disease. Circulation. 2010; 121: Y101-B510 Aortic Atherosclerosis (ICD10-I70.0). Aortic aneurysm NOS (ICD10-I71.9). Electronically Signed   By: Lavonia Dana M.D.   On: 04/05/2018 20:44    Procedures Procedures (including critical care time)  Medications  Ordered in ED Medications  oxyCODONE-acetaminophen (PERCOCET/ROXICET) 5-325 MG per tablet 1 tablet (1 tablet Oral Given 04/05/18 1754)  methocarbamol (ROBAXIN) tablet 750 mg (750 mg Oral Given 04/05/18 1754)  iopamidol (ISOVUE-370) 76 % injection 100 mL (100 mLs Intravenous Contrast Given 04/05/18 2003)     Initial Impression / Assessment and Plan / ED Course  I have reviewed the triage vital signs and the nursing notes.  Pertinent labs & imaging results that were available during my care of the patient were reviewed by me and considered in my medical decision making (see chart for details).    Patient presents with pleuritic chest pain radiating from his central chest to his thoracic back.  D-dimer is  significantly elevated.  CT of the chest does not show PE.  EKG without acute interval changes from previous.  Troponin normal.  Patient has had similar pain previously with negative cardiac catheterization last year.  Low suspicion for cardiac ischemic etiology.  Patient will follow-up with PCP and cardiologist.  Return precautions reviewed.  Final Clinical Impressions(s) / ED Diagnoses   Final diagnoses:  Pleuritic chest pain  Calculus of gallbladder without cholecystitis without obstruction    ED Discharge Orders    None       Charlesetta Shanks, MD 04/05/18 2210

## 2018-04-05 NOTE — ED Notes (Signed)
Patient transported to CT 

## 2018-04-05 NOTE — ED Triage Notes (Addendum)
While on the way to church this evening pt became pale and diaphoretic, stated his back and chest hurt. Endorses SOB and belching.  HE describes the pain as a thump feeling that is worse in his back bone.

## 2018-04-05 NOTE — Discharge Instructions (Signed)
1.  Follow-up with your cardiologist and doctor soon as possible. 2.  Follow a bland diet for the gallbladder.  Discussed gallstones with your family doctor for possible referral to a general surgeon. 3.  You may try over-the-counter Gas-X (simethacone) for belching. 4.  Return to the emergency department if you are experiencing worsening or changing symptoms.

## 2018-04-05 NOTE — ED Notes (Signed)
Patient left at this time with all belongings. 

## 2018-04-05 NOTE — ED Notes (Signed)
Patient transported to X-ray 

## 2018-04-05 NOTE — Progress Notes (Signed)
Pt IV extravasated 15 ml Isovue 370 IV during exam. Katie, EMT informed to let Raquel Sarna RN know, Pfeiffer MD aware. IV removed, pt states its does not hurt.  Small hard area at site of extravasation.  Pt informed to voice concerns if pain returns at any time.

## 2018-04-10 ENCOUNTER — Encounter: Payer: Self-pay | Admitting: Cardiology

## 2018-04-10 ENCOUNTER — Ambulatory Visit (INDEPENDENT_AMBULATORY_CARE_PROVIDER_SITE_OTHER): Payer: Medicare Other | Admitting: Cardiology

## 2018-04-10 VITALS — BP 154/100 | HR 80 | Ht 72.0 in | Wt 306.5 lb

## 2018-04-10 DIAGNOSIS — I2510 Atherosclerotic heart disease of native coronary artery without angina pectoris: Secondary | ICD-10-CM | POA: Diagnosis not present

## 2018-04-10 DIAGNOSIS — R091 Pleurisy: Secondary | ICD-10-CM | POA: Diagnosis not present

## 2018-04-10 DIAGNOSIS — I7789 Other specified disorders of arteries and arterioles: Secondary | ICD-10-CM

## 2018-04-10 DIAGNOSIS — I10 Essential (primary) hypertension: Secondary | ICD-10-CM

## 2018-04-10 MED ORDER — TELMISARTAN-HCTZ 40-12.5 MG PO TABS: 1.0000 | ORAL_TABLET | Freq: Every day | ORAL | 3 refills | Status: DC

## 2018-04-10 MED ORDER — CELECOXIB 100 MG PO CAPS: 100.0000 mg | ORAL_CAPSULE | Freq: Two times a day (BID) | ORAL | 0 refills | Status: DC

## 2018-04-10 NOTE — Patient Instructions (Addendum)
Medication Instructions:  Your physician has recommended you make the following change in your medication:   STOP: Lisinopril START: Micardis-HCT 40-12.5mg  one tablet daily  START:  Celebrex 100mg  twice daily for 2 weeks.    If you need a refill on your cardiac medications before your next appointment, please call your pharmacy.   Lab work: You will have lab work today:  Sedimentation Rate, CRP If you have labs (blood work) drawn today and your tests are completely normal, you will receive your results only by: [place] [facility] MyChart Message (if you have MyChart) OR . A paper copy in the mail If you have any lab test that is abnormal or we need to change your treatment, we will call you to review the results.  Testing/Procedures: You had an EKG today  Follow-Up: At [facility], you and your health needs are our priority.  As part of our continuing mission to provide you with exceptional heart care, we have created designated Provider Care Teams.  These Care Teams include your primary Cardiologist (physician) and Advanced Practice Providers (APPs -  Physician Assistants and Nurse Practitioners) who all work together to provide you with the care you need, when you need it. You will need a follow up appointment in 2 weeks.      Pleurisy  Pleurisy, also called pleuritis, is irritation and swelling (inflammation) of the linings of the lungs. The linings of the lungs are called pleura. They cover the outside of the lungs and the inside of the chest wall. There is a small amount of fluid (pleural fluid) between the pleura that allows the lungs to move in and out smoothly when you breathe. Pleurisy causes the pleura to be rough and dry and to rub together when you breathe, which is painful. In some cases, pleurisy can cause pleural fluid to build up between the pleura (pleural effusion). What are the causes? Common causes of this condition include:  A lung infection caused by bacteria or a virus.  A  blood clot that travels to the lung (pulmonary embolism).  Air leaking into the pleural space (pneumothorax).  Lung cancer or a lung tumor.  A chest injury.  Diseases that can cause lung inflammation. These include rheumatoid arthritis, lupus, sickle cell disease, inflammatory bowel disease, and pancreatitis.  Heart or chest surgery.  Lung damage from inhaling asbestos.  A lung reaction to certain medicines. Sometimes the cause is unknown. What are the signs or symptoms? Chest pain is the main symptom of this condition. The pain is usually on one side. Chest pain may start suddenly and be sharp or stabbing. It may become a constant dull ache. You may also feel pain in your back or shoulder. The pain may get worse when you cough, take deep breaths, or make sudden movements. Other symptoms may include:  Shortness of breath.  Noisy breathing (wheezing).  Cough.  Chills.  Fever. How is this diagnosed? This condition may be diagnosed based on:  Your medical history.  Your symptoms.  A physical exam. Your health care provider will listen to your breathing with a stethoscope to check for a rough, rubbing sound (friction rub). If you have pleural effusion, your breathing sounds may be muffled.  Tests, such as: ? Blood tests to check for infections or diseases and to measure the oxygen in your blood. ? Imaging studies of your lungs. These may include a chest X-ray, ultrasound, MRI, or CT scan. ? A procedure to remove pleural fluid with a needle for  testing (thoracentesis). How is this treated? Treatment for this condition depends on the cause. Pleurisy that was caused by a virus usually clears up within 2 weeks. Treatment for pleurisy may include:  NSAIDs to help relieve pain and swelling.  Antibiotic medicines, if your condition was caused by a bacterial infection.  Prescription pain or cough medicine.  Medicines to dissolve a blood clot, if your condition was caused by  pulmonary embolism.  Removal of pleural fluid or air. Follow these instructions at home: Medicines  Take over-the-counter and prescription medicines only as told by your health care provider.  If you were prescribed an antibiotic, take it as told by your health care provider. Do not stop taking the antibiotic even if you start to feel better. Activity  Rest and return to your normal activities as told by your health care provider. Ask your health care provider what activities are safe for you.  Do not drive or use heavy machinery while taking prescription pain medicine. General instructions   Monitor your pleurisy for any changes.  Take deep breaths often, even if it is painful. This can help prevent lung infection (pneumonia) and collapse of lung tissue (atelectasis).  When lying down, lie on your painful side. This may reduce pain.  Do not smoke. If you need help quitting, ask your health care provider.  Keep all follow-up visits as told by your health care provider. This is important. Contact a health care provider if:  You have pain that: ? Gets worse. ? Does not get better with medicine. ? Lasts for more than 1 week.  You have a fever or chills.  Your cough or shortness of breath is not improving at home.  You cough up pus-like (purulent) secretions. Get help right away if:  Your lips, fingernails, or toenails darken or turn blue.  You cough up blood.  You have any of the following symptoms that get worse: ? Difficulty breathing. ? Shortness of breath. ? Wheezing.  You have pain that spreads into your neck, arms, or jaw.  You develop a rash.  You vomit.  You faint. Summary  Pleurisy is inflammation of the linings of the lungs (pleura).  Pleurisy causes pain that makes it difficult for you to breathe or cough.  Pleurisy is often caused by an underlying infection or disease.  Treatment of pleurisy depends on the cause, and it often includes  medicines. This information is not intended to replace advice given to you by your health care provider. Make sure you discuss any questions you have with your health care provider. Document Released: 03/19/2005 Document Revised: 12/12/2015 Document Reviewed: 12/12/2015 Elsevier Interactive Patient Education  [employer].

## 2018-04-10 NOTE — Progress Notes (Signed)
Cardiology Office Note:    Date:  04/10/2018   ID:  Todd Chan, DOB 23-May-1945, MRN [id number]  PCP:  Lyman Bishop, DO  Cardiologist:  Shirlee More, MD    Referring MD: Orpah Melter, MD    ASSESSMENT:    1. Essential hypertension   2. Pleurisy   3. Enlarged thoracic aorta (HCC)    PLAN:    In order of problems listed above:  1. Poorly controlled discontinue ACE inhibitor I will place him on a high potency ARB thiazide diuretic will plan to check renal function office follow-up 2 weeks 2. Utilizing the data of the emergency room and his heart catheterization he has idiopathic pleuritis check sedimentation rate and CRP for confirmation start a nonsteroidal anti-inflammatory drug and follow-up in 2 weeks.  We will recheck an EKG today in the office. 3. Stable on serial CT scans a few years blood pressure control and plan a repeat in 6 months to 1 year 4. Mild coronary atherosclerosis is stable his symptoms are not anginal in nature troponin was assessed in the emergency room we will recheck an EKG prior to leaving the office   Next appointment: 2 weeks   Medication Adjustments/Labs and Tests Ordered: Current medicines are reviewed at length with the patient today.  Concerns regarding medicines are outlined above.  No orders of the defined types were placed in this encounter.  No orders of the defined types were placed in this encounter.   Chief Complaint  Patient presents with  . Follow-up  . Chest Pain    pleurisy    History of Present Illness:    [name] is a 73 y.o. male with a hx of mild coronary atherosclerosis  last seen 04/25/17.  He was seen in the emergency room 04/05/2018 with back and chest pain d-dimer is elevated underwent CTA of the chest that showed no evidence of aortic injury or pulmonary embolism EKG without ischemic changes and troponin was normal.  This is felt to be noncardiac nonanginal is advised to follow-up with both his  family doctor and cardiology. Compliance with diet, lifestyle and medications: Yes but he does add salt to his food  Is seen in the emergency room with complaint of chest and back pain it is not particularly sharp in nature but it clearly is pleuritic and radiates through from the left sternal border through to his scapula and the left shoulder.  It waxes and wanes it comes and goes and is not positional.  He has had a cough but no fever or chills and prior to the onset of this he had been taking a nonsteroidal anti-inflammatory drug in a regular basis is unrelated to physical effort and some relieved with rest.  The symptoms have persisted since the ED visit.  He did have an elevated d-dimer and at times it can be a nonspecific marker of inflammation.  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  Will check labs including C-reactive protein and sedimentation rate for confirmation placement of prescription strength nonsteroidal anti-inflammatory drug and see back in 2 weeks.  His blood pressure is poorly controlled recheck by me 168/92 and I will switch him from a low-dose of an ACE to high potency ARB plus thiazide diuretic and recheck renal function at his office visit in 2 weeks.  I reviewed his CT report he has mild enlargement of the thoracic aorta it has not significantly progressed from previous  CT scan and no evidence of acute aortic injury.  Chest CTA: IMPRESSION: No evidence of pulmonary embolism. Nonspecific 10 mm prevascular lymph node, upper normal size. RIGHT basilar subsegmental atelectasis. Cholelithiasis. Scattered coronary arterial calcifications. 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   Ref Range & Units 5d ago (04/05/18) 83yr ago (03/08/17) 33yr ago (03/08/17)  Troponin I <0.03 ng/mL <0.03  <0.03  <0.03      Past Medical History:  Diagnosis Date  . Arthritis     . GERD (gastroesophageal reflux disease)   . Hypertension   . Osteoarthritis of knee   . Painful urination   . Peripheral vascular disease ([place])    bilateral edema     Past Surgical History:  Procedure Laterality Date  . CARDIAC CATHETERIZATION  04/2017  . ESOPHAGOGASTRODUODENOSCOPY ENDOSCOPY     8 days ago  . KNEE ARTHROSCOPY Left 6-7 years ago  . LEFT HEART CATH AND CORONARY ANGIOGRAPHY N/A 04/17/2017   Procedure: LEFT HEART CATH AND CORONARY ANGIOGRAPHY;  Surgeon: Troy Sine, MD;  Location: [facility];  Service: Cardiovascular;  Laterality: N/A;  . 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 TONSILLECTOMY    . TOTAL KNEE ARTHROPLASTY Left 10/08/2016   Procedure: LEFT TOTAL KNEE ARTHROPLASTY;  Surgeon: Gaynelle Arabian, MD;  Location: [facility];  Service: Orthopedics;  Laterality: Left;  . TOTAL KNEE ARTHROPLASTY Right 06/17/2017   Procedure: RIGHT TOTAL KNEE ARTHROPLASTY;  Surgeon: Gaynelle Arabian, MD;  Location: [facility];  Service: Orthopedics;  Laterality: Right;    Current Medications: Current Meds  Medication Sig  . Cholecalciferol (VITAMIN D3) 50 MCG (2000 UT) capsule Take 2,000 Units by mouth daily.  . metoprolol tartrate (LOPRESSOR) 25 MG tablet Take 1 tablet (25 mg total) by mouth 2 (two) times daily.  . Multiple Vitamin (MULTIVITAMIN) capsule Take 1 capsule by mouth daily.  . Omega-3 Fatty Acids (FISH OIL) 1000 MG CAPS Take 1,000 mg by mouth daily.  . pantoprazole (PROTONIX) 40 MG tablet Take 1 tablet (40 mg total) by mouth daily. (Patient taking differently: Take 40 mg by mouth 2 (two) times daily. )  . [DISCONTINUED] lisinopril (PRINIVIL,ZESTRIL) 10 MG tablet Take 10 mg by mouth daily.     Allergies:   Patient has no known allergies.   Social History   Socioeconomic History  . Marital status: Married    Spouse name: Not on file  . Number of children: 2  . Years of education: Not on file  . Highest education level: Bachelor's degree (e.g., BA, AB, BS)   Occupational History  . Not on file  Social Needs  . Financial resource strain: Not on file  . Food insecurity:    Worry: Not on file    Inability: Not on file  . Transportation needs:    Medical: Not on file    Non-medical: Not on file  Tobacco Use  . Smoking status: Former Smoker    Packs/day: 0.50    Last attempt to quit: 10/1987    Years since quitting: 30.5  . Smokeless tobacco: Never Used  . Tobacco comment: quit 30 yeara ago   Substance and Sexual Activity  . Alcohol use: Yes    Comment: 3 drinks per day- wine and scotch  . Drug use: No  . Sexual activity: Not on file  Lifestyle  . Physical activity:    Days per week: Not on file    Minutes per session: Not on file  .  Stress: Not on file  Relationships  . Social connections:    Talks on phone: Not on file    Gets together: Not on file    Attends religious service: Not on file    Active member of club or organization: Not on file    Attends meetings of clubs or organizations: Not on file    Relationship status: Not on file  Other Topics Concern  . Not on file  Social History Narrative   Lives at home with his wife   Left handed   Drinks 2-3 cups of caffeine daily     Family History: The patient's family history includes Breast cancer in his sister; Prostate cancer in his father. There is no history of Neuropathy. ROS:   Please see the history of present illness.    All other systems reviewed and are negative.  EKGs/Labs/Other Studies Reviewed:    The following studies were reviewed today:  EKG:  EKG ordered today.  The ekg ordered today demonstrates sinus rhythm and remains normal no findings of pericarditis or ischemia  Recent Labs: 06/13/2017: ALT 18 04/05/2018: BUN 20; Creatinine, Ser 0.88; Hemoglobin 15.5; Platelets 153; Potassium 4.0; Sodium 137  Recent Lipid Panel No results found for: CHOL, TRIG, HDL, CHOLHDL, VLDL, LDLCALC, LDLDIRECT  Physical Exam:    VS:  BP (!) 154/100 (BP Location: Right  Arm, Patient Position: Sitting, Cuff Size: Large)   Pulse 80   Ht 6' (1.829 m)   Wt (!) 306 lb 8 oz (139 kg)   SpO2 94%   BMI 41.57 kg/m     Wt Readings from Last 3 Encounters:  04/10/18 (!) 306 lb 8 oz (139 kg)  06/18/17 284 lb (128.8 kg)  06/13/17 284 lb 9.6 oz (129.1 kg)     GEN:  Well nourished, well developed in no acute distress HEENT: Normal NECK: No JVD; No carotid bruits LYMPHATICS: No lymphadenopathy CARDIAC: RRR, no murmurs, rubs, gallops RESPIRATORY:  Clear to auscultation without rales, wheezing or rhonchi  ABDOMEN: Soft, non-tender, non-distended MUSCULOSKELETAL:  No edema; No deformity  SKIN: Warm and dry NEUROLOGIC:  Alert and oriented x 3 PSYCHIATRIC:  Normal affect    Signed, Shirlee More, MD  04/10/2018 11:10 AM    [place]

## 2018-04-11 LAB — SEDIMENTATION RATE: Sed Rate: 17 mm/hr (ref 0–30)

## 2018-04-11 LAB — C-REACTIVE PROTEIN: CRP: 15 mg/L — ABNORMAL HIGH (ref 0–10)

## 2018-04-14 DIAGNOSIS — M5033 Other cervical disc degeneration, cervicothoracic region: Secondary | ICD-10-CM | POA: Diagnosis not present

## 2018-04-14 DIAGNOSIS — M5134 Other intervertebral disc degeneration, thoracic region: Secondary | ICD-10-CM | POA: Diagnosis not present

## 2018-04-14 DIAGNOSIS — M9901 Segmental and somatic dysfunction of cervical region: Secondary | ICD-10-CM | POA: Diagnosis not present

## 2018-04-14 DIAGNOSIS — M9902 Segmental and somatic dysfunction of thoracic region: Secondary | ICD-10-CM | POA: Diagnosis not present

## 2018-04-17 DIAGNOSIS — M9902 Segmental and somatic dysfunction of thoracic region: Secondary | ICD-10-CM | POA: Diagnosis not present

## 2018-04-17 DIAGNOSIS — M5134 Other intervertebral disc degeneration, thoracic region: Secondary | ICD-10-CM | POA: Diagnosis not present

## 2018-04-17 DIAGNOSIS — M9901 Segmental and somatic dysfunction of cervical region: Secondary | ICD-10-CM | POA: Diagnosis not present

## 2018-04-17 DIAGNOSIS — M5033 Other cervical disc degeneration, cervicothoracic region: Secondary | ICD-10-CM | POA: Diagnosis not present

## 2018-04-22 DIAGNOSIS — M5033 Other cervical disc degeneration, cervicothoracic region: Secondary | ICD-10-CM | POA: Diagnosis not present

## 2018-04-22 DIAGNOSIS — M9902 Segmental and somatic dysfunction of thoracic region: Secondary | ICD-10-CM | POA: Diagnosis not present

## 2018-04-22 DIAGNOSIS — M9901 Segmental and somatic dysfunction of cervical region: Secondary | ICD-10-CM | POA: Diagnosis not present

## 2018-04-22 DIAGNOSIS — M5134 Other intervertebral disc degeneration, thoracic region: Secondary | ICD-10-CM | POA: Diagnosis not present

## 2018-04-25 DIAGNOSIS — M5134 Other intervertebral disc degeneration, thoracic region: Secondary | ICD-10-CM | POA: Diagnosis not present

## 2018-04-25 DIAGNOSIS — M9902 Segmental and somatic dysfunction of thoracic region: Secondary | ICD-10-CM | POA: Diagnosis not present

## 2018-04-25 DIAGNOSIS — M5033 Other cervical disc degeneration, cervicothoracic region: Secondary | ICD-10-CM | POA: Diagnosis not present

## 2018-04-25 DIAGNOSIS — M9901 Segmental and somatic dysfunction of cervical region: Secondary | ICD-10-CM | POA: Diagnosis not present

## 2018-04-29 DIAGNOSIS — M9901 Segmental and somatic dysfunction of cervical region: Secondary | ICD-10-CM | POA: Diagnosis not present

## 2018-04-29 DIAGNOSIS — M5033 Other cervical disc degeneration, cervicothoracic region: Secondary | ICD-10-CM | POA: Diagnosis not present

## 2018-04-29 DIAGNOSIS — M9902 Segmental and somatic dysfunction of thoracic region: Secondary | ICD-10-CM | POA: Diagnosis not present

## 2018-04-29 DIAGNOSIS — M5134 Other intervertebral disc degeneration, thoracic region: Secondary | ICD-10-CM | POA: Diagnosis not present

## 2018-05-02 DIAGNOSIS — M9902 Segmental and somatic dysfunction of thoracic region: Secondary | ICD-10-CM | POA: Diagnosis not present

## 2018-05-02 DIAGNOSIS — M9901 Segmental and somatic dysfunction of cervical region: Secondary | ICD-10-CM | POA: Diagnosis not present

## 2018-05-02 DIAGNOSIS — M5134 Other intervertebral disc degeneration, thoracic region: Secondary | ICD-10-CM | POA: Diagnosis not present

## 2018-05-02 DIAGNOSIS — M5033 Other cervical disc degeneration, cervicothoracic region: Secondary | ICD-10-CM | POA: Diagnosis not present

## 2018-05-07 DIAGNOSIS — M5134 Other intervertebral disc degeneration, thoracic region: Secondary | ICD-10-CM | POA: Diagnosis not present

## 2018-05-07 DIAGNOSIS — M9902 Segmental and somatic dysfunction of thoracic region: Secondary | ICD-10-CM | POA: Diagnosis not present

## 2018-05-07 DIAGNOSIS — M5033 Other cervical disc degeneration, cervicothoracic region: Secondary | ICD-10-CM | POA: Diagnosis not present

## 2018-05-07 DIAGNOSIS — M9901 Segmental and somatic dysfunction of cervical region: Secondary | ICD-10-CM | POA: Diagnosis not present

## 2018-05-12 DIAGNOSIS — I10 Essential (primary) hypertension: Secondary | ICD-10-CM | POA: Diagnosis not present

## 2018-05-12 DIAGNOSIS — I712 Thoracic aortic aneurysm, without rupture: Secondary | ICD-10-CM | POA: Diagnosis not present

## 2018-05-12 DIAGNOSIS — Z0000 Encounter for general adult medical examination without abnormal findings: Secondary | ICD-10-CM | POA: Diagnosis not present

## 2018-05-12 NOTE — Progress Notes (Signed)
Cardiology Office Note:    Date:  05/13/2018   ID:  Todd Chan, DOB [birth year], MRN [id number]  PCP:  Lyman Bishop, DO  Cardiologist:  Shirlee More, MD    Referring MD: Lyman Bishop, DO    ASSESSMENT:    1. Essential hypertension   2. Other hyperlipidemia   3. Shortness of breath   4. Mild CAD    PLAN:    In order of problems listed above:  1. Stable blood pressure target continue current treatment ARB diuretic beta-blocker 2. Stable continue statin has been 1 year check lipid profile CMP today 3. Referral to pulmonary I do not think his symptoms of shortness of breath are cardiac in etiology 4. Stable having no angina continue current medical treatment   Next appointment: 6 months   Medication Adjustments/Labs and Tests Ordered: Current medicines are reviewed at length with the patient today.  Concerns regarding medicines are outlined above.  Orders Placed This Encounter  Procedures  . Comprehensive Metabolic Panel (CMET)  . Lipid Profile  . Ambulatory referral to Pulmonology   Meds ordered this encounter  Medications  . telmisartan-hydrochlorothiazide (MICARDIS HCT) 40-12.5 MG tablet    Sig: Take 1 tablet by mouth daily.    Dispense:  90 tablet    Refill:  2  . metoprolol tartrate (LOPRESSOR) 25 MG tablet    Sig: Take 1 tablet (25 mg total) by mouth 2 (two) times daily.    Dispense:  180 tablet    Refill:  2    Please refill    Chief Complaint  Patient presents with  . Coronary Artery Disease    History of Present Illness:    Todd Chan is a 73 y.o. male with a hx of mild CAD and pleuritc chest pain last seen 04/10/18.He was seen in the emergency room 04/05/2018 with back and chest pain d-dimer is elevated underwent CTA of the chest that showed no evidence of aortic injury or pulmonary embolism EKG without ischemic changes and troponin was normal.  This is felt to be noncardiac nonanginal is advised to follow-up with both his family  doctor and cardiology. Compliance with diet, lifestyle and medications: yes  He is frustrated he can walk a mile every day without difficulty but finds himself intermittently short of breath at times even with speaking.  No cough or wheezing no underlying lung disease.  His primary care physician discussed pulmonary referral and I am going to do that for him as a suspect he has underlying lung disease and needs high resolution CT of the chest and/or pulmonary function.  No edema orthopnea chest pain palpitation or syncope Past Medical History:  Diagnosis Date  . Arthritis   . GERD (gastroesophageal reflux disease)   . Hypertension   . Osteoarthritis of knee   . Painful urination   . Peripheral vascular disease ([place])    bilateral edema     Past Surgical History:  Procedure Laterality Date  . CARDIAC CATHETERIZATION  04/2017  . ESOPHAGOGASTRODUODENOSCOPY ENDOSCOPY     8 days ago  . KNEE ARTHROSCOPY Left 6-7 years ago  . LEFT HEART CATH AND CORONARY ANGIOGRAPHY N/A 04/17/2017   Procedure: LEFT HEART CATH AND CORONARY ANGIOGRAPHY;  Surgeon: Troy Sine, MD;  Location: [facility] [facility];  Service: Cardiovascular;  Laterality: N/A;  . 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 TONSILLECTOMY    . TOTAL KNEE ARTHROPLASTY Left 10/08/2016   Procedure: LEFT TOTAL KNEE ARTHROPLASTY;  Surgeon:  Gaynelle Arabian, MD;  Location: [facility];  Service: Orthopedics;  Laterality: Left;  . TOTAL KNEE ARTHROPLASTY Right 06/17/2017   Procedure: RIGHT TOTAL KNEE ARTHROPLASTY;  Surgeon: Gaynelle Arabian, MD;  Location: [facility];  Service: Orthopedics;  Laterality: Right;    Current Medications: Current Meds  Medication Sig  . aspirin EC 81 MG tablet Take 81 mg by mouth daily.  . Cholecalciferol (VITAMIN D3) 50 MCG (2000 UT) capsule Take 2,000 Units by mouth daily.  . metoprolol tartrate (LOPRESSOR) 25 MG tablet Take 1 tablet (25 mg total) by mouth 2 (two) times daily.  . Multiple Vitamin (MULTIVITAMIN) capsule  Take 1 capsule by mouth daily.  . Omega-3 Fatty Acids (FISH OIL) 1000 MG CAPS Take 1,000 mg by mouth daily.  . pantoprazole (PROTONIX) 40 MG tablet Take 1 tablet (40 mg total) by mouth daily.  Marland Kitchen telmisartan-hydrochlorothiazide (MICARDIS HCT) 40-12.5 MG tablet Take 1 tablet by mouth daily.  . [DISCONTINUED] metoprolol tartrate (LOPRESSOR) 25 MG tablet Take 1 tablet (25 mg total) by mouth 2 (two) times daily.  . [DISCONTINUED] telmisartan-hydrochlorothiazide (MICARDIS HCT) 40-12.5 MG tablet Take 1 tablet by mouth daily.     Allergies:   Patient has no known allergies.   Social History   Socioeconomic History  . Marital status: Married    Spouse name: Not on file  . Number of children: 2  . Years of education: Not on file  . Highest education level: Bachelor's degree (e.g., BA, AB, BS)  Occupational History  . Not on file  Social Needs  . Financial resource strain: Not on file  . Food insecurity:    Worry: Not on file    Inability: Not on file  . Transportation needs:    Medical: Not on file    Non-medical: Not on file  Tobacco Use  . Smoking status: Former Smoker    Packs/day: 0.50    Last attempt to quit: 10/1987    Years since quitting: 30.6  . Smokeless tobacco: Never Used  . Tobacco comment: quit 30 yeara ago   Substance and Sexual Activity  . Alcohol use: Yes    Comment: 3 drinks per day- wine and scotch  . Drug use: No  . Sexual activity: Not on file  Lifestyle  . Physical activity:    Days per week: Not on file    Minutes per session: Not on file  . Stress: Not on file  Relationships  . Social connections:    Talks on phone: Not on file    Gets together: Not on file    Attends religious service: Not on file    Active member of club or organization: Not on file    Attends meetings of clubs or organizations: Not on file    Relationship status: Not on file  Other Topics Concern  . Not on file  Social History Narrative   Lives at home with his wife   Left  handed   Drinks 2-3 cups of caffeine daily     Family History: The patient's family history includes Breast cancer in his sister; Prostate cancer in his father. There is no history of Neuropathy. ROS:   Please see the history of present illness.    All other systems reviewed and are negative.  EKGs/Labs/Other Studies Reviewed:    The following studies were reviewed today:  Recent Labs: 06/13/2017: ALT 18 04/05/2018: BUN 20; Creatinine, Ser 0.88; Hemoglobin 15.5; Platelets 153; Potassium 4.0; Sodium 137  Recent Lipid Panel No results  found for: CHOL, TRIG, HDL, CHOLHDL, VLDL, LDLCALC, LDLDIRECT  Physical Exam:    VS:  BP 118/84 (BP Location: Right Arm, Patient Position: Sitting, Cuff Size: Large)   Pulse 77   Ht 6' (1.829 m)   Wt 299 lb 12.8 oz (136 kg)   SpO2 92%   BMI 40.66 kg/m     Wt Readings from Last 3 Encounters:  05/13/18 299 lb 12.8 oz (136 kg)  04/10/18 (!) 306 lb 8 oz (139 kg)  06/18/17 284 lb (128.8 kg)     GEN:  Well nourished, well developed in no acute distress HEENT: Normal NECK: No JVD; No carotid bruits LYMPHATICS: No lymphadenopathy CARDIAC: RRR, no murmurs, rubs, gallops RESPIRATORY:  Clear to auscultation without rales, wheezing or rhonchi  ABDOMEN: Soft, non-tender, non-distended MUSCULOSKELETAL:  No edema; No deformity  SKIN: Warm and dry NEUROLOGIC:  Alert and oriented x 3 PSYCHIATRIC:  Normal affect    Signed, Shirlee More, MD  05/13/2018 12:23 PM    [facility]

## 2018-05-13 ENCOUNTER — Ambulatory Visit (INDEPENDENT_AMBULATORY_CARE_PROVIDER_SITE_OTHER): Payer: Medicare Other | Admitting: Cardiology

## 2018-05-13 ENCOUNTER — Encounter: Payer: Self-pay | Admitting: Cardiology

## 2018-05-13 VITALS — BP 118/84 | HR 77 | Ht 72.0 in | Wt 299.8 lb

## 2018-05-13 DIAGNOSIS — I2510 Atherosclerotic heart disease of native coronary artery without angina pectoris: Secondary | ICD-10-CM

## 2018-05-13 DIAGNOSIS — R0602 Shortness of breath: Secondary | ICD-10-CM

## 2018-05-13 DIAGNOSIS — E7849 Other hyperlipidemia: Secondary | ICD-10-CM | POA: Diagnosis not present

## 2018-05-13 DIAGNOSIS — I10 Essential (primary) hypertension: Secondary | ICD-10-CM | POA: Diagnosis not present

## 2018-05-13 MED ORDER — METOPROLOL TARTRATE 25 MG PO TABS: 25.0000 mg | ORAL_TABLET | Freq: Two times a day (BID) | ORAL | 2 refills | Status: DC

## 2018-05-13 MED ORDER — TELMISARTAN-HCTZ 40-12.5 MG PO TABS
1.0000 | ORAL_TABLET | Freq: Every day | ORAL | 2 refills | Status: DC
Start: 2018-05-13 — End: 2019-01-27

## 2018-05-13 NOTE — Patient Instructions (Signed)
Medication Instructions:  Your physician recommends that you continue on your current medications as directed. Please refer to the Current Medication list given to you today.  If you need a refill on your cardiac medications before your next appointment, please call your pharmacy.   Lab work: Your physician recommends that you return for lab work today: Lipid panel and CMP  If you have labs (blood work) drawn today and your tests are completely normal, you will receive your results only by:  [place] (if you have MyChart) OR  A paper copy in the mail If you have any lab test that is abnormal or we need to change your treatment, we will call you to review the results.  Testing/Procedures: Patient referred to Pulmonology for issues with shortness of breath. You will be contacted to schedule this appointment.   Follow-Up: At [facility], you and your health needs are our priority.  As part of our continuing mission to provide you with exceptional heart care, we have created designated Provider Care Teams.  These Care Teams include your primary Cardiologist (physician) and Advanced Practice Providers (APPs -  Physician Assistants and Nurse Practitioners) who all work together to provide you with the care you need, when you need it. You will need a follow up appointment in 6 months.  Please call our office 2 months in advance to schedule this appointment.  You may see No primary care provider on file.

## 2018-05-14 DIAGNOSIS — M5033 Other cervical disc degeneration, cervicothoracic region: Secondary | ICD-10-CM | POA: Diagnosis not present

## 2018-05-14 DIAGNOSIS — M5134 Other intervertebral disc degeneration, thoracic region: Secondary | ICD-10-CM | POA: Diagnosis not present

## 2018-05-14 DIAGNOSIS — M9901 Segmental and somatic dysfunction of cervical region: Secondary | ICD-10-CM | POA: Diagnosis not present

## 2018-05-14 DIAGNOSIS — M9902 Segmental and somatic dysfunction of thoracic region: Secondary | ICD-10-CM | POA: Diagnosis not present

## 2018-05-14 LAB — LIPID PANEL
CHOL/HDL RATIO: 3.1 ratio (ref 0.0–5.0)
Cholesterol, Total: 174 mg/dL (ref 100–199)
HDL: 56 mg/dL (ref >39)
LDL CALC: 106 mg/dL — AB (ref 0–99)
Triglycerides: 60 mg/dL (ref 0–149)
VLDL Cholesterol Cal: 12 mg/dL (ref 5–40)

## 2018-05-14 LAB — COMPREHENSIVE METABOLIC PANEL
ALBUMIN: 4.4 g/dL (ref 3.8–4.8)
ALT: 22 IU/L (ref 0–44)
AST: 26 IU/L (ref 0–40)
Albumin/Globulin Ratio: 2 (ref 1.2–2.2)
Alkaline Phosphatase: 46 IU/L (ref 39–117)
BILIRUBIN TOTAL: 1.4 mg/dL — AB (ref 0.0–1.2)
BUN / CREAT RATIO: 17 (ref 10–24)
BUN: 17 mg/dL (ref 8–27)
CALCIUM: 9.3 mg/dL (ref 8.6–10.2)
CHLORIDE: 100 mmol/L (ref 96–106)
CO2: 27 mmol/L (ref 20–29)
CREATININE: 1.02 mg/dL (ref 0.76–1.27)
GFR, EST AFRICAN AMERICAN: 86 mL/min/{1.73_m2} (ref >59)
GFR, EST NON AFRICAN AMERICAN: 74 mL/min/{1.73_m2} (ref >59)
GLUCOSE: 93 mg/dL (ref 65–99)
Globulin, Total: 2.2 g/dL (ref 1.5–4.5)
Potassium: 4.5 mmol/L (ref 3.5–5.2)
Sodium: 141 mmol/L (ref 134–144)
TOTAL PROTEIN: 6.6 g/dL (ref 6.0–8.5)

## 2018-05-15 ENCOUNTER — Telehealth: Payer: Self-pay | Admitting: *Deleted

## 2018-05-15 DIAGNOSIS — E7849 Other hyperlipidemia: Secondary | ICD-10-CM

## 2018-05-15 MED ORDER — PRAVASTATIN SODIUM 20 MG PO TABS: 20.0000 mg | ORAL_TABLET | Freq: Every evening | ORAL | 1 refills | Status: DC

## 2018-05-15 NOTE — Telephone Encounter (Signed)
Patient informed of lab results and advised that Dr. Bettina Gavia recommends starting pravastatin 20 mg daily. Patient is agreeable and prescription has been sent to [facility] as requested. Patient will return to follow up lab work in 2 months, no appointment needed. No need to fast beforehand. Patient verbalized understanding. No further questions.

## 2018-05-15 NOTE — Telephone Encounter (Signed)
-----   Message from Richardo Priest, MD sent at 05/14/2018  8:14 AM EST ----- Needs a satin pravastatin 20 mg #30 refill 6 one daily recheck lipids 2 months

## 2018-05-21 DIAGNOSIS — M5134 Other intervertebral disc degeneration, thoracic region: Secondary | ICD-10-CM | POA: Diagnosis not present

## 2018-05-21 DIAGNOSIS — M9901 Segmental and somatic dysfunction of cervical region: Secondary | ICD-10-CM | POA: Diagnosis not present

## 2018-05-21 DIAGNOSIS — M5033 Other cervical disc degeneration, cervicothoracic region: Secondary | ICD-10-CM | POA: Diagnosis not present

## 2018-05-21 DIAGNOSIS — M9902 Segmental and somatic dysfunction of thoracic region: Secondary | ICD-10-CM | POA: Diagnosis not present

## 2018-05-30 ENCOUNTER — Encounter: Payer: Self-pay | Admitting: Pulmonary Disease

## 2018-05-30 ENCOUNTER — Ambulatory Visit (INDEPENDENT_AMBULATORY_CARE_PROVIDER_SITE_OTHER): Payer: Medicare Other | Admitting: Pulmonary Disease

## 2018-05-30 VITALS — BP 140/88 | HR 79 | Ht 72.0 in | Wt 302.8 lb

## 2018-05-30 DIAGNOSIS — R0602 Shortness of breath: Secondary | ICD-10-CM | POA: Diagnosis not present

## 2018-05-30 DIAGNOSIS — G4733 Obstructive sleep apnea (adult) (pediatric): Secondary | ICD-10-CM

## 2018-05-30 MED ORDER — PANTOPRAZOLE SODIUM 40 MG PO TBEC: 40.0000 mg | DELAYED_RELEASE_TABLET | Freq: Two times a day (BID) | ORAL | 0 refills | Status: DC

## 2018-05-30 NOTE — Progress Notes (Signed)
Subjective:   PATIENT ID: Todd Chan GENDER: male DOB: [birth year], MRN: [id number]   HPI  Chief Complaint  Patient presents with  . Consult    Consult for SOB. States he developed SOB 6-8 months ago. States he has increased belching. Reports intermittent chest tightness. States he did have a cough but has since resolved. States the cough was dry and his wife states he coughs alot after eating.States in ED he was told he has decreased lung capacity.     Reason for Visit: New patient; SOB   Todd Chan is a 73 year old male with PVD, CAD and HTN who presents as new patient for shortness of breath.  He reports that he was recently seen for shortness of breath and treated for pleurisy however he began having SOB x 6-8 months ago after knee surgery and this has remained unchanged in the last 3 months. Associated with chest tightness especially with deep breaths. Associated with nocturnal wheezing. He reports frequent belching which improves his symptoms. Able to walk one mile without issue. Feels his breathing improves with chin tuck maneuvers and with sleeping elevated with one pillow. After treatment for pleurisy, he has had no change. He continues to walks casual 1 mile five days a week.   Has daily coughing spells in the morning but none the remainder of the day. Hx seasonal allergies. Will have cough worsened post-large meals. Reports post-nasal drip associated with hoarseness.  Has gained 20 lbs in the last year. Sleeps ok. Walks up 2-3 times for nocturia. No nocturnal dyspnea. Snores.  Reports witnessed apnea.   Social History: Quit in 1989.  Environmental exposures: None  I have personally reviewed patient's past medical/family/social history, allergies, current medications.  Past Medical History:  Diagnosis Date  . Arthritis   . GERD (gastroesophageal reflux disease)   . Hypertension   . Osteoarthritis of knee   . Painful urination   . Peripheral vascular  disease ([place])    bilateral edema      Family History  Problem Relation Age of Onset  . Prostate cancer Father   . Breast cancer Sister   . Neuropathy Neg Hx      Social History   Occupational History  . Not on file  Tobacco Use  . Smoking status: Former Smoker    Packs/day: 1.00    Years: 20.00    Pack years: 20.00    Last attempt to quit: 10/1987    Years since quitting: 30.7  . Smokeless tobacco: Never Used  . Tobacco comment: quit 30 yeara ago   Substance and Sexual Activity  . Alcohol use: Yes    Comment: 3 drinks per day- wine and scotch  . Drug use: No  . Sexual activity: Not on file    No Known Allergies   Outpatient Medications Prior to Visit  Medication Sig Dispense Refill  . aspirin EC 81 MG tablet Take 81 mg by mouth daily.    . Cholecalciferol (VITAMIN D3) 50 MCG (2000 UT) capsule Take 2,000 Units by mouth daily.    . metoprolol tartrate (LOPRESSOR) 25 MG tablet Take 1 tablet (25 mg total) by mouth 2 (two) times daily. 180 tablet 2  . Multiple Vitamin (MULTIVITAMIN) capsule Take 1 capsule by mouth daily.    . Omega-3 Fatty Acids (FISH OIL) 1000 MG CAPS Take 1,000 mg by mouth daily.    . pantoprazole (PROTONIX) 40 MG tablet Take 1 tablet (40 mg total) by mouth daily.  30 tablet 11  . pravastatin (PRAVACHOL) 20 MG tablet Take 1 tablet (20 mg total) by mouth every evening. 30 tablet 1  . telmisartan-hydrochlorothiazide (MICARDIS HCT) 40-12.5 MG tablet Take 1 tablet by mouth daily. 90 tablet 2   No facility-administered medications prior to visit.     Review of Systems  Constitutional: Negative for chills, diaphoresis, fever, malaise/fatigue and weight loss.  HENT: Positive for congestion. Negative for ear pain and sore throat.   Respiratory: Positive for cough, shortness of breath and wheezing. Negative for hemoptysis and sputum production.   Cardiovascular: Negative for chest pain, palpitations and leg swelling.  Gastrointestinal: Positive for heartburn.  Negative for abdominal pain and nausea.  Genitourinary: Positive for frequency.  Musculoskeletal: Negative for joint pain and myalgias.  Skin: Negative for itching and rash.  Neurological: Negative for dizziness, weakness and headaches.  Endo/Heme/Allergies: Does not bruise/bleed easily.  Psychiatric/Behavioral: Negative for depression. The patient is not nervous/anxious.      Objective:   Vitals:   05/30/18 1059  BP: 140/88  Pulse: 79  SpO2: 98%  Weight: (!) 302 lb 12.8 oz (137.3 kg)  Height: 6' (1.829 m)   SpO2: 98 %  Physical Exam: General: Well-appearing, no acute distress HENT: [place], AT, OP clear, MMM Eyes: EOMI, no scleral icterus Respiratory: Clear to auscultation bilaterally.  No crackles, wheezing or rales Cardiovascular: RRR, -M/R/G, no JVD GI: BS+, soft, nontender Extremities:-Edema,-tenderness Neuro: AAO x4, CNII-XII grossly intact Skin: Intact, no rashes or bruising Psych: Normal mood, normal affect  Data Reviewed:  Imaging: CTA 04/05/18 - No PE, right basilar atelectasis  PFT: None on file  Labs: CMP 05/13/18 reviewed. Overall normal.  Imaging, labs and tests noted above have been reviewed independently by me.    Assessment & Plan:   Discussion: 73 year old male with 8 month history of shortness of breath associated with nocturnal wheezing. Also has hx of cough and belching. Reports snoring with witnessed apnea. Unclear if his symptoms are related. We reviewed CT scan together and discussed findings of atelectasis. Addressed questions from patient. Will assess with pulmonary function test to rule out underlying lung disease. Will also cocomitantly treat for potentially reflux induced symptoms with PPI and diet.  Shortness of breath Pulmonary function test prior to next visit Management as below  Snoring Split night study  Cough Start Protonix 40 mg twice a day GERD diet   Health Maintenance Pneumonia Due at next visit Influenza 01/27/18 CT  Lung Screen - DNQ  Orders Placed This Encounter  Procedures  . Pulmonary function test    Standing Status:   Future    Standing Expiration Date:   05/31/2019    Order Specific Question:   Where should this test be performed?    Answer:   [facility]    Order Specific Question:   Full PFT: includes the following: basic spirometry, spirometry pre & post bronchodilator, diffusion capacity (DLCO), lung volumes    Answer:   Full PFT  . Split night study    Standing Status:   Future    Standing Expiration Date:   05/31/2019    Order Specific Question:   Where should this test be performed:    Answer:   [facility] ordered this encounter  Medications  . pantoprazole (PROTONIX) 40 MG tablet    Sig: Take 1 tablet (40 mg total) by mouth 2 (two) times daily.    Dispense:  120 tablet    Refill:  0    Return in about 3 months (around 08/28/2018).   Greater than 50% of this patient 45-minute office visit was spent face-to-face in counseling with the patient/family. We discussed medical diagnosis and treatment plan as noted.   [place], MD [facility] 06/14/2018 4:23 PM  Office Number [phone]

## 2018-05-30 NOTE — Patient Instructions (Addendum)
Shortness of breath -Pulmonary function test prior to next visit -Management as below  Snoring Split night study  Cough Start Protonix 40 mg twice a day GERD diet       Food Choices for Gastroesophageal Reflux Disease, Adult When you have gastroesophageal reflux disease (GERD), the foods you eat and your eating habits are very important. Choosing the right foods can help ease your discomfort. Think about working with a nutrition specialist (dietitian) to help you make good choices. What are tips for following this plan?  Meals  Choose healthy foods that are low in fat, such as fruits, vegetables, whole grains, low-fat dairy products, and lean meat, fish, and poultry.  Eat small meals often instead of 3 large meals a day. Eat your meals slowly, and in a place where you are relaxed. Avoid bending over or lying down until 2-3 hours after eating.  Avoid eating meals 2-3 hours before bed.  Avoid drinking a lot of liquid with meals.  Cook foods using methods other than frying. Bake, grill, or broil food instead.  Avoid or limit: ? Chocolate. ? Peppermint or spearmint. ? Alcohol. ? Pepper. ? Black and decaffeinated coffee. ? Black and decaffeinated tea. ? Bubbly (carbonated) soft drinks. ? Caffeinated energy drinks and soft drinks.  Limit high-fat foods such as: ? Fatty meat or fried foods. ? Whole milk, cream, butter, or ice cream. ? Nuts and nut butters. ? Pastries, donuts, and sweets made with butter or shortening.  Avoid foods that cause symptoms. These foods may be different for everyone. Common foods that cause symptoms include: ? Tomatoes. ? Oranges, lemons, and limes. ? Peppers. ? Spicy food. ? Onions and garlic. ? Vinegar. Lifestyle  Maintain a healthy weight. Ask your doctor what weight is healthy for you. If you need to lose weight, work with your doctor to do so safely.  Exercise for at least 30 minutes for 5 or more days each week, or as told by your  doctor.  Wear loose-fitting clothes.  Do not smoke. If you need help quitting, ask your doctor.  Sleep with the head of your bed higher than your feet. Use a wedge under the mattress or blocks under the bed frame to raise the head of the bed. Summary  When you have gastroesophageal reflux disease (GERD), food and lifestyle choices are very important in easing your symptoms.  Eat small meals often instead of 3 large meals a day. Eat your meals slowly, and in a place where you are relaxed.  Limit high-fat foods such as fatty meat or fried foods.  Avoid bending over or lying down until 2-3 hours after eating.  Avoid peppermint and spearmint, caffeine, alcohol, and chocolate. This information is not intended to replace advice given to you by your health care provider. Make sure you discuss any questions you have with your health care provider. Document Released: 09/18/2011 Document Revised: 04/24/2016 Document Reviewed: 04/24/2016 Elsevier Interactive Patient Education  2019 [employer].

## 2018-06-03 DIAGNOSIS — M9901 Segmental and somatic dysfunction of cervical region: Secondary | ICD-10-CM | POA: Diagnosis not present

## 2018-06-03 DIAGNOSIS — M9902 Segmental and somatic dysfunction of thoracic region: Secondary | ICD-10-CM | POA: Diagnosis not present

## 2018-06-03 DIAGNOSIS — M5033 Other cervical disc degeneration, cervicothoracic region: Secondary | ICD-10-CM | POA: Diagnosis not present

## 2018-06-03 DIAGNOSIS — M5134 Other intervertebral disc degeneration, thoracic region: Secondary | ICD-10-CM | POA: Diagnosis not present

## 2018-06-16 ENCOUNTER — Ambulatory Visit (INDEPENDENT_AMBULATORY_CARE_PROVIDER_SITE_OTHER): Payer: Medicare Other | Admitting: Pulmonary Disease

## 2018-06-16 ENCOUNTER — Other Ambulatory Visit: Payer: Self-pay

## 2018-06-16 DIAGNOSIS — R0602 Shortness of breath: Secondary | ICD-10-CM

## 2018-06-16 LAB — PULMONARY FUNCTION TEST
DL/VA % pred: 129 %
DL/VA: 5.17 ml/min/mmHg/L
DLCO UNC % PRED: 107 %
DLCO UNC: 29.46 ml/min/mmHg
FEF 25-75 PRE: 2.62 L/s
FEF 25-75 Post: 3.24 L/sec
FEF2575-%Change-Post: 23 %
FEF2575-%PRED-PRE: 100 %
FEF2575-%Pred-Post: 124 %
FEV1-%Change-Post: 6 %
FEV1-%PRED-POST: 87 %
FEV1-%Pred-Pre: 82 %
FEV1-Post: 3.04 L
FEV1-Pre: 2.85 L
FEV1FVC-%Change-Post: 4 %
FEV1FVC-%Pred-Pre: 104 %
FEV6-%CHANGE-POST: 3 %
FEV6-%PRED-POST: 84 %
FEV6-%PRED-PRE: 81 %
FEV6-POST: 3.78 L
FEV6-Pre: 3.65 L
FEV6FVC-%CHANGE-POST: 1 %
FEV6FVC-%PRED-POST: 105 %
FEV6FVC-%Pred-Pre: 104 %
FVC-%Change-Post: 1 %
FVC-%Pred-Post: 80 %
FVC-%Pred-Pre: 78 %
FVC-Post: 3.79 L
FVC-Pre: 3.71 L
POST FEV6/FVC RATIO: 100 %
PRE FEV1/FVC RATIO: 77 %
Post FEV1/FVC ratio: 80 %
Pre FEV6/FVC Ratio: 98 %
RV % PRED: 123 %
RV: 3.18 L
TLC % PRED: 92 %
TLC: 6.87 L

## 2018-06-16 NOTE — Progress Notes (Signed)
Full PFT performed today. °

## 2018-06-26 ENCOUNTER — Telehealth: Payer: Self-pay | Admitting: Pulmonary Disease

## 2018-06-26 NOTE — Telephone Encounter (Signed)
Pt made aware normal PFTs.

## 2018-06-30 ENCOUNTER — Other Ambulatory Visit: Payer: Self-pay | Admitting: Cardiology

## 2018-07-17 ENCOUNTER — Encounter (HOSPITAL_BASED_OUTPATIENT_CLINIC_OR_DEPARTMENT_OTHER): Payer: Medicare Other

## 2018-08-24 ENCOUNTER — Other Ambulatory Visit: Payer: Self-pay | Admitting: Cardiology

## 2018-08-28 ENCOUNTER — Ambulatory Visit: Payer: Medicare Other | Admitting: Pulmonary Disease

## 2018-09-16 DIAGNOSIS — G250 Essential tremor: Secondary | ICD-10-CM | POA: Diagnosis not present

## 2018-09-16 DIAGNOSIS — S29011A Strain of muscle and tendon of front wall of thorax, initial encounter: Secondary | ICD-10-CM | POA: Diagnosis not present

## 2018-10-17 ENCOUNTER — Other Ambulatory Visit: Payer: Self-pay | Admitting: Cardiology

## 2018-11-10 DIAGNOSIS — N6310 Unspecified lump in the right breast, unspecified quadrant: Secondary | ICD-10-CM | POA: Diagnosis not present

## 2018-11-10 DIAGNOSIS — R0602 Shortness of breath: Secondary | ICD-10-CM | POA: Diagnosis not present

## 2018-11-12 ENCOUNTER — Other Ambulatory Visit: Payer: Self-pay | Admitting: Nurse Practitioner

## 2018-11-12 DIAGNOSIS — N6310 Unspecified lump in the right breast, unspecified quadrant: Secondary | ICD-10-CM

## 2018-11-13 ENCOUNTER — Other Ambulatory Visit: Payer: Self-pay | Admitting: Cardiology

## 2018-11-18 ENCOUNTER — Ambulatory Visit
Admission: RE | Admit: 2018-11-18 | Discharge: 2018-11-18 | Disposition: A | Discharge status: Home / Self Care | Payer: Medicare Other | Source: Ambulatory Visit | Attending: Nurse Practitioner | Admitting: Nurse Practitioner

## 2018-11-18 ENCOUNTER — Other Ambulatory Visit: Payer: Self-pay

## 2018-11-18 ENCOUNTER — Ambulatory Visit: Payer: Medicare Other

## 2018-11-18 ENCOUNTER — Telehealth: Payer: Self-pay | Admitting: Cardiology

## 2018-11-18 DIAGNOSIS — R928 Other abnormal and inconclusive findings on diagnostic imaging of breast: Secondary | ICD-10-CM | POA: Diagnosis not present

## 2018-11-18 DIAGNOSIS — N6310 Unspecified lump in the right breast, unspecified quadrant: Secondary | ICD-10-CM

## 2018-11-18 NOTE — Telephone Encounter (Deleted)
°*  STAT* If patient is at the pharmacy, call can be transferred to refill team. ° ° °1. Which medications need to be refilled? (please list name of each medication and dose if known) ***** ° °2. Which pharmacy/location (including street and city if local pharmacy) is medication to be sent to?**** ° °3. Do they need a 30 day or 90 day supply? *** ° °

## 2018-12-11 DIAGNOSIS — Z23 Encounter for immunization: Secondary | ICD-10-CM | POA: Diagnosis not present

## 2019-01-22 ENCOUNTER — Other Ambulatory Visit: Payer: Self-pay | Admitting: Cardiology

## 2019-01-26 ENCOUNTER — Other Ambulatory Visit: Payer: Self-pay | Admitting: Cardiology

## 2019-02-12 DIAGNOSIS — R030 Elevated blood-pressure reading, without diagnosis of hypertension: Secondary | ICD-10-CM | POA: Diagnosis not present

## 2019-03-22 ENCOUNTER — Other Ambulatory Visit: Payer: Self-pay | Admitting: Cardiology

## 2019-04-08 NOTE — Progress Notes (Signed)
Cardiology Office Note:    Date:  04/09/2019   ID:  Todd Chan, DOB 17-Mar-1946, MRN [id number]  PCP:  Lyman Bishop, DO  Cardiologist:  Shirlee More, MD    Referring MD: Lyman Bishop, DO    ASSESSMENT:    1. Mild CAD   2. Essential hypertension   3. Mixed hyperlipidemia   4. Enlarged thoracic aorta (HCC)    PLAN:    In order of problems listed above:  1. Stable CAD continue treatment aspirin blood pressure medication statin check lipid profile goal LDL less than 100.  At this time I do not think he needs a repeat ischemia evaluation 2. Stable blood pressure BP at target continue treatment 3. Continue statin check lipid profile 4. Follow-up CT chest no contrast 5. Shortness of breath appears to be noncardiac nonpulmonary with his body mass and deconditioning I challenged him to start a regular walking program.  If unimproved to reconsider the merits of a sleep study   Next appointment: 1 year   Medication Adjustments/Labs and Tests Ordered: Current medicines are reviewed at length with the patient today.  Concerns regarding medicines are outlined above.  No orders of the defined types were placed in this encounter.  No orders of the defined types were placed in this encounter.   Chief Complaint  Patient presents with  . Follow-up  . Coronary Artery Disease    History of Present Illness:    Todd Chan is a 74 y.o. male with a hx of mild CAD and enlargement of the ascending  thoracic aorta 42 mm on CT 04/05/2018 last seen 05/13/2018. Compliance with diet, lifestyle and medications: Yes  Continues to be short of breath especially with conversation but with physical activity is advised to have a sleep study and declined and does not think he has significant snoring.  Body mass in an activity I think that is deconditioning plays a role and I strongly encouraged him to get in a regular exercise program.  He is having no chest pain edema orthopnea  palpitation or syncope.  He wants to delay follow-up CT no contrast for thoracic aorta until after he has received COVID-19 vaccine.  We will also check a lipid profile and CMP today on a statin. With complaints of shortness of breath is seen by pulmonary PFTs are unremarkable there is concern of obstructive sleep apnea was treated for reflux induced symptoms with a PPI and diet. Left heart catheterization 04/17/2017: Conclusion    Ost LAD to Prox LAD lesion is 15% stenosed.  Prox LAD lesion is 10% stenosed.  The left ventricular systolic function is normal.  LV end diastolic pressure is normal.  The left ventricular ejection fraction is 55-65% by visual estimate.   Normal LV function without focal wall motion abnormalities.  EF 55-60%.  No significant coronary obstructive disease, although there is a small area of calcification superior to the ostium of the a short left main, 10-15% luminal irregularity of the proximal LAD and a normal ramus intermediate, left circumflex, and dominant RCA   Diagnostic Dominance: Right   Past Medical History:  Diagnosis Date  . Arthritis   . GERD (gastroesophageal reflux disease)   . Hypertension   . Osteoarthritis of knee   . Painful urination   . Peripheral vascular disease ([place])    bilateral edema     Past Surgical History:  Procedure Laterality Date  . CARDIAC CATHETERIZATION  04/2017  . ESOPHAGOGASTRODUODENOSCOPY ENDOSCOPY  8 days ago  . KNEE ARTHROSCOPY Left 6-7 years ago  . LEFT HEART CATH AND CORONARY ANGIOGRAPHY N/A 04/17/2017   Procedure: LEFT HEART CATH AND CORONARY ANGIOGRAPHY;  Surgeon: Troy Sine, MD;  Location: [facility];  Service: Cardiovascular;  Laterality: N/A;  . 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 TONSILLECTOMY    . TOTAL KNEE ARTHROPLASTY Left 10/08/2016   Procedure: LEFT TOTAL KNEE ARTHROPLASTY;  Surgeon: Gaynelle Arabian, MD;  Location: [facility];  Service: Orthopedics;  Laterality: Left;  .  TOTAL KNEE ARTHROPLASTY Right 06/17/2017   Procedure: RIGHT TOTAL KNEE ARTHROPLASTY;  Surgeon: Gaynelle Arabian, MD;  Location: [facility];  Service: Orthopedics;  Laterality: Right;    Current Medications: Current Meds  Medication Sig  . aspirin EC 81 MG tablet Take 81 mg by mouth daily.  . Cholecalciferol (VITAMIN D3) 50 MCG (2000 UT) capsule Take 2,000 Units by mouth daily.  . metoprolol tartrate (LOPRESSOR) 25 MG tablet TAKE 1 TABLET TWICE A DAY  . Multiple Vitamin (MULTIVITAMIN) capsule Take 1 capsule by mouth daily.  . Omega-3 Fatty Acids (FISH OIL) 1000 MG CAPS Take 1,000 mg by mouth daily.  . pantoprazole (PROTONIX) 40 MG tablet Take 1 tablet (40 mg total) by mouth daily.  . pravastatin (PRAVACHOL) 20 MG tablet TAKE 1 TABLET BY MOUTH EVERY DAY IN THE EVENING  . telmisartan-hydrochlorothiazide (MICARDIS HCT) 40-12.5 MG tablet TAKE 1 TABLET DAILY     Allergies:   Patient has no known allergies.   Social History   Socioeconomic History  . Marital status: Married    Spouse name: Not on file  . Number of children: 2  . Years of education: Not on file  . Highest education level: Bachelor's degree (e.g., BA, AB, BS)  Occupational History  . Not on file  Tobacco Use  . Smoking status: Former Smoker    Packs/day: 1.00    Years: 20.00    Pack years: 20.00    Quit date: 10/1987    Years since quitting: 31.5  . Smokeless tobacco: Never Used  . Tobacco comment: quit 30 yeara ago   Substance and Sexual Activity  . Alcohol use: Yes    Comment: 3 drinks per day- wine and scotch  . Drug use: No  . Sexual activity: Not on file  Other Topics Concern  . Not on file  Social History Narrative   Lives at home with his wife   Left handed   Drinks 2-3 cups of caffeine daily   Social Determinants of Health   Financial Resource Strain:   . Difficulty of Paying Living Expenses: Not on file  Food Insecurity:   . Worried About Charity fundraiser in the Last Year: Not on file  . Ran Out of  Food in the Last Year: Not on file  Transportation Needs:   . Lack of Transportation (Medical): Not on file  . Lack of Transportation (Non-Medical): Not on file  Physical Activity:   . Days of Exercise per Week: Not on file  . Minutes of Exercise per Session: Not on file  Stress:   . Feeling of Stress : Not on file  Social Connections:   . Frequency of Communication with Friends and Family: Not on file  . Frequency of Social Gatherings with Friends and Family: Not on file  . Attends Religious Services: Not on file  . Active Member of Clubs or Organizations: Not on file  . Attends Archivist Meetings: Not on  file  . Marital Status: Not on file     Family History: The patient's family history includes Breast cancer (age of onset: 39) in his sister; Breast cancer (age of onset: 51) in his mother; Prostate cancer in his father. There is no history of Neuropathy. ROS:   Please see the history of present illness.    All other systems reviewed and are negative.  EKGs/Labs/Other Studies Reviewed:    The following studies were reviewed today:  EKG:  EKG ordered today and personally reviewed.  The ekg ordered today demonstrates baseline artifact from tremor sinus rhythm otherwise normal  Recent Labs: 05/13/2018: ALT 22; BUN 17; Creatinine, Ser 1.02; Potassium 4.5; Sodium 141  Recent Lipid Panel    Component Value Date/Time   CHOL 174 05/13/2018 1009   TRIG 60 05/13/2018 1009   HDL 56 05/13/2018 1009   CHOLHDL 3.1 05/13/2018 1009   LDLCALC 106 (H) 05/13/2018 1009    Physical Exam:    VS:  BP 134/86   Pulse 89   Ht 6' (1.829 m)   Wt 300 lb (136.1 kg)   SpO2 97%   BMI 40.69 kg/m     Wt Readings from Last 3 Encounters:  04/09/19 300 lb (136.1 kg)  05/30/18 (!) 302 lb 12.8 oz (137.3 kg)  05/13/18 299 lb 12.8 oz (136 kg)     GEN:  Well nourished, well developed in no acute distress HEENT: Normal NECK: No JVD; No carotid bruits LYMPHATICS: No  lymphadenopathy CARDIAC: RRR, no murmurs, rubs, gallops RESPIRATORY:  Clear to auscultation without rales, wheezing or rhonchi  ABDOMEN: Soft, non-tender, non-distended MUSCULOSKELETAL:  No edema; No deformity  SKIN: Warm and dry NEUROLOGIC:  Alert and oriented x 3 PSYCHIATRIC:  Normal affect    Signed, Shirlee More, MD  04/09/2019 11:47 AM    [place]

## 2019-04-09 ENCOUNTER — Encounter: Payer: Self-pay | Admitting: Cardiology

## 2019-04-09 ENCOUNTER — Ambulatory Visit (INDEPENDENT_AMBULATORY_CARE_PROVIDER_SITE_OTHER): Payer: Medicare Other | Admitting: Cardiology

## 2019-04-09 ENCOUNTER — Other Ambulatory Visit: Payer: Self-pay

## 2019-04-09 VITALS — BP 134/86 | HR 89 | Ht 72.0 in | Wt 300.0 lb

## 2019-04-09 DIAGNOSIS — R0602 Shortness of breath: Secondary | ICD-10-CM

## 2019-04-09 DIAGNOSIS — I7789 Other specified disorders of arteries and arterioles: Secondary | ICD-10-CM

## 2019-04-09 DIAGNOSIS — I10 Essential (primary) hypertension: Secondary | ICD-10-CM

## 2019-04-09 DIAGNOSIS — I2510 Atherosclerotic heart disease of native coronary artery without angina pectoris: Secondary | ICD-10-CM | POA: Diagnosis not present

## 2019-04-09 DIAGNOSIS — E782 Mixed hyperlipidemia: Secondary | ICD-10-CM

## 2019-04-09 NOTE — Patient Instructions (Signed)
Medication Instructions:  .Your physician recommends that you continue on your current medications as directed. Please refer to the Current Medication list given to you today.  *If you need a refill on your cardiac medications before your next appointment, please call your pharmacy*  Lab Work: Your physician recommends that you return for lab work in: [place]  If you have labs (blood work) drawn today and your tests are completely normal, you will receive your results only by: [facility] Kitchen MyChart Message (if you have MyChart) OR . A paper copy in the mail If you have any lab test that is abnormal or we need to change your treatment, we will call you to review the results.  Testing/Procedures: Non-Cardiac CT scanning, (CAT scanning), is a noninvasive, special x-ray that produces cross-sectional images of the body using x-rays and a computer. CT scans help physicians diagnose and treat medical conditions. For some CT exams, a contrast material is used to enhance visibility in the area of the body being studied. CT scans provide greater clarity and reveal more details than regular x-ray exams.   Follow-Up: At [facility], you and your health needs are our priority.  As part of our continuing mission to provide you with exceptional heart care, we have created designated Provider Care Teams.  These Care Teams include your primary Cardiologist (physician) and Advanced Practice Providers (APPs -  Physician Assistants and Nurse Practitioners) who all work together to provide you with the care you need, when you need it.  Your next appointment:   1 month(s)  The format for your next appointment:   In Person  Provider:   Shirlee More, MD  Other Instructions

## 2019-04-10 ENCOUNTER — Telehealth: Payer: Self-pay | Admitting: Emergency Medicine

## 2019-04-10 DIAGNOSIS — E785 Hyperlipidemia, unspecified: Secondary | ICD-10-CM

## 2019-04-10 LAB — COMPREHENSIVE METABOLIC PANEL
ALT: 28 IU/L (ref 0–44)
AST: 34 IU/L (ref 0–40)
Albumin/Globulin Ratio: 2 (ref 1.2–2.2)
Albumin: 4.7 g/dL (ref 3.7–4.7)
Alkaline Phosphatase: 40 IU/L (ref 39–117)
BUN/Creatinine Ratio: 15 (ref 10–24)
BUN: 14 mg/dL (ref 8–27)
Bilirubin Total: 1.7 mg/dL — ABNORMAL HIGH (ref 0.0–1.2)
CO2: 27 mmol/L (ref 20–29)
Calcium: 9.4 mg/dL (ref 8.6–10.2)
Chloride: 100 mmol/L (ref 96–106)
Creatinine, Ser: 0.93 mg/dL (ref 0.76–1.27)
GFR calc Af Amer: 95 mL/min/{1.73_m2} (ref >59)
GFR calc non Af Amer: 82 mL/min/{1.73_m2} (ref >59)
Globulin, Total: 2.3 g/dL (ref 1.5–4.5)
Glucose: 102 mg/dL — ABNORMAL HIGH (ref 65–99)
Potassium: 4.1 mmol/L (ref 3.5–5.2)
Sodium: 139 mmol/L (ref 134–144)
Total Protein: 7 g/dL (ref 6.0–8.5)

## 2019-04-10 LAB — LIPID PANEL
Chol/HDL Ratio: 3 ratio (ref 0.0–5.0)
Cholesterol, Total: 197 mg/dL (ref 100–199)
HDL: 66 mg/dL (ref >39)
LDL Chol Calc (NIH): 121 mg/dL — ABNORMAL HIGH (ref 0–99)
Triglycerides: 54 mg/dL (ref 0–149)
VLDL Cholesterol Cal: 10 mg/dL (ref 5–40)

## 2019-04-10 MED ORDER — EZETIMIBE 10 MG PO TABS: 10.0000 mg | ORAL_TABLET | Freq: Every day | ORAL | 2 refills | Status: DC

## 2019-04-10 NOTE — Telephone Encounter (Signed)
Called patient informed him of lab results and per Dr. Bettina Gavia to start zetia  10 mg  Daily and have fasting labs redrawn in 6 weeks. Patient requests to have them drawn at next appointment with Dr. Bettina Gavia which is in 4 weeks. I advised him to ask him about this at the next visit and if not plan to come after 6 weeks as discussed. Patient verbally understood no further questions.

## 2019-04-13 ENCOUNTER — Other Ambulatory Visit: Payer: Self-pay | Admitting: Cardiology

## 2019-04-13 MED ORDER — PRAVASTATIN SODIUM 20 MG PO TABS: ORAL_TABLET | ORAL | 3 refills | Status: DC

## 2019-04-13 MED ORDER — EZETIMIBE 10 MG PO TABS: 10.0000 mg | ORAL_TABLET | Freq: Every day | ORAL | 3 refills | Status: DC

## 2019-04-13 NOTE — Telephone Encounter (Signed)
*  STAT* If patient is at the pharmacy, call can be transferred to refill team.   1. Which medications need to be refilled? (please list name of each medication and dose if known) pravastatin and ezetimibe  2. Which pharmacy/location (including street and city if local pharmacy) is medication to be sent to?[name]  3. Do they need a 30 day or 90 day supply? [place]

## 2019-04-13 NOTE — Telephone Encounter (Signed)
Medications refilled

## 2019-04-22 ENCOUNTER — Other Ambulatory Visit: Payer: Self-pay | Admitting: Cardiology

## 2019-05-10 NOTE — Progress Notes (Signed)
Cardiology Office Note:    Date:  05/11/2019   ID:  DWUAN GUSMAN, DOB 1945-10-16, MRN [id number]  PCP:  Lyman Bishop, DO  Cardiologist:  Shirlee More, MD    Referring MD: Lyman Bishop, DO    ASSESSMENT:    1. Mixed hyperlipidemia   2. Essential hypertension   3. Obesity, Class II, BMI 35-39.9    PLAN:    In order of problems listed above:  1. Continue combined treatment statin and Zetia recheck lipids today goal LDL less than 100 with CAD 2. Stable hypertension continue treatment ARB thiazide diuretic and instructed in technique for accurately checking blood pressure at home.  Recent renal function potassium are normal 3. Encouraged him he is on a diet he has lost weight he is enthusiastic to continue with his plan weight loss with a BMI of over 35 and comorbidities   Next appointment: 6 months   Medication Adjustments/Labs and Tests Ordered: Current medicines are reviewed at length with the patient today.  Concerns regarding medicines are outlined above.  No orders of the defined types were placed in this encounter.  No orders of the defined types were placed in this encounter.   Chief Complaint  Patient presents with  . Follow-up    History of Present Illness:    Todd Chan is a 74 y.o. 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  With significant residual elevation of LDL cholesterol Zetia was added to his pravastatin at last visit. Compliance with diet, lifestyle and medications: Yes  Is getting erratic blood pressure readings at home.  I had him rest 5 minutes supported his feet supported his back and checked his blood pressure repeat 128/80.  Chest pain shortness of breath palpitation or syncope but he has mild orthostatic lightheadedness.  Other complaints include pain in one of his knuckles and neck tightness related to posture.  Not having angina.  He tolerates  Zetia without muscle symptoms.  We will recheck a lipid profile today Past Medical History:  Diagnosis Date  . Arthritis   . GERD (gastroesophageal reflux disease)   . Hypertension   . Osteoarthritis of knee   . Painful urination   . Peripheral vascular disease ([place])    bilateral edema     Past Surgical History:  Procedure Laterality Date  . CARDIAC CATHETERIZATION  04/2017  . ESOPHAGOGASTRODUODENOSCOPY ENDOSCOPY     8 days ago  . KNEE ARTHROSCOPY Left 6-7 years ago  . LEFT HEART CATH AND CORONARY ANGIOGRAPHY N/A 04/17/2017   Procedure: LEFT HEART CATH AND CORONARY ANGIOGRAPHY;  Surgeon: Troy Sine, MD;  Location: [facility];  Service: Cardiovascular;  Laterality: N/A;  . 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 TONSILLECTOMY    . TOTAL KNEE ARTHROPLASTY Left 10/08/2016   Procedure: LEFT TOTAL KNEE ARTHROPLASTY;  Surgeon: Gaynelle Arabian, MD;  Location: [facility];  Service: Orthopedics;  Laterality: Left;  . TOTAL KNEE ARTHROPLASTY Right 06/17/2017   Procedure: RIGHT TOTAL KNEE ARTHROPLASTY;  Surgeon: Gaynelle Arabian, MD;  Location: [facility];  Service: Orthopedics;  Laterality: Right;    Current Medications: Current Meds  Medication Sig  . aspirin EC 81 MG tablet Take 81 mg by mouth daily.  . Cholecalciferol (VITAMIN D3) 50 MCG (2000 UT) capsule Take 2,000 Units by mouth daily.  Marland Kitchen ezetimibe (ZETIA) 10 MG tablet Take 1 tablet (10 mg total) by  mouth daily.  . metoprolol tartrate (LOPRESSOR) 25 MG tablet TAKE 1 TABLET TWICE A DAY  . Multiple Vitamin (MULTIVITAMIN) capsule Take 1 capsule by mouth daily.  . Omega-3 Fatty Acids (FISH OIL) 1000 MG CAPS Take 1,000 mg by mouth daily.  . pantoprazole (PROTONIX) 40 MG tablet Take 1 tablet (40 mg total) by mouth daily.  . pravastatin (PRAVACHOL) 20 MG tablet TAKE 1 TABLET BY MOUTH EVERY DAY IN THE EVENING  . telmisartan-hydrochlorothiazide (MICARDIS HCT) 40-12.5 MG tablet TAKE 1 TABLET DAILY     Allergies:   Patient has no known  allergies.   Social History   Socioeconomic History  . Marital status: Married    Spouse name: Not on file  . Number of children: 2  . Years of education: Not on file  . Highest education level: Bachelor's degree (e.g., BA, AB, BS)  Occupational History  . Not on file  Tobacco Use  . Smoking status: Former Smoker    Packs/day: 1.00    Years: 20.00    Pack years: 20.00    Quit date: 10/1987    Years since quitting: 31.6  . Smokeless tobacco: Never Used  . Tobacco comment: quit 30 yeara ago   Substance and Sexual Activity  . Alcohol use: Yes    Comment: 3 drinks per day- wine and scotch  . Drug use: No  . Sexual activity: Yes  Other Topics Concern  . Not on file  Social History Narrative   Lives at home with his wife   Left handed   Drinks 2-3 cups of caffeine daily   Social Determinants of Health   Financial Resource Strain:   . Difficulty of Paying Living Expenses: Not on file  Food Insecurity:   . Worried About Charity fundraiser in the Last Year: Not on file  . Ran Out of Food in the Last Year: Not on file  Transportation Needs:   . Lack of Transportation (Medical): Not on file  . Lack of Transportation (Non-Medical): Not on file  Physical Activity:   . Days of Exercise per Week: Not on file  . Minutes of Exercise per Session: Not on file  Stress:   . Feeling of Stress : Not on file  Social Connections:   . Frequency of Communication with Friends and Family: Not on file  . Frequency of Social Gatherings with Friends and Family: Not on file  . Attends Religious Services: Not on file  . Active Member of Clubs or Organizations: Not on file  . Attends Archivist Meetings: Not on file  . Marital Status: Not on file     Family History: The patient's family history includes Breast cancer (age of onset: 75) in his sister; Breast cancer (age of onset: 67) in his mother; Prostate cancer in his father. There is no history of Neuropathy. ROS:   Please see  the history of present illness.    All other systems reviewed and are negative.  EKGs/Labs/Other Studies Reviewed:    The following studies were reviewed today:   Recent Labs: 04/09/2019: ALT 28; BUN 14; Creatinine, Ser 0.93; Potassium 4.1; Sodium 139  Recent Lipid Panel    Component Value Date/Time   CHOL 197 04/09/2019 1210   TRIG 54 04/09/2019 1210   HDL 66 04/09/2019 1210   CHOLHDL 3.0 04/09/2019 1210   LDLCALC 121 (H) 04/09/2019 1210    Physical Exam:    VS:  BP (!) 138/92   Pulse 74  Temp 97.7 F (36.5 C)   Ht 6' (1.829 m)   Wt 293 lb 1.9 oz (133 kg)   SpO2 97%   BMI 39.75 kg/m     Wt Readings from Last 3 Encounters:  05/11/19 293 lb 1.9 oz (133 kg)  04/09/19 300 lb (136.1 kg)  05/30/18 (!) 302 lb 12.8 oz (137.3 kg)     GEN:  Well nourished, well developed in no acute distress HEENT: Normal NECK: No JVD; No carotid bruits LYMPHATICS: No lymphadenopathy CARDIAC: RRR, no murmurs, rubs, gallops RESPIRATORY:  Clear to auscultation without rales, wheezing or rhonchi  ABDOMEN: Soft, non-tender, non-distended MUSCULOSKELETAL:  No edema; No deformity  SKIN: Warm and dry NEUROLOGIC:  Alert and oriented x 3 PSYCHIATRIC:  Normal affect    Signed, Shirlee More, MD  05/11/2019 11:45 AM    [place]

## 2019-05-11 ENCOUNTER — Encounter: Payer: Self-pay | Admitting: Cardiology

## 2019-05-11 ENCOUNTER — Ambulatory Visit (INDEPENDENT_AMBULATORY_CARE_PROVIDER_SITE_OTHER): Payer: Medicare Other | Admitting: Cardiology

## 2019-05-11 ENCOUNTER — Other Ambulatory Visit: Payer: Self-pay

## 2019-05-11 VITALS — BP 138/92 | HR 74 | Temp 97.7°F | Ht 72.0 in | Wt 293.1 lb

## 2019-05-11 DIAGNOSIS — E782 Mixed hyperlipidemia: Secondary | ICD-10-CM | POA: Diagnosis not present

## 2019-05-11 DIAGNOSIS — E669 Obesity, unspecified: Secondary | ICD-10-CM | POA: Diagnosis not present

## 2019-05-11 DIAGNOSIS — I10 Essential (primary) hypertension: Secondary | ICD-10-CM | POA: Diagnosis not present

## 2019-05-11 NOTE — Patient Instructions (Addendum)
[facility]  Tips to measure your blood pressure correctly Medication Instructions:  Your physician recommends that you continue on your current medications as directed. Please refer to the Current Medication list given to you today.  *If you need a refill on your cardiac medications before your next appointment, please call your pharmacy*  Lab Work: Your physician recommends that you return for lab work in: [place]  If you have labs (blood work) drawn today and your tests are completely normal, you will receive your results only by: Marland Kitchen MyChart Message (if you have MyChart) OR . A paper copy in the mail If you have any lab test that is abnormal or we need to change your treatment, we will call you to review the results.  Testing/Procedures: NOne  Follow-Up: At [facility], you and your health needs are our priority.  As part of our continuing mission to provide you with exceptional heart care, we have created designated Provider Care Teams.  These Care Teams include your primary Cardiologist (physician) and Advanced Practice Providers (APPs -  Physician Assistants and Nurse Practitioners) who all work together to provide you with the care you need, when you need it.  Your next appointment:   6 month(s)  The format for your next appointment:   In Person  Provider:   Shirlee More, MD  Other Instructions   To determine whether you have hypertension, a medical professional will take a blood pressure reading. How you prepare for the test, the position of your arm, and other factors can change a blood pressure reading by 10% or more. That could be enough to hide high blood pressure, start you on a drug you don't really need, or lead your doctor to incorrectly adjust your medications. National and international guidelines offer specific instructions for measuring blood pressure. If a doctor, nurse, or medical assistant isn't doing it right, don't hesitate to ask him or her to get  with the guidelines. Here's what you can do to ensure a correct reading: . Don't drink a caffeinated beverage or smoke during the 30 minutes before the test. . Sit quietly for five minutes before the test begins. . During the measurement, sit in a chair with your feet on the floor and your arm supported so your elbow is at about heart level. . The inflatable part of the cuff should completely cover at least 80% of your upper arm, and the cuff should be placed on bare skin, not over a shirt. . Don't talk during the measurement. . Have your blood pressure measured twice, with a brief break in between. If the readings are different by 5 points or more, have it done a third time. There are times to break these rules. If you sometimes feel lightheaded when getting out of bed in the morning or when you stand after sitting, you should have your blood pressure checked while seated and then while standing to see if it falls from one position to the next. Because blood pressure varies throughout the day, your doctor will rarely diagnose hypertension on the basis of a single reading. Instead, he or she will want to confirm the measurements on at least two occasions, usually within a few weeks of one another. The exception to this rule is if you have a blood pressure reading of 180/110 mm Hg or higher. A result this high usually calls for prompt treatment. It's also a good idea to have your blood pressure measured in both arms at least once, since  the reading in one arm (usually the right) may be higher than that in the left. A 2014 study in The American Journal of Medicine of nearly 3,400 people found average arm- to-arm differences in systolic blood pressure of about 5 points. The higher number should be used to make treatment decisions. In 2017, new guidelines from the [employer], the [employer] of [employer], and nine other health organizations lowered the diagnosis of high blood pressure to  130/80 mm Hg or higher for all adults. The guidelines also redefined the various blood pressure categories to now include normal, elevated, Stage 1 hypertension, Stage 2 hypertension, and hypertensive crisis (see "Blood pressure categories"). Blood pressure categories  Blood pressure category SYSTOLIC (upper number)  DIASTOLIC (lower number)  Normal Less than 120 mm Hg and Less than 80 mm Hg  Elevated 120-129 mm Hg and Less than 80 mm Hg  High blood pressure: Stage 1 hypertension 130-139 mm Hg or 80-89 mm Hg  High blood pressure: Stage 2 hypertension 140 mm Hg or higher or 90 mm Hg or higher  Hypertensive crisis (consult your doctor immediately) Higher than 180 mm Hg and/or Higher than 120 mm Hg  Source: American Heart Association and American Stroke Association. For more on getting your blood pressure under control, buy Controlling Your Blood Pressure, a Special Health Report from [facility].DASH diet: Healthy eating to lower your blood pressure The DASH diet emphasizes portion size, eating a variety of foods and getting the right amount of nutrients. Discover how DASH can improve your health and lower your blood pressure. By [facility] Staff  DASH stands for Dietary Approaches to Stop Hypertension. The DASH diet is a lifelong approach to healthy eating that's designed to help treat or prevent high blood pressure (hypertension). The DASH diet encourages you to reduce the sodium in your diet and eat a variety of foods rich in nutrients that help lower blood pressure, such as potassium, calcium and magnesium. By following the DASH diet, you may be able to reduce your blood pressure by a few points in just two weeks. Over time, your systolic blood pressure could drop by eight to 14 points, which can make a significant difference in your health risks. Because the DASH diet is a healthy way of eating, it offers health benefits besides just lowering blood pressure. The DASH diet is also in  line with dietary recommendations to prevent osteoporosis, cancer, heart disease, stroke and diabetes. DASH diet: Sodium levels The DASH diet emphasizes vegetables, fruits and low-fat dairy foods -- and moderate amounts of whole grains, fish, poultry and nuts. In addition to the standard DASH diet, there is also a lower sodium version of the diet. You can choose the version of the diet that meets your health needs: Standard DASH diet. You can consume up to 2,300 milligrams (mg) of sodium a day.  Lower sodium DASH diet. You can consume up to 1,500 mg of sodium a day. Both versions of the DASH diet aim to reduce the amount of sodium in your diet compared with what you might get in a typical American diet, which can amount to a whopping 3,400 mg of sodium a day or more. The standard DASH diet meets the recommendation from the Dietary Guidelines for Americans to keep daily sodium intake to less than 2,300 mg a day. The American Heart Association recommends 1,500 mg a day of sodium as an upper limit for all adults. If you aren't sure what sodium level is  right for you, talk to your doctor. DASH diet: What to eat Both versions of the DASH diet include lots of whole grains, fruits, vegetables and low-fat dairy products. The DASH diet also includes some fish, poultry and legumes, and encourages a small amount of nuts and seeds a few times a week.  You can eat red meat, sweets and fats in small amounts. The DASH diet is low in saturated fat, cholesterol and total fat. Here's a look at the recommended servings from each food group for the 2,000-calorie-a-day DASH diet. Grains: 6 to 8 servings a day Grains include bread, cereal, rice and pasta. Examples of one serving of grains include 1 slice whole-wheat bread, 1 ounce dry cereal, or 1/2 cup cooked cereal, rice or pasta. Focus on whole grains because they have more fiber and nutrients than do refined grains. For instance, use brown rice instead of white rice,  whole-wheat pasta instead of regular pasta and whole-grain bread instead of white bread. Look for products labeled "100 percent whole grain" or "100 percent whole wheat."  Grains are naturally low in fat. Keep them this way by avoiding butter, cream and cheese sauces. Vegetables: 4 to 5 servings a day Tomatoes, carrots, broccoli, sweet potatoes, greens and other vegetables are full of fiber, vitamins, and such minerals as potassium and magnesium. Examples of one serving include 1 cup raw leafy green vegetables or 1/2 cup cut-up raw or cooked vegetables. Don't think of vegetables only as side dishes -- a hearty blend of vegetables served over brown rice or whole-wheat noodles can serve as the main dish for a meal.  Fresh and frozen vegetables are both good choices. When buying frozen and canned vegetables, choose those labeled as low sodium or without added salt.  To increase the number of servings you fit in daily, be creative. In a stir-fry, for instance, cut the amount of meat in half and double up on the vegetables. Fruits: 4 to 5 servings a day Many fruits need little preparation to become a healthy part of a meal or snack. Like vegetables, they're packed with fiber, potassium and magnesium and are typically low in fat -- coconuts are an exception. Examples of one serving include one medium fruit, 1/2 cup fresh, frozen or canned fruit, or 4 ounces of juice. Have a piece of fruit with meals and one as a snack, then round out your day with a dessert of fresh fruits topped with a dollop of low-fat yogurt.  Leave on edible peels whenever possible. The peels of apples, pears and most fruits with pits add interesting texture to recipes and contain healthy nutrients and fiber.  Remember that citrus fruits and juices, such as grapefruit, can interact with certain medications, so check with your doctor or pharmacist to see if they're OK for you.  If you choose canned fruit or juice, make sure no sugar is  added. Dairy: 2 to 3 servings a day Milk, yogurt, cheese and other dairy products are major sources of calcium, vitamin D and protein. But the key is to make sure that you choose dairy products that are low fat or fat-free because otherwise they can be a major source of fat -- and most of it is saturated. Examples of one serving include 1 cup skim or 1 percent milk, 1 cup low fat yogurt, or 1 1/2 ounces part-skim cheese. Low-fat or fat-free frozen yogurt can help you boost the amount of dairy products you eat while offering a sweet treat. Add fruit  for a healthy twist.  If you have trouble digesting dairy products, choose lactose-free products or consider taking an over-the-counter product that contains the enzyme lactase, which can reduce or prevent the symptoms of lactose intolerance.  Go easy on regular and even fat-free cheeses because they are typically high in sodium. Lean meat, poultry and fish: 6 servings or fewer a day Meat can be a rich source of protein, B vitamins, iron and zinc. Choose lean varieties and aim for no more than 6 ounces a day. Cutting back on your meat portion will allow room for more vegetables. Trim away skin and fat from poultry and meat and then bake, broil, grill or roast instead of frying in fat.  Eat heart-healthy fish, such as salmon, herring and tuna. These types of fish are high in omega-3 fatty acids, which can help lower your total cholesterol. Nuts, seeds and legumes: 4 to 5 servings a week Almonds, sunflower seeds, kidney beans, peas, lentils and other foods in this family are good sources of magnesium, potassium and protein. They're also full of fiber and phytochemicals, which are plant compounds that may protect against some cancers and cardiovascular disease. Serving sizes are small and are intended to be consumed only a few times a week because these foods are high in calories. Examples of one serving include 1/3 cup nuts, 2 tablespoons seeds, or 1/2 cup  cooked beans or peas.  Nuts sometimes get a bad rap because of their fat content, but they contain healthy types of fat -- monounsaturated fat and omega-3 fatty acids. They're high in calories, however, so eat them in moderation. Try adding them to stir-fries, salads or cereals.  Soybean-based products, such as tofu and tempeh, can be a good alternative to meat because they contain all of the amino acids your body needs to make a complete protein, just like meat. Fats and oils: 2 to 3 servings a day Fat helps your body absorb essential vitamins and helps your body's immune system. But too much fat increases your risk of heart disease, diabetes and obesity. The DASH diet strives for a healthy balance by limiting total fat to less than 30 percent of daily calories from fat, with a focus on the healthier monounsaturated fats. Examples of one serving include 1 teaspoon soft margarine, 1 tablespoon mayonnaise or 2 tablespoons salad dressing. Saturated fat and trans fat are the main dietary culprits in increasing your risk of coronary artery disease. DASH helps keep your daily saturated fat to less than 6 percent of your total calories by limiting use of meat, butter, cheese, whole milk, cream and eggs in your diet, along with foods made from lard, solid shortenings, and palm and coconut oils.  Avoid trans fat, commonly found in such processed foods as crackers, baked goods and fried items.  Read food labels on margarine and salad dressing so that you can choose those that are lowest in saturated fat and free of trans fat. Sweets: 5 servings or fewer a week You don't have to banish sweets entirely while following the DASH diet -- just go easy on them. Examples of one serving include 1 tablespoon sugar, jelly or jam, 1/2 cup sorbet, or 1 cup lemonade. When you eat sweets, choose those that are fat-free or low-fat, such as sorbets, fruit ices, jelly beans, hard candy, graham crackers or low-fat cookies.   Artificial sweeteners such as aspartame (NutraSweet, Equal) and sucralose (Splenda) may help satisfy your sweet tooth while sparing the sugar. But remember that  you still must use them sensibly. It's OK to swap a diet cola for a regular cola, but not in place of a more nutritious beverage such as low-fat milk or even plain water.  Cut back on added sugar, which has no nutritional value but can pack on calories. DASH diet: Alcohol and caffeine Drinking too much alcohol can increase blood pressure. The Dietary Guidelines for Americans recommends that men limit alcohol to no more than two drinks a day and women to one or less. The DASH diet doesn't address caffeine consumption. The influence of caffeine on blood pressure remains unclear. But caffeine can cause your blood pressure to rise at least temporarily. If you already have high blood pressure or if you think caffeine is affecting your blood pressure, talk to your doctor about your caffeine consumption. DASH diet and weight loss While the DASH diet is not a weight-loss program, you may indeed lose unwanted pounds because it can help guide you toward healthier food choices. The DASH diet generally includes about 2,000 calories a day. If you're trying to lose weight, you may need to eat fewer calories. You may also need to adjust your serving goals based on your individual circumstances -- something your health care team can help you decide. Tips to cut back on sodium The foods at the core of the DASH diet are naturally low in sodium. So just by following the DASH diet, you're likely to reduce your sodium intake. You also reduce sodium further by: Using sodium-free spices or flavorings with your food instead of salt  Not adding salt when cooking rice, pasta or hot cereal  Rinsing canned foods to remove some of the sodium  Buying foods labeled "no salt added," "sodium-free," "low sodium" or "very low sodium" One teaspoon of table salt has 2,325 mg of  sodium. When you read food labels, you may be surprised at just how much sodium some processed foods contain. Even low-fat soups, canned vegetables, ready-to-eat cereals and sliced [place] from the local deli -- foods you may have considered healthy -- often have lots of sodium. You may notice a difference in taste when you choose low-sodium food and beverages. If things seem too bland, gradually introduce low-sodium foods and cut back on table salt until you reach your sodium goal. That'll give your palate time to adjust. Using salt-free seasoning blends or herbs and spices may also ease the transition. It can take several weeks for your taste buds to get used to less salty foods. Putting the pieces of the DASH diet together Try these strategies to get started on the DASH diet:  Change gradually. If you now eat only one or two servings of fruits or vegetables a day, try to add a serving at lunch and one at dinner. Rather than switching to all whole grains, start by making one or two of your grain servings whole grains. Increasing fruits, vegetables and whole grains gradually can also help prevent bloating or diarrhea that may occur if you aren't used to eating a diet with lots of fiber. You can also try over-the-counter products to help reduce gas from beans and vegetables.  Reward successes and forgive slip-ups. Reward yourself with a nonfood treat for your accomplishments -- rent a movie, purchase a book or get together with a friend. Everyone slips, especially when learning something new. Remember that changing your lifestyle is a long-term process. Find out what triggered your setback and then just pick up where you left off with the  DASH diet.  Add physical activity. To boost your blood pressure lowering efforts even more, consider increasing your physical activity in addition to following the DASH diet. Combining both the DASH diet and physical activity makes it more likely that you'll reduce your blood  pressure.  Get support if you need it. If you're having trouble sticking to your diet, talk to your doctor or dietitian about it. You might get some tips that will help you stick to the DASH diet. Remember, healthy eating isn't an all-or-nothing proposition. What's most important is that, on average, you eat healthier foods with plenty of variety -- both to keep your diet nutritious and to avoid boredom or extremes. And with the DASH diet, you can have both. pid

## 2019-05-11 NOTE — Addendum Note (Signed)
Addended by: Particia Nearing B on: 05/11/2019 12:06 PM   Modules accepted: Orders

## 2019-05-12 LAB — LIPID PANEL
Chol/HDL Ratio: 2.3 ratio (ref 0.0–5.0)
Cholesterol, Total: 113 mg/dL (ref 100–199)
HDL: 50 mg/dL (ref >39)
LDL Chol Calc (NIH): 50 mg/dL (ref 0–99)
Triglycerides: 60 mg/dL (ref 0–149)
VLDL Cholesterol Cal: 13 mg/dL (ref 5–40)

## 2019-05-13 ENCOUNTER — Telehealth: Payer: Self-pay | Admitting: Cardiology

## 2019-05-13 NOTE — Telephone Encounter (Signed)
Spoke with patient. Per Dr. Bettina Gavia: I think it would be a good idea for him to check his blood pressure twice a day 1 or 2 hours after he takes his morning medications and in the late afternoon after 5 minutes of rest. Record his blood pressure sitting and standing for a week. After that call back with the readings and then Dr. Bettina Gavia will have additional information to make a good decision about his medications.  Patient verbalized understanding.

## 2019-05-13 NOTE — Telephone Encounter (Signed)
Spoke with patient. Patient purchased a new blood pressure cuff and decided to try it out. Blood pressure was elevated, patient has rechecked "a few times in the last half hour." Some of the blood pressures were 157/89 and 147/82. Patient reports dizziness/lightheadedness when he stands up and if bent down for a little while. The dizziness lasts for a minute or so. Patient reports he has also been on a new diet with an 8-10 lb weight loss and wonders if this could be related.   Advised patient nurse will route information to Dr. Bettina Gavia and Dr. Joya Gaskins nurse for review. Patient to keep a blood pressure log with twice daily readings starting tomorrow.

## 2019-05-13 NOTE — Telephone Encounter (Signed)
New Message    Pt c/o BP issue:  1. What are your last 5 BP readings? 157/89, 147/82 2. Are you having any other symptoms (ex. Dizziness, headache, blurred vision, passed out)? Some dizziness that he did mention to Dr. Bettina Gavia at his last appt  3. What is your medication issue?   Patient is concerned because he feels that his BP has still be elevated. He is not sure if he would need to be seen or any suggestions. Please call to advise

## 2019-05-13 NOTE — Telephone Encounter (Signed)
I think it be a good idea for him twice a day 1 or 2 hours after he takes morning medications in late afternoon after 5 minutes of rest to record his blood pressure sitting and standing for a week and then have additional information to make a good decision about his medications.

## 2019-05-19 NOTE — Telephone Encounter (Signed)
Patient's MyChart message sent to MD/RN

## 2019-05-19 NOTE — Telephone Encounter (Signed)
Todd Priest, MD  You 6 minutes ago (1:46 PM)   I think that at this time your blood pressures especially standing is good and that continue the current treatment   Message text    Sent to patient via MyChart message

## 2019-05-19 NOTE — Telephone Encounter (Signed)
Follow Up Message  Patient is calling back in about his blood pressure. States that it is still elevated and he is experiencing dizziness while standing up. Patient states that he is going to send an attachment in a mychart message that will include all of the blood pressure readings that he has been recording. Please give patient a call back.

## 2019-05-28 ENCOUNTER — Other Ambulatory Visit: Payer: Self-pay

## 2019-05-28 ENCOUNTER — Encounter: Payer: Self-pay | Admitting: Cardiology

## 2019-05-28 ENCOUNTER — Ambulatory Visit (INDEPENDENT_AMBULATORY_CARE_PROVIDER_SITE_OTHER): Payer: Medicare Other | Admitting: Cardiology

## 2019-05-28 VITALS — BP 126/82 | HR 100 | Ht 72.0 in | Wt 285.8 lb

## 2019-05-28 DIAGNOSIS — I712 Thoracic aortic aneurysm, without rupture: Secondary | ICD-10-CM | POA: Diagnosis not present

## 2019-05-28 DIAGNOSIS — R0683 Snoring: Secondary | ICD-10-CM

## 2019-05-28 DIAGNOSIS — I10 Essential (primary) hypertension: Secondary | ICD-10-CM

## 2019-05-28 DIAGNOSIS — I7121 Aneurysm of the ascending aorta, without rupture: Secondary | ICD-10-CM

## 2019-05-28 DIAGNOSIS — R0600 Dyspnea, unspecified: Secondary | ICD-10-CM

## 2019-05-28 DIAGNOSIS — R0609 Other forms of dyspnea: Secondary | ICD-10-CM

## 2019-05-28 DIAGNOSIS — I2510 Atherosclerotic heart disease of native coronary artery without angina pectoris: Secondary | ICD-10-CM | POA: Diagnosis not present

## 2019-05-28 DIAGNOSIS — E7849 Other hyperlipidemia: Secondary | ICD-10-CM | POA: Diagnosis not present

## 2019-05-28 DIAGNOSIS — R42 Dizziness and giddiness: Secondary | ICD-10-CM

## 2019-05-28 NOTE — Patient Instructions (Signed)
Medication Instructions:  Your physician recommends that you continue on your current medications as directed. Please refer to the Current Medication list given to you today.  *If you need a refill on your cardiac medications before your next appointment, please call your pharmacy*  Lab Work: Your physician recommends that you return for lab work today: bmp   If you have labs (blood work) drawn today and your tests are completely normal, you will receive your results only by: Marland Kitchen MyChart Message (if you have MyChart) OR . A paper copy in the mail If you have any lab test that is abnormal or we need to change your treatment, we will call you to review the results.  Testing/Procedures: Non-Cardiac CT Angiography (CTA), is a special type of CT scan that uses a computer to produce multi-dimensional views of major blood vessels throughout the body. In CT angiography, a contrast material is injected through an IV to help visualize the blood vessels  Your physician has recommended that you have a sleep study. This test records several body functions during sleep, including: brain activity, eye movement, oxygen and carbon dioxide blood levels, heart rate and rhythm, breathing rate and rhythm, the flow of air through your mouth and nose, snoring, body muscle movements, and chest and belly movement.  .A zio monitor was ordered today. It will remain on for 7 days. You will then return monitor and event diary in provided box. It takes 1-2 weeks for report to be downloaded and returned to [place]. We will call you with the results. If monitor falls off or has orange flashing light, please call Zio for further instructions.       Follow-Up: At [facility], you and your health needs are our priority.  As part of our continuing mission to provide you with exceptional heart care, we have created designated Provider Care Teams.  These Care Teams include your primary Cardiologist (physician) and Advanced Practice  Providers (APPs -  Physician Assistants and Nurse Practitioners) who all work together to provide you with the care you need, when you need it.  Your next appointment:   6 week(s)  The format for your next appointment:   In Person  Provider:   Jenne Campus, MD  Other Instructions   Sleep Studies A sleep study (polysomnogram) is a series of tests done while you are sleeping. A sleep study records your brain waves, heart rate, breathing rate, oxygen level, and eye and leg movements. A sleep study helps your health care provider:  See how well you sleep.  Diagnose a sleep disorder.  Determine how severe your sleep disorder is.  Create a plan to treat your sleep disorder. Your health care provider may recommend a sleep study if you:  Feel sleepy on most days.  Snore loudly while sleeping.  Have unusual behaviors while you sleep, such as walking.  Have brief periods in which you stop breathing during sleep (sleepapnea).  Fall asleep suddenly during the day (narcolepsy).  Have trouble falling asleep or staying asleep (insomnia).  Feel like you need to move your legs when trying to fall asleep (restless legs syndrome).  Move your legs by flexing and extending them regularly while asleep (periodic limb movement disorder).  Act out your dreams while you sleep (sleep behavior disorder).  Feel like you cannot move when you first wake up (sleep paralysis). What tests are part of a sleep study? Most sleep studies record the following during sleep:  Brain activity.  Eye movements.  Heart rate and rhythm.  Breathing rate and rhythm.  Blood-oxygen level.  Blood pressure.  Chest and belly movement as you breathe.  Arm and leg movements.  Snoring or other noises.  Body position. Where are sleep studies done? Sleep studies are done at sleep centers. A sleep center may be inside a hospital, office, or clinic. The room where you have the study may look like a  hospital room or a hotel room. The health care providers doing the study may come in and out of the room during the study. Most of the time, they will be in another room monitoring your test as you sleep. How are sleep studies done? Most sleep studies are done during a normal period of time for a full night of sleep. You will arrive at the study center in the evening and go home in the morning. Before the test  Bring your pajamas and toothbrush with you to the sleep study.  Do not have caffeine on the day of your sleep study.  Do not drink alcohol on the day of your sleep study.  Your health care provider will let you know if you should stop taking any of your regular medicines before the test. During the test      Round, sticky patches with sensors attached to recording wires (electrodes) are placed on your scalp, face, chest, and limbs.  Wires from all the electrodes and sensors run from your bed to a computer. The wires can be taken off and put back on if you need to get out of bed to go to the bathroom.  A sensor is placed over your nose to measure airflow.  A finger clip is put on your finger or ear to measure your blood oxygen level (pulse oximetry).  A belt is placed around your belly and a belt is placed around your chest to measure breathing movements.  If you have signs of the sleep disorder called sleep apnea during your test, you may get a treatment mask to wear for the second half of the night. ? The mask provides positive airway pressure (PAP) to help you breathe better during sleep. This may greatly improve your sleep apnea. ? You will then have all tests done again with the mask in place to see if your measurements and recordings change. After the test  A medical doctor who specializes in sleep will evaluate the results of your sleep study and share them with you and your primary health care provider.  Based on your results, your medical history, and a physical exam,  you may be diagnosed with a sleep disorder, such as: ? Sleep apnea. ? Restless legs syndrome. ? Sleep-related behavior disorder. ? Sleep-related movement disorders. ? Sleep-related seizure disorders.  Your health care team will help determine your treatment options based on your diagnosis. This may include: ? Improving your sleep habits (sleep hygiene). ? Wearing a continuous positive airway pressure (CPAP) or bi-level positive airway pressure (BPAP) mask. ? Wearing an oral device at night to improve breathing and reduce snoring. ? Taking medicines. Follow these instructions at home:  Take over-the-counter and prescription medicines only as told by your health care provider.  If you are instructed to use a CPAP or BPAP mask, make sure you use it nightly as directed.  Make any lifestyle changes that your health care provider recommends.  If you were given a device to open your airway while you sleep, use it only as told by your health care  provider.  Do not use any tobacco products, such as cigarettes, chewing tobacco, and e-cigarettes. If you need help quitting, ask your health care provider.  Keep all follow-up visits as told by your health care provider. This is important. Summary  A sleep study (polysomnogram) is a series of tests done while you are sleeping. It shows how well you sleep.  Most sleep studies are done over one full night of sleep. You will arrive at the study center in the evening and go home in the morning.  If you have signs of the sleep disorder called sleep apnea during your test, you may get a treatment mask to wear for the second half of the night.  A medical doctor who specializes in sleep will evaluate the results of your sleep study and share them with your primary health care provider. This information is not intended to replace advice given to you by your health care provider. Make sure you discuss any questions you have with your health care  provider. Document Revised: 09/03/2018 Document Reviewed: 04/16/2017 Elsevier Patient Education  [place].   CT Scan  A CT scan (computed tomography scan) is an imaging scan. It uses X-rays and a computer to make detailed pictures of different areas inside the body. A CT scan can give more information than a regular X-ray exam. A CT scan provides data about internal organs, soft tissue structures, blood vessels, and bones. In this procedure, the pictures will be taken in a large machine that has an opening (CT scanner). Tell a health care provider about:  Any allergies you have.  All medicines you are taking, including vitamins, herbs, eye drops, creams, and over-the-counter medicines.  Any blood disorders you have.  Any surgeries you have had.  Any medical conditions you have.  Whether you are pregnant or may be pregnant. What are the risks? Generally, this is a safe procedure. However, problems may occur, including:  An allergic reaction to dyes.  Development of cancer from excessive exposure to radiation from multiple CT scans. This is rare. What happens before the procedure? Staying hydrated Follow instructions from your health care provider about hydration, which may include:  Up to 2 hours before the procedure - you may continue to drink clear liquids, such as water, clear fruit juice, black coffee, and plain tea. Eating and drinking restrictions Follow instructions from your health care provider about eating and drinking, which may include:  24 hours before the procedure - stop drinking caffeinated beverages, such as energy drinks, tea, soda, coffee, and hot chocolate.  8 hours before the procedure - stop eating heavy meals or foods such as meat, fried foods, or fatty foods.  6 hours before the procedure - stop eating light meals or foods, such as toast or cereal.  6 hours before the procedure - stop drinking milk or drinks that contain milk.  2 hours  before the procedure - stop drinking clear liquids. General instructions  Remove any jewelry.  Ask your health care provider about changing or stopping your regular medicines. This is especially important if you are taking diabetes medicines or blood thinners. What happens during the procedure?  You will lie on a table with your arms above your head.  An IV tube may be inserted into one of your veins.  The contrast dye may be injected into the IV tube. You may feel warm or have a metallic taste in your mouth.  The table you will be lying on will move  into the CT scanner.  You will be able to see, hear, and talk to the person running the machine while you are in it. Follow that person's instructions.  The CT scanner will move around you to take pictures. Do not move while it is scanning. Staying still helps the scanner to get a good image.  When the best possible pictures have been taken, the machine will be turned off. The table will be moved out of the machine.  The IV tube will be removed. The procedure may vary among health care providers and hospitals. What happens after the procedure?  It is up to you to get the results of your procedure. Ask your health care provider, or the department that is doing the procedure, when your results will be ready. Summary  A CT scan is an imaging scan.  A CT scan uses X-rays and a computer to make detailed pictures of different areas of your body.  Follow instructions from your health care provider about eating and drinking before the procedure.  You will be able to see, hear, and talk to the person running the machine while you are in it. Follow that person's instructions. This information is not intended to replace advice given to you by your health care provider. Make sure you discuss any questions you have with your health care provider. Document Revised: 08/04/2018 Document Reviewed: 04/21/2016 Elsevier Patient Education  2020  [employer].

## 2019-05-28 NOTE — Progress Notes (Signed)
Cardiology Office Note:    Date:  05/28/2019   ID:  Marchello, Rohal 1945/07/08, MRN [id number]  PCP:  Lyman Bishop, DO  Cardiologist:  Jenne Campus, MD    Referring MD: Lyman Bishop, DO   No chief complaint on file. I have a problem blood pressure and dizziness  History of Present Illness:    Todd Chan is a 74 y.o. 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  He requested to be seen and he got 2 concerns #1 is the fact that he is dizzy he said he feels unsteady almost all the time.  Also when he walks he will feel the same way.  There is no passing out there is no falls.  He said this sensation has been going on for a while however lately become worse.  He also noted when he gets up quickly he will get some of the sensation but this sensation will remain even minutes after he stood up.  He checked his blood pressure on the regular basis he did notice some fluctuation of his blood pressure but majority of time is good.  He also check orthostatic changes and no significant changes.  Also described to have some shortness of breath but I understand that that is being aggressively investigated including visit in pulmonary.  He was told that everything is fine.  Denies having a chest pain tightness squeezing pressure burning chest.  There is some swelling of lower extremities but it is chronic.  Past Medical History:  Diagnosis Date  . Arthritis   . GERD (gastroesophageal reflux disease)   . Hypertension   . Osteoarthritis of knee   . Painful urination   . Peripheral vascular disease ([place])    bilateral edema     Past Surgical History:  Procedure Laterality Date  . CARDIAC CATHETERIZATION  04/2017  . ESOPHAGOGASTRODUODENOSCOPY ENDOSCOPY     8 days ago  . KNEE ARTHROSCOPY Left 6-7 years ago  . LEFT  HEART CATH AND CORONARY ANGIOGRAPHY N/A 04/17/2017   Procedure: LEFT HEART CATH AND CORONARY ANGIOGRAPHY;  Surgeon: Troy Sine, MD;  Location: [facility];  Service: Cardiovascular;  Laterality: N/A;  . 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 TONSILLECTOMY    . TOTAL KNEE ARTHROPLASTY Left 10/08/2016   Procedure: LEFT TOTAL KNEE ARTHROPLASTY;  Surgeon: Gaynelle Arabian, MD;  Location: [facility];  Service: Orthopedics;  Laterality: Left;  . TOTAL KNEE ARTHROPLASTY Right 06/17/2017   Procedure: RIGHT TOTAL KNEE ARTHROPLASTY;  Surgeon: Gaynelle Arabian, MD;  Location: [facility];  Service: Orthopedics;  Laterality: Right;    Current Medications: Current Meds  Medication Sig  . aspirin EC 81 MG tablet Take 81 mg by mouth daily.  . Cholecalciferol (VITAMIN D3) 50 MCG (2000 UT) capsule Take 2,000 Units by mouth daily.  Marland Kitchen ezetimibe (ZETIA) 10 MG tablet Take 5 mg by mouth daily.  . metoprolol tartrate (LOPRESSOR) 25 MG tablet TAKE 1 TABLET TWICE A DAY  . Multiple Vitamin (MULTIVITAMIN) capsule Take 1 capsule by mouth daily.  . Omega-3 Fatty Acids (FISH OIL) 1000 MG CAPS Take 1,000 mg by mouth daily.  . pantoprazole (PROTONIX) 40 MG tablet Take 1 tablet (40 mg total) by mouth daily.  . pravastatin (PRAVACHOL) 20 MG tablet TAKE 1 TABLET BY MOUTH EVERY DAY IN THE EVENING  . telmisartan-hydrochlorothiazide (MICARDIS HCT) 40-12.5  MG tablet TAKE 1 TABLET DAILY  . [DISCONTINUED] ezetimibe (ZETIA) 10 MG tablet Take 1 tablet (10 mg total) by mouth daily.     Allergies:   Patient has no known allergies.   Social History   Socioeconomic History  . Marital status: Married    Spouse name: Not on file  . Number of children: 2  . Years of education: Not on file  . Highest education level: Bachelor's degree (e.g., BA, AB, BS)  Occupational History  . Not on file  Tobacco Use  . Smoking status: Former Smoker    Packs/day: 1.00    Years: 20.00    Pack years: 20.00    Quit date: 10/1987    Years since  quitting: 31.6  . Smokeless tobacco: Never Used  . Tobacco comment: quit 30 yeara ago   Substance and Sexual Activity  . Alcohol use: Yes    Comment: 3 drinks per day- wine and scotch  . Drug use: No  . Sexual activity: Yes  Other Topics Concern  . Not on file  Social History Narrative   Lives at home with his wife   Left handed   Drinks 2-3 cups of caffeine daily   Social Determinants of Health   Financial Resource Strain:   . Difficulty of Paying Living Expenses: Not on file  Food Insecurity:   . Worried About Charity fundraiser in the Last Year: Not on file  . Ran Out of Food in the Last Year: Not on file  Transportation Needs:   . Lack of Transportation (Medical): Not on file  . Lack of Transportation (Non-Medical): Not on file  Physical Activity:   . Days of Exercise per Week: Not on file  . Minutes of Exercise per Session: Not on file  Stress:   . Feeling of Stress : Not on file  Social Connections:   . Frequency of Communication with Friends and Family: Not on file  . Frequency of Social Gatherings with Friends and Family: Not on file  . Attends Religious Services: Not on file  . Active Member of Clubs or Organizations: Not on file  . Attends Archivist Meetings: Not on file  . Marital Status: Not on file     Family History: The patient's family history includes Breast cancer (age of onset: 42) in his sister; Breast cancer (age of onset: 14) in his mother; Prostate cancer in his father. There is no history of Neuropathy. ROS:   Please see the history of present illness.    All 14 point review of systems negative except as described per history of present illness  EKGs/Labs/Other Studies Reviewed:      Recent Labs: 04/09/2019: ALT 28; BUN 14; Creatinine, Ser 0.93; Potassium 4.1; Sodium 139  Recent Lipid Panel    Component Value Date/Time   CHOL 113 05/11/2019 1156   TRIG 60 05/11/2019 1156   HDL 50 05/11/2019 1156   CHOLHDL 2.3 05/11/2019 1156     LDLCALC 50 05/11/2019 1156    Physical Exam:    VS:  BP 126/82   Pulse 100   Ht 6' (1.829 m)   Wt 285 lb 12.8 oz (129.6 kg)   SpO2 98%   BMI 38.76 kg/m     Wt Readings from Last 3 Encounters:  05/28/19 285 lb 12.8 oz (129.6 kg)  05/11/19 293 lb 1.9 oz (133 kg)  04/09/19 300 lb (136.1 kg)     GEN:  Well nourished, well developed in  no acute distress HEENT: Normal NECK: No JVD; No carotid bruits LYMPHATICS: No lymphadenopathy CARDIAC: RRR, no murmurs, no rubs, no gallops RESPIRATORY:  Clear to auscultation without rales, wheezing or rhonchi  ABDOMEN: Soft, non-tender, non-distended MUSCULOSKELETAL:  No edema; No deformity  SKIN: Warm and dry LOWER EXTREMITIES: no swelling NEUROLOGIC:  Alert and oriented x 3 PSYCHIATRIC:  Normal affect   ASSESSMENT:    1. Mild CAD   2. Essential hypertension   3. Other hyperlipidemia   4. Exertional dyspnea   5. Dizziness   6. Ascending aortic aneurysm (HCC) 4.2 cm on CT from January 2020   7. Snoring    PLAN:    In order of problems listed above:  1. Mild CAD.  Noted risk factors modified.  He is on aspirin as well as Zetia and statin with excellent cholesterol profile. 2. Essential hypertension his blood pressures well controlled I will not change any of his medication at the moment. 3. Dyspnea.  Quite extensive evaluation has been done which been negative.  He try to exercise on a regular basis, he does have stationary bike and use it on a regular basis. 4. Ascending aortic aneurysm last checked January 2020.  He apparently wanted to postpone it until the second coronavirus vaccine that he received about 2 weeks ago.  I will schedule him to have CT Angie of his chest to look at the aneurysm. 5. Snoring he did have multiple discussion with multiple doctors with suggestion to proceed with sleep apnea study.  He agree.  I will schedule him to have the test done. 6. In terms of fatigue and dizziness.  I will ask him to wear Zio patch  for a week to make sure his heart rate is appropriate also will make sure chronotropic response is good as well.  We will do EKG as well today.   Medication Adjustments/Labs and Tests Ordered: Current medicines are reviewed at length with the patient today.  Concerns regarding medicines are outlined above.  No orders of the defined types were placed in this encounter.  Medication changes: No orders of the defined types were placed in this encounter.   Signed, Park Liter, MD, [facility] 05/28/2019 10:42 AM    [place]

## 2019-05-28 NOTE — Addendum Note (Signed)
Addended by: Juventino Slovak on: 05/28/2019 10:54 AM   Modules accepted: Orders

## 2019-05-28 NOTE — Addendum Note (Signed)
Addended by: Ashok Norris on: 05/28/2019 11:07 AM   Modules accepted: Orders

## 2019-05-29 LAB — BASIC METABOLIC PANEL
BUN/Creatinine Ratio: 14 (ref 10–24)
BUN: 13 mg/dL (ref 8–27)
CO2: 25 mmol/L (ref 20–29)
Calcium: 10 mg/dL (ref 8.6–10.2)
Chloride: 99 mmol/L (ref 96–106)
Creatinine, Ser: 0.96 mg/dL (ref 0.76–1.27)
GFR calc Af Amer: 92 mL/min/{1.73_m2} (ref >59)
GFR calc non Af Amer: 79 mL/min/{1.73_m2} (ref >59)
Glucose: 120 mg/dL — ABNORMAL HIGH (ref 65–99)
Potassium: 4.6 mmol/L (ref 3.5–5.2)
Sodium: 142 mmol/L (ref 134–144)

## 2019-06-01 ENCOUNTER — Telehealth: Payer: Self-pay | Admitting: *Deleted

## 2019-06-01 NOTE — Telephone Encounter (Signed)
Staff message sent to [facility] patient does not need PA for sleep study. Ok to schedule.

## 2019-06-02 ENCOUNTER — Other Ambulatory Visit: Payer: Self-pay

## 2019-06-02 ENCOUNTER — Ambulatory Visit (INDEPENDENT_AMBULATORY_CARE_PROVIDER_SITE_OTHER): Payer: Medicare Other

## 2019-06-02 ENCOUNTER — Ambulatory Visit (HOSPITAL_BASED_OUTPATIENT_CLINIC_OR_DEPARTMENT_OTHER)
Admission: RE | Admit: 2019-06-02 | Discharge: 2019-06-02 | Disposition: A | Discharge status: Home / Self Care | Payer: Medicare Other | Source: Ambulatory Visit | Attending: Cardiology | Admitting: Cardiology

## 2019-06-02 ENCOUNTER — Encounter (HOSPITAL_BASED_OUTPATIENT_CLINIC_OR_DEPARTMENT_OTHER): Payer: Self-pay

## 2019-06-02 ENCOUNTER — Telehealth: Payer: Self-pay | Admitting: Emergency Medicine

## 2019-06-02 DIAGNOSIS — I712 Thoracic aortic aneurysm, without rupture: Secondary | ICD-10-CM | POA: Diagnosis not present

## 2019-06-02 DIAGNOSIS — R42 Dizziness and giddiness: Secondary | ICD-10-CM | POA: Diagnosis not present

## 2019-06-02 MED ORDER — IOHEXOL 350 MG/ML SOLN
100.0000 mL | Freq: Once | INTRAVENOUS | Status: AC | PRN
  Administered 2019-06-02: 100 mL via INTRAVENOUS

## 2019-06-02 NOTE — Telephone Encounter (Signed)
Called patient and informed him of sleep study appointment and covid test appointment. Patient verbally understood. No further questions.

## 2019-06-03 ENCOUNTER — Other Ambulatory Visit (HOSPITAL_BASED_OUTPATIENT_CLINIC_OR_DEPARTMENT_OTHER): Payer: Medicare Other

## 2019-06-15 IMAGING — NM NM MISC PROCEDURE
3 series · 18 of 18 positions shown · non-contrast
Comparison: none

[Series 1: stress-gsp_(id)_sa · 6.4mm · 6.40mm/px · 6 of 512 frames shown]
[frame 43/512]
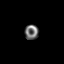
[frame 128/512]
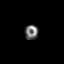
[frame 214/512]
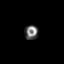
[frame 299/512]
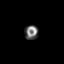
[frame 384/512]
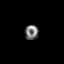
[frame 470/512]
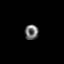

[Series 1: stress-sum-em_(id)_sa · 6.4mm · 6.40mm/px · 6 of 64 frames shown]
[frame 6/64]
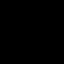
[frame 16/64]
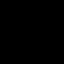
[frame 27/64]
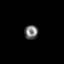
[frame 38/64]
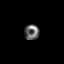
[frame 48/64]
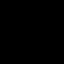
[frame 59/64]
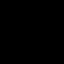

[Series 1: rest_(id)_sa · 6.4mm · 6.40mm/px · 6 of 64 frames shown]
[frame 6/64]
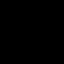
[frame 16/64]
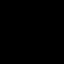
[frame 27/64]
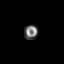
[frame 38/64]
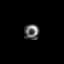
[frame 48/64]
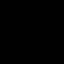
[frame 59/64]
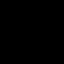

[18 of 18 positions shown; findings below may reference images not displayed]

Canned report from images found in remote index.

Refer to host system for actual result text.

## 2019-06-16 ENCOUNTER — Other Ambulatory Visit (HOSPITAL_COMMUNITY): Payer: Medicare Other

## 2019-06-16 DIAGNOSIS — R42 Dizziness and giddiness: Secondary | ICD-10-CM | POA: Diagnosis not present

## 2019-06-16 DIAGNOSIS — M9902 Segmental and somatic dysfunction of thoracic region: Secondary | ICD-10-CM | POA: Diagnosis not present

## 2019-06-16 DIAGNOSIS — M5134 Other intervertebral disc degeneration, thoracic region: Secondary | ICD-10-CM | POA: Diagnosis not present

## 2019-06-16 DIAGNOSIS — M5033 Other cervical disc degeneration, cervicothoracic region: Secondary | ICD-10-CM | POA: Diagnosis not present

## 2019-06-16 DIAGNOSIS — M9901 Segmental and somatic dysfunction of cervical region: Secondary | ICD-10-CM | POA: Diagnosis not present

## 2019-06-18 ENCOUNTER — Encounter (HOSPITAL_BASED_OUTPATIENT_CLINIC_OR_DEPARTMENT_OTHER): Payer: Medicare Other | Admitting: Cardiovascular Disease

## 2019-06-18 DIAGNOSIS — M9902 Segmental and somatic dysfunction of thoracic region: Secondary | ICD-10-CM | POA: Diagnosis not present

## 2019-06-18 DIAGNOSIS — M9901 Segmental and somatic dysfunction of cervical region: Secondary | ICD-10-CM | POA: Diagnosis not present

## 2019-06-18 DIAGNOSIS — M5134 Other intervertebral disc degeneration, thoracic region: Secondary | ICD-10-CM | POA: Diagnosis not present

## 2019-06-18 DIAGNOSIS — M5033 Other cervical disc degeneration, cervicothoracic region: Secondary | ICD-10-CM | POA: Diagnosis not present

## 2019-06-22 DIAGNOSIS — M9901 Segmental and somatic dysfunction of cervical region: Secondary | ICD-10-CM | POA: Diagnosis not present

## 2019-06-22 DIAGNOSIS — M5134 Other intervertebral disc degeneration, thoracic region: Secondary | ICD-10-CM | POA: Diagnosis not present

## 2019-06-22 DIAGNOSIS — M5033 Other cervical disc degeneration, cervicothoracic region: Secondary | ICD-10-CM | POA: Diagnosis not present

## 2019-06-22 DIAGNOSIS — M9902 Segmental and somatic dysfunction of thoracic region: Secondary | ICD-10-CM | POA: Diagnosis not present

## 2019-06-24 DIAGNOSIS — K2270 Barrett's esophagus without dysplasia: Secondary | ICD-10-CM | POA: Diagnosis not present

## 2019-06-24 DIAGNOSIS — Z86010 Personal history of colonic polyps: Secondary | ICD-10-CM | POA: Diagnosis not present

## 2019-06-25 DIAGNOSIS — M9902 Segmental and somatic dysfunction of thoracic region: Secondary | ICD-10-CM | POA: Diagnosis not present

## 2019-06-25 DIAGNOSIS — M9901 Segmental and somatic dysfunction of cervical region: Secondary | ICD-10-CM | POA: Diagnosis not present

## 2019-06-25 DIAGNOSIS — M5033 Other cervical disc degeneration, cervicothoracic region: Secondary | ICD-10-CM | POA: Diagnosis not present

## 2019-06-25 DIAGNOSIS — M5134 Other intervertebral disc degeneration, thoracic region: Secondary | ICD-10-CM | POA: Diagnosis not present

## 2019-06-29 DIAGNOSIS — M5033 Other cervical disc degeneration, cervicothoracic region: Secondary | ICD-10-CM | POA: Diagnosis not present

## 2019-06-29 DIAGNOSIS — M9901 Segmental and somatic dysfunction of cervical region: Secondary | ICD-10-CM | POA: Diagnosis not present

## 2019-06-29 DIAGNOSIS — M9902 Segmental and somatic dysfunction of thoracic region: Secondary | ICD-10-CM | POA: Diagnosis not present

## 2019-06-29 DIAGNOSIS — M5134 Other intervertebral disc degeneration, thoracic region: Secondary | ICD-10-CM | POA: Diagnosis not present

## 2019-07-02 DIAGNOSIS — M5033 Other cervical disc degeneration, cervicothoracic region: Secondary | ICD-10-CM | POA: Diagnosis not present

## 2019-07-02 DIAGNOSIS — M5134 Other intervertebral disc degeneration, thoracic region: Secondary | ICD-10-CM | POA: Diagnosis not present

## 2019-07-02 DIAGNOSIS — M9901 Segmental and somatic dysfunction of cervical region: Secondary | ICD-10-CM | POA: Diagnosis not present

## 2019-07-02 DIAGNOSIS — M9902 Segmental and somatic dysfunction of thoracic region: Secondary | ICD-10-CM | POA: Diagnosis not present

## 2019-07-07 NOTE — Progress Notes (Signed)
Cardiology Office Note:    Date:  07/08/2019   ID:  Todd Chan, DOB [birth year], MRN [id number]  PCP:  Lyman Bishop, DO  Cardiologist:  Shirlee More, MD    Referring MD: Lyman Bishop, DO    ASSESSMENT:    1. Dizziness   2. Ascending aortic aneurysm (HCC) 4.2 cm on CT from January 2020   3. Essential hypertension   4. 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 Epistaxis    PLAN:    In order of problems listed above:  1. Clinically I suspect this problem is in her ear is some element of vertigo not related to hypertension arrhythmia and with his concerns of epistasis advised him to seek ENT consultation 2. Stable ascending aortic enlargement recheck CT in 1 year 3. 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 Melene Plan neurology as a consultant   Next appointment: 1 year   Medication Adjustments/Labs and Tests Ordered: Current medicines are reviewed at length with the patient today.  Concerns regarding medicines are outlined above.  Orders Placed This Encounter  Procedures  . CT Chest Wo Contrast  . Ambulatory referral to Neurology   No orders of the defined types were placed in this encounter.   Chief Complaint  Patient presents with  . Follow-up    History of Present Illness:    Todd Chan is a 74 y.o. male with a hx of  mild CAD  with mild 10 to 15% luminal irregularity of the proximal LAD coronary angiography October 2019 and enlargement of the ascending  thoracic aorta last seen 05/28/2019 by my partner Dr. Agustin Cree.Marland Kitchen  His most recent CT of chest 06/02/2019 shows stable enlargement ascending aorta 41 mm and evidence of gynecomastia and the problem has been looked at in the past by his primary care physician.  With lightheadedness he utilized an ambulatory event monitor for 6 days showing no  clinical arrhythmia of note and no association of lightheadedness and dizziness with arrhythmia.  He has been short of breath and the chest x-ray showed areas of atelectasis or scarring in the right lung and right diaphragmatic hernia. Compliance with diet, lifestyle and medications: Yes  There are a lot of issues that I think are predominantly not cardiac.  The first is tremor and request a referral to neurology for that along with what sounds like neuropathy in the lower extremities.  The secondary shortness of breath and I suspect is pulmonary with scarring on the CT and diaphragmatic hernia.  His next problem is lightheadedness some vertigo associated with this clearly positional but unassociated with significant change in blood pressure or arrhythmia.  The last is his thoracic aorta unchanged I doubt he will ever need intervention and I will plan to do a CT in 1 year and see him a week afterwards.  He is not having angina edema orthopnea or syncope.  He has no drop in blood pressure standing.  Home blood pressures run less than 140/90 consistently.  This is at target for him. Past Medical History:  Diagnosis Date  . Arthritis   . GERD (gastroesophageal reflux disease)   . Hypertension   . Osteoarthritis of knee   . Painful urination   . Peripheral vascular disease ([place])    bilateral edema     Past Surgical History:  Procedure Laterality  Date  . CARDIAC CATHETERIZATION  04/2017  . ESOPHAGOGASTRODUODENOSCOPY ENDOSCOPY     8 days ago  . KNEE ARTHROSCOPY Left 6-7 years ago  . LEFT HEART CATH AND CORONARY ANGIOGRAPHY N/A 04/17/2017   Procedure: LEFT HEART CATH AND CORONARY ANGIOGRAPHY;  Surgeon: Troy Sine, MD;  Location: [facility];  Service: Cardiovascular;  Laterality: N/A;  . 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 TONSILLECTOMY    . TOTAL KNEE ARTHROPLASTY Left 10/08/2016   Procedure: LEFT TOTAL KNEE ARTHROPLASTY;  Surgeon: Gaynelle Arabian, MD;  Location: [facility];  Service:  Orthopedics;  Laterality: Left;  . TOTAL KNEE ARTHROPLASTY Right 06/17/2017   Procedure: RIGHT TOTAL KNEE ARTHROPLASTY;  Surgeon: Gaynelle Arabian, MD;  Location: [facility];  Service: Orthopedics;  Laterality: Right;    Current Medications: Current Meds  Medication Sig  . aspirin EC 81 MG tablet Take 81 mg by mouth daily.  . Cholecalciferol (VITAMIN D3) 50 MCG (2000 UT) capsule Take 2,000 Units by mouth daily.  Marland Kitchen ezetimibe (ZETIA) 10 MG tablet Take 5 mg by mouth daily.  . metoprolol tartrate (LOPRESSOR) 25 MG tablet TAKE 1 TABLET TWICE A DAY  . Multiple Vitamin (MULTIVITAMIN) capsule Take 1 capsule by mouth daily.  . Omega-3 Fatty Acids (FISH OIL) 1000 MG CAPS Take 1,000 mg by mouth daily.  . pantoprazole (PROTONIX) 40 MG tablet Take 1 tablet (40 mg total) by mouth daily.  . pravastatin (PRAVACHOL) 20 MG tablet TAKE 1 TABLET BY MOUTH EVERY DAY IN THE EVENING  . telmisartan-hydrochlorothiazide (MICARDIS HCT) 40-12.5 MG tablet TAKE 1 TABLET DAILY     Allergies:   Patient has no known allergies.   Social History   Socioeconomic History  . Marital status: Married    Spouse name: Not on file  . Number of children: 2  . Years of education: Not on file  . Highest education level: Bachelor's degree (e.g., BA, AB, BS)  Occupational History  . Not on file  Tobacco Use  . Smoking status: Former Smoker    Packs/day: 1.00    Years: 20.00    Pack years: 20.00    Quit date: 10/1987    Years since quitting: 31.7  . Smokeless tobacco: Never Used  . Tobacco comment: quit 30 yeara ago   Substance and Sexual Activity  . Alcohol use: Yes    Comment: 3 drinks per day- wine and scotch  . Drug use: No  . Sexual activity: Yes  Other Topics Concern  . Not on file  Social History Narrative   Lives at home with his wife   Left handed   Drinks 2-3 cups of caffeine daily   Social Determinants of Health   Financial Resource Strain:   . Difficulty of Paying Living Expenses:   Food Insecurity:   .  Worried About Charity fundraiser in the Last Year:   . Arboriculturist in the Last Year:   Transportation Needs:   . Film/video editor (Medical):   Marland Kitchen Lack of Transportation (Non-Medical):   Physical Activity:   . Days of Exercise per Week:   . Minutes of Exercise per Session:   Stress:   . Feeling of Stress :   Social Connections:   . Frequency of Communication with Friends and Family:   . Frequency of Social Gatherings with Friends and Family:   . Attends Religious Services:   . Active Member of Clubs or Organizations:   . Attends Archivist Meetings:   .  Marital Status:      Family History: The patient's family history includes Breast cancer (age of onset: 35) in his sister; Breast cancer (age of onset: 32) in his mother; Prostate cancer in his father. There is no history of Neuropathy. ROS:   Please see the history of present illness.    All other systems reviewed and are negative.  EKGs/Labs/Other Studies Reviewed:    The following studies were reviewed today:  I reviewed his monitor and CT with the patient during the visit Recent Labs: 04/09/2019: ALT 28 05/28/2019: BUN 13; Creatinine, Ser 0.96; Potassium 4.6; Sodium 142  Recent Lipid Panel his lipids are ideal with an LDL of 50    Component Value Date/Time   CHOL 113 05/11/2019 1156   TRIG 60 05/11/2019 1156   HDL 50 05/11/2019 1156   CHOLHDL 2.3 05/11/2019 1156   LDLCALC 50 05/11/2019 1156    Physical Exam:    VS:  BP (!) 146/90   Pulse 80   Temp 97.7 F (36.5 C)   Ht 6' (1.829 m)   Wt 280 lb (127 kg)   SpO2 97%   BMI 37.97 kg/m     Wt Readings from Last 3 Encounters:  07/08/19 280 lb (127 kg)  05/28/19 285 lb 12.8 oz (129.6 kg)  05/11/19 293 lb 1.9 oz (133 kg)     GEN:  Well nourished, well developed in no acute distress HEENT: Normal NECK: No JVD; No carotid bruits LYMPHATICS: No lymphadenopathy CARDIAC: RRR, no murmurs, rubs, gallops RESPIRATORY:  Clear to auscultation without  rales, wheezing or rhonchi  ABDOMEN: Soft, non-tender, non-distended MUSCULOSKELETAL:  No edema; No deformity  SKIN: Warm and dry NEUROLOGIC:  Alert and oriented x 3 PSYCHIATRIC:  Normal affect    Signed, Shirlee More, MD  07/08/2019 11:33 AM    [place]

## 2019-07-08 ENCOUNTER — Other Ambulatory Visit: Payer: Self-pay

## 2019-07-08 ENCOUNTER — Ambulatory Visit (INDEPENDENT_AMBULATORY_CARE_PROVIDER_SITE_OTHER): Payer: Medicare Other | Admitting: Cardiology

## 2019-07-08 ENCOUNTER — Encounter: Payer: Self-pay | Admitting: Cardiology

## 2019-07-08 VITALS — BP 146/90 | HR 80 | Temp 97.7°F | Ht 72.0 in | Wt 280.0 lb

## 2019-07-08 DIAGNOSIS — I10 Essential (primary) hypertension: Secondary | ICD-10-CM

## 2019-07-08 DIAGNOSIS — R42 Dizziness and giddiness: Secondary | ICD-10-CM | POA: Diagnosis not present

## 2019-07-08 DIAGNOSIS — I712 Thoracic aortic aneurysm, without rupture, unspecified: Secondary | ICD-10-CM

## 2019-07-08 DIAGNOSIS — I7121 Aneurysm of the ascending aorta, without rupture: Secondary | ICD-10-CM

## 2019-07-08 DIAGNOSIS — R040 Epistaxis: Secondary | ICD-10-CM

## 2019-07-08 NOTE — Patient Instructions (Addendum)
Medication Instructions:  Your physician recommends that you continue on your current medications as directed. Please refer to the Current Medication list given to you today.  *If you need a refill on your cardiac medications before your next appointment, please call your pharmacy*   Lab Work: None If you have labs (blood work) drawn today and your tests are completely normal, you will receive your results only by: Marland Kitchen MyChart Message (if you have MyChart) OR . A paper copy in the mail If you have any lab test that is abnormal or we need to change your treatment, we will call you to review the results.   Testing/Procedures: CT-scan of the chest in one year. Please have this done prior to your scheduled office visit In one year.      Follow-Up: At [facility], you and your health needs are our priority.  As part of our continuing mission to provide you with exceptional heart care, we have created designated Provider Care Teams.  These Care Teams include your primary Cardiologist (physician) and Advanced Practice Providers (APPs -  Physician Assistants and Nurse Practitioners) who all work together to provide you with the care you need, when you need it.  We recommend signing up for the patient portal called "MyChart".  Sign up information is provided on this After Visit Summary.  MyChart is used to connect with patients for Virtual Visits (Telemedicine).  Patients are able to view lab/test results, encounter notes, upcoming appointments, etc.  Non-urgent messages can be sent to your provider as well.   To learn more about what you can do with MyChart, go to [email or web address].    Your next appointment:   1 year(s)  The format for your next appointment:   In Person  Provider:   Shirlee More, MD   Other Instructions

## 2019-07-09 DIAGNOSIS — M5134 Other intervertebral disc degeneration, thoracic region: Secondary | ICD-10-CM | POA: Diagnosis not present

## 2019-07-09 DIAGNOSIS — M9901 Segmental and somatic dysfunction of cervical region: Secondary | ICD-10-CM | POA: Diagnosis not present

## 2019-07-09 DIAGNOSIS — M5033 Other cervical disc degeneration, cervicothoracic region: Secondary | ICD-10-CM | POA: Diagnosis not present

## 2019-07-09 DIAGNOSIS — M9902 Segmental and somatic dysfunction of thoracic region: Secondary | ICD-10-CM | POA: Diagnosis not present

## 2019-07-13 ENCOUNTER — Telehealth: Payer: Self-pay | Admitting: Neurology

## 2019-07-13 NOTE — Telephone Encounter (Signed)
Patient has been referred back to [place] from Dr. Bettina Gavia for the tremor and neuropathy. He has seen Dr. Jaynee Eagles in the past (2019), but is requesting to switch to Dr. Felecia Shelling. Would you both be ok with this?

## 2019-07-13 NOTE — Telephone Encounter (Signed)
Fine with me - thanks!!

## 2019-07-13 NOTE — Telephone Encounter (Signed)
I would be fine with that if that is what the patient wants.

## 2019-07-14 DIAGNOSIS — Z1389 Encounter for screening for other disorder: Secondary | ICD-10-CM | POA: Diagnosis not present

## 2019-07-14 DIAGNOSIS — G629 Polyneuropathy, unspecified: Secondary | ICD-10-CM | POA: Diagnosis not present

## 2019-07-14 DIAGNOSIS — G250 Essential tremor: Secondary | ICD-10-CM | POA: Diagnosis not present

## 2019-07-14 DIAGNOSIS — Z86010 Personal history of colonic polyps: Secondary | ICD-10-CM | POA: Diagnosis not present

## 2019-07-14 DIAGNOSIS — Z23 Encounter for immunization: Secondary | ICD-10-CM | POA: Diagnosis not present

## 2019-07-14 DIAGNOSIS — M179 Osteoarthritis of knee, unspecified: Secondary | ICD-10-CM | POA: Diagnosis not present

## 2019-07-14 DIAGNOSIS — E785 Hyperlipidemia, unspecified: Secondary | ICD-10-CM | POA: Diagnosis not present

## 2019-07-14 DIAGNOSIS — I712 Thoracic aortic aneurysm, without rupture: Secondary | ICD-10-CM | POA: Diagnosis not present

## 2019-07-14 DIAGNOSIS — Z0000 Encounter for general adult medical examination without abnormal findings: Secondary | ICD-10-CM | POA: Diagnosis not present

## 2019-07-14 DIAGNOSIS — I10 Essential (primary) hypertension: Secondary | ICD-10-CM | POA: Diagnosis not present

## 2019-07-14 DIAGNOSIS — K219 Gastro-esophageal reflux disease without esophagitis: Secondary | ICD-10-CM | POA: Diagnosis not present

## 2019-07-14 DIAGNOSIS — N401 Enlarged prostate with lower urinary tract symptoms: Secondary | ICD-10-CM | POA: Diagnosis not present

## 2019-07-15 DIAGNOSIS — M79671 Pain in right foot: Secondary | ICD-10-CM | POA: Diagnosis not present

## 2019-07-16 DIAGNOSIS — M5134 Other intervertebral disc degeneration, thoracic region: Secondary | ICD-10-CM | POA: Diagnosis not present

## 2019-07-16 DIAGNOSIS — M5033 Other cervical disc degeneration, cervicothoracic region: Secondary | ICD-10-CM | POA: Diagnosis not present

## 2019-07-16 DIAGNOSIS — M9902 Segmental and somatic dysfunction of thoracic region: Secondary | ICD-10-CM | POA: Diagnosis not present

## 2019-07-16 DIAGNOSIS — M9901 Segmental and somatic dysfunction of cervical region: Secondary | ICD-10-CM | POA: Diagnosis not present

## 2019-07-23 DIAGNOSIS — Z1159 Encounter for screening for other viral diseases: Secondary | ICD-10-CM | POA: Diagnosis not present

## 2019-07-27 DIAGNOSIS — Z86010 Personal history of colonic polyps: Secondary | ICD-10-CM | POA: Diagnosis not present

## 2019-07-27 DIAGNOSIS — K2270 Barrett's esophagus without dysplasia: Secondary | ICD-10-CM | POA: Diagnosis not present

## 2019-07-27 DIAGNOSIS — K229 Disease of esophagus, unspecified: Secondary | ICD-10-CM | POA: Diagnosis not present

## 2019-07-27 DIAGNOSIS — K449 Diaphragmatic hernia without obstruction or gangrene: Secondary | ICD-10-CM | POA: Diagnosis not present

## 2019-07-27 DIAGNOSIS — K2100 Gastro-esophageal reflux disease with esophagitis, without bleeding: Secondary | ICD-10-CM | POA: Diagnosis not present

## 2019-07-30 DIAGNOSIS — M79671 Pain in right foot: Secondary | ICD-10-CM | POA: Diagnosis not present

## 2019-07-31 DIAGNOSIS — K2100 Gastro-esophageal reflux disease with esophagitis, without bleeding: Secondary | ICD-10-CM | POA: Diagnosis not present

## 2019-08-07 ENCOUNTER — Telehealth: Payer: Self-pay

## 2019-08-07 ENCOUNTER — Other Ambulatory Visit: Payer: Self-pay | Admitting: Cardiology

## 2019-08-07 NOTE — Telephone Encounter (Signed)
Spoke to patient on the phone in regards to his MyChart message and Dr. Joya Gaskins recommendation to him.   No other issues or concerns were noted.    Encouraged patient to call back with any questions or concerns.

## 2019-08-24 ENCOUNTER — Ambulatory Visit: Payer: Medicare Other | Admitting: Podiatry

## 2019-08-25 ENCOUNTER — Other Ambulatory Visit: Payer: Self-pay

## 2019-08-25 ENCOUNTER — Encounter: Payer: Self-pay | Admitting: Podiatry

## 2019-08-25 ENCOUNTER — Ambulatory Visit (INDEPENDENT_AMBULATORY_CARE_PROVIDER_SITE_OTHER): Payer: Medicare Other | Admitting: Podiatry

## 2019-08-25 ENCOUNTER — Ambulatory Visit (INDEPENDENT_AMBULATORY_CARE_PROVIDER_SITE_OTHER): Payer: Medicare Other

## 2019-08-25 DIAGNOSIS — M778 Other enthesopathies, not elsewhere classified: Secondary | ICD-10-CM | POA: Diagnosis not present

## 2019-08-25 DIAGNOSIS — M7989 Other specified soft tissue disorders: Secondary | ICD-10-CM

## 2019-08-25 DIAGNOSIS — I2510 Atherosclerotic heart disease of native coronary artery without angina pectoris: Secondary | ICD-10-CM | POA: Diagnosis not present

## 2019-08-25 DIAGNOSIS — I872 Venous insufficiency (chronic) (peripheral): Secondary | ICD-10-CM

## 2019-08-25 DIAGNOSIS — G5791 Unspecified mononeuropathy of right lower limb: Secondary | ICD-10-CM | POA: Diagnosis not present

## 2019-08-26 ENCOUNTER — Encounter: Payer: Self-pay | Admitting: Podiatry

## 2019-08-26 NOTE — Progress Notes (Signed)
Subjective:  Patient ID: Todd Chan, male    DOB: 07-31-1945,  MRN: [id number] HPI Chief Complaint  Patient presents with  . Foot Pain    Dorsal forefoot right - aching, tingling, numbness x 6-8 weeks, some swelling, no injury, PCP evaluated and referred here and to neurology to have checked-sees neuro in 2 weeks  . New Patient (Initial Visit)    74 y.o. male presents with the above complaint.   ROS: Denies fever chills nausea vomiting muscle aches pains calf pain back pain chest pain shortness of breath.  Past Medical History:  Diagnosis Date  . Arthritis   . GERD (gastroesophageal reflux disease)   . Hypertension   . Osteoarthritis of knee   . Painful urination   . Peripheral vascular disease ([place])    bilateral edema    Past Surgical History:  Procedure Laterality Date  . CARDIAC CATHETERIZATION  04/2017  . ESOPHAGOGASTRODUODENOSCOPY ENDOSCOPY     8 days ago  . KNEE ARTHROSCOPY Left 6-7 years ago  . LEFT HEART CATH AND CORONARY ANGIOGRAPHY N/A 04/17/2017   Procedure: LEFT HEART CATH AND CORONARY ANGIOGRAPHY;  Surgeon: Troy Sine, MD;  Location: [facility];  Service: Cardiovascular;  Laterality: N/A;  . 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 TONSILLECTOMY    . TOTAL KNEE ARTHROPLASTY Left 10/08/2016   Procedure: LEFT TOTAL KNEE ARTHROPLASTY;  Surgeon: Gaynelle Arabian, MD;  Location: [facility];  Service: Orthopedics;  Laterality: Left;  . TOTAL KNEE ARTHROPLASTY Right 06/17/2017   Procedure: RIGHT TOTAL KNEE ARTHROPLASTY;  Surgeon: Gaynelle Arabian, MD;  Location: [facility];  Service: Orthopedics;  Laterality: Right;    Current Outpatient Medications:  .  aspirin EC 81 MG tablet, Take 81 mg by mouth daily., Disp: , Rfl:  .  Cholecalciferol (VITAMIN D3) 50 MCG (2000 UT) capsule, Take 2,000 Units by mouth daily., Disp: , Rfl:  .  ezetimibe (ZETIA) 10 MG tablet, Take 5 mg by mouth daily., Disp: , Rfl:  .  metoprolol tartrate (LOPRESSOR) 25 MG tablet, TAKE 1 TABLET  TWICE A DAY, Disp: 180 tablet, Rfl: 1 .  Multiple Vitamin (MULTIVITAMIN) capsule, Take 1 capsule by mouth daily., Disp: , Rfl:  .  Omega-3 Fatty Acids (FISH OIL) 1000 MG CAPS, Take 1,000 mg by mouth daily., Disp: , Rfl:  .  pantoprazole (PROTONIX) 40 MG tablet, Take 1 tablet (40 mg total) by mouth daily., Disp: 30 tablet, Rfl: 11 .  pravastatin (PRAVACHOL) 20 MG tablet, TAKE 1 TABLET BY MOUTH EVERY DAY IN THE EVENING, Disp: 90 tablet, Rfl: 3 .  telmisartan-hydrochlorothiazide (MICARDIS HCT) 40-12.5 MG tablet, TAKE 1 TABLET DAILY, Disp: 90 tablet, Rfl: 1  No Known Allergies Review of Systems Objective:  There were no vitals filed for this visit.  General: Well developed, nourished, in no acute distress, alert and oriented x3   Dermatological: Skin is warm, dry and supple bilateral. Nails x 10 are well maintained; remaining integument appears unremarkable at this time. There are no open sores, no preulcerative lesions, no rash or signs of infection present.  Vascular: Dorsalis Pedis artery and Posterior Tibial artery pedal pulses are 2/4 bilateral with immedate capillary fill time. Pedal hair growth present. No varicosities and no lower extremity edema present bilateral.  It appears he has some venous insufficiency particularly in the legs and feet.  Is considerable amount of swelling some pitting edema present.  Neruologic: Grossly intact via light touch bilateral. Vibratory intact via tuning fork bilateral. Protective threshold with  Semmes Wienstein monofilament intact to all pedal sites bilateral. Patellar and Achilles deep tendon reflexes 2+ bilateral. No Babinski or clonus noted bilateral.  He has pain on palpation of the deep peroneal nerve right at the level of the tarsometatarsal joint overlying the base of the first intermetatarsal space.  Musculoskeletal: No gross boney pedal deformities bilateral. No pain, crepitus, or limitation noted with foot and ankle range of motion bilateral.  Muscular strength 5/5 in all groups tested bilateral.  Gait: Unassisted, Nonantalgic.    Radiographs:  Radiographs taken do not demonstrate any type of significant osseous abnormalities that would result in the symptoms that the patient is describing.  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  Plan: Discussed etiology pathology conservative versus surgical therapies at this point I think is important to have venous insufficiency Doppler studies performed.  He is also going to see neurology appointment is already made for 2 weeks.  He asked if I think he should keep this appointment I expressed to him most definitely.  I did inject today 10 mg of Kenalog overlying the tarsometatarsal joints.  He tolerated procedure well without complications.  Follow-up with him once he is completed with his venous studies.     Shadai Mcclane T. [place], [place]

## 2019-08-27 ENCOUNTER — Telehealth: Payer: Self-pay | Admitting: *Deleted

## 2019-08-27 DIAGNOSIS — I872 Venous insufficiency (chronic) (peripheral): Secondary | ICD-10-CM

## 2019-08-27 DIAGNOSIS — M7989 Other specified soft tissue disorders: Secondary | ICD-10-CM

## 2019-08-27 DIAGNOSIS — G5791 Unspecified mononeuropathy of right lower limb: Secondary | ICD-10-CM

## 2019-08-27 NOTE — Telephone Encounter (Signed)
-----   Message from Rip Harbour, [facility] sent at 08/25/2019 11:28 AM EDT ----- Regarding: Vascular Referral to Vascular-  [facility]  Venous insufficency - dopplers - evaluate edema

## 2019-08-27 NOTE — Telephone Encounter (Signed)
[facility] states they only do certain lab studies there otherwise would go to [facility] or [facility].

## 2019-08-27 NOTE — Telephone Encounter (Signed)
Faxed orders to [facility]. 

## 2019-09-09 ENCOUNTER — Ambulatory Visit (INDEPENDENT_AMBULATORY_CARE_PROVIDER_SITE_OTHER): Payer: Medicare Other | Admitting: Neurology

## 2019-09-09 ENCOUNTER — Telehealth: Payer: Self-pay | Admitting: Cardiology

## 2019-09-09 ENCOUNTER — Encounter: Payer: Self-pay | Admitting: Neurology

## 2019-09-09 VITALS — BP 169/92 | HR 103 | Ht 72.0 in | Wt 285.5 lb

## 2019-09-09 DIAGNOSIS — R251 Tremor, unspecified: Secondary | ICD-10-CM | POA: Diagnosis not present

## 2019-09-09 DIAGNOSIS — R200 Anesthesia of skin: Secondary | ICD-10-CM | POA: Diagnosis not present

## 2019-09-09 DIAGNOSIS — I2510 Atherosclerotic heart disease of native coronary artery without angina pectoris: Secondary | ICD-10-CM | POA: Diagnosis not present

## 2019-09-09 DIAGNOSIS — G629 Polyneuropathy, unspecified: Secondary | ICD-10-CM

## 2019-09-09 MED ORDER — PRIMIDONE 50 MG PO TABS: ORAL_TABLET | ORAL | 5 refills | Status: DC

## 2019-09-09 NOTE — Telephone Encounter (Signed)
Todd Chan is calling requesting a nurse call him to explain his vascular test scheduled for 09/17/19. Please advise.

## 2019-09-09 NOTE — Telephone Encounter (Signed)
Left message on patients voicemail to please return our call.   

## 2019-09-09 NOTE — Progress Notes (Signed)
[facility]  PATIENT: Todd Chan DOB: Jul 07, 1945  REFERRING DOCTOR OR PCP:  Todd Sickles, DO SOURCE: patient, notes from Dr. Jaynee Chan and PCP  _________________________________   HISTORICAL  CHIEF COMPLAINT:  Chief Complaint  Patient presents with  . New Patient (Initial Visit)    RM 15, with wife. Internal referral from Todd Priest, MD for tremors in left arm/left leg, neuropathy.    HISTORY OF PRESENT ILLNESS:  I had the pleasure of seeing your patient, Todd Chan, at [facility] for a consultation regarding his tremors and neuropathy.    He is a 74 year old man who has had a progressively worsening tremor over the past 5-6 years on his left side only.  Initially, only the hand was involved and he noted worsening handwriting.   He noted the tremor in his leg a year or two later.    Tremor is worse if with intention and is a little worse if more tense.   He has noted some improvement with relaxing the left shoulder/back. He has been on metoprolol for a few years and did not note any improvement when he started.   Scotch helps some.      He has had numbness in his feet for several years and saw Dr. Jaynee Chan once in 2019.  He has right > left leg edema.    He feels the numbness and foot/ankle/lower leg swelling have both worsened.   In 2019, SPEP/IEF, SSA/SSB, RF, thiamine, ESR were fine.  He has seen Dr. Bettina Chan, cardiology, for hyperlipidemia and enlarged aorta.  This has been stable.     He is on metoprolol 25 mg po bid   REVIEW OF SYSTEMS: Constitutional: No fevers, chills, sweats, or change in appetite Eyes: No visual changes, double vision, eye pain Ear, nose and throat: No hearing loss, ear pain, nasal congestion, sore throat Cardiovascular: No chest pain, palpitations Respiratory: No shortness of breath at rest or with exertion.   No wheezes GastrointestinaI: No nausea, vomiting, diarrhea, abdominal pain, fecal  incontinence Genitourinary: No dysuria, urinary retention or frequency.  No nocturia. Musculoskeletal: No neck pain, back pain Integumentary: No rash, pruritus, skin lesions Neurological: as above Psychiatric: No depression at this time.  No anxiety Endocrine: No palpitations, diaphoresis, change in appetite, change in weigh or increased thirst Hematologic/Lymphatic: No anemia, purpura, petechiae. Allergic/Immunologic: No itchy/runny eyes, nasal congestion, recent allergic reactions, rashes  ALLERGIES: No Known Allergies  HOME MEDICATIONS:  Current Outpatient Medications:  .  aspirin EC 81 MG tablet, Take 81 mg by mouth daily., Disp: , Rfl:  .  Cholecalciferol (VITAMIN D3) 50 MCG (2000 UT) capsule, Take 2,000 Units by mouth daily., Disp: , Rfl:  .  ezetimibe (ZETIA) 10 MG tablet, Take 5 mg by mouth daily., Disp: , Rfl:  .  metoprolol tartrate (LOPRESSOR) 25 MG tablet, TAKE 1 TABLET TWICE A DAY, Disp: 180 tablet, Rfl: 1 .  Multiple Vitamin (MULTIVITAMIN) capsule, Take 1 capsule by mouth daily., Disp: , Rfl:  .  Omega-3 Fatty Acids (FISH OIL) 1000 MG CAPS, Take 1,000 mg by mouth daily., Disp: , Rfl:  .  pantoprazole (PROTONIX) 40 MG tablet, Take 1 tablet (40 mg total) by mouth daily., Disp: 30 tablet, Rfl: 11 .  pravastatin (PRAVACHOL) 20 MG tablet, TAKE 1 TABLET BY MOUTH EVERY DAY IN THE EVENING, Disp: 90 tablet, Rfl: 3 .  telmisartan-hydrochlorothiazide (MICARDIS HCT) 40-12.5 MG tablet, TAKE 1 TABLET DAILY, Disp: 90 tablet, Rfl: 1  PAST MEDICAL  HISTORY: Past Medical History:  Diagnosis Date  . Arthritis   . GERD (gastroesophageal reflux disease)   . Hypertension   . Osteoarthritis of knee   . Painful urination   . Peripheral vascular disease ([place])    bilateral edema     PAST SURGICAL HISTORY: Past Surgical History:  Procedure Laterality Date  . CARDIAC CATHETERIZATION  04/2017  . ESOPHAGOGASTRODUODENOSCOPY ENDOSCOPY     8 days ago  . KNEE ARTHROSCOPY Left 6-7 years ago   . LEFT HEART CATH AND CORONARY ANGIOGRAPHY N/A 04/17/2017   Procedure: LEFT HEART CATH AND CORONARY ANGIOGRAPHY;  Surgeon: Todd Sine, MD;  Location: [facility];  Service: Cardiovascular;  Laterality: N/A;  . 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 TONSILLECTOMY    . TOTAL KNEE ARTHROPLASTY Left 10/08/2016   Procedure: LEFT TOTAL KNEE ARTHROPLASTY;  Surgeon: Todd Arabian, MD;  Location: [facility];  Service: Orthopedics;  Laterality: Left;  . TOTAL KNEE ARTHROPLASTY Right 06/17/2017   Procedure: RIGHT TOTAL KNEE ARTHROPLASTY;  Surgeon: Todd Arabian, MD;  Location: [facility];  Service: Orthopedics;  Laterality: Right;    FAMILY HISTORY: Family History  Problem Relation Age of Onset  . Breast cancer Mother 26  . Prostate cancer Father   . Breast cancer Sister 50  . Neuropathy Neg Hx     SOCIAL HISTORY:  Social History   Socioeconomic History  . Marital status: Married    Spouse name: Not on file  . Number of children: 2  . Years of education: Not on file  . Highest education level: Bachelor's degree (e.g., BA, AB, BS)  Occupational History  . Not on file  Tobacco Use  . Smoking status: Former Smoker    Packs/day: 1.00    Years: 20.00    Pack years: 20.00    Quit date: 10/1987    Years since quitting: 31.9  . Smokeless tobacco: Never Used  . Tobacco comment: quit 30 yeara ago   Substance and Sexual Activity  . Alcohol use: Yes    Comment: 3 drinks per day- wine and scotch  . Drug use: No  . Sexual activity: Yes  Other Topics Concern  . Not on file  Social History Narrative   Lives at home with his wife   Left handed   Drinks 2-3 cups of caffeine daily (coffee)   Social Determinants of Health   Financial Resource Strain:   . Difficulty of Paying Living Expenses:   Food Insecurity:   . Worried About Charity fundraiser in the Last Year:   . Arboriculturist in the Last Year:   Transportation Needs:   . Film/video editor (Medical):   Marland Kitchen Lack of  Transportation (Non-Medical):   Physical Activity:   . Days of Exercise per Week:   . Minutes of Exercise per Session:   Stress:   . Feeling of Stress :   Social Connections:   . Frequency of Communication with Friends and Family:   . Frequency of Social Gatherings with Friends and Family:   . Attends Religious Services:   . Active Member of Clubs or Organizations:   . Attends Archivist Meetings:   Marland Kitchen Marital Status:   Intimate Partner Violence:   . Fear of Current or Ex-Partner:   . Emotionally Abused:   Marland Kitchen Physically Abused:   . Sexually Abused:      PHYSICAL EXAM  Vitals:   09/09/19 1441  BP: (!) 169/92  Pulse: Marland Kitchen)  103  Weight: 285 lb 8 oz (129.5 kg)  Height: 6' (1.829 m)    Body mass index is 38.72 kg/m.   General: The patient is well-developed and well-nourished and in no acute distress  HEENT:  Head is [place]/AT.  Sclera are anicteric.  Funduscopic exam shows normal optic discs and retinal vessels.  Neck: No carotid bruits are noted.  The neck is nontender.  Cardiovascular: The heart has a regular rate and rhythm with a normal S1 and S2. There were no murmurs, gallops or rubs.    Skin: Extremities are without rash or  edema.  Musculoskeletal:  Back is nontender  Neurologic Exam  Mental status: The patient is alert and oriented x 3 at the time of the examination. The patient has apparent normal recent and remote memory, with an apparently normal attention span and concentration ability.   Speech is normal.  Cranial nerves: Extraocular movements are full. Pupils are equal, round, and reactive to light and accomodation.  Visual fields are full.  Facial symmetry is present. There is good facial sensation to soft touch bilaterally.Facial strength is normal.  Trapezius and sternocleidomastoid strength is normal. No dysarthria is noted. No obvious hearing deficits are noted.  Motor: He has a rapid tremor in the left hand that is worse with intention but  present at rest.  There is no tremor in the right arm.  There is a mild tremor in the left.  Muscle bulk is normal.   Tone is normal. Strength is  5 / 5 in all 4 extremities except 4+/5 EHL strength in the feet.   Sensory: Sensory testing is intact to pinprick, soft touch and vibration sensation in the arms but reduced pinprick and vibration in the foot.  Vibration sense minimally reduced at the ankle.  Coordination: Cerebellar testing reveals good finger-nose-finger and heel-to-shin bilaterally.  Gait and station: Station is normal.  Gait and tandem gait are normal for age.  He had no retropulsion.  Romberg is negative.   Reflexes: Deep tendon reflexes are symmetric and normal bilaterally.   Plantar responses are flexor.    DIAGNOSTIC DATA (LABS, IMAGING, TESTING) - I reviewed patient records, labs, notes, testing and imaging myself where available.  Lab Results  Component Value Date   WBC 7.8 04/05/2018   HGB 15.5 04/05/2018   HCT 47.3 04/05/2018   MCV 100.9 (H) 04/05/2018   PLT 153 04/05/2018      Component Value Date/Time   NA 142 05/28/2019 1113   K 4.6 05/28/2019 1113   CL 99 05/28/2019 1113   CO2 25 05/28/2019 1113   GLUCOSE 120 (H) 05/28/2019 1113   GLUCOSE 108 (H) 04/05/2018 1724   BUN 13 05/28/2019 1113   CREATININE 0.96 05/28/2019 1113   CALCIUM 10.0 05/28/2019 1113   PROT 7.0 04/09/2019 1210   ALBUMIN 4.7 04/09/2019 1210   AST 34 04/09/2019 1210   ALT 28 04/09/2019 1210   ALKPHOS 40 04/09/2019 1210   BILITOT 1.7 (H) 04/09/2019 1210   GFRNONAA 79 05/28/2019 1113   GFRAA 92 05/28/2019 1113   Lab Results  Component Value Date   CHOL 113 05/11/2019   HDL 50 05/11/2019   LDLCALC 50 05/11/2019   TRIG 60 05/11/2019   CHOLHDL 2.3 05/11/2019        ASSESSMENT AND PLAN  Tremor - Plan: Vitamin B12, Multiple Myeloma Panel (SPEP&IFE w/QIG)  Polyneuropathy - Plan: Vitamin B12, Multiple Myeloma Panel (SPEP&IFE w/QIG), Sjogren's syndrome antibods(ssa +  ssb)  Numbness -  Plan: Vitamin B12, Multiple Myeloma Panel (SPEP&IFE w/QIG), Sjogren's syndrome antibods(ssa + ssb)   In summary, Mr. Pelzer is a 74 year old man with a tremor on the left side, pedal edema and polyneuropathy.  The characteristics of the tremor are unusual.  It is rapid, worse with intention and better with alcohol but is very asymmetric.  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  I will start him on primidone and titrate the dose as tolerated and needed.  He is already on a beta-blocker.  If not successful, we will consider a benzodiazepine.  Also, since the tremor so unilateral Parkinson's disease is still in the differential diagnosis and I would consider a short trial of Sinemet if standard drugs for BET are unsuccessful.  We will check some blood work for the polyneuropathy.  I will hold off on EMG/NCV at this point but will consider in the future if there is worsening.  He will return to see me in 3 months or sooner if there are new or worsening neurologic symptoms.  Thank you for asking me to see Mr. Freiberger.  Please let me know if I can be of further assistance with him or other patients in the future.   Kyson Kupper A. Felecia Shelling, MD, [facility]ord Shave 07/08/9978, 0:12 PM Certified in Neurology, Clinical Neurophysiology, Sleep Medicine and Neuroimaging  [facility] [address] [place], [place] [address] [phone]

## 2019-09-10 NOTE — Telephone Encounter (Signed)
Spoke to the patient just now and he wanted to know if the vascular test he is having done soon required him to get into a tube or machine. I let him know that he would not have to do that for this Ultrasound. He verbalizes understanding and thanks me for the call.    Encouraged patient to call back with any questions or concerns.

## 2019-09-14 LAB — MULTIPLE MYELOMA PANEL, SERUM
Albumin SerPl Elph-Mcnc: 3.8 g/dL (ref 2.9–4.4)
Albumin/Glob SerPl: 1.1 (ref 0.7–1.7)
Alpha 1: 0.3 g/dL (ref 0.0–0.4)
Alpha2 Glob SerPl Elph-Mcnc: 0.8 g/dL (ref 0.4–1.0)
B-Globulin SerPl Elph-Mcnc: 1.2 g/dL (ref 0.7–1.3)
Gamma Glob SerPl Elph-Mcnc: 1.2 g/dL (ref 0.4–1.8)
Globulin, Total: 3.6 g/dL (ref 2.2–3.9)
IgA/Immunoglobulin A, Serum: 312 mg/dL (ref 61–437)
IgG (Immunoglobin G), Serum: 1335 mg/dL (ref 603–1613)
IgM (Immunoglobulin M), Srm: 82 mg/dL (ref 15–143)
Total Protein: 7.4 g/dL (ref 6.0–8.5)

## 2019-09-14 LAB — SJOGREN'S SYNDROME ANTIBODS(SSA + SSB)
ENA SSA (RO) Ab: <0.2 AI (ref 0.0–0.9)
ENA SSB (LA) Ab: <0.2 AI (ref 0.0–0.9)

## 2019-09-14 LAB — VITAMIN B12: Vitamin B-12: 501 pg/mL (ref 232–1245)

## 2019-09-17 ENCOUNTER — Other Ambulatory Visit: Payer: Self-pay

## 2019-09-17 ENCOUNTER — Ambulatory Visit (HOSPITAL_COMMUNITY)
Admission: RE | Admit: 2019-09-17 | Discharge: 2019-09-17 | Disposition: A | Discharge status: Home / Self Care | Payer: Medicare Other | Source: Ambulatory Visit | Attending: Cardiology | Admitting: Cardiology

## 2019-09-17 DIAGNOSIS — I872 Venous insufficiency (chronic) (peripheral): Secondary | ICD-10-CM | POA: Insufficient documentation

## 2019-09-17 DIAGNOSIS — G5791 Unspecified mononeuropathy of right lower limb: Secondary | ICD-10-CM | POA: Insufficient documentation

## 2019-09-17 DIAGNOSIS — M7989 Other specified soft tissue disorders: Secondary | ICD-10-CM | POA: Diagnosis not present

## 2019-09-21 ENCOUNTER — Telehealth: Payer: Self-pay | Admitting: *Deleted

## 2019-09-21 DIAGNOSIS — M7989 Other specified soft tissue disorders: Secondary | ICD-10-CM

## 2019-09-21 DIAGNOSIS — G5791 Unspecified mononeuropathy of right lower limb: Secondary | ICD-10-CM

## 2019-09-21 DIAGNOSIS — I872 Venous insufficiency (chronic) (peripheral): Secondary | ICD-10-CM

## 2019-09-21 NOTE — Telephone Encounter (Signed)
Pt called for results and I informed of Dr. Stephenie Acres review of results and [facility]'s recommendation. Pt states understanding. Faxed referral, clinicals and demographics to [facility].

## 2019-09-21 NOTE — Telephone Encounter (Signed)
Left message for pt to call to discuss results.

## 2019-09-21 NOTE — Telephone Encounter (Signed)
-----   Message from Garrel Ridgel, [place] sent at 09/19/2019  8:14 AM EDT ----- They are recommending consult with pain clinic.

## 2019-09-22 ENCOUNTER — Encounter: Payer: Self-pay | Admitting: Cardiovascular Disease

## 2019-09-22 ENCOUNTER — Ambulatory Visit (INDEPENDENT_AMBULATORY_CARE_PROVIDER_SITE_OTHER): Payer: Medicare Other | Admitting: Cardiovascular Disease

## 2019-09-22 ENCOUNTER — Other Ambulatory Visit: Payer: Self-pay

## 2019-09-22 ENCOUNTER — Telehealth: Payer: Self-pay | Admitting: *Deleted

## 2019-09-22 VITALS — BP 124/72 | HR 72 | Ht 72.0 in | Wt 288.0 lb

## 2019-09-22 DIAGNOSIS — I10 Essential (primary) hypertension: Secondary | ICD-10-CM | POA: Diagnosis not present

## 2019-09-22 DIAGNOSIS — I872 Venous insufficiency (chronic) (peripheral): Secondary | ICD-10-CM

## 2019-09-22 DIAGNOSIS — M7989 Other specified soft tissue disorders: Secondary | ICD-10-CM

## 2019-09-22 DIAGNOSIS — E7849 Other hyperlipidemia: Secondary | ICD-10-CM

## 2019-09-22 DIAGNOSIS — I2510 Atherosclerotic heart disease of native coronary artery without angina pectoris: Secondary | ICD-10-CM

## 2019-09-22 NOTE — Telephone Encounter (Signed)
Pt states the doctor he saw today was cardio and he thought he needed vein. I reviewed the orders and Dr. Milinda Pointer had said vein and vascular and I referred to the doctor pt was tested. I apologized and referred to Vein and Vascular. Faxed orders to [facility].

## 2019-09-22 NOTE — Telephone Encounter (Signed)
Pt states he would like to speak with nurse again.

## 2019-09-22 NOTE — Patient Instructions (Addendum)
Medication Instructions:  No changes *If you need a refill on your cardiac medications before your next appointment, please call your pharmacy*   Lab Work: None ordered If you have labs (blood work) drawn today and your tests are completely normal, you will receive your results only by: Marland Kitchen MyChart Message (if you have MyChart) OR . A paper copy in the mail If you have any lab test that is abnormal or we need to change your treatment, we will call you to review the results.   Testing/Procedures: None ordered   Follow-Up: At [facility], you and your health needs are our priority.  As part of our continuing mission to provide you with exceptional heart care, we have created designated Provider Care Teams.  These Care Teams include your primary Cardiologist (physician) and Advanced Practice Providers (APPs -  Physician Assistants and Nurse Practitioners) who all work together to provide you with the care you need, when you need it.  We recommend signing up for the patient portal called "MyChart".  Sign up information is provided on this After Visit Summary.  MyChart is used to connect with patients for Virtual Visits (Telemedicine).  Patients are able to view lab/test results, encounter notes, upcoming appointments, etc.  Non-urgent messages can be sent to your provider as well.   To learn more about what you can do with MyChart, go to [email or web address].    Your next appointment:   6 month(s)  The format for your next appointment:   In Person  Provider:   Kathlyn Sacramento, MD   Other Instructions Please wear the knee high stockings during the day and take them off at night.  Elevate your legs three times a day for 20 minutes at a time.

## 2019-09-22 NOTE — Progress Notes (Signed)
Cardiology Office Note   Date:  09/23/2019   ID:  Todd Chan, Todd Chan Jan 05, 1946, MRN [id number]  PCP:  Todd Bishop, DO  Cardiologist:   Dr. Bettina Chan  No chief complaint on file.     History of Present Illness: Todd Chan is a 74 y.o. male who was referred by Dr. Milinda Chan for evaluation management of chronic venous insufficiency. The patient has history of small ascending aortic aneurysm, hyperlipidemia, essential tremor, essential hypertension and dizziness.  He had bilateral knee replacement.  He has history of remote smoking and he is not diabetic. Over the last 3 months, he developed bilateral ankle and foot swelling especially on the right side with associated heaviness and numbness.  He has no leg claudication.  No chest pain, shortness of breath or palpitations.  No lower extremity ulceration.  Recent lower extremity venous duplex showed no evidence of DVT.  There was evidence of venous reflux in the bilateral greater saphenous vein in the thigh and calf.    Past Medical History:  Diagnosis Date  . Arthritis   . GERD (gastroesophageal reflux disease)   . Hypertension   . Osteoarthritis of knee   . Painful urination   . Peripheral vascular disease (Todd Chan)    bilateral edema     Past Surgical History:  Procedure Laterality Date  . CARDIAC CATHETERIZATION  04/2017  . ESOPHAGOGASTRODUODENOSCOPY ENDOSCOPY     8 days ago  . KNEE ARTHROSCOPY Left 6-7 years ago  . LEFT HEART CATH AND CORONARY ANGIOGRAPHY N/A 04/17/2017   Procedure: LEFT HEART CATH AND CORONARY ANGIOGRAPHY;  Surgeon: Todd Sine, MD;  Location: [facility];  Service: Cardiovascular;  Laterality: N/A;  . 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 TONSILLECTOMY    . TOTAL KNEE ARTHROPLASTY Left 10/08/2016   Procedure: LEFT TOTAL KNEE ARTHROPLASTY;  Surgeon: Todd Arabian, MD;  Location: [facility];  Service: Orthopedics;  Laterality: Left;  . TOTAL KNEE ARTHROPLASTY Right 06/17/2017    Procedure: RIGHT TOTAL KNEE ARTHROPLASTY;  Surgeon: Todd Arabian, MD;  Location: [facility];  Service: Orthopedics;  Laterality: Right;     Current Outpatient Medications  Medication Sig Dispense Refill  . aspirin EC 81 MG tablet Take 81 mg by mouth daily.    . Cholecalciferol (VITAMIN D3) 50 MCG (2000 UT) capsule Take 2,000 Units by mouth daily.    Marland Kitchen ezetimibe (ZETIA) 10 MG tablet Take 5 mg by mouth daily.    . metoprolol tartrate (LOPRESSOR) 25 MG tablet TAKE 1 TABLET TWICE A DAY 180 tablet 1  . Multiple Vitamin (MULTIVITAMIN) capsule Take 1 capsule by mouth daily.    . Omega-3 Fatty Acids (FISH OIL) 1000 MG CAPS Take 1,000 mg by mouth daily.    . pantoprazole (PROTONIX) 40 MG tablet Take 1 tablet (40 mg total) by mouth daily. 30 tablet 11  . pravastatin (PRAVACHOL) 20 MG tablet TAKE 1 TABLET BY MOUTH EVERY DAY IN THE EVENING 90 tablet 3  . primidone (MYSOLINE) 50 MG tablet Take one pill up to three times a day 90 tablet 5  . telmisartan-hydrochlorothiazide (MICARDIS HCT) 40-12.5 MG tablet TAKE 1 TABLET DAILY 90 tablet 1   No current facility-administered medications for this visit.    Allergies:   Patient has no known allergies.    Social History:  The patient  reports that he quit smoking about 32 years ago. He has a 20.00 pack-year smoking history. He has never used smokeless tobacco. He reports current  alcohol use. He reports that he does not use drugs.   Family History:  The patient's family history includes Breast cancer (age of onset: 22) in his sister; Breast cancer (age of onset: 99) in his mother; Prostate cancer in his father.    ROS:  Please see the history of present illness.   Otherwise, review of systems are positive for none.   All other systems are reviewed and negative.    PHYSICAL EXAM: VS:  BP 124/72   Pulse 72   Ht 6' (1.829 m)   Wt 288 lb (130.6 kg)   SpO2 97%   BMI 39.06 kg/m  , BMI Body mass index is 39.06 kg/m. GEN: Well nourished, well developed, in  no acute distress  HEENT: normal  Neck: no JVD, carotid bruits, or masses Cardiac: RRR; no murmurs, rubs, or gallops,no edema  Respiratory:  clear to auscultation bilaterally, normal work of breathing GI: soft, nontender, nondistended, + BS MS: no deformity or atrophy  Skin: warm and dry, no rash Neuro:  Strength and sensation are intact Psych: euthymic mood, full affect Vascular: He has strong palpable pulses in the feet.  There is +2 edema bilaterally and varicose veins.   EKG:  EKG is ordered today. The ekg ordered today demonstrates normal sinus rhythm with no significant ST or T wave changes.   Recent Labs: 04/09/2019: ALT 28 05/28/2019: BUN 13; Creatinine, Ser 0.96; Potassium 4.6; Sodium 142    Lipid Panel    Component Value Date/Time   CHOL 113 05/11/2019 1156   TRIG 60 05/11/2019 1156   HDL 50 05/11/2019 1156   CHOLHDL 2.3 05/11/2019 1156   LDLCALC 50 05/11/2019 1156      Wt Readings from Last 3 Encounters:  09/22/19 288 lb (130.6 kg)  09/09/19 285 lb 8 oz (129.5 kg)  07/08/19 280 lb (127 kg)        PAD Screen 03/19/2017  Previous PAD dx? No  Previous surgical procedure? No  Pain with walking? No  Feet/toe relief with dangling? No  Painful, non-healing ulcers? No  Extremities discolored? No      ASSESSMENT AND PLAN:  1.  Chronic venous insufficiency: The patient has significant symptoms overall including swelling, discomfort and numbness.  There is no evidence of peripheral arterial disease.  I discussed with him management options and have recommended starting an exercise program, leg elevation at least 3 times a day for 20 minutes each time.  In addition, I prescribed knee-high support stockings at 20 to 30 mmHg to be used during the day. If the patient fails above measures, he might be a candidate for laser ablation.  2.  Essential hypertension: Blood pressure is reasonably controlled.  3.  Hyperlipidemia: Currently on  pravastatin.    Disposition:   FU with me in 6 months  Signed,  Todd Sacramento, MD  09/23/2019 10:41 AM    [place]

## 2019-09-23 ENCOUNTER — Telehealth: Payer: Self-pay | Admitting: Podiatry

## 2019-09-23 DIAGNOSIS — M7989 Other specified soft tissue disorders: Secondary | ICD-10-CM

## 2019-09-23 DIAGNOSIS — I872 Venous insufficiency (chronic) (peripheral): Secondary | ICD-10-CM

## 2019-09-23 NOTE — Telephone Encounter (Signed)
Pt requested to go to [facility] on [address] for his testing please assist

## 2019-09-23 NOTE — Telephone Encounter (Signed)
Patient called lvm 09/23/19 @ 8:08 am  He wanted a call back to discuss a different vein clinic he did not wannna go to the one that we are suggestion please advise

## 2019-09-24 ENCOUNTER — Telehealth: Payer: Self-pay | Admitting: *Deleted

## 2019-09-24 NOTE — Telephone Encounter (Signed)
I informed pt the referral had been sent to [facility].

## 2019-09-24 NOTE — Telephone Encounter (Signed)
Entered in error

## 2019-09-24 NOTE — Telephone Encounter (Signed)
Pt requested to go to [facility] on [address] Faxed referral, clinicals and demographics to [facility]

## 2019-09-24 NOTE — Addendum Note (Signed)
Addended by: Harriett Sine D on: 09/24/2019 11:16 AM   Modules accepted: Orders

## 2019-10-07 DIAGNOSIS — I8311 Varicose veins of right lower extremity with inflammation: Secondary | ICD-10-CM | POA: Diagnosis not present

## 2019-10-07 DIAGNOSIS — I8312 Varicose veins of left lower extremity with inflammation: Secondary | ICD-10-CM | POA: Diagnosis not present

## 2019-10-07 DIAGNOSIS — R600 Localized edema: Secondary | ICD-10-CM | POA: Diagnosis not present

## 2019-10-13 DIAGNOSIS — G250 Essential tremor: Secondary | ICD-10-CM | POA: Diagnosis not present

## 2019-10-13 DIAGNOSIS — E785 Hyperlipidemia, unspecified: Secondary | ICD-10-CM | POA: Diagnosis not present

## 2019-10-13 DIAGNOSIS — I10 Essential (primary) hypertension: Secondary | ICD-10-CM | POA: Diagnosis not present

## 2019-10-13 DIAGNOSIS — M62838 Other muscle spasm: Secondary | ICD-10-CM | POA: Diagnosis not present

## 2019-10-13 DIAGNOSIS — I712 Thoracic aortic aneurysm, without rupture: Secondary | ICD-10-CM | POA: Diagnosis not present

## 2019-10-13 DIAGNOSIS — L821 Other seborrheic keratosis: Secondary | ICD-10-CM | POA: Diagnosis not present

## 2019-10-13 DIAGNOSIS — Z125 Encounter for screening for malignant neoplasm of prostate: Secondary | ICD-10-CM | POA: Diagnosis not present

## 2019-10-13 DIAGNOSIS — N401 Enlarged prostate with lower urinary tract symptoms: Secondary | ICD-10-CM | POA: Diagnosis not present

## 2019-10-13 DIAGNOSIS — M720 Palmar fascial fibromatosis [Dupuytren]: Secondary | ICD-10-CM | POA: Diagnosis not present

## 2019-10-21 DIAGNOSIS — M5134 Other intervertebral disc degeneration, thoracic region: Secondary | ICD-10-CM | POA: Diagnosis not present

## 2019-10-21 DIAGNOSIS — M5033 Other cervical disc degeneration, cervicothoracic region: Secondary | ICD-10-CM | POA: Diagnosis not present

## 2019-10-21 DIAGNOSIS — M9901 Segmental and somatic dysfunction of cervical region: Secondary | ICD-10-CM | POA: Diagnosis not present

## 2019-10-21 DIAGNOSIS — M9902 Segmental and somatic dysfunction of thoracic region: Secondary | ICD-10-CM | POA: Diagnosis not present

## 2019-10-23 DIAGNOSIS — H8110 Benign paroxysmal vertigo, unspecified ear: Secondary | ICD-10-CM | POA: Diagnosis not present

## 2019-10-23 DIAGNOSIS — J019 Acute sinusitis, unspecified: Secondary | ICD-10-CM | POA: Diagnosis not present

## 2019-10-26 DIAGNOSIS — R600 Localized edema: Secondary | ICD-10-CM | POA: Diagnosis not present

## 2019-10-26 DIAGNOSIS — I8311 Varicose veins of right lower extremity with inflammation: Secondary | ICD-10-CM | POA: Diagnosis not present

## 2019-10-26 DIAGNOSIS — I8312 Varicose veins of left lower extremity with inflammation: Secondary | ICD-10-CM | POA: Diagnosis not present

## 2019-11-02 DIAGNOSIS — M9901 Segmental and somatic dysfunction of cervical region: Secondary | ICD-10-CM | POA: Diagnosis not present

## 2019-11-02 DIAGNOSIS — M5134 Other intervertebral disc degeneration, thoracic region: Secondary | ICD-10-CM | POA: Diagnosis not present

## 2019-11-02 DIAGNOSIS — M9902 Segmental and somatic dysfunction of thoracic region: Secondary | ICD-10-CM | POA: Diagnosis not present

## 2019-11-02 DIAGNOSIS — M5033 Other cervical disc degeneration, cervicothoracic region: Secondary | ICD-10-CM | POA: Diagnosis not present

## 2019-11-18 DIAGNOSIS — R600 Localized edema: Secondary | ICD-10-CM | POA: Diagnosis not present

## 2019-11-18 DIAGNOSIS — I8311 Varicose veins of right lower extremity with inflammation: Secondary | ICD-10-CM | POA: Diagnosis not present

## 2019-11-18 DIAGNOSIS — I8312 Varicose veins of left lower extremity with inflammation: Secondary | ICD-10-CM | POA: Diagnosis not present

## 2019-11-27 DIAGNOSIS — M9901 Segmental and somatic dysfunction of cervical region: Secondary | ICD-10-CM | POA: Diagnosis not present

## 2019-11-27 DIAGNOSIS — M5134 Other intervertebral disc degeneration, thoracic region: Secondary | ICD-10-CM | POA: Diagnosis not present

## 2019-11-27 DIAGNOSIS — M5033 Other cervical disc degeneration, cervicothoracic region: Secondary | ICD-10-CM | POA: Diagnosis not present

## 2019-11-27 DIAGNOSIS — M9902 Segmental and somatic dysfunction of thoracic region: Secondary | ICD-10-CM | POA: Diagnosis not present

## 2019-11-30 ENCOUNTER — Encounter (INDEPENDENT_AMBULATORY_CARE_PROVIDER_SITE_OTHER): Payer: Self-pay | Admitting: Otolaryngology

## 2019-11-30 ENCOUNTER — Ambulatory Visit (INDEPENDENT_AMBULATORY_CARE_PROVIDER_SITE_OTHER): Payer: Medicare Other | Admitting: Otolaryngology

## 2019-11-30 ENCOUNTER — Other Ambulatory Visit: Payer: Self-pay

## 2019-11-30 VITALS — Temp 96.4°F

## 2019-11-30 DIAGNOSIS — I2510 Atherosclerotic heart disease of native coronary artery without angina pectoris: Secondary | ICD-10-CM

## 2019-11-30 DIAGNOSIS — R42 Dizziness and giddiness: Secondary | ICD-10-CM

## 2019-11-30 DIAGNOSIS — J310 Chronic rhinitis: Secondary | ICD-10-CM | POA: Diagnosis not present

## 2019-11-30 NOTE — Progress Notes (Signed)
HPI: Todd Chan is a 74 y.o. male who presents is referred by [facility] physicians for evaluation of "vertigo" as well as pressure in his ears that comes and goes.  He describes more pressure in his ears when he first sits up or stands up.  He also has pressure behind his eyes and pain in the back of his neck.  He has seen a chiropractor about the pain in the back of the neck.  He has a little off balance and dizziness but no real vertigo or spinning sensation.  He does have some nausea when he wakes up in the morning.  He has not noticed any hearing problems or change in his hearing.  But he does describe pressure in the ears.  He was treated with 1 course of antibiotics for possible sinus infection.  However he denies any pain or pressure in the face or paranasal area and no yellow-green discharge from his nose.  Whenever he blows his nose he has mostly just clear mucus.. He has a chronic tremor and has seen neurology for his chronic tremor.  Some of his dizzy symptoms initially began following taking a new medication for the tremor but he is subsequently stopped this.  This started about 2 months ago when he first started the medication..  He has another appointment with the audiologist in about a month. He also describes feeling a little "lightheaded" when he is driving on a winding road.  Past Medical History:  Diagnosis Date  . Arthritis   . GERD (gastroesophageal reflux disease)   . Hypertension   . Osteoarthritis of knee   . Painful urination   . Peripheral vascular disease ([place])    bilateral edema    Past Surgical History:  Procedure Laterality Date  . CARDIAC CATHETERIZATION  04/2017  . ESOPHAGOGASTRODUODENOSCOPY ENDOSCOPY     8 days ago  . KNEE ARTHROSCOPY Left 6-7 years ago  . LEFT HEART CATH AND CORONARY ANGIOGRAPHY N/A 04/17/2017   Procedure: LEFT HEART CATH AND CORONARY ANGIOGRAPHY;  Surgeon: Troy Sine, MD;  Location: [facility];  Service: Cardiovascular;   Laterality: N/A;  . 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 TONSILLECTOMY    . TOTAL KNEE ARTHROPLASTY Left 10/08/2016   Procedure: LEFT TOTAL KNEE ARTHROPLASTY;  Surgeon: Gaynelle Arabian, MD;  Location: [facility];  Service: Orthopedics;  Laterality: Left;  . TOTAL KNEE ARTHROPLASTY Right 06/17/2017   Procedure: RIGHT TOTAL KNEE ARTHROPLASTY;  Surgeon: Gaynelle Arabian, MD;  Location: [facility];  Service: Orthopedics;  Laterality: Right;   Social History   Socioeconomic History  . Marital status: Married    Spouse name: Not on file  . Number of children: 2  . Years of education: Not on file  . Highest education level: Bachelor's degree (e.g., BA, AB, BS)  Occupational History  . Not on file  Tobacco Use  . Smoking status: Former Smoker    Packs/day: 1.00    Years: 20.00    Pack years: 20.00    Quit date: 10/1987    Years since quitting: 32.1  . Smokeless tobacco: Never Used  . Tobacco comment: quit 30 yeara ago   Vaping Use  . Vaping Use: Never used  Substance and Sexual Activity  . Alcohol use: Yes    Comment: 3 drinks per day- wine and scotch  . Drug use: No  . Sexual activity: Yes  Other Topics Concern  . Not on file  Social History Narrative   Lives  at home with his wife   Left handed   Drinks 2-3 cups of caffeine daily (coffee)   Social Determinants of Health   Financial Resource Strain:   . Difficulty of Paying Living Expenses: Not on file  Food Insecurity:   . Worried About Charity fundraiser in the Last Year: Not on file  . Ran Out of Food in the Last Year: Not on file  Transportation Needs:   . Lack of Transportation (Medical): Not on file  . Lack of Transportation (Non-Medical): Not on file  Physical Activity:   . Days of Exercise per Week: Not on file  . Minutes of Exercise per Session: Not on file  Stress:   . Feeling of Stress : Not on file  Social Connections:   . Frequency of Communication with Friends and Family: Not on file  . Frequency of Social  Gatherings with Friends and Family: Not on file  . Attends Religious Services: Not on file  . Active Member of Clubs or Organizations: Not on file  . Attends Archivist Meetings: Not on file  . Marital Status: Not on file   Family History  Problem Relation Age of Onset  . Breast cancer Mother 32  . Prostate cancer Father   . Breast cancer Sister 8  . Neuropathy Neg Hx    No Known Allergies Prior to Admission medications   Medication Sig Start Date End Date Taking? Authorizing Provider  aspirin EC 81 MG tablet Take 81 mg by mouth daily.   Yes [provider]  Cholecalciferol (VITAMIN D3) 50 MCG (2000 UT) capsule Take 2,000 Units by mouth daily.   Yes [provider]  ezetimibe (ZETIA) 10 MG tablet Take 5 mg by mouth daily.   Yes [provider]  metoprolol tartrate (LOPRESSOR) 25 MG tablet TAKE 1 TABLET TWICE A DAY 08/07/19  Yes Richardo Priest, MD  Multiple Vitamin (MULTIVITAMIN) capsule Take 1 capsule by mouth daily.   Yes [provider]  Omega-3 Fatty Acids (FISH OIL) 1000 MG CAPS Take 1,000 mg by mouth daily.   Yes [provider]  pantoprazole (PROTONIX) 40 MG tablet Take 1 tablet (40 mg total) by mouth daily. 04/25/17  Yes Richardo Priest, MD  pravastatin (PRAVACHOL) 20 MG tablet TAKE 1 TABLET BY MOUTH EVERY DAY IN THE EVENING 04/22/19  Yes Park Liter, MD  primidone (MYSOLINE) 50 MG tablet Take one pill up to three times a day 09/09/19  Yes Sater, Nanine Means, MD  telmisartan-hydrochlorothiazide (MICARDIS HCT) 40-12.5 MG tablet TAKE 1 TABLET DAILY 08/07/19  Yes Richardo Priest, MD     Positive ROS: Otherwise negative  All other systems have been reviewed and were otherwise negative with the exception of those mentioned in the HPI and as above.  Physical Exam: Constitutional: Alert, well-appearing, no acute distress Ears: External ears without lesions or tenderness.  He has minimal wax buildup in both ear canals.  This was  cleaned with forceps.  Of note he has bony exostosis in both ear canals as he grew up in the [place].  The TMs are clear bilaterally with good mobility on pneumatic otoscopy.  On hearing screening with a tuning forks he heard well in both ears with AC > BC bilaterally.  Dix-Hallpike testing was negative for any evidence of BPPV. Nasal: External nose without lesions. Septum with mild deformity and mild rhinitis.  Nasal endoscopy was performed in the office today and on nasal endoscopy both middle  meatus regions were clear.  He has slight septal deviation to the left.  But the posterior and anterior ethmoid regions were clear with no drainage noted sphenoid region was clear.  Nasopharynx was clear in both eustachian tubes were widely patent with no obstruction.  No signs of infection on endoscopy. Oral: Lips and gums without lesions. Tongue and palate mucosa without lesions. Posterior oropharynx clear. Neck: No palpable adenopathy or masses Respiratory: Breathing comfortably  Skin: No facial/neck lesions or rash noted.  Nasal/sinus endoscopy  Date/Time: 11/30/2019 1:51 PM Performed by: Rozetta Nunnery, MD Authorized by: Rozetta Nunnery, MD   Consent:    Consent obtained:  Verbal   Consent given by:  Patient Procedure details:    Indications: sino-nasal symptoms     Medication:  Afrin   Instrument: flexible fiberoptic nasal endoscope     Scope location: bilateral nare   Septum:    Deviation: deviated to the left and posterior deviation     Severity of deviation: mild   Sinus:    Right middle meatus: normal     Left middle meatus: normal     Right nasopharynx: normal     Left nasopharynx: normal     Right Eustachian tube orifices: normal     Left Eustachian tube orifices: normal   Comments:     On nasal endoscopy both middle meatus regions were clear with no signs of infection.  Nasopharynx and eustachian tubes area were unobstructed.  No significant postnasal drainage noted  with mucus being clear throughout.    Assessment: Dizziness questionable etiology.  No clinical evidence of BPPV. Mild rhinitis with no clinical evidence of active sinus infection. TMs clear bilaterally  Plan: Discussed with him that he could use a nasal steroid spray such as Nasacort or Flonase 2 sprays each night as this will help with any congestion in the nose sinuses and eustachian tube region although he has normal exam in the office today. Concerning his dizziness if this persist would recommend further evaluation with his neurologist concerning this.   Radene Journey, MD   CC:

## 2019-12-02 DIAGNOSIS — M9902 Segmental and somatic dysfunction of thoracic region: Secondary | ICD-10-CM | POA: Diagnosis not present

## 2019-12-02 DIAGNOSIS — M9901 Segmental and somatic dysfunction of cervical region: Secondary | ICD-10-CM | POA: Diagnosis not present

## 2019-12-02 DIAGNOSIS — M5033 Other cervical disc degeneration, cervicothoracic region: Secondary | ICD-10-CM | POA: Diagnosis not present

## 2019-12-02 DIAGNOSIS — M5134 Other intervertebral disc degeneration, thoracic region: Secondary | ICD-10-CM | POA: Diagnosis not present

## 2019-12-08 ENCOUNTER — Telehealth: Payer: Self-pay

## 2019-12-08 DIAGNOSIS — Z20822 Contact with and (suspected) exposure to covid-19: Secondary | ICD-10-CM | POA: Diagnosis not present

## 2019-12-08 DIAGNOSIS — M5033 Other cervical disc degeneration, cervicothoracic region: Secondary | ICD-10-CM | POA: Diagnosis not present

## 2019-12-08 DIAGNOSIS — M9901 Segmental and somatic dysfunction of cervical region: Secondary | ICD-10-CM | POA: Diagnosis not present

## 2019-12-08 DIAGNOSIS — M5134 Other intervertebral disc degeneration, thoracic region: Secondary | ICD-10-CM | POA: Diagnosis not present

## 2019-12-08 DIAGNOSIS — M9902 Segmental and somatic dysfunction of thoracic region: Secondary | ICD-10-CM | POA: Diagnosis not present

## 2019-12-08 NOTE — Telephone Encounter (Signed)
Called and spoke with wife. Offered appt 01/05/20 but he is having surgery that day per wife. Dr. Felecia Shelling does not have any sooner appt than his current scheduled appt for 01/21/20 at 11:00am. We will keep him scheduled for current appt and advised I will call if any sooner appt become available. She verbalized understanding.

## 2019-12-08 NOTE — Telephone Encounter (Signed)
Pt left a VM asking for a call to schedule an appt with Dr. Felecia Shelling to discuss lightheadedness and nausea.

## 2019-12-11 DIAGNOSIS — R42 Dizziness and giddiness: Secondary | ICD-10-CM | POA: Diagnosis not present

## 2019-12-11 DIAGNOSIS — R110 Nausea: Secondary | ICD-10-CM | POA: Diagnosis not present

## 2019-12-11 DIAGNOSIS — R030 Elevated blood-pressure reading, without diagnosis of hypertension: Secondary | ICD-10-CM | POA: Diagnosis not present

## 2019-12-15 ENCOUNTER — Other Ambulatory Visit: Payer: Self-pay | Admitting: Family Medicine

## 2019-12-15 DIAGNOSIS — M5033 Other cervical disc degeneration, cervicothoracic region: Secondary | ICD-10-CM | POA: Diagnosis not present

## 2019-12-15 DIAGNOSIS — M9902 Segmental and somatic dysfunction of thoracic region: Secondary | ICD-10-CM | POA: Diagnosis not present

## 2019-12-15 DIAGNOSIS — M5134 Other intervertebral disc degeneration, thoracic region: Secondary | ICD-10-CM | POA: Diagnosis not present

## 2019-12-15 DIAGNOSIS — M9901 Segmental and somatic dysfunction of cervical region: Secondary | ICD-10-CM | POA: Diagnosis not present

## 2019-12-15 DIAGNOSIS — R42 Dizziness and giddiness: Secondary | ICD-10-CM

## 2019-12-21 ENCOUNTER — Ambulatory Visit
Admission: RE | Admit: 2019-12-21 | Discharge: 2019-12-21 | Disposition: A | Discharge status: Home / Self Care | Payer: Medicare Other | Source: Ambulatory Visit | Attending: Family Medicine | Admitting: Family Medicine

## 2019-12-21 DIAGNOSIS — R42 Dizziness and giddiness: Secondary | ICD-10-CM

## 2019-12-21 DIAGNOSIS — I6523 Occlusion and stenosis of bilateral carotid arteries: Secondary | ICD-10-CM | POA: Diagnosis not present

## 2019-12-22 DIAGNOSIS — M9902 Segmental and somatic dysfunction of thoracic region: Secondary | ICD-10-CM | POA: Diagnosis not present

## 2019-12-22 DIAGNOSIS — M5134 Other intervertebral disc degeneration, thoracic region: Secondary | ICD-10-CM | POA: Diagnosis not present

## 2019-12-22 DIAGNOSIS — M9901 Segmental and somatic dysfunction of cervical region: Secondary | ICD-10-CM | POA: Diagnosis not present

## 2019-12-22 DIAGNOSIS — M5033 Other cervical disc degeneration, cervicothoracic region: Secondary | ICD-10-CM | POA: Diagnosis not present

## 2019-12-25 DIAGNOSIS — R42 Dizziness and giddiness: Secondary | ICD-10-CM | POA: Diagnosis not present

## 2019-12-25 DIAGNOSIS — G4489 Other headache syndrome: Secondary | ICD-10-CM | POA: Diagnosis not present

## 2019-12-25 DIAGNOSIS — I10 Essential (primary) hypertension: Secondary | ICD-10-CM | POA: Diagnosis not present

## 2019-12-27 DIAGNOSIS — Z23 Encounter for immunization: Secondary | ICD-10-CM | POA: Diagnosis not present

## 2019-12-28 ENCOUNTER — Ambulatory Visit
Admission: RE | Admit: 2019-12-28 | Discharge: 2019-12-28 | Disposition: A | Discharge status: Home / Self Care | Payer: Medicare Other | Source: Ambulatory Visit | Attending: Family Medicine | Admitting: Family Medicine

## 2019-12-28 ENCOUNTER — Other Ambulatory Visit: Payer: Self-pay | Admitting: Family Medicine

## 2019-12-28 DIAGNOSIS — M47812 Spondylosis without myelopathy or radiculopathy, cervical region: Secondary | ICD-10-CM | POA: Diagnosis not present

## 2019-12-28 DIAGNOSIS — M4312 Spondylolisthesis, cervical region: Secondary | ICD-10-CM | POA: Diagnosis not present

## 2019-12-28 DIAGNOSIS — M4182 Other forms of scoliosis, cervical region: Secondary | ICD-10-CM | POA: Diagnosis not present

## 2019-12-28 DIAGNOSIS — M4802 Spinal stenosis, cervical region: Secondary | ICD-10-CM | POA: Diagnosis not present

## 2019-12-28 DIAGNOSIS — R52 Pain, unspecified: Secondary | ICD-10-CM

## 2019-12-30 DIAGNOSIS — M9902 Segmental and somatic dysfunction of thoracic region: Secondary | ICD-10-CM | POA: Diagnosis not present

## 2019-12-30 DIAGNOSIS — M5033 Other cervical disc degeneration, cervicothoracic region: Secondary | ICD-10-CM | POA: Diagnosis not present

## 2019-12-30 DIAGNOSIS — M9901 Segmental and somatic dysfunction of cervical region: Secondary | ICD-10-CM | POA: Diagnosis not present

## 2019-12-30 DIAGNOSIS — M5134 Other intervertebral disc degeneration, thoracic region: Secondary | ICD-10-CM | POA: Diagnosis not present

## 2020-01-09 ENCOUNTER — Other Ambulatory Visit: Payer: Self-pay | Admitting: Cardiology

## 2020-01-11 NOTE — Telephone Encounter (Signed)
Called pt. Offered appt today at 1pm with Dr. Felecia Shelling. He was on cx list. Pt declined, unable to accept. Advised I will call if anything else opens in the future. He verbalized understanding.

## 2020-01-12 NOTE — Telephone Encounter (Signed)
Called pt and offered appt tomorrow at 330pm with Dr. Felecia Shelling. Pt declined, he will be out of town.

## 2020-01-18 DIAGNOSIS — Z23 Encounter for immunization: Secondary | ICD-10-CM | POA: Diagnosis not present

## 2020-01-21 ENCOUNTER — Encounter: Payer: Self-pay | Admitting: Neurology

## 2020-01-21 ENCOUNTER — Other Ambulatory Visit: Payer: Self-pay

## 2020-01-21 ENCOUNTER — Ambulatory Visit (INDEPENDENT_AMBULATORY_CARE_PROVIDER_SITE_OTHER): Payer: Medicare Other | Admitting: Neurology

## 2020-01-21 VITALS — BP 158/86 | HR 66 | Ht 72.0 in | Wt 284.5 lb

## 2020-01-21 DIAGNOSIS — H938X3 Other specified disorders of ear, bilateral: Secondary | ICD-10-CM

## 2020-01-21 DIAGNOSIS — R251 Tremor, unspecified: Secondary | ICD-10-CM | POA: Diagnosis not present

## 2020-01-21 DIAGNOSIS — I6523 Occlusion and stenosis of bilateral carotid arteries: Secondary | ICD-10-CM

## 2020-01-21 DIAGNOSIS — H9203 Otalgia, bilateral: Secondary | ICD-10-CM

## 2020-01-21 DIAGNOSIS — G629 Polyneuropathy, unspecified: Secondary | ICD-10-CM | POA: Insufficient documentation

## 2020-01-21 DIAGNOSIS — G4489 Other headache syndrome: Secondary | ICD-10-CM | POA: Diagnosis not present

## 2020-01-21 MED ORDER — LORAZEPAM 0.5 MG PO TABS: 0.5000 mg | ORAL_TABLET | Freq: Three times a day (TID) | ORAL | 1 refills | Status: DC

## 2020-01-21 MED ORDER — MELOXICAM 7.5 MG PO TABS: 7.5000 mg | ORAL_TABLET | Freq: Every day | ORAL | 3 refills | Status: DC

## 2020-01-21 NOTE — Progress Notes (Signed)
[facility]  PATIENT: Todd Chan DOB: 1945/07/14  REFERRING DOCTOR OR PCP:  Crissie Sickles, DO SOURCE: patient, notes from Dr. Jaynee Eagles and PCP  _________________________________   HISTORICAL  CHIEF COMPLAINT:  Chief Complaint  Patient presents with  . Follow-up    RM 12 with wife. Last seen 09/09/19. Here to f/u on tremor, polyneuropathy, numbness.  He has been having issues with being light-headed/nauseous.     HISTORY OF PRESENT ILLNESS:  Todd Chan is a 74 y.o. man with tremors and neuropathy.    Update 01/21/2020: He continues to have a left sided tremor.   Primidone was tried but he had headaches and lightheaded and stopped.   He is not sure if it helped sure.   Since the previous visit, he is experiencing lightheadedness upon standing.  This usually occurs for a minute or 2 and may also occur if he is standing a while.  He feels pressure in his ears while this is occurring.  He is noticing a dull headache that occurs randomly.  Most of the time the headaches only last for an hour or so.  He does note that bright lights bother him when he looks at a monitor for an extended period time.  He has seen ophthalmology a few months ago.   Tylenol has not helped much.    He is noting a sensation that his ears are clogged.  This is sometimes associated with jaw discomfort or pressure on the side of the face.  He has seen ENT and no issues were discovered.  He had nasal endoscopy that showed no evidence of infection.  The nasopharynx and eustachian tubes were unobstructed.  No postnasal drainage was noted during the study.  He has had episodes of nausea that occur on and off throughout the day.  And acid usually help.  Since last visit, he has had some additional tests.  Bilateral carotid ultrasound showed less than 50% stenosis in the carotid arteries around the bulbs bilaterally.  The vertebral artery system had antegrade flow bilaterally.  Of note, BP was  188/97 at the time.  An x-ray of the cervical spine showed mild retrolisthesis of C3 upon C4 due to spondylosis.  There is foraminal narrowing at this level bilaterally.  Lesser foraminal narrowing is noted at C6-C7 due to degenerative changes.   TREMOR AND NEUROPATHY HISTORY: He has had progressively worsening tremor since 2016 on his left side only.  Initially, only the hand was involved and he noted worsening handwriting.   He noted the tremor in his leg a year or two later.    Tremor is worse if with intention and is a little worse if more tense.   He has noted some improvement with relaxing the left shoulder/back. He has been on metoprolol for a few years and did not note any improvement when he started.   Scotch helps some.      He has had numbness in his feet for several years and saw Dr. Jaynee Eagles once in 2019.  He has right > left leg edema.    He feels the numbness and foot/ankle/lower leg swelling have both worsened.   In 2019, SPEP/IEF, SSA/SSB, RF, thiamine, ESR were fine.   He is on metoprolol 25 mg po bid for his heart.   Primidone was tried June 2021.    REVIEW OF SYSTEMS: Constitutional: No fevers, chills, sweats, or change in appetite Eyes: No visual changes, double vision, eye pain Ear, nose and throat:  No hearing loss, ear pain, nasal congestion, sore throat Cardiovascular: No chest pain, palpitations Respiratory: No shortness of breath at rest or with exertion.   No wheezes GastrointestinaI: No nausea, vomiting, diarrhea, abdominal pain, fecal incontinence Genitourinary: No dysuria, urinary retention or frequency.  No nocturia. Musculoskeletal: No neck pain, back pain Integumentary: No rash, pruritus, skin lesions Neurological: as above Psychiatric: No depression at this time.  No anxiety Endocrine: No palpitations, diaphoresis, change in appetite, change in weigh or increased thirst Hematologic/Lymphatic: No anemia, purpura, petechiae. Allergic/Immunologic: No itchy/runny  eyes, nasal congestion, recent allergic reactions, rashes  ALLERGIES: No Known Allergies  HOME MEDICATIONS:  Current Outpatient Medications:  .  aspirin EC 81 MG tablet, Take 81 mg by mouth daily., Disp: , Rfl:  .  Cholecalciferol (VITAMIN D3) 50 MCG (2000 UT) capsule, Take 2,000 Units by mouth daily., Disp: , Rfl:  .  ezetimibe (ZETIA) 10 MG tablet, Take 5 mg by mouth daily., Disp: , Rfl:  .  metoprolol tartrate (LOPRESSOR) 25 MG tablet, TAKE 1 TABLET TWICE A DAY, Disp: 180 tablet, Rfl: 1 .  Multiple Vitamin (MULTIVITAMIN) capsule, Take 1 capsule by mouth daily., Disp: , Rfl:  .  Omega-3 Fatty Acids (FISH OIL) 1000 MG CAPS, Take 1,000 mg by mouth daily., Disp: , Rfl:  .  pantoprazole (PROTONIX) 40 MG tablet, Take 1 tablet (40 mg total) by mouth daily., Disp: 30 tablet, Rfl: 11 .  pravastatin (PRAVACHOL) 20 MG tablet, TAKE 1 TABLET BY MOUTH EVERY DAY IN THE EVENING, Disp: 90 tablet, Rfl: 3 .  telmisartan-hydrochlorothiazide (MICARDIS HCT) 40-12.5 MG tablet, TAKE 1 TABLET DAILY, Disp: 90 tablet, Rfl: 1 .  LORazepam (ATIVAN) 0.5 MG tablet, Take 1 tablet (0.5 mg total) by mouth every 8 (eight) hours., Disp: 90 tablet, Rfl: 1 .  meloxicam (MOBIC) 7.5 MG tablet, Take 1 tablet (7.5 mg total) by mouth daily., Disp: 30 tablet, Rfl: 3  PAST MEDICAL HISTORY: Past Medical History:  Diagnosis Date  . Arthritis   . GERD (gastroesophageal reflux disease)   . Hypertension   . Osteoarthritis of knee   . Painful urination   . Peripheral vascular disease ([place])    bilateral edema     PAST SURGICAL HISTORY: Past Surgical History:  Procedure Laterality Date  . CARDIAC CATHETERIZATION  04/2017  . ESOPHAGOGASTRODUODENOSCOPY ENDOSCOPY     8 days ago  . KNEE ARTHROSCOPY Left 6-7 years ago  . LEFT HEART CATH AND CORONARY ANGIOGRAPHY N/A 04/17/2017   Procedure: LEFT HEART CATH AND CORONARY ANGIOGRAPHY;  Surgeon: Troy Sine, MD;  Location: [facility];  Service: Cardiovascular;  Laterality:  N/A;  . 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 TONSILLECTOMY    . TOTAL KNEE ARTHROPLASTY Left 10/08/2016   Procedure: LEFT TOTAL KNEE ARTHROPLASTY;  Surgeon: Gaynelle Arabian, MD;  Location: [facility];  Service: Orthopedics;  Laterality: Left;  . TOTAL KNEE ARTHROPLASTY Right 06/17/2017   Procedure: RIGHT TOTAL KNEE ARTHROPLASTY;  Surgeon: Gaynelle Arabian, MD;  Location: [facility];  Service: Orthopedics;  Laterality: Right;    FAMILY HISTORY: Family History  Problem Relation Age of Onset  . Breast cancer Mother 83  . Prostate cancer Father   . Breast cancer Sister 39  . Neuropathy Neg Hx     SOCIAL HISTORY:  Social History   Socioeconomic History  . Marital status: Married    Spouse name: Not on file  . Number of children: 2  . Years of education: Not on file  . Highest  education level: Bachelor's degree (e.g., BA, AB, BS)  Occupational History  . Not on file  Tobacco Use  . Smoking status: Former Smoker    Packs/day: 1.00    Years: 20.00    Pack years: 20.00    Quit date: 10/1987    Years since quitting: 32.3  . Smokeless tobacco: Never Used  . Tobacco comment: quit 30 yeara ago   Vaping Use  . Vaping Use: Never used  Substance and Sexual Activity  . Alcohol use: Yes    Comment: 3 drinks per day- wine and scotch  . Drug use: No  . Sexual activity: Yes  Other Topics Concern  . Not on file  Social History Narrative   Lives at home with his wife   Left handed   Drinks 2-3 cups of caffeine daily (coffee)   Social Determinants of Health   Financial Resource Strain:   . Difficulty of Paying Living Expenses: Not on file  Food Insecurity:   . Worried About Charity fundraiser in the Last Year: Not on file  . Ran Out of Food in the Last Year: Not on file  Transportation Needs:   . Lack of Transportation (Medical): Not on file  . Lack of Transportation (Non-Medical): Not on file  Physical Activity:   . Days of Exercise per Week: Not on file  . Minutes of Exercise per  Session: Not on file  Stress:   . Feeling of Stress : Not on file  Social Connections:   . Frequency of Communication with Friends and Family: Not on file  . Frequency of Social Gatherings with Friends and Family: Not on file  . Attends Religious Services: Not on file  . Active Member of Clubs or Organizations: Not on file  . Attends Archivist Meetings: Not on file  . Marital Status: Not on file  Intimate Partner Violence:   . Fear of Current or Ex-Partner: Not on file  . Emotionally Abused: Not on file  . Physically Abused: Not on file  . Sexually Abused: Not on file     PHYSICAL EXAM  Vitals:   01/21/20 1044  BP: (!) 158/86  Pulse: 66  Weight: 284 lb 8 oz (129 kg)  Height: 6' (1.829 m)    Body mass index is 38.59 kg/m.   General: The patient is well-developed and well-nourished and in no acute distress  HEENT:  Head is [place]/AT.  Sclera are anicteric.  Funduscopic exam shows normal optic discs and retinal vessels.  Neck: No carotid bruits are noted.  The neck is nontender.  Cardiovascular: The heart has a regular rate and rhythm with a normal S1 and S2. There were no murmurs, gallops or rubs.    Skin: Extremities are without rash or  edema.  Musculoskeletal:  Back is nontender  Neurologic Exam  Mental status: The patient is alert and oriented x 3 at the time of the examination. The patient has apparent normal recent and remote memory, with an apparently normal attention span and concentration ability.   Speech is normal.  Cranial nerves: Extraocular movements are full. Pupils are equal, round, and reactive to light and accomodation.  Visual fields are full.  Facial symmetry is present. There is good facial sensation to soft touch bilaterally.Facial strength is normal.  Trapezius and sternocleidomastoid strength is normal. No dysarthria is noted. No obvious hearing deficits are noted.  Motor: He has a rapid tremor in the left hand that is worse with  intention  but present at rest.  There is no tremor in the right arm.  There is a mild tremor in the left.  Muscle bulk is normal.   Tone is normal. Strength is  5 / 5 in all 4 extremities except 4+/5 EHL strength in the feet.   Sensory: Sensory testing is intact to pinprick, soft touch and vibration sensation in the arms but reduced pinprick and vibration in the foot.  Vibration sense minimally reduced at the ankle.  Coordination: Cerebellar testing reveals good finger-nose-finger and heel-to-shin bilaterally.  Gait and station: Station is normal.  Gait and tandem gait are normal for age.  He had no retropulsion.  Romberg is negative.   Reflexes: Deep tendon reflexes are symmetric and normal bilaterally.   Plantar responses are flexor.    DIAGNOSTIC DATA (LABS, IMAGING, TESTING) - I reviewed patient records, labs, notes, testing and imaging myself where available.  Lab Results  Component Value Date   WBC 7.8 04/05/2018   HGB 15.5 04/05/2018   HCT 47.3 04/05/2018   MCV 100.9 (H) 04/05/2018   PLT 153 04/05/2018      Component Value Date/Time   NA 142 05/28/2019 1113   K 4.6 05/28/2019 1113   CL 99 05/28/2019 1113   CO2 25 05/28/2019 1113   GLUCOSE 120 (H) 05/28/2019 1113   GLUCOSE 108 (H) 04/05/2018 1724   BUN 13 05/28/2019 1113   CREATININE 0.96 05/28/2019 1113   CALCIUM 10.0 05/28/2019 1113   PROT 7.4 09/09/2019 1543   ALBUMIN 4.7 04/09/2019 1210   AST 34 04/09/2019 1210   ALT 28 04/09/2019 1210   ALKPHOS 40 04/09/2019 1210   BILITOT 1.7 (H) 04/09/2019 1210   GFRNONAA 79 05/28/2019 1113   GFRAA 92 05/28/2019 1113   Lab Results  Component Value Date   CHOL 113 05/11/2019   HDL 50 05/11/2019   LDLCALC 50 05/11/2019   TRIG 60 05/11/2019   CHOLHDL 2.3 05/11/2019        ASSESSMENT AND PLAN  Tremor - Plan: CT HEAD WO CONTRAST  Polyneuropathy  Stuffy ears, bilateral  Otalgia of both ears - Plan: CT HEAD WO CONTRAST  Other headache syndrome - Plan: CT HEAD WO  CONTRAST  Carotid stenosis, asymptomatic, bilateral  1.  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  I will have him try Ativan 0.5 mg 3 times daily to see if tremor helps.  This may also help some of his anxiety. 2.   X-rays show significant degenerative changes at C3-C4 that could be contributing to his headaches.  We will start meloxicam 0.5 mg daily. 3.   He is very claustrophobic and does not think he could do an MRI.  We will check a CT scan of the head due to the headaches and ear pain. 4.   He will return in 6 months but call sooner if he has new or worsening symptoms.  45-minute office visit with the majority of the time spent face-to-face for history and physical, discussion/counseling and decision-making.  Additional time with record review and documentation.   Jonay Hitchcock A. Felecia Shelling, MD, [facility] 95/12/3265, 12:45 AM Certified in Neurology, Clinical Neurophysiology, Sleep Medicine and Neuroimaging  [facility] [address], [place],  [address] [phone]

## 2020-01-25 ENCOUNTER — Telehealth: Payer: Self-pay | Admitting: Neurology

## 2020-01-25 DIAGNOSIS — G4489 Other headache syndrome: Secondary | ICD-10-CM

## 2020-01-25 DIAGNOSIS — M542 Cervicalgia: Secondary | ICD-10-CM

## 2020-01-25 NOTE — Telephone Encounter (Signed)
medicare/[employer] Josem Kaufmann: NPR spoke to [facility] ref # [id number]. Caroline with [facility] will reach out to the patient to schedule.  I also spoke with the patient and he asked about get a spine imaging done?

## 2020-01-25 NOTE — Telephone Encounter (Signed)
Noted, order sent to [facility] will reach out to the patient to schedule.

## 2020-01-25 NOTE — Telephone Encounter (Signed)
Dr. Sater- please advise 

## 2020-01-25 NOTE — Telephone Encounter (Signed)
We can also order a CT scan of the cervical spine without contrast for neck pain and headache

## 2020-01-25 NOTE — Telephone Encounter (Signed)
Called and spoke with pt. Relayed Dr. Garth Bigness message. He is agreeable to this plan. Placed order. He is aware it may need to be authorized via insurance first and then he will be contacted to get scheduled.

## 2020-01-26 ENCOUNTER — Ambulatory Visit (HOSPITAL_BASED_OUTPATIENT_CLINIC_OR_DEPARTMENT_OTHER)
Admission: RE | Admit: 2020-01-26 | Discharge: 2020-01-26 | Disposition: A | Discharge status: Home / Self Care | Payer: Medicare Other | Source: Ambulatory Visit | Attending: Neurology | Admitting: Neurology

## 2020-01-26 ENCOUNTER — Other Ambulatory Visit: Payer: Self-pay

## 2020-01-26 DIAGNOSIS — G4489 Other headache syndrome: Secondary | ICD-10-CM | POA: Diagnosis not present

## 2020-01-26 DIAGNOSIS — R251 Tremor, unspecified: Secondary | ICD-10-CM | POA: Insufficient documentation

## 2020-01-26 DIAGNOSIS — H9203 Otalgia, bilateral: Secondary | ICD-10-CM | POA: Insufficient documentation

## 2020-01-26 DIAGNOSIS — I6782 Cerebral ischemia: Secondary | ICD-10-CM | POA: Diagnosis not present

## 2020-01-26 DIAGNOSIS — M542 Cervicalgia: Secondary | ICD-10-CM

## 2020-01-26 DIAGNOSIS — R519 Headache, unspecified: Secondary | ICD-10-CM | POA: Diagnosis not present

## 2020-01-26 DIAGNOSIS — R42 Dizziness and giddiness: Secondary | ICD-10-CM | POA: Diagnosis not present

## 2020-03-14 ENCOUNTER — Other Ambulatory Visit: Payer: Self-pay | Admitting: Neurology

## 2020-03-14 MED ORDER — METHOCARBAMOL 500 MG PO TABS: 500.0000 mg | ORAL_TABLET | Freq: Three times a day (TID) | ORAL | 3 refills | Status: DC | PRN

## 2020-03-15 ENCOUNTER — Ambulatory Visit: Payer: Medicare Other | Admitting: Cardiovascular Disease

## 2020-04-14 ENCOUNTER — Other Ambulatory Visit: Payer: Self-pay | Admitting: Cardiology

## 2020-04-14 ENCOUNTER — Other Ambulatory Visit: Payer: Self-pay

## 2020-04-14 MED ORDER — EZETIMIBE 10 MG PO TABS
5.0000 mg | ORAL_TABLET | Freq: Every day | ORAL | 3 refills | Status: DC
Start: 2020-04-14 — End: 2020-07-11

## 2020-04-14 MED ORDER — EZETIMIBE 10 MG PO TABS
5.0000 mg | ORAL_TABLET | Freq: Every day | ORAL | 3 refills | Status: DC
Start: 2020-04-14 — End: 2020-04-14

## 2020-04-14 NOTE — Telephone Encounter (Signed)
Refills of Zetia sent to [facility] per the patients request.

## 2020-04-22 MED ORDER — PRAVASTATIN SODIUM 20 MG PO TABS: ORAL_TABLET | ORAL | 3 refills | Status: DC

## 2020-05-24 DIAGNOSIS — I10 Essential (primary) hypertension: Secondary | ICD-10-CM | POA: Diagnosis not present

## 2020-05-24 DIAGNOSIS — G8929 Other chronic pain: Secondary | ICD-10-CM | POA: Diagnosis not present

## 2020-05-24 DIAGNOSIS — R519 Headache, unspecified: Secondary | ICD-10-CM | POA: Diagnosis not present

## 2020-05-24 DIAGNOSIS — I712 Thoracic aortic aneurysm, without rupture: Secondary | ICD-10-CM | POA: Diagnosis not present

## 2020-05-24 DIAGNOSIS — E785 Hyperlipidemia, unspecified: Secondary | ICD-10-CM | POA: Diagnosis not present

## 2020-05-24 DIAGNOSIS — R1031 Right lower quadrant pain: Secondary | ICD-10-CM | POA: Diagnosis not present

## 2020-05-26 ENCOUNTER — Other Ambulatory Visit: Payer: Self-pay | Admitting: Family Medicine

## 2020-05-26 DIAGNOSIS — R1031 Right lower quadrant pain: Secondary | ICD-10-CM

## 2020-06-03 ENCOUNTER — Other Ambulatory Visit: Payer: Self-pay

## 2020-06-03 ENCOUNTER — Ambulatory Visit (HOSPITAL_BASED_OUTPATIENT_CLINIC_OR_DEPARTMENT_OTHER)
Admission: RE | Admit: 2020-06-03 | Discharge: 2020-06-03 | Disposition: A | Discharge status: Home / Self Care | Payer: Medicare Other | Source: Ambulatory Visit | Attending: Family Medicine | Admitting: Family Medicine

## 2020-06-03 DIAGNOSIS — K429 Umbilical hernia without obstruction or gangrene: Secondary | ICD-10-CM | POA: Diagnosis not present

## 2020-06-03 DIAGNOSIS — R1031 Right lower quadrant pain: Secondary | ICD-10-CM | POA: Insufficient documentation

## 2020-06-03 DIAGNOSIS — I878 Other specified disorders of veins: Secondary | ICD-10-CM | POA: Diagnosis not present

## 2020-06-03 DIAGNOSIS — I708 Atherosclerosis of other arteries: Secondary | ICD-10-CM | POA: Diagnosis not present

## 2020-06-03 DIAGNOSIS — K8020 Calculus of gallbladder without cholecystitis without obstruction: Secondary | ICD-10-CM | POA: Diagnosis not present

## 2020-06-28 ENCOUNTER — Other Ambulatory Visit: Payer: Self-pay

## 2020-06-28 ENCOUNTER — Ambulatory Visit (HOSPITAL_BASED_OUTPATIENT_CLINIC_OR_DEPARTMENT_OTHER)
Admission: RE | Admit: 2020-06-28 | Discharge: 2020-06-28 | Disposition: A | Discharge status: Home / Self Care | Payer: Medicare Other | Source: Ambulatory Visit | Attending: Cardiology | Admitting: Cardiology

## 2020-06-28 DIAGNOSIS — I2510 Atherosclerotic heart disease of native coronary artery without angina pectoris: Secondary | ICD-10-CM | POA: Diagnosis not present

## 2020-06-28 DIAGNOSIS — I700 Atherosclerosis of aorta: Secondary | ICD-10-CM | POA: Diagnosis not present

## 2020-06-28 DIAGNOSIS — J9811 Atelectasis: Secondary | ICD-10-CM | POA: Diagnosis not present

## 2020-06-28 DIAGNOSIS — I712 Thoracic aortic aneurysm, without rupture, unspecified: Secondary | ICD-10-CM

## 2020-07-06 ENCOUNTER — Encounter (INDEPENDENT_AMBULATORY_CARE_PROVIDER_SITE_OTHER): Payer: Self-pay | Admitting: Ophthalmology

## 2020-07-06 ENCOUNTER — Other Ambulatory Visit: Payer: Self-pay

## 2020-07-06 ENCOUNTER — Ambulatory Visit (INDEPENDENT_AMBULATORY_CARE_PROVIDER_SITE_OTHER): Payer: Medicare Other | Admitting: Ophthalmology

## 2020-07-06 DIAGNOSIS — H2513 Age-related nuclear cataract, bilateral: Secondary | ICD-10-CM | POA: Insufficient documentation

## 2020-07-06 DIAGNOSIS — H34812 Central retinal vein occlusion, left eye, with macular edema: Secondary | ICD-10-CM | POA: Insufficient documentation

## 2020-07-06 DIAGNOSIS — Z23 Encounter for immunization: Secondary | ICD-10-CM | POA: Diagnosis not present

## 2020-07-06 MED ORDER — BEVACIZUMAB 2.5 MG/0.1ML IZ SOSY
2.5000 mg | PREFILLED_SYRINGE | INTRAVITREAL | Status: AC | PRN
  Administered 2020-07-06: 2.5 mg via INTRAVITREAL

## 2020-07-06 NOTE — Assessment & Plan Note (Signed)
Central darkening of the nucleus at 2-3+ yet with outer cortical clarity.  This type of nuclear sclerosis can disguise itself as indistinct or not visually significant cataract to casual slit-lamp examination.  Nonetheless this type of darkening can affect patients on bright some sunny days and/or dark nights and/or dark rainy days with clouding of vision.  Neck surgery is based upon his symptoms and and evaluation and  consultation with Dr. Cecilie Lowers would

## 2020-07-06 NOTE — Patient Instructions (Signed)
Central Retinal Vein Occlusion, Adult  Central retinal vein occlusion (CRVO) is a blockage in the main blood vessel that carries blood away from the retina (central retinal vein). The retina is the part of your eye that senses light and sends signals to the brain that allow you to see. CRVO can cause blurry vision. It can also cause complete or partial vision loss. The condition usually affects only one eye. What are the causes? This condition is usually caused by a blood clot that forms inside the central retinal vein. A clot is blood that has thickened into a gel or solid. Vision loss happens because the blockage in the vein causes fluid to leak out of the vein and collect in the retina. A clot can block the central retinal vein because of:  A narrowing of the arteries.  A slowing of the bloodstream.  Changes in the blood vessel wall.  Changes in the blood's thickness. Sometimes the cause is not known. What increases the risk? This condition is more likely to develop in:  People who have a blood vessel disease that causes narrowing of the arteries. This is the main risk factor for this condition.  People who are age 16 or older.  Women who are using oral contraceptive pills.  People who have any of the following medical conditions: ? High blood pressure. ? Diabetes. ? Heart disease. ? High levels of fat in the blood. ? Increased pressure inside the eye. ? Disorders that increase blood clotting. What are the signs or symptoms? Symptoms of this condition include:  Sudden and painless vision loss. If the vision loss is partial, it may get worse over a span of hours or days.  Sudden and painless blurry vision.  Tiny spots or clumps that move across your vision.  Pain or pressure in your eye. This is rare. How is this diagnosed? This condition is usually diagnosed by a health care provider who specializes in eye diseases (ophthalmologist). This condition may be diagnosed based  on an eye exam. The health care provider may:  Use eye drops to help check your eyes.  Order tests that help diagnose the condition, such as: ? A vision test. ? A measurement of the pressure inside your eye. ? A fluorescein angiogram. In this test, pictures are taken of your retina after a dye is injected into your bloodstream. ? Optical coherence tomography. In this test, light waves are used to create pictures of your retina.  Check your blood pressure.  Order tests to find the cause of your condition and rule out other conditions. The tests may check for these problems: ? Diabetes. ? High levels of fat or cholesterol in your blood. ? Abnormal blood clotting. How is this treated? There is no cure for this condition. The goals of treatment are to save as much of your vision as possible and to prevent the condition from happening again. Treatment may include:  Medicine to reduce swelling in your eye. The medicine may be injected in your eye, or an implant may be placed in your eye to release the medicine.  Medicine to reduce abnormal fluid and new blood vessel growth in your eye. The medicine is injected in your eye.  Laser treatment to reduce swelling and stop fluid leakage in the eye (focal laser treatment).  Laser treatment to seal or destroy abnormal blood vessels (pan-retinal laser treatment). Follow these instructions at home:  Use over-the-counter and prescription medicines only as told by your health care provider.  Keep all follow-up visits. This is important. How is this prevented? To help prevent this condition:  Work with your health care providers to reduce your risk factors.  Do not use any products that contain nicotine or tobacco. These products include cigarettes, chewing tobacco, and vaping devices, such as e-cigarettes. If you need help quitting, ask your health care provider.  Follow a heart-healthy diet. This includes eating plenty of fruits, vegetables,  whole grains, and lean protein, and avoiding foods that are high in saturated fat and sugar.  Maintain a healthy weight.  Exercise regularly. Get help right away if:  You have eye pain.  You have symptoms of this condition, including blurry vision or floaters.  You have new or sudden vision loss. Summary  Central retinal vein occlusion (CRVO) is a blockage in the main blood vessel that carries blood away from the retina (central retinal vein). The retina is the part of your eye that senses light and sends signals to the brain that allow you to see.  CRVO can cause blurry vision. It can also cause complete or partial vision loss. The condition usually affects only one eye.  There is no cure for this condition. The goals of treatment are to save as much of your vision as possible and to prevent the condition from happening again.  Get help right away if you have new or sudden vision loss. This information is not intended to replace advice given to you by your health care provider. Make sure you discuss any questions you have with your health care provider. Document Revised: 01/05/2020 Document Reviewed: 01/05/2020 Elsevier Patient Education  2021 [employer].

## 2020-07-06 NOTE — Assessment & Plan Note (Signed)
The nature of central retinal vein occlusion was discussed with the patient including the division of types into nonischemic ischemic. The potential sequelae of ischemic central retinal vein occlusion, including macular edema, neovascularization, rubeosis iridis, and neovascular glaucoma, were discussed, and the need for frequent follow-up.  The nature of macular edema and central retinal vein occlusion was discussed. The following options were considered:  1.Observation for a period to look for spontaneous improvement, is no linger the primary therapy. One-third worsen, one-third stay unchanged, and one-third improves.  2. Anti-VEGF Therapy. ( Lucentis, Avastin or Eylea ) injected  in intravitreal fashion, initially monthly then tailored to clinical response.  3. Intravitreal steroid usage, Kenalog, or Ozurdex, usually a second line therapy or in combination with anti-Vegf therapy noted above.  4. Panretinal laser photocoagulation to cause regression of iris neovascularization, or treat retinal  non-perfusion.  5. Surgical Management may include vitrectomy with incisions of peripheral veins to trigger retino choroidal anastomosis formation. This topic presented and discussed at [place].   Hemi central retinal vein occlusion superiorly left eye with macular edema encroaching near the superior portion of the macula but sparing currently The center of the vision in the left eye.  However significant intraretinal hemorrhage present in the superior hemifield in the macular region and in the entire retina.

## 2020-07-06 NOTE — Progress Notes (Signed)
07/06/2020     CHIEF COMPLAINT Patient presents for Retina Evaluation (NP RVO with macular edema OS - Ref'd by Dr. Cecilie Lowers [place]//Pt c/o gradual decrease in [place] at near and distance OU x 3 months approx. Pt sts detailed VA is getting harder to see OU due to cataracts. Pt c/o lid droop OS. No FOL or floaters OU. No ocular pain OU.)   HISTORY OF PRESENT ILLNESS: Todd Chan is a 75 y.o. male who presents to the clinic today for:   HPI    Retina Evaluation    In both eyes.  This started 3 months ago.  Duration of 3 months.  Context:  distance vision and near vision.  Treatments tried include no treatments. Additional comments: NP RVO with macular edema OS - Ref'd by Dr. Barbie Haggis  Pt c/o gradual decrease in [place] at near and distance OU x 3 months approx. Pt sts detailed VA is getting harder to see OU due to cataracts. Pt c/o lid droop OS. No FOL or floaters OU. No ocular pain OU.       Last edited by Rockie Neighbours, [place] on 07/06/2020  8:56 AM. (History)      Referring physician: Lyman Bishop, DO [place],  [place] [phone]  HISTORICAL INFORMATION:   Selected notes from the MEDICAL RECORD NUMBER       CURRENT MEDICATIONS: No current outpatient medications on file. (Ophthalmic Drugs)   No current facility-administered medications for this visit. (Ophthalmic Drugs)   Current Outpatient Medications (Other)  Medication Sig  . aspirin EC 81 MG tablet Take 81 mg by mouth daily.  . Cholecalciferol (VITAMIN D3) 50 MCG (2000 UT) capsule Take 2,000 Units by mouth daily.  Marland Kitchen ezetimibe (ZETIA) 10 MG tablet Take 0.5 tablets (5 mg total) by mouth daily.  Marland Kitchen LORazepam (ATIVAN) 0.5 MG tablet Take 1 tablet (0.5 mg total) by mouth every 8 (eight) hours.  . meloxicam (MOBIC) 7.5 MG tablet Take 1 tablet (7.5 mg total) by mouth daily.  . methocarbamol (ROBAXIN) 500 MG tablet Take 1 tablet (500 mg total) by mouth every 8 (eight) hours as needed for muscle spasms.  . metoprolol  tartrate (LOPRESSOR) 25 MG tablet TAKE 1 TABLET TWICE A DAY  . Multiple Vitamin (MULTIVITAMIN) capsule Take 1 capsule by mouth daily.  . Omega-3 Fatty Acids (FISH OIL) 1000 MG CAPS Take 1,000 mg by mouth daily.  . pantoprazole (PROTONIX) 40 MG tablet Take 1 tablet (40 mg total) by mouth daily.  . pravastatin (PRAVACHOL) 20 MG tablet TAKE 1 TABLET EVERY EVENING  . telmisartan-hydrochlorothiazide (MICARDIS HCT) 40-12.5 MG tablet TAKE 1 TABLET DAILY   No current facility-administered medications for this visit. (Other)      REVIEW OF SYSTEMS:    ALLERGIES No Known Allergies  PAST MEDICAL HISTORY Past Medical History:  Diagnosis Date  . Arthritis   . GERD (gastroesophageal reflux disease)   . Hypertension   . Osteoarthritis of knee   . Painful urination   . Peripheral vascular disease ([place])    bilateral edema    Past Surgical History:  Procedure Laterality Date  . CARDIAC CATHETERIZATION  04/2017  . ESOPHAGOGASTRODUODENOSCOPY ENDOSCOPY     8 days ago  . KNEE ARTHROSCOPY Left 6-7 years ago  . LEFT HEART CATH AND CORONARY ANGIOGRAPHY N/A 04/17/2017   Procedure: LEFT HEART CATH AND CORONARY ANGIOGRAPHY;  Surgeon: Troy Sine, MD;  Location: [facility];  Service: Cardiovascular;  Laterality: N/A;  . 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 TONSILLECTOMY    . TOTAL KNEE ARTHROPLASTY Left 10/08/2016   Procedure: LEFT TOTAL KNEE ARTHROPLASTY;  Surgeon: Gaynelle Arabian, MD;  Location: [facility];  Service: Orthopedics;  Laterality: Left;  . TOTAL KNEE ARTHROPLASTY Right 06/17/2017   Procedure: RIGHT TOTAL KNEE ARTHROPLASTY;  Surgeon: Gaynelle Arabian, MD;  Location: [facility];  Service: Orthopedics;  Laterality: Right;    FAMILY HISTORY Family History  Problem Relation Age of Onset  . Breast cancer Mother 57  . Prostate cancer Father   . Breast cancer Sister 80  . Neuropathy Neg Hx     SOCIAL HISTORY Social History   Tobacco Use  . Smoking status: Former Smoker     Packs/day: 1.00    Years: 20.00    Pack years: 20.00    Quit date: 10/1987    Years since quitting: 32.7  . Smokeless tobacco: Never Used  . Tobacco comment: quit 30 yeara ago   Vaping Use  . Vaping Use: Never used  Substance Use Topics  . Alcohol use: Yes    Comment: 3 drinks per day- wine and scotch  . Drug use: No         OPHTHALMIC EXAM:  Base Eye Exam    Visual Acuity (ETDRS)      Right Left   Dist cc 20/50near CL +2 20/20 -2   Dist ph cc 20/30 -2    Correction: Contacts  RGP CLs       Tonometry (Tonopen, 8:56 AM)      Right Left   Pressure 23 21       Pupils      Pupils Dark Light Shape React APD   Right PERRL 4 3 Round Brisk None   Left PERRL 4 3 Round Brisk None       Visual Fields (Counting fingers)      Left Right    Full Full       Extraocular Movement      Right Left    Full Full       Neuro/Psych    Oriented x3: Yes   Mood/Affect: Normal       Dilation    Both eyes: 1.0% Mydriacyl, 2.5% Phenylephrine @ 9:01 AM        Slit Lamp and Fundus Exam    External Exam      Right Left   External Normal Normal       Slit Lamp Exam      Right Left   Lids/Lashes Normal Normal   Conjunctiva/Sclera White and quiet White and quiet   Cornea Clear Clear   Anterior Chamber Deep and quiet Deep and quiet   Iris Round and reactive Round and reactive   Lens 2+ Nuclear sclerosis antral color change and sclerosis yet outer clear cortex 2+ Nuclear sclerosis antral color change and sclerosis yet outer clear cortex   Anterior Vitreous Normal Normal       Fundus Exam      Right Left   Posterior Vitreous Normal Normal   Disc  Hemorrhage at the nerve secondary to superior hemi-CRV O   C/D Ratio 0.1 0.1   Macula Normal Intraretinal hemorrhage superior hemimacula   Vessels Normal Superior Hemi central retinal vein occlusion   Periphery Normal Normal          IMAGING AND PROCEDURES  Imaging and Procedures for 07/06/20  OCT, Retina - OU - Both  Eyes  Right Eye Quality was borderline. Scan locations included subfoveal. Findings include normal foveal contour.   Left Eye Quality was good. Findings include normal foveal contour.   Notes Some medial opacity OD and OS through the undilated pupil nonetheless superior Hemi central retinal vein occlusion with significant retinal thickening superior to the macula OS       Intravitreal Injection, Pharmacologic Agent - OS - Left Eye       Time Out 07/06/2020. 10:17 AM. Confirmed correct patient, procedure, site, and patient consented.   Anesthesia Topical anesthesia was used. Anesthetic medications included [place] 3.5%.   Procedure Preparation included Tobramycin 0.3%, Ofloxacin , 10% betadine to eyelids. A 30 gauge needle was used.   Injection:  2.5 mg Bevacizumab (AVASTIN) 2.5mg /0.88mL SOSY   NDC: 63875-643-32   Route: Intravitreal, Site: Left Eye  Post-op Post injection exam found visual acuity of at least counting fingers. The patient tolerated the procedure well. There were no complications. The patient received written and verbal post procedure care education. Post injection medications were not given.                 ASSESSMENT/PLAN:  Cataract, nuclear sclerotic, both eyes Central darkening of the nucleus at 2-3+ yet with outer cortical clarity.  This type of nuclear sclerosis can disguise itself as indistinct or not visually significant cataract to casual slit-lamp examination.  Nonetheless this type of darkening can affect patients on bright some sunny days and/or dark nights and/or dark rainy days with clouding of vision.  Neck surgery is based upon his symptoms and and evaluation and  consultation with Dr. 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 The potential sequelae  of ischemic central retinal vein occlusion, including macular edema, neovascularization, rubeosis iridis, and neovascular glaucoma, were discussed, and the need for frequent follow-up.  The nature of macular edema and central retinal vein occlusion was discussed. The following options were considered:  1.Observation for a period to look for spontaneous improvement, is no linger the primary therapy. One-third worsen, one-third stay unchanged, and one-third improves.  2. Anti-VEGF Therapy. ( Lucentis, Avastin or Eylea ) injected  in intravitreal fashion, initially monthly then tailored to clinical response.  3. Intravitreal steroid usage, Kenalog, or Ozurdex, usually a second line therapy or in combination with anti-Vegf therapy noted above.  4. Panretinal laser photocoagulation to cause regression of iris neovascularization, or treat retinal  non-perfusion.  5. Surgical Management may include vitrectomy with incisions of peripheral veins to trigger retino choroidal anastomosis formation. This topic presented and discussed at [place].   Hemi central retinal vein occlusion superiorly left eye with macular edema encroaching near the superior portion of the macula but sparing currently The center of the vision in the left eye.  However significant intraretinal hemorrhage present in the superior hemifield in the macular region and in the entire retina.       ICD-10-CM   1. 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 Cataract, nuclear sclerotic, both eyes  H25.13     1.  We will commence with intravitreal Avastin OS today  2.  Typical clinical course and interval of  therapy was reviewed with the patient.  We will have the patient return for follow-up visit in 5 to 6 weeks  3.  Once 2 to 3 injections are completed  in the left eye we have control of the Hemi central retinal vein occlusion and the macular edema present on OCT begins to wane, it is okay to proceed with cataract traction with intraocular lens placement even with multifocal lenses in either eye or both as chosen by the patient and consultation with Dr. Barbie Haggis  Ophthalmic Meds Ordered this visit:  Meds ordered this encounter  Medications  . bevacizumab (AVASTIN) SOSY 2.5 mg       Return in about 5 weeks (around 08/10/2020) for dilate, OS.  Patient Instructions  Central Retinal Vein Occlusion, Adult  Central retinal vein occlusion (CRVO) is a blockage in the main blood vessel that carries blood away from the retina (central retinal vein). The retina is the part of your eye that senses light and sends signals to the brain that allow you to see. CRVO can cause blurry vision. It can also cause complete or partial vision loss. The condition usually affects only one eye. What are the causes? This condition is usually caused by a blood clot that forms inside the central retinal vein. A clot is blood that has thickened into a gel or solid. Vision loss happens because the blockage in the vein causes fluid to leak out of the vein and collect in the retina. A clot can block the central retinal vein because of:  A narrowing of the arteries.  A slowing of the bloodstream.  Changes in the blood vessel wall.  Changes in the blood's thickness. Sometimes the cause is not known. What increases the risk? This condition is more likely to develop in:  People who have a blood vessel disease that causes narrowing of the arteries. This is the main risk factor for this condition.  People who are age 69 or older.  Women who are using oral contraceptive pills.  People who have any of the following medical conditions: ? High blood pressure. ? Diabetes. ? Heart disease. ? High levels of fat in the blood. ? Increased pressure inside the  eye. ? Disorders that increase blood clotting. What are the signs or symptoms? Symptoms of this condition include:  Sudden and painless vision loss. If the vision loss is partial, it may get worse over a span of hours or days.  Sudden and painless blurry vision.  Tiny spots or clumps that move across your vision.  Pain or pressure in your eye. This is rare. How is this diagnosed? This condition is usually diagnosed by a health care provider who specializes in eye diseases (ophthalmologist). This condition may be diagnosed based on an eye exam. The health care provider may:  Use eye drops to help check your eyes.  Order tests that help diagnose the condition, such as: ? A vision test. ? A measurement of the pressure inside your eye. ? A fluorescein angiogram. In this test, pictures are taken of your retina after a dye is injected into your bloodstream. ? Optical coherence tomography. In this test, light waves are used to create pictures of your retina.  Check your blood pressure.  Order tests to find the cause of your condition and rule out other conditions. The tests may check for these problems: ? Diabetes. ? High levels of fat or cholesterol in your blood. ? Abnormal blood clotting. How is this treated? There is  no cure for this condition. The goals of treatment are to save as much of your vision as possible and to prevent the condition from happening again. Treatment may include:  Medicine to reduce swelling in your eye. The medicine may be injected in your eye, or an implant may be placed in your eye to release the medicine.  Medicine to reduce abnormal fluid and new blood vessel growth in your eye. The medicine is injected in your eye.  Laser treatment to reduce swelling and stop fluid leakage in the eye (focal laser treatment).  Laser treatment to seal or destroy abnormal blood vessels (pan-retinal laser treatment). Follow these instructions at home:  Use  over-the-counter and prescription medicines only as told by your health care provider.  Keep all follow-up visits. This is important. How is this prevented? To help prevent this condition:  Work with your health care providers to reduce your risk factors.  Do not use any products that contain nicotine or tobacco. These products include cigarettes, chewing tobacco, and vaping devices, such as e-cigarettes. If you need help quitting, ask your health care provider.  Follow a heart-healthy diet. This includes eating plenty of fruits, vegetables, whole grains, and lean protein, and avoiding foods that are high in saturated fat and sugar.  Maintain a healthy weight.  Exercise regularly. Get help right away if:  You have eye pain.  You have symptoms of this condition, including blurry vision or floaters.  You have new or sudden vision loss. Summary  Central retinal vein occlusion (CRVO) is a blockage in the main blood vessel that carries blood away from the retina (central retinal vein). The retina is the part of your eye that senses light and sends signals to the brain that allow you to see.  CRVO can cause blurry vision. It can also cause complete or partial vision loss. The condition usually affects only one eye.  There is no cure for this condition. The goals of treatment are to save as much of your vision as possible and to prevent the condition from happening again.  Get help right away if you have new or sudden vision loss. This information is not intended to replace advice given to you by your health care provider. Make sure you discuss any questions you have with your health care provider. Document Revised: 01/05/2020 Document Reviewed: 01/05/2020 Elsevier Patient Education  2021 [place] the diagnoses, plan, and follow up with the patient and they expressed understanding.  Patient expressed understanding of the importance of proper follow up care.   Todd Chan M.D. Diseases & Surgery of the Retina and Vitreous Retina [facility] Diabetic [place] 07/06/20     Abbreviations: M myopia (nearsighted); A astigmatism; H hyperopia (farsighted); P presbyopia; Mrx spectacle prescription;  CTL contact lenses; OD right eye; OS left eye; OU both eyes  XT exotropia; ET esotropia; PEK punctate epithelial keratitis; PEE punctate epithelial erosions; DES dry eye syndrome; MGD meibomian gland dysfunction; ATs artificial tears; PFAT's preservative free artificial tears; [place] nuclear sclerotic cataract; PSC posterior subcapsular cataract; ERM epi-retinal membrane; PVD posterior vitreous detachment; RD retinal detachment; DM diabetes mellitus; DR diabetic retinopathy; NPDR non-proliferative diabetic retinopathy; PDR proliferative diabetic retinopathy; CSME clinically significant macular edema; DME diabetic macular edema; dbh dot blot hemorrhages; CWS cotton wool spot; POAG primary open angle glaucoma; C/D cup-to-disc ratio; HVF humphrey visual field; GVF goldmann visual field; OCT optical coherence tomography; IOP intraocular pressure; BRVO Branch retinal vein occlusion; CRVO  central retinal vein occlusion; CRAO central retinal artery occlusion; BRAO branch retinal artery occlusion; RT retinal tear; SB scleral buckle; PPV pars plana vitrectomy; VH Vitreous hemorrhage; PRP panretinal laser photocoagulation; IVK intravitreal kenalog; VMT vitreomacular traction; MH Macular hole;  NVD neovascularization of the disc; NVE neovascularization elsewhere; AREDS age related eye disease study; ARMD age related macular degeneration; POAG primary open angle glaucoma; EBMD epithelial/anterior basement membrane dystrophy; ACIOL anterior chamber intraocular lens; IOL intraocular lens; PCIOL posterior chamber intraocular lens; Phaco/IOL phacoemulsification with intraocular lens placement; [place] photorefractive keratectomy; LASIK laser assisted in situ keratomileusis; HTN hypertension; DM diabetes  mellitus; COPD chronic obstructive pulmonary disease

## 2020-07-07 ENCOUNTER — Ambulatory Visit: Payer: Medicare Other

## 2020-07-09 NOTE — Progress Notes (Signed)
Cardiology Office Note:    Date:  07/11/2020   ID:  Todd Chan, DOB 17-May-1945, MRN [id number]  PCP:  Lyman Bishop, DO  Cardiologist:  Shirlee More, MD    Referring MD: Lyman Bishop, DO    ASSESSMENT:    1. Enlarged thoracic aorta ([place])   2. Essential hypertension   3. Other hyperlipidemia   4. Mild CAD    PLAN:    In order of problems listed above:  1. Stable on repeat imaging school recheck next year continue current antihypertensives 2. Stable BP at target continue current therapy including therapy thiazide diuretic and beta-blocker 3. Stable he is concerned about his treatment lipid-lowering medications and will drop setting at current continue pravastatin with mild coronary atherosclerosis 4. Stable mild coronary atherosclerosis with his GI symptoms are clinically doing well with stop aspirin for the next 6 weeks to see if it results in improvement   Next appointment: 1 year   Medication Adjustments/Labs and Tests Ordered: Current medicines are reviewed at length with the patient today.  Concerns regarding medicines are outlined above.  Orders Placed This Encounter  Procedures  . Comprehensive metabolic panel  . Lipid panel  . EKG 12-Lead   No orders of the defined types were placed in this encounter.   Chief Complaint  Patient presents with  . Follow-up    Thoracic aorta stable on recent CT scan    History of Present Illness:    Todd Chan is a 75 y.o. male with a hx of mild coronary atherosclerosis at left heart cath in October 2019 and enlargement of the ascending aorta 41 mm 06/02/2019 and hypertension last seen 07/08/2019.  Follow-up CT of the chest 06/28/2020 showed stable appearance of the ascending aorta maximum diameter 41 mm.  Incidentally found on CT is cholelithiasis  Compliance with diet, lifestyle and medications: Yes  Is taking less medications would like to continue the same [place] cholesterol Zetia recheck his  lipids today.  He is having nausea has gallstones and is taking low-dose aspirin I told him to stop for the next 6 weeks to see if he has improvement and he is due to see GI or surgery regarding choledocholithiasis.  He is quite bothered by history nursing neurology no chest pain shortness of breath palpitation or syncope.  132/80  Angiography 2019 showed ostial proximal LAD 50% stenosis normal left ventricular ejection fraction left circumflex dominant) with free disease.  2015 duplex abdominal aorta was normal maximal caliber 2.5 cm screening for Medicare. Past Medical History:  Diagnosis Date  . Arthritis   . GERD (gastroesophageal reflux disease)   . Hypertension   . Osteoarthritis of knee   . Painful urination   . Peripheral vascular disease ([place])    bilateral edema     Past Surgical History:  Procedure Laterality Date  . CARDIAC CATHETERIZATION  04/2017  . ESOPHAGOGASTRODUODENOSCOPY ENDOSCOPY     8 days ago  . KNEE ARTHROSCOPY Left 6-7 years ago  . LEFT HEART CATH AND CORONARY ANGIOGRAPHY N/A 04/17/2017   Procedure: LEFT HEART CATH AND CORONARY ANGIOGRAPHY;  Surgeon: Troy Sine, MD;  Location: [facility];  Service: Cardiovascular;  Laterality: N/A;  . 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 TONSILLECTOMY    . TOTAL KNEE ARTHROPLASTY Left 10/08/2016   Procedure: LEFT TOTAL KNEE ARTHROPLASTY;  Surgeon: Gaynelle Arabian, MD;  Location: [facility];  Service: Orthopedics;  Laterality: Left;  . TOTAL KNEE ARTHROPLASTY Right 06/17/2017  Procedure: RIGHT TOTAL KNEE ARTHROPLASTY;  Surgeon: Gaynelle Arabian, MD;  Location: [facility];  Service: Orthopedics;  Laterality: Right;    Current Medications: Current Meds  Medication Sig  . aspirin EC 81 MG tablet Take 81 mg by mouth daily.  . Cholecalciferol (VITAMIN D3) 50 MCG (2000 UT) capsule Take 2,000 Units by mouth daily.  Marland Kitchen ezetimibe (ZETIA) 10 MG tablet Take 0.5 tablets (5 mg total) by mouth daily.  . metoprolol tartrate (LOPRESSOR)  25 MG tablet TAKE 1 TABLET TWICE A DAY  . Multiple Vitamin (MULTIVITAMIN) capsule Take 1 capsule by mouth daily.  . Omega-3 Fatty Acids (FISH OIL) 1000 MG CAPS Take 1,000 mg by mouth daily.  . pravastatin (PRAVACHOL) 20 MG tablet TAKE 1 TABLET EVERY EVENING  . telmisartan-hydrochlorothiazide (MICARDIS HCT) 40-12.5 MG tablet TAKE 1 TABLET DAILY     Allergies:   Patient has no known allergies.   Social History   Socioeconomic History  . Marital status: Married    Spouse name: Not on file  . Number of children: 2  . Years of education: Not on file  . Highest education level: Bachelor's degree (e.g., BA, AB, BS)  Occupational History  . Not on file  Tobacco Use  . Smoking status: Former Smoker    Packs/day: 1.00    Years: 20.00    Pack years: 20.00    Quit date: 10/1987    Years since quitting: 32.8  . Smokeless tobacco: Never Used  . Tobacco comment: quit 30 yeara ago   Vaping Use  . Vaping Use: Never used  Substance and Sexual Activity  . Alcohol use: Yes    Comment: 3 drinks per day- wine and scotch  . Drug use: No  . Sexual activity: Yes  Other Topics Concern  . Not on file  Social History Narrative   Lives at home with his wife   Left handed   Drinks 2-3 cups of caffeine daily (coffee)   Social Determinants of Health   Financial Resource Strain: Not on file  Food Insecurity: Not on file  Transportation Needs: Not on file  Physical Activity: Not on file  Stress: Not on file  Social Connections: Not on file     Family History: The patient's family history includes Breast cancer (age of onset: 29) in his sister; Breast cancer (age of onset: 63) in his mother; Prostate cancer in his father. There is no history of Neuropathy. ROS:   Please see the history of present illness.    All other systems reviewed and are negative.  EKGs/Labs/Other Studies Reviewed:    The following studies were reviewed today:  EKG:  EKG ordered today and personally reviewed.  The ekg  ordered today demonstrates sinus rhythm and is normal  Recent Labs: No results found for requested labs within last 8760 hours.  Recent Lipid Panel    Component Value Date/Time   CHOL 113 05/11/2019 1156   TRIG 60 05/11/2019 1156   HDL 50 05/11/2019 1156   CHOLHDL 2.3 05/11/2019 1156   LDLCALC 50 05/11/2019 1156    Physical Exam:    VS:  BP (!) 150/82 (BP Location: Right Arm, Patient Position: Sitting)   Pulse 96   Ht 6' (1.829 m)   Wt 285 lb 1.3 oz (129.3 kg)   SpO2 97%   BMI 38.66 kg/m     Wt Readings from Last 3 Encounters:  07/11/20 285 lb 1.3 oz (129.3 kg)  01/21/20 284 lb 8 oz (129 kg)  09/22/19 288 lb (130.6 kg)     GEN:  Well nourished, well developed in no acute distress HEENT: Normal NECK: No JVD; No carotid bruits LYMPHATICS: No lymphadenopathy CARDIAC: RRR, no murmurs, rubs, gallops RESPIRATORY:  Clear to auscultation without rales, wheezing or rhonchi  ABDOMEN: Soft, non-tender, non-distended MUSCULOSKELETAL:  No edema; No deformity  SKIN: Warm and dry NEUROLOGIC:  Alert and oriented x 3 PSYCHIATRIC:  Normal affect    Signed, Shirlee More, MD  07/11/2020 9:25 AM    [place]

## 2020-07-11 ENCOUNTER — Encounter: Payer: Self-pay | Admitting: Cardiology

## 2020-07-11 ENCOUNTER — Ambulatory Visit (INDEPENDENT_AMBULATORY_CARE_PROVIDER_SITE_OTHER): Payer: Medicare Other | Admitting: Cardiology

## 2020-07-11 ENCOUNTER — Other Ambulatory Visit: Payer: Self-pay

## 2020-07-11 VITALS — BP 132/80 | HR 96 | Ht 72.0 in | Wt 285.1 lb

## 2020-07-11 DIAGNOSIS — I7789 Other specified disorders of arteries and arterioles: Secondary | ICD-10-CM | POA: Diagnosis not present

## 2020-07-11 DIAGNOSIS — E7849 Other hyperlipidemia: Secondary | ICD-10-CM | POA: Diagnosis not present

## 2020-07-11 DIAGNOSIS — I10 Essential (primary) hypertension: Secondary | ICD-10-CM

## 2020-07-11 DIAGNOSIS — I2510 Atherosclerotic heart disease of native coronary artery without angina pectoris: Secondary | ICD-10-CM

## 2020-07-11 NOTE — Patient Instructions (Signed)
Medication Instructions:  Your physician has recommended you make the following change in your medication:  STOP: Aspirin STOP: Zetia *If you need a refill on your cardiac medications before your next appointment, please call your pharmacy*   Lab Work: Your physician recommends that you return for lab work in: [place], Lipids If you have labs (blood work) drawn today and your tests are completely normal, you will receive your results only by: Marland Kitchen MyChart Message (if you have MyChart) OR . A paper copy in the mail If you have any lab test that is abnormal or we need to change your treatment, we will call you to review the results.   Testing/Procedures: None   Follow-Up: At [facility], you and your health needs are our priority.  As part of our continuing mission to provide you with exceptional heart care, we have created designated Provider Care Teams.  These Care Teams include your primary Cardiologist (physician) and Advanced Practice Providers (APPs -  Physician Assistants and Nurse Practitioners) who all work together to provide you with the care you need, when you need it.  We recommend signing up for the patient portal called "MyChart".  Sign up information is provided on this After Visit Summary.  MyChart is used to connect with patients for Virtual Visits (Telemedicine).  Patients are able to view lab/test results, encounter notes, upcoming appointments, etc.  Non-urgent messages can be sent to your provider as well.   To learn more about what you can do with MyChart, go to [email or web address].    Your next appointment:   1 year(s)  The format for your next appointment:   In Person  Provider:   Shirlee More, MD   Other Instructions

## 2020-07-12 ENCOUNTER — Telehealth: Payer: Self-pay

## 2020-07-12 LAB — COMPREHENSIVE METABOLIC PANEL
ALT: 25 IU/L (ref 0–44)
AST: 26 IU/L (ref 0–40)
Albumin/Globulin Ratio: 1.7 (ref 1.2–2.2)
Albumin: 4.5 g/dL (ref 3.7–4.7)
Alkaline Phosphatase: 34 IU/L — ABNORMAL LOW (ref 44–121)
BUN/Creatinine Ratio: 18 (ref 10–24)
BUN: 15 mg/dL (ref 8–27)
Bilirubin Total: 1.2 mg/dL (ref 0.0–1.2)
CO2: 24 mmol/L (ref 20–29)
Calcium: 9.2 mg/dL (ref 8.6–10.2)
Chloride: 99 mmol/L (ref 96–106)
Creatinine, Ser: 0.85 mg/dL (ref 0.76–1.27)
Globulin, Total: 2.6 g/dL (ref 1.5–4.5)
Glucose: 118 mg/dL — ABNORMAL HIGH (ref 65–99)
Potassium: 4 mmol/L (ref 3.5–5.2)
Sodium: 140 mmol/L (ref 134–144)
Total Protein: 7.1 g/dL (ref 6.0–8.5)
eGFR: 92 mL/min/{1.73_m2} (ref >59)

## 2020-07-12 LAB — LIPID PANEL
Chol/HDL Ratio: 2.8 ratio (ref 0.0–5.0)
Cholesterol, Total: 143 mg/dL (ref 100–199)
HDL: 51 mg/dL (ref >39)
LDL Chol Calc (NIH): 78 mg/dL (ref 0–99)
Triglycerides: 73 mg/dL (ref 0–149)
VLDL Cholesterol Cal: 14 mg/dL (ref 5–40)

## 2020-07-12 NOTE — Telephone Encounter (Addendum)
Spoke with patient regarding results and recommendation.  Patient verbalizes understanding and is agreeable to plan of care. Advised patient to call back with any issues or concerns.  

## 2020-07-12 NOTE — Telephone Encounter (Signed)
-----   Message from Richardo Priest, MD sent at 07/12/2020  7:45 AM EDT ----- Good results no changes

## 2020-07-13 ENCOUNTER — Other Ambulatory Visit: Payer: Self-pay | Admitting: Cardiology

## 2020-07-21 ENCOUNTER — Ambulatory Visit: Payer: Medicare Other | Admitting: Neurology

## 2020-07-27 DIAGNOSIS — R110 Nausea: Secondary | ICD-10-CM | POA: Diagnosis not present

## 2020-07-27 DIAGNOSIS — G8929 Other chronic pain: Secondary | ICD-10-CM | POA: Diagnosis not present

## 2020-07-27 DIAGNOSIS — K8020 Calculus of gallbladder without cholecystitis without obstruction: Secondary | ICD-10-CM | POA: Diagnosis not present

## 2020-07-27 DIAGNOSIS — K429 Umbilical hernia without obstruction or gangrene: Secondary | ICD-10-CM | POA: Diagnosis not present

## 2020-07-27 DIAGNOSIS — R1031 Right lower quadrant pain: Secondary | ICD-10-CM | POA: Diagnosis not present

## 2020-07-27 DIAGNOSIS — R519 Headache, unspecified: Secondary | ICD-10-CM | POA: Diagnosis not present

## 2020-08-08 ENCOUNTER — Ambulatory Visit (INDEPENDENT_AMBULATORY_CARE_PROVIDER_SITE_OTHER): Payer: Medicare Other | Admitting: Ophthalmology

## 2020-08-08 ENCOUNTER — Encounter (INDEPENDENT_AMBULATORY_CARE_PROVIDER_SITE_OTHER): Payer: Self-pay | Admitting: Ophthalmology

## 2020-08-08 ENCOUNTER — Other Ambulatory Visit: Payer: Self-pay

## 2020-08-08 DIAGNOSIS — H2513 Age-related nuclear cataract, bilateral: Secondary | ICD-10-CM | POA: Diagnosis not present

## 2020-08-08 DIAGNOSIS — H34812 Central retinal vein occlusion, left eye, with macular edema: Secondary | ICD-10-CM

## 2020-08-08 MED ORDER — BEVACIZUMAB 2.5 MG/0.1ML IZ SOSY
2.5000 mg | PREFILLED_SYRINGE | INTRAVITREAL | Status: AC | PRN
  Administered 2020-08-08: 2.5 mg via INTRAVITREAL

## 2020-08-08 NOTE — Assessment & Plan Note (Signed)
Okay to proceed with CE IOL OU at any time with no impact on the hemispheric CRV O OS

## 2020-08-08 NOTE — Assessment & Plan Note (Signed)
Scattered intraretinal hemorrhages remain superior portion of the macula but no center involved nor retinal macular edema in this region post Avastin injection #1.  Repeat injection today and examination again in 5 to 6 weeks

## 2020-08-08 NOTE — Progress Notes (Signed)
08/08/2020     CHIEF COMPLAINT Patient presents for Retina Follow Up (5 Wk F/U OS, poss Avastin OS//Pt denies noticeable changes to [place] OU since last visit. Pt denies ocular pain, flashes of light, or floaters OU. //)   HISTORY OF PRESENT ILLNESS: Todd Chan is a 75 y.o. male who presents to the clinic today for:   HPI    Retina Follow Up    Diagnosis: CRVO/BRVO   Laterality: left eye   Onset: 5 weeks ago   Severity: mild   Duration: 5 weeks   Course: stable   Comments: 5 Wk F/U OS, poss Avastin OS  Pt denies noticeable changes to [place] OU since last visit. Pt denies ocular pain, flashes of light, or floaters OU.          Last edited by Rockie Neighbours, [place] on 08/08/2020  2:48 PM. (History)      Referring physician: Lyman Bishop, DO [place],  [place] [phone]  HISTORICAL INFORMATION:   Selected notes from the [place]: No current outpatient medications on file. (Ophthalmic Drugs)   No current facility-administered medications for this visit. (Ophthalmic Drugs)   Current Outpatient Medications (Other)  Medication Sig  . Cholecalciferol (VITAMIN D3) 50 MCG (2000 UT) capsule Take 2,000 Units by mouth daily.  . metoprolol tartrate (LOPRESSOR) 25 MG tablet TAKE 1 TABLET TWICE A DAY  . Multiple Vitamin (MULTIVITAMIN) capsule Take 1 capsule by mouth daily.  . Omega-3 Fatty Acids (FISH OIL) 1000 MG CAPS Take 1,000 mg by mouth daily.  . pravastatin (PRAVACHOL) 20 MG tablet TAKE 1 TABLET EVERY EVENING  . telmisartan-hydrochlorothiazide (MICARDIS HCT) 40-12.5 MG tablet TAKE 1 TABLET DAILY   No current facility-administered medications for this visit. (Other)      REVIEW OF SYSTEMS:    ALLERGIES No Known Allergies  PAST MEDICAL HISTORY Past Medical History:  Diagnosis Date  . Arthritis   . GERD (gastroesophageal reflux disease)   . Hypertension   . Osteoarthritis of knee   . Painful urination    . Peripheral vascular disease ([place])    bilateral edema    Past Surgical History:  Procedure Laterality Date  . CARDIAC CATHETERIZATION  04/2017  . ESOPHAGOGASTRODUODENOSCOPY ENDOSCOPY     8 days ago  . KNEE ARTHROSCOPY Left 6-7 years ago  . LEFT HEART CATH AND CORONARY ANGIOGRAPHY N/A 04/17/2017   Procedure: LEFT HEART CATH AND CORONARY ANGIOGRAPHY;  Surgeon: Troy Sine, MD;  Location: [facility];  Service: Cardiovascular;  Laterality: N/A;  . 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 TONSILLECTOMY    . TOTAL KNEE ARTHROPLASTY Left 10/08/2016   Procedure: LEFT TOTAL KNEE ARTHROPLASTY;  Surgeon: Gaynelle Arabian, MD;  Location: [facility];  Service: Orthopedics;  Laterality: Left;  . TOTAL KNEE ARTHROPLASTY Right 06/17/2017   Procedure: RIGHT TOTAL KNEE ARTHROPLASTY;  Surgeon: Gaynelle Arabian, MD;  Location: [facility];  Service: Orthopedics;  Laterality: Right;    FAMILY HISTORY Family History  Problem Relation Age of Onset  . Breast cancer Mother 1  . Prostate cancer Father   . Breast cancer Sister 42  . Neuropathy Neg Hx     SOCIAL HISTORY Social History   Tobacco Use  . Smoking status: Former Smoker    Packs/day: 1.00    Years: 20.00    Pack years: 20.00    Quit date: 10/1987    Years since  quitting: 32.8  . Smokeless tobacco: Never Used  . Tobacco comment: quit 30 yeara ago   Vaping Use  . Vaping Use: Never used  Substance Use Topics  . Alcohol use: Yes    Comment: 3 drinks per day- wine and scotch  . Drug use: No         OPHTHALMIC EXAM:  Base Eye Exam    Visual Acuity (ETDRS)      Right Left   Dist cc 20/40 near eye -1 20/25 +2   Dist ph cc NI    Correction: Contacts       Tonometry (Tonopen, 2:52 PM)      Right Left   Pressure 14 17       Pupils      Pupils Dark Light Shape React APD   Right PERRL 4 3 Round Brisk None   Left PERRL 4 3 Round Brisk None       Visual Fields (Counting fingers)      Left Right    Full Full        Extraocular Movement      Right Left    Full Full       Neuro/Psych    Oriented x3: Yes   Mood/Affect: Normal       Dilation    Left eye: 1.0% Mydriacyl, 2.5% Phenylephrine @ 2:52 PM        Slit Lamp and Fundus Exam    External Exam      Right Left   External Normal Normal       Slit Lamp Exam      Right Left   Lids/Lashes Normal Normal   Conjunctiva/Sclera White and quiet White and quiet   Cornea Clear Clear   Anterior Chamber Deep and quiet Deep and quiet   Iris Round and reactive Round and reactive   Lens 2+ Nuclear sclerosis antral color change and sclerosis yet outer clear cortex 2+ Nuclear sclerosis antral color change and sclerosis yet outer clear cortex   Anterior Vitreous Normal Normal       Fundus Exam      Right Left   Posterior Vitreous  Normal   Disc  Hemorrhage at the nerve secondary to superior hemi-CRV O   C/D Ratio  0.1   Macula  Intraretinal hemorrhage superior hemimacula, but no CME   Vessels  Superior Hemi central retinal vein occlusion   Periphery  Normal          IMAGING AND PROCEDURES  Imaging and Procedures for 08/08/20  OCT, Retina - OU - Both Eyes       Right Eye Quality was good. Scan locations included subfoveal. Central Foveal Thickness: 304. Progression has been stable. Findings include normal foveal contour.   Left Eye Quality was good. Scan locations included subfoveal. Central Foveal Thickness: 312. Progression has improved. Findings include abnormal foveal contour.   Notes Much less intraretinal edema in the superior portion of macula yet still present on OCT without intraretinal fluid.       Intravitreal Injection, Pharmacologic Agent - OS - Left Eye       Time Out 08/08/2020. 3:10 PM. Confirmed correct patient, procedure, site, and patient consented.   Anesthesia Topical anesthesia was used. Anesthetic medications included Akten 3.5%.   Procedure Preparation included Tobramycin 0.3%, Ofloxacin , 10% betadine  to eyelids. A 30 gauge needle was used.   Injection:  2.5 mg Bevacizumab (AVASTIN) 2.5mg /0.80mL SOSY   NDC: [id number], Lot: [id number]  Route: Intravitreal, Site: Left Eye  Post-op Post injection exam found visual acuity of at least counting fingers. The patient tolerated the procedure well. There were no complications. The patient received written and verbal post procedure care education. Post injection medications were not given.                 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  Repeat injection today and examination again in 5 to 6 weeks      ICD-10-CM   1. 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 Cataract, nuclear sclerotic, both eyes  H25.13     1.  Improved hemispheric CRV O OS post injection Avastin No. 1.,  Repeat injection Avastin OS today  2.  Okay to begin process of cataract traction with intraocular lens placement in either or both eyes with no impact on the current condition  3.  Ophthalmic Meds Ordered this visit:  Meds ordered this encounter  Medications  . bevacizumab (AVASTIN) SOSY 2.5 mg       Return in about 6 weeks (around 09/19/2020) for dilate, OS, AVASTIN OCT.  There are no Patient Instructions on file for this visit.   Explained the diagnoses, plan, and follow up with the patient and they expressed understanding.  Patient expressed understanding of the importance of proper follow up care.   Clent Demark Evamae Rowen  M.D. Diseases & Surgery of the Retina and Vitreous [facility] [place] 08/08/20     Abbreviations: M myopia (nearsighted); A astigmatism; H hyperopia (farsighted); P presbyopia; Mrx spectacle prescription;  CTL contact lenses; OD right eye; OS left eye; OU both eyes  XT exotropia; ET esotropia; PEK punctate epithelial keratitis; PEE punctate epithelial erosions; DES dry eye syndrome; MGD meibomian gland dysfunction; ATs artificial tears; PFAT's preservative free artificial tears; [place] nuclear sclerotic cataract; PSC posterior subcapsular cataract; ERM epi-retinal membrane; PVD posterior vitreous detachment; RD retinal detachment; DM diabetes mellitus; DR diabetic retinopathy; NPDR non-proliferative diabetic retinopathy; PDR proliferative diabetic retinopathy; CSME clinically significant macular edema; DME diabetic macular edema; dbh dot blot hemorrhages; CWS cotton wool spot; POAG primary open angle glaucoma; C/D cup-to-disc ratio; HVF humphrey visual field; GVF goldmann visual field; OCT optical coherence tomography; IOP intraocular pressure; BRVO Branch retinal vein occlusion; CRVO central retinal vein occlusion; CRAO central retinal artery occlusion; BRAO branch retinal artery occlusion; RT retinal tear; SB scleral buckle; PPV pars plana vitrectomy; VH Vitreous hemorrhage; PRP panretinal laser photocoagulation; IVK intravitreal kenalog; VMT vitreomacular traction; MH Macular hole;  NVD neovascularization of the disc; NVE neovascularization elsewhere; AREDS age related eye disease study; ARMD age related macular degeneration; POAG primary open angle glaucoma; EBMD epithelial/anterior basement membrane dystrophy; ACIOL anterior chamber intraocular lens; IOL intraocular lens; PCIOL posterior chamber intraocular lens; Phaco/IOL phacoemulsification with intraocular lens placement; [place] photorefractive keratectomy; LASIK laser assisted in situ keratomileusis; HTN hypertension; DM diabetes mellitus; COPD  chronic obstructive pulmonary disease

## 2020-08-09 ENCOUNTER — Ambulatory Visit (INDEPENDENT_AMBULATORY_CARE_PROVIDER_SITE_OTHER): Payer: Medicare Other | Admitting: Neurology

## 2020-08-09 ENCOUNTER — Encounter: Payer: Self-pay | Admitting: Neurology

## 2020-08-09 VITALS — BP 158/92 | HR 71 | Ht 72.0 in | Wt 288.5 lb

## 2020-08-09 DIAGNOSIS — R519 Headache, unspecified: Secondary | ICD-10-CM

## 2020-08-09 DIAGNOSIS — R200 Anesthesia of skin: Secondary | ICD-10-CM | POA: Diagnosis not present

## 2020-08-09 DIAGNOSIS — R110 Nausea: Secondary | ICD-10-CM

## 2020-08-09 DIAGNOSIS — M542 Cervicalgia: Secondary | ICD-10-CM

## 2020-08-09 DIAGNOSIS — I2510 Atherosclerotic heart disease of native coronary artery without angina pectoris: Secondary | ICD-10-CM | POA: Diagnosis not present

## 2020-08-09 DIAGNOSIS — M47812 Spondylosis without myelopathy or radiculopathy, cervical region: Secondary | ICD-10-CM | POA: Diagnosis not present

## 2020-08-09 DIAGNOSIS — R251 Tremor, unspecified: Secondary | ICD-10-CM | POA: Diagnosis not present

## 2020-08-09 MED ORDER — METOCLOPRAMIDE HCL 5 MG PO TABS: ORAL_TABLET | ORAL | 5 refills | Status: DC

## 2020-08-09 NOTE — Progress Notes (Signed)
[facility]  PATIENT: Todd Chan DOB: [birth year]  REFERRING DOCTOR OR PCP:  Crissie Sickles, DO SOURCE: patient, notes from Dr. Jaynee Eagles and PCP  _________________________________   HISTORICAL  CHIEF COMPLAINT:  Chief Complaint  Patient presents with  . Follow-up    RM 14 w/ wife. Last seen 01/21/2020. No recent falls.    HISTORY OF PRESENT ILLNESS:  [name] is a 76 y.o. man with tremors and neuropathy.    Update 08/09/2020: He continues to have a left sided tremor.   Ativan was tried after last visit.   He did nit see a benefit and stopped.   Primidone was tried but he had headaches and lightheaded and stopped.  It did not seem to help noticeably.    He is also on metoprolol 25 mg po bid (sees cardiology for an aortic tear) but does not feel it has helped any,        He is experiencing lightheadedness upon standing.  This usually occurs for a minute or 2 and may also occur if he is standing a while.  He feels pressure in his ears while this is occurring.    He has headaches 5/7 mornings and sometimes during the day.   These are often associated with nausea.   Pain is left > right.   Neck is sometimes stiff.   Most of the time the headaches only last for an hour or so.  Nausea this morning lasted 20 minutes - which is typical.  Ginger Ale helps.  If he wakes up without a headache, there is no nausea.   Ondansetron had not helped.   Tylenol has not helped much.   Bright lights do not worsen pain,     CT head was normal for age.   CT cervical showed some DJD with moderate foraminal narrowing at C3C4 but no severe changes.  Due to severe claustrophobia, he has not had MRI imaging.  Bilateral carotid ultrasound showed less than 50% stenosis in the carotid arteries around the bulbs bilaterally.  The vertebral artery system had antegrade flow bilaterally.  Of note, BP was 188/97 at the time.  An x-ray of the cervical spine showed mild retrolisthesis of C3 upon  C4 due to spondylosis.  There is foraminal narrowing at this level bilaterally.  Lesser foraminal narrowing is noted at C6-C7 due to degenerative changes.   TREMOR AND NEUROPATHY HISTORY: He has had progressively worsening tremor since 2016 on his left side only.  Initially, only the hand was involved and he noted worsening handwriting.   He noted the tremor in his leg a year or two later.    Tremor is worse if with intention and is a little worse if more tense.   He has noted some improvement with relaxing the left shoulder/back. He has been on metoprolol for a few years and did not note any improvement when he started.   Scotch helps some.      He has had numbness in his feet for several years and saw Dr. Jaynee Eagles once in 2019.  He has right > left leg edema.    He feels the numbness and foot/ankle/lower leg swelling have both worsened.   In 2019, SPEP/IEF, SSA/SSB, RF, thiamine, ESR were fine.   He is on metoprolol 25 mg po bid for his heart.   Primidone was tried June 2021.    REVIEW OF SYSTEMS: Constitutional: No fevers, chills, sweats, or change in appetite Eyes: No visual changes, double  vision, eye pain Ear, nose and throat: No hearing loss, ear pain, nasal congestion, sore throat Cardiovascular: No chest pain, palpitations Respiratory: No shortness of breath at rest or with exertion.   No wheezes GastrointestinaI: No nausea, vomiting, diarrhea, abdominal pain, fecal incontinence Genitourinary: No dysuria, urinary retention or frequency.  No nocturia. Musculoskeletal: No neck pain, back pain Integumentary: No rash, pruritus, skin lesions Neurological: as above Psychiatric: No depression at this time.  No anxiety Endocrine: No palpitations, diaphoresis, change in appetite, change in weigh or increased thirst Hematologic/Lymphatic: No anemia, purpura, petechiae. Allergic/Immunologic: No itchy/runny eyes, nasal congestion, recent allergic reactions, rashes  ALLERGIES: No Known  Allergies  HOME MEDICATIONS:  Current Outpatient Medications:  .  Cholecalciferol (VITAMIN D3) 50 MCG (2000 UT) capsule, Take 2,000 Units by mouth daily., Disp: , Rfl:  .  Coenzyme Q10 (COQ10 PO), Take 100 mg by mouth daily., Disp: , Rfl:  .  metoCLOPramide (REGLAN) 5 MG tablet, Take one or two at night, Disp: 60 tablet, Rfl: 5 .  metoprolol tartrate (LOPRESSOR) 25 MG tablet, TAKE 1 TABLET TWICE A DAY, Disp: 180 tablet, Rfl: 1 .  Multiple Vitamin (MULTIVITAMIN) capsule, Take 1 capsule by mouth daily., Disp: , Rfl:  .  Omega-3 Fatty Acids (FISH OIL) 1000 MG CAPS, Take 1,000 mg by mouth daily., Disp: , Rfl:  .  pravastatin (PRAVACHOL) 20 MG tablet, TAKE 1 TABLET EVERY EVENING, Disp: 90 tablet, Rfl: 3 .  telmisartan-hydrochlorothiazide (MICARDIS HCT) 40-12.5 MG tablet, TAKE 1 TABLET DAILY, Disp: 90 tablet, Rfl: 1  PAST MEDICAL HISTORY: Past Medical History:  Diagnosis Date  . Arthritis   . GERD (gastroesophageal reflux disease)   . Hypertension   . Osteoarthritis of knee   . Painful urination   . Peripheral vascular disease ([place])    bilateral edema     PAST SURGICAL HISTORY: Past Surgical History:  Procedure Laterality Date  . CARDIAC CATHETERIZATION  04/2017  . ESOPHAGOGASTRODUODENOSCOPY ENDOSCOPY     8 days ago  . KNEE ARTHROSCOPY Left 6-7 years ago  . LEFT HEART CATH AND CORONARY ANGIOGRAPHY N/A 04/17/2017   Procedure: LEFT HEART CATH AND CORONARY ANGIOGRAPHY;  Surgeon: Troy Sine, MD;  Location: [facility];  Service: Cardiovascular;  Laterality: N/A;  . 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 TONSILLECTOMY    . TOTAL KNEE ARTHROPLASTY Left 10/08/2016   Procedure: LEFT TOTAL KNEE ARTHROPLASTY;  Surgeon: Gaynelle Arabian, MD;  Location: [facility];  Service: Orthopedics;  Laterality: Left;  . TOTAL KNEE ARTHROPLASTY Right 06/17/2017   Procedure: RIGHT TOTAL KNEE ARTHROPLASTY;  Surgeon: Gaynelle Arabian, MD;  Location: [facility];  Service: Orthopedics;  Laterality: Right;     FAMILY HISTORY: Family History  Problem Relation Age of Onset  . Breast cancer Mother 42  . Prostate cancer Father   . Breast cancer Sister 23  . Neuropathy Neg Hx     SOCIAL HISTORY:  Social History   Socioeconomic History  . Marital status: Married    Spouse name: Not on file  . Number of children: 2  . Years of education: Not on file  . Highest education level: Bachelor's degree (e.g., BA, AB, BS)  Occupational History  . Not on file  Tobacco Use  . Smoking status: Former Smoker    Packs/day: 1.00    Years: 20.00    Pack years: 20.00    Quit date: 10/1987    Years since quitting: 32.8  . Smokeless tobacco: Never Used  . Tobacco comment:  quit 30 yeara ago   Vaping Use  . Vaping Use: Never used  Substance and Sexual Activity  . Alcohol use: Yes    Comment: 3 drinks per day- wine and scotch  . Drug use: No  . Sexual activity: Yes  Other Topics Concern  . Not on file  Social History Narrative   Lives at home with his wife   Left handed   Drinks 2-3 cups of caffeine daily (coffee)   Social Determinants of Health   Financial Resource Strain: Not on file  Food Insecurity: Not on file  Transportation Needs: Not on file  Physical Activity: Not on file  Stress: Not on file  Social Connections: Not on file  Intimate Partner Violence: Not on file     PHYSICAL EXAM  Vitals:   08/09/20 0906  BP: (!) 158/92  Pulse: 71  Weight: 288 lb 8 oz (130.9 kg)  Height: 6' (1.829 m)    Body mass index is 39.13 kg/m.   General: The patient is well-developed and well-nourished and in no acute distress  HEENT:  Head is [place]/AT.  Sclera are anicteric.  Funduscopic exam shows normal optic discs and retinal vessels.  Neck: No carotid bruits are noted.  The neck is nontender.  Cardiovascular: The heart has a regular rate and rhythm with a normal S1 and S2. There were no murmurs, gallops or rubs.    Skin: Extremities are without rash or  edema.  Neurologic  Exam  Mental status:  The patient is alert and oriented x 3 at the time of the examination. The patient has apparent normal recent and remote memory, with an apparently normal attention span and concentration ability.   Speech is normal.  Cranial nerves: Extraocular movements are full.  Facial strength and sensation was normal.   No dysarthria is noted. No obvious hearing deficits are noted.  Motor: He does not have bradykinesia.  He has a rapid tremor in the left hand that is worse with intention but present at rest.  There is no tremor in the right arm.  There is a mild tremor in the left.  Muscle bulk is normal.   Tone is normal. Strength is  5 / 5 in all 4 extremities except 4+/5 EHL strength in the feet.   Sensory: Sensory testing is intact to pinprick, soft touch and vibration sensation in the arms but reduced pinprick and vibration in the foot.  Vibration sense minimally reduced at the ankle.  Coordination: Cerebellar testing reveals good finger-nose-finger and heel-to-shin bilaterally.  Gait and station: Station is normal.  Gait and tandem gait are normal for age.  There was no retropulsion.  Romberg is negative.   Reflexes: Deep tendon reflexes are symmetric and normal bilaterally.  The    DIAGNOSTIC DATA (LABS, IMAGING, TESTING) - I reviewed patient records, labs, notes, testing and imaging myself where available.  Lab Results  Component Value Date   WBC 7.8 04/05/2018   HGB 15.5 04/05/2018   HCT 47.3 04/05/2018   MCV 100.9 (H) 04/05/2018   PLT 153 04/05/2018      Component Value Date/Time   NA 140 07/11/2020 0929   K 4.0 07/11/2020 0929   CL 99 07/11/2020 0929   CO2 24 07/11/2020 0929   GLUCOSE 118 (H) 07/11/2020 0929   GLUCOSE 108 (H) 04/05/2018 1724   BUN 15 07/11/2020 0929   CREATININE 0.85 07/11/2020 0929   CALCIUM 9.2 07/11/2020 0929   PROT 7.1 07/11/2020 0929   ALBUMIN  4.5 07/11/2020 0929   AST 26 07/11/2020 0929   ALT 25 07/11/2020 0929   ALKPHOS 34 (L)  07/11/2020 0929   BILITOT 1.2 07/11/2020 0929   GFRNONAA 79 05/28/2019 1113   GFRAA 92 05/28/2019 1113   Lab Results  Component Value Date   CHOL 143 07/11/2020   HDL 51 07/11/2020   LDLCALC 78 07/11/2020   TRIG 73 07/11/2020   CHOLHDL 2.8 07/11/2020        ASSESSMENT AND PLAN  Nonintractable episodic headache, unspecified headache type - Plan: MR BRAIN W WO CONTRAST, MR CERVICAL SPINE W WO CONTRAST  Numbness - Plan: MR BRAIN W WO CONTRAST, MR CERVICAL SPINE W WO CONTRAST  Tremor - Plan: MR BRAIN W WO CONTRAST  Nausea - Plan: MR BRAIN W WO CONTRAST  Neck pain - Plan: MR CERVICAL SPINE W WO CONTRAST  Cervical spondylosis - Plan: MR CERVICAL SPINE W WO CONTRAST  1.  His tremor is much more consistent with essential tremor than Parkinson's disease.  Unfortunately, it has not responded to medications.  We did discuss that if it worsens we could refer for evaluation for a deep brain stimulator.  2.   He continues to experience headaches and nausea, especially in the mornings.  Etiology is unclear.  Because the symptoms have worsened we will check an MRI of the brain and cervical spine to further evaluate.  Unfortunately, he is very claustrophobic and this will need to be performed under general anesthesia at [facility] 3.   Metoclopramide at night for morning nausea and headache. 4.   He will return in 6 months but call sooner if he has new or worsening symptoms.  42-minute office visit with the majority of the time spent face-to-face for history and physical, discussion/counseling and decision-making.  Additional time with record review and documentation.   Kylani Wires A. Felecia Shelling, MD, [facility] 12/19/9577, 0:09 PM Certified in Neurology, Clinical Neurophysiology, Sleep Medicine and Neuroimaging  [facility] [address], [place], [place] [address] [phone]

## 2020-08-10 ENCOUNTER — Telehealth: Payer: Self-pay | Admitting: Neurology

## 2020-08-10 NOTE — Telephone Encounter (Signed)
Medicare/Aetna atuh: [employer] aetna plan opted ouf of ot spoke to Shiro Ref # [id number] faxed information to [facility] theyw ill reach out to the patient to schedule

## 2020-08-11 NOTE — Telephone Encounter (Signed)
Patient sedated MRI is scheduled for 08/30/20 at [facility]. H&P form was put nurse Terrence Dupont desk.

## 2020-08-23 ENCOUNTER — Ambulatory Visit: Payer: Medicare Other | Admitting: Neurology

## 2020-08-25 ENCOUNTER — Encounter (HOSPITAL_COMMUNITY): Payer: Self-pay

## 2020-08-25 ENCOUNTER — Other Ambulatory Visit: Payer: Self-pay

## 2020-08-25 NOTE — Progress Notes (Signed)
PCP - Dr. Curly Rim Cardiologist - Dr. Bettina Gavia   Chest x-ray -  EKG - 07/11/20 Stress Test -  ECHO -  Cardiac Cath - 04/17/17   COVID TEST- n/a  Anesthesia review: n/a  -------------  SDW INSTRUCTIONS:  Your procedure is scheduled on 5/31 Tuesday . Please report to [facility] "A" at 07:30A.M., and check in at the Admitting office. Call this number if you have problems the morning of surgery: [phone]   Remember: Do not eat or drink after midnight the night before your surgery   Medications to take morning of surgery with a sip of water include: Metoprolol   As of today, STOP taking any Aspirin (unless otherwise instructed by your surgeon), Aleve, Naproxen, Ibuprofen, Motrin, Advil, Goody's, BC's, all herbal medications, fish oil, and all vitamins.    The Morning of Surgery Do not wear jewelry Do not wear lotions, powders, or colognes, or deodorant Do not shave 48 hours prior to surgery.   Men may shave face and neck. Do not bring valuables to the hospital. [facility] is not responsible for any belongings or valuables. If you are a smoker, DO NOT Smoke 24 hours prior to surgery If you wear a CPAP at night please bring your mask the morning of surgery  Remember that you must have someone to transport you home after your surgery, and remain with you for 24 hours if you are discharged the same day. Please bring cases for contacts, glasses, hearing aids, dentures or bridgework because it cannot be worn into surgery.   Patients discharged the day of surgery will not be allowed to drive home.   Please shower the NIGHT BEFORE SURGERY and the MORNING OF SURGERY with DIAL Soap. Wear comfortable clothes the morning of surgery. Oral Hygiene is also important to reduce your risk of infection.  Remember - BRUSH YOUR TEETH THE MORNING OF SURGERY WITH YOUR REGULAR TOOTHPASTE  Patient denies shortness of breath, fever, cough and chest pain.

## 2020-08-26 ENCOUNTER — Ambulatory Visit (HOSPITAL_COMMUNITY)
Admission: RE | Admit: 2020-08-26 | Discharge: 2020-08-26 | Disposition: A | Discharge status: Home / Self Care | Payer: Medicare Other | Source: Ambulatory Visit | Attending: Neurology | Admitting: Neurology

## 2020-08-26 DIAGNOSIS — Z20822 Contact with and (suspected) exposure to covid-19: Secondary | ICD-10-CM | POA: Insufficient documentation

## 2020-08-26 LAB — SARS CORONAVIRUS 2 (TAT 6-24 HRS): SARS Coronavirus 2: NEGATIVE

## 2020-08-30 ENCOUNTER — Ambulatory Visit (HOSPITAL_COMMUNITY): Admission: RE | Admit: 2020-08-30 | Payer: Medicare Other | Source: Ambulatory Visit

## 2020-08-30 ENCOUNTER — Ambulatory Visit (HOSPITAL_COMMUNITY)
Admission: RE | Admit: 2020-08-30 | Discharge: 2020-08-30 | Disposition: A | Discharge status: Home / Self Care | Payer: Medicare Other | Attending: Neurology | Admitting: Neurology

## 2020-08-30 ENCOUNTER — Encounter (HOSPITAL_COMMUNITY): Admission: RE | Disposition: A | Discharge status: Home / Self Care | Payer: Self-pay | Source: Home / Self Care

## 2020-08-30 ENCOUNTER — Ambulatory Visit (HOSPITAL_COMMUNITY): Payer: Medicare Other | Admitting: Certified Registered Nurse Anesthetist

## 2020-08-30 ENCOUNTER — Ambulatory Visit (HOSPITAL_COMMUNITY)
Admission: RE | Admit: 2020-08-30 | Discharge: 2020-08-30 | Disposition: A | Discharge status: Home / Self Care | Payer: Medicare Other | Source: Ambulatory Visit | Attending: Neurology | Admitting: Neurology

## 2020-08-30 ENCOUNTER — Other Ambulatory Visit: Payer: Self-pay

## 2020-08-30 ENCOUNTER — Encounter (HOSPITAL_COMMUNITY): Payer: Self-pay

## 2020-08-30 DIAGNOSIS — M47812 Spondylosis without myelopathy or radiculopathy, cervical region: Secondary | ICD-10-CM | POA: Diagnosis not present

## 2020-08-30 DIAGNOSIS — M542 Cervicalgia: Secondary | ICD-10-CM

## 2020-08-30 DIAGNOSIS — R251 Tremor, unspecified: Secondary | ICD-10-CM | POA: Diagnosis not present

## 2020-08-30 DIAGNOSIS — R519 Headache, unspecified: Secondary | ICD-10-CM | POA: Insufficient documentation

## 2020-08-30 DIAGNOSIS — I6523 Occlusion and stenosis of bilateral carotid arteries: Secondary | ICD-10-CM | POA: Diagnosis not present

## 2020-08-30 DIAGNOSIS — R200 Anesthesia of skin: Secondary | ICD-10-CM | POA: Diagnosis not present

## 2020-08-30 DIAGNOSIS — M2578 Osteophyte, vertebrae: Secondary | ICD-10-CM | POA: Insufficient documentation

## 2020-08-30 DIAGNOSIS — R110 Nausea: Secondary | ICD-10-CM | POA: Diagnosis not present

## 2020-08-30 DIAGNOSIS — Z87891 Personal history of nicotine dependence: Secondary | ICD-10-CM | POA: Insufficient documentation

## 2020-08-30 DIAGNOSIS — G4489 Other headache syndrome: Secondary | ICD-10-CM | POA: Diagnosis not present

## 2020-08-30 DIAGNOSIS — Z96653 Presence of artificial knee joint, bilateral: Secondary | ICD-10-CM | POA: Insufficient documentation

## 2020-08-30 DIAGNOSIS — R202 Paresthesia of skin: Secondary | ICD-10-CM | POA: Insufficient documentation

## 2020-08-30 DIAGNOSIS — Z79899 Other long term (current) drug therapy: Secondary | ICD-10-CM | POA: Insufficient documentation

## 2020-08-30 DIAGNOSIS — M50223 Other cervical disc displacement at C6-C7 level: Secondary | ICD-10-CM | POA: Diagnosis not present

## 2020-08-30 HISTORY — PX: RADIOLOGY WITH ANESTHESIA: SHX6223

## 2020-08-30 LAB — COMPREHENSIVE METABOLIC PANEL
ALT: 22 U/L (ref 0–44)
AST: 22 U/L (ref 15–41)
Albumin: 3.9 g/dL (ref 3.5–5.0)
Alkaline Phosphatase: 24 U/L — ABNORMAL LOW (ref 38–126)
Anion gap: 7 (ref 5–15)
BUN: 13 mg/dL (ref 8–23)
CO2: 28 mmol/L (ref 22–32)
Calcium: 9 mg/dL (ref 8.9–10.3)
Chloride: 103 mmol/L (ref 98–111)
Creatinine, Ser: 0.89 mg/dL (ref 0.61–1.24)
GFR, Estimated: >60 mL/min (ref >60)
Glucose, Bld: 122 mg/dL — ABNORMAL HIGH (ref 70–99)
Potassium: 4 mmol/L (ref 3.5–5.1)
Sodium: 138 mmol/L (ref 135–145)
Total Bilirubin: 1.6 mg/dL — ABNORMAL HIGH (ref 0.3–1.2)
Total Protein: 6.6 g/dL (ref 6.5–8.1)

## 2020-08-30 SURGERY — MRI WITH ANESTHESIA
Anesthesia: General

## 2020-08-30 MED ORDER — MIDAZOLAM HCL 5 MG/5ML IJ SOLN
INTRAMUSCULAR | Status: DC | PRN
  Administered 2020-08-30 (×2): 1 mg via INTRAVENOUS

## 2020-08-30 MED ORDER — LIDOCAINE 2% (20 MG/ML) 5 ML SYRINGE
INTRAMUSCULAR | Status: DC | PRN
  Administered 2020-08-30: 100 mg via INTRAVENOUS

## 2020-08-30 MED ORDER — GADOBUTROL 1 MMOL/ML IV SOLN
10.0000 mL | Freq: Once | INTRAVENOUS | Status: AC | PRN
  Administered 2020-08-30: 10 mL via INTRAVENOUS

## 2020-08-30 MED ORDER — PROPOFOL 10 MG/ML IV BOLUS
INTRAVENOUS | Status: DC | PRN
  Administered 2020-08-30: 20 mg via INTRAVENOUS
  Administered 2020-08-30: 30 mg via INTRAVENOUS
  Administered 2020-08-30: 150 mg via INTRAVENOUS

## 2020-08-30 MED ORDER — ONDANSETRON HCL 4 MG/2ML IJ SOLN
INTRAMUSCULAR | Status: DC | PRN
  Administered 2020-08-30: 4 mg via INTRAVENOUS

## 2020-08-30 MED ORDER — LACTATED RINGERS IV SOLN: INTRAVENOUS | Status: DC

## 2020-08-30 MED ORDER — CHLORHEXIDINE GLUCONATE 0.12 % MT SOLN
15.0000 mL | Freq: Once | OROMUCOSAL | Status: AC
  Administered 2020-08-30: 15 mL via OROMUCOSAL
  Filled 2020-08-30: qty 15

## 2020-08-30 MED ORDER — ORAL CARE MOUTH RINSE: 15.0000 mL | Freq: Once | OROMUCOSAL | Status: AC

## 2020-08-30 NOTE — Anesthesia Preprocedure Evaluation (Addendum)
Anesthesia Evaluation  Patient identified by MRN, date of birth, ID band Patient awake    Reviewed: Allergy & Precautions, NPO status , Patient's Chart, lab work & pertinent test results  Airway Mallampati: II  TM Distance: >3 FB Neck ROM: Limited    Dental  (+) Teeth Intact, Chipped, Caps, Dental Advisory Given   Pulmonary former smoker,    breath sounds clear to auscultation       Cardiovascular hypertension, Pt. on medications and Pt. on home beta blockers + CAD and + Peripheral Vascular Disease   Rhythm:Regular Rate:Normal  CATH 84'   Ost LAD to Prox LAD lesion is 15% stenosed.  Prox LAD lesion is 10% stenosed.  The left ventricular systolic function is normal.  LV end diastolic pressure is normal.  The left ventricular ejection fraction is 55-65% by visual estimate.      Neuro/Psych  Headaches, Carotid US 21' IMPRESSION: 1. Right carotid artery system: Less than 50% stenosis secondary to atherosclerotic plaque formation.  2. Left carotid artery system: Less than 50% stenosis secondary to atherosclerotic plaque formation.  3.  Vertebral artery system: Patent with antegrade flow bilaterally.   Neuromuscular disease    GI/Hepatic Neg liver ROS, GERD  ,  Endo/Other  Morbid obesity  Renal/GU negative Renal ROS     Musculoskeletal  (+) Arthritis , Osteoarthritis,  Fibromyalgia -  Abdominal (+) + obese,   Peds  Hematology   Anesthesia Other Findings   Reproductive/Obstetrics                           Anesthesia Physical  Anesthesia Plan  ASA: III  Anesthesia Plan: General   Post-op Pain Management:    Induction: Intravenous  PONV Risk Score and Plan: 2 and Treatment may vary due to age or medical condition, Ondansetron, Dexamethasone and Midazolam  Airway Management Planned: Oral ETT and LMA  Additional Equipment: None  Intra-op Plan:   Post-operative Plan:  Extubation in OR  Informed Consent: I have reviewed the patients History and Physical, chart, labs and discussed the procedure including the risks, benefits and alternatives for the proposed anesthesia with the patient or authorized representative who has indicated his/her understanding and acceptance.     Dental advisory given  Plan Discussed with: CRNA and Anesthesiologist  Anesthesia Plan Comments: (  )       Anesthesia Quick Evaluation

## 2020-08-30 NOTE — Transfer of Care (Signed)
Immediate Anesthesia Transfer of Care Note  Patient: Todd Chan  Procedure(s) Performed: MRI WITH ANESTHESIA BRAIN WITH AND WITHOUT AND MRI CERVICAL SPINE WITH AND WITHOUT (N/A )  Patient Location: PACU  Anesthesia Type:General  Level of Consciousness: awake, alert  and oriented  Airway & Oxygen Therapy: Patient Spontanous Breathing and Patient connected to face mask oxygen  Post-op Assessment: Report given to RN and Post -op Vital signs reviewed and stable  Post vital signs: Reviewed and stable  Last Vitals:  Vitals Value Taken Time  BP 142/92 08/30/20 1134  Temp    Pulse 72 08/30/20 1137  Resp 17 08/30/20 1137  SpO2 96 % 08/30/20 1137  Vitals shown include unvalidated device data.  Last Pain:  Vitals:   08/30/20 0846  PainSc: 0-No pain         Complications: No complications documented.

## 2020-08-30 NOTE — Anesthesia Procedure Notes (Signed)
Procedure Name: LMA Insertion Date/Time: 08/30/2020 10:11 AM Performed by: Genelle Bal, CRNA Pre-anesthesia Checklist: Patient identified, Emergency Drugs available, Suction available and Patient being monitored Patient Re-evaluated:Patient Re-evaluated prior to induction Oxygen Delivery Method: Circle system utilized Preoxygenation: Pre-oxygenation with 100% oxygen Induction Type: IV induction Ventilation: Mask ventilation without difficulty LMA: LMA inserted LMA Size: 5.0 Number of attempts: 1 Airway Equipment and Method: Bite block Placement Confirmation: positive ETCO2 Tube secured with: Tape Dental Injury: Teeth and Oropharynx as per pre-operative assessment

## 2020-08-30 NOTE — Anesthesia Postprocedure Evaluation (Signed)
Anesthesia Post Note  Patient: EAGAN SHIFFLETT  Procedure(s) Performed: MRI WITH ANESTHESIA BRAIN WITH AND WITHOUT AND MRI CERVICAL SPINE WITH AND WITHOUT (N/A )     Patient location during evaluation: PACU Anesthesia Type: General Level of consciousness: awake and alert Pain management: pain level controlled Vital Signs Assessment: post-procedure vital signs reviewed and stable Respiratory status: spontaneous breathing, nonlabored ventilation, respiratory function stable and patient connected to nasal cannula oxygen Cardiovascular status: blood pressure returned to baseline and stable Postop Assessment: no apparent nausea or vomiting Anesthetic complications: no   No complications documented.  Last Vitals:  Vitals:   08/30/20 1134 08/30/20 1149  BP: (!) 142/92 (!) 157/86  Pulse: 69 70  Resp: 18 19  Temp:  36.7 C  SpO2: 99% 96%    Last Pain:  Vitals:   08/30/20 1149  PainSc: 0-No pain                 Thurman Sarver

## 2020-08-31 ENCOUNTER — Encounter (HOSPITAL_COMMUNITY): Payer: Self-pay | Admitting: Radiology

## 2020-09-01 ENCOUNTER — Other Ambulatory Visit: Payer: Self-pay | Admitting: Neurology

## 2020-09-01 MED ORDER — ZONISAMIDE 50 MG PO CAPS: 50.0000 mg | ORAL_CAPSULE | Freq: Every day | ORAL | 5 refills | Status: DC

## 2020-09-19 ENCOUNTER — Encounter (INDEPENDENT_AMBULATORY_CARE_PROVIDER_SITE_OTHER): Payer: Medicare Other | Admitting: Ophthalmology

## 2020-09-20 DIAGNOSIS — M720 Palmar fascial fibromatosis [Dupuytren]: Secondary | ICD-10-CM | POA: Diagnosis not present

## 2020-09-20 DIAGNOSIS — R42 Dizziness and giddiness: Secondary | ICD-10-CM | POA: Diagnosis not present

## 2020-09-20 DIAGNOSIS — G250 Essential tremor: Secondary | ICD-10-CM | POA: Diagnosis not present

## 2020-09-27 DIAGNOSIS — R42 Dizziness and giddiness: Secondary | ICD-10-CM | POA: Diagnosis not present

## 2020-09-27 DIAGNOSIS — H903 Sensorineural hearing loss, bilateral: Secondary | ICD-10-CM | POA: Diagnosis not present

## 2020-09-28 DIAGNOSIS — R600 Localized edema: Secondary | ICD-10-CM | POA: Diagnosis not present

## 2020-09-28 DIAGNOSIS — I8312 Varicose veins of left lower extremity with inflammation: Secondary | ICD-10-CM | POA: Diagnosis not present

## 2020-09-28 DIAGNOSIS — I8311 Varicose veins of right lower extremity with inflammation: Secondary | ICD-10-CM | POA: Diagnosis not present

## 2020-09-28 DIAGNOSIS — I87323 Chronic venous hypertension (idiopathic) with inflammation of bilateral lower extremity: Secondary | ICD-10-CM | POA: Diagnosis not present

## 2020-10-10 ENCOUNTER — Ambulatory Visit (INDEPENDENT_AMBULATORY_CARE_PROVIDER_SITE_OTHER): Payer: Medicare Other | Admitting: Ophthalmology

## 2020-10-10 ENCOUNTER — Other Ambulatory Visit: Payer: Self-pay

## 2020-10-10 ENCOUNTER — Encounter (INDEPENDENT_AMBULATORY_CARE_PROVIDER_SITE_OTHER): Payer: Self-pay | Admitting: Ophthalmology

## 2020-10-10 DIAGNOSIS — H2513 Age-related nuclear cataract, bilateral: Secondary | ICD-10-CM

## 2020-10-10 DIAGNOSIS — H34812 Central retinal vein occlusion, left eye, with macular edema: Secondary | ICD-10-CM | POA: Diagnosis not present

## 2020-10-10 MED ORDER — BEVACIZUMAB 2.5 MG/0.1ML IZ SOSY
2.5000 mg | PREFILLED_SYRINGE | INTRAVITREAL | Status: AC | PRN
  Administered 2020-10-10: 2.5 mg via INTRAVITREAL

## 2020-10-10 NOTE — Assessment & Plan Note (Signed)
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  CME is well controlled although still trying to be active with apparent blockage at the optic nerve.  We will remain vigilant by treating today with intravitreal Avastin currently at 9-week interval and and maintain 8 to 9-week follow-up

## 2020-10-10 NOTE — Progress Notes (Signed)
10/10/2020     CHIEF COMPLAINT Patient presents for Retina Follow Up (6 week fu OS and Avastin OS/Pt states VA OU stable since last visit. Pt denies FOL, floaters, or ocular pain OU. /)   HISTORY OF PRESENT ILLNESS: Todd Chan is a 75 y.o. male who presents to the clinic today for:   HPI     Retina Follow Up           Diagnosis: CRVO/BRVO   Laterality: left eye   Onset: 6 weeks ago   Severity: mild   Duration: 6 weeks   Course: stable   Comments: 6 week fu OS and Avastin OS Pt states VA OU stable since last visit. Pt denies FOL, floaters, or ocular pain OU.         Last edited by Kendra Opitz, COA on 10/10/2020  2:25 PM.      Referring physician: Keene Breath., MD [place] [address],  [place] [address]  HISTORICAL INFORMATION:   Selected notes from the [facility]: No current outpatient medications on file. (Ophthalmic Drugs)   No current facility-administered medications for this visit. (Ophthalmic Drugs)   Current Outpatient Medications (Other)  Medication Sig   Cholecalciferol (VITAMIN D3) 50 MCG (2000 UT) capsule Take 2,000 Units by mouth daily.   Coenzyme Q10 (COQ10 PO) Take 100 mg by mouth daily.   metoprolol tartrate (LOPRESSOR) 25 MG tablet TAKE 1 TABLET TWICE A DAY (Patient taking differently: Take 25 mg by mouth 2 (two) times daily.)   Multiple Vitamin (MULTIVITAMIN) capsule Take 1 capsule by mouth daily.   Omega-3 Fatty Acids (FISH OIL PO) Take 1,290 mg by mouth daily.   pravastatin (PRAVACHOL) 20 MG tablet TAKE 1 TABLET EVERY EVENING (Patient taking differently: Take 20 mg by mouth daily. TAKE 1 TABLET EVERY EVENING)   telmisartan-hydrochlorothiazide (MICARDIS HCT) 40-12.5 MG tablet TAKE 1 TABLET DAILY (Patient taking differently: Take 1 tablet by mouth daily.)   zonisamide (ZONEGRAN) 50 MG capsule Take 1 capsule (50 mg total) by mouth daily.   No current facility-administered  medications for this visit. (Other)      REVIEW OF SYSTEMS:    ALLERGIES No Known Allergies  PAST MEDICAL HISTORY Past Medical History:  Diagnosis Date   Arthritis    GERD (gastroesophageal reflux disease)    Hypertension    Osteoarthritis of knee    Painful urination    Peripheral vascular disease ([place])    bilateral edema    Past Surgical History:  Procedure Laterality Date   CARDIAC CATHETERIZATION  04/2017   ESOPHAGOGASTRODUODENOSCOPY ENDOSCOPY     8 days ago   KNEE ARTHROSCOPY Left 6-7 years ago   LEFT HEART CATH AND CORONARY ANGIOGRAPHY N/A 04/17/2017   Procedure: LEFT HEART CATH AND CORONARY ANGIOGRAPHY;  Surgeon: Troy Sine, MD;  Location: [facility];  Service: Cardiovascular;  Laterality: N/A;   RADIOLOGY WITH ANESTHESIA N/A 08/30/2020   Procedure: MRI WITH ANESTHESIA BRAIN WITH AND WITHOUT AND MRI CERVICAL SPINE WITH AND WITHOUT;  Surgeon: Radiologist, Medication, MD;  Location: [facility];  Service: Radiology;  Laterality: N/A;   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   TONSILLECTOMY     TOTAL KNEE ARTHROPLASTY Left 10/08/2016   Procedure: LEFT TOTAL KNEE ARTHROPLASTY;  Surgeon: Gaynelle Arabian, MD;  Location: [facility];  Service: Orthopedics;  Laterality: Left;   TOTAL KNEE ARTHROPLASTY Right 06/17/2017   Procedure: RIGHT TOTAL  KNEE ARTHROPLASTY;  Surgeon: Gaynelle Arabian, MD;  Location: [facility];  Service: Orthopedics;  Laterality: Right;    FAMILY HISTORY Family History  Problem Relation Age of Onset   Breast cancer Mother 63   Prostate cancer Father    Breast cancer Sister 71   Neuropathy Neg Hx     SOCIAL HISTORY Social History   Tobacco Use   Smoking status: Former    Packs/day: 1.00    Years: 20.00    Pack years: 20.00    Types: Cigarettes    Quit date: 10/1987    Years since quitting: 33.0   Smokeless tobacco: Never   Tobacco comments:    quit 30 yeara ago   Vaping Use   Vaping Use: Never used  Substance Use Topics   Alcohol use: Yes     Comment: 3 drinks per day- wine and scotch   Drug use: No         OPHTHALMIC EXAM:  Base Eye Exam     Visual Acuity (ETDRS)       Right Left   Dist [place] 20/50 20/25 -2   Dist ph [place] 20/25 +1          Tonometry (Tonopen, 2:30 PM)       Right Left   Pressure 11 14         Pupils       Pupils Dark Light Shape React APD   Right PERRL 4 3 Round Brisk None   Left PERRL 4 3 Round Brisk None         Visual Fields (Counting fingers)       Left Right    Full Full         Extraocular Movement       Right Left    Full Full         Neuro/Psych     Oriented x3: Yes   Mood/Affect: Normal         Dilation     Left eye: 1.0% Mydriacyl, 2.5% Phenylephrine @ 2:30 PM           Slit Lamp and Fundus Exam     External Exam       Right Left   External Normal Normal         Slit Lamp Exam       Right Left   Lids/Lashes Normal Normal   Conjunctiva/Sclera White and quiet White and quiet   Cornea Clear Clear   Anterior Chamber Deep and quiet Deep and quiet   Iris Round and reactive Round and reactive   Lens 2+ Nuclear sclerosis  Central color change and sclerosis yet outer clear cortex 2+ Nuclear sclerosis central color change and sclerosis yet outer clear cortex   Anterior Vitreous Normal Normal         Fundus Exam       Right Left   Posterior Vitreous  Normal   Disc  Hemorrhage at the nerve secondary to superior hemi-CRV O   C/D Ratio  0.1   Macula  Intraretinal hemorrhage superior hemimacula, but no CME   Vessels  Superior Hemi central retinal vein occlusion   Periphery  Normal            IMAGING AND PROCEDURES  Imaging and Procedures for 10/10/20  OCT, Retina - OU - Both Eyes       Right Eye Quality was good. Scan locations included subfoveal. Central Foveal Thickness: 304. Progression has been stable. Findings  include normal foveal contour.   Left Eye Quality was good. Scan locations included subfoveal. Central Foveal  Thickness: 314. Progression has improved. Findings include abnormal foveal contour.   Notes Much less intraretinal edema in the superior portion of macula yet still present on OCT without intraretinal fluid.  Related to superior Hemi central retinal vein occlusion left eye     Intravitreal Injection, Pharmacologic Agent - OS - Left Eye       Time Out 10/10/2020. 3:20 PM. Confirmed correct patient, procedure, site, and patient consented.   Anesthesia Topical anesthesia was used. Anesthetic medications included Akten 3.5%.   Procedure Preparation included Tobramycin 0.3%, Ofloxacin , 10% betadine to eyelids, 5% betadine to ocular surface. A 30 gauge needle was used.   Injection: 2.5 mg bevacizumab 2.5 MG/0.1ML   Route: Intravitreal, Site: Left Eye   NDC: [phone], Lot: [id number]   Post-op Post injection exam found visual acuity of at least counting fingers. The patient tolerated the procedure well. There were no complications. The patient received written and verbal post procedure care education. Post injection medications were not given.              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  CME is well controlled although still trying to be active with apparent blockage at the optic nerve.  We will remain vigilant by treating today with intravitreal Avastin currently at 9-week interval and and maintain 8 to 9-week follow-up  Cataract, nuclear sclerotic, both eyes Okay to proceed with cataract extraction with intraocular lens placement left eye at the discretion of Dr. Clydene Laming and the choice  Of the patient     ICD-10-CM   1. 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 Cataract, nuclear sclerotic, both eyes  H25.13       1.  OS with Hemi central retinal vein occlusion secondary early macular edema.  Ongoing clinical findings of perivascular hemorrhage at 9 weeks post intravitreal Avastin.  We will repeat injection Avastin today and examination OS next in 8 weeks.  2.  Patient is cleared from an ocular standpoint to proceed with cataract extraction with intraocular lens placement anytime of the choice of Dr. Barbie Haggis and of the patient.  Been my experience that cataract extraction with intraocular displacement and I like the left can in fact assist in clearance of this vein occlusion condition.  3.  Ophthalmic Meds Ordered this visit:  Meds ordered this encounter  Medications   bevacizumab (AVASTIN) SOSY 2.5 mg       Return in about 8 weeks (around 12/05/2020) for dilate, OS, AVASTIN OCT.  There are no Patient Instructions on file for this visit.   Explained the diagnoses, plan, and follow up with the patient and they expressed understanding.  Patient expressed understanding of the importance of proper follow up care.   Clent Demark Shelda Truby M.D. Diseases & Surgery of the Retina and Vitreous Retina & Diabetic [place] 10/10/20     Abbreviations: M myopia (nearsighted); A astigmatism; H hyperopia (farsighted); P presbyopia; Mrx spectacle prescription;  CTL contact lenses; OD right eye; OS left eye; OU both eyes  XT exotropia; ET esotropia; PEK punctate epithelial keratitis; PEE punctate epithelial erosions; DES dry eye syndrome; MGD meibomian gland dysfunction; ATs artificial tears; PFAT's preservative free artificial tears; [place] nuclear sclerotic cataract; PSC posterior subcapsular cataract; ERM epi-retinal membrane; PVD posterior vitreous detachment; RD retinal detachment; DM diabetes mellitus; DR diabetic retinopathy; NPDR non-proliferative diabetic retinopathy; PDR  proliferative diabetic retinopathy; CSME clinically significant macular edema; DME diabetic macular edema; dbh dot blot hemorrhages; CWS cotton wool spot; POAG primary open angle glaucoma; C/D cup-to-disc ratio; HVF humphrey visual field; GVF goldmann visual field; OCT optical coherence tomography; IOP intraocular pressure; BRVO Branch retinal vein occlusion; CRVO central retinal vein occlusion; CRAO central retinal artery occlusion; BRAO branch retinal artery occlusion; RT retinal tear; SB scleral buckle; PPV pars plana vitrectomy; VH Vitreous hemorrhage; PRP panretinal laser photocoagulation; IVK intravitreal kenalog; VMT vitreomacular traction; MH Macular hole;  NVD neovascularization of the disc; NVE neovascularization elsewhere; AREDS age related eye disease study; ARMD age related macular degeneration; POAG primary open angle glaucoma; EBMD epithelial/anterior basement membrane dystrophy; ACIOL anterior chamber intraocular lens; IOL intraocular lens; PCIOL posterior chamber intraocular lens; Phaco/IOL phacoemulsification with intraocular lens placement; [place] photorefractive keratectomy; LASIK laser assisted in situ keratomileusis; HTN hypertension; DM diabetes mellitus; COPD chronic obstructive pulmonary disease

## 2020-10-10 NOTE — Assessment & Plan Note (Signed)
Okay to proceed with cataract extraction with intraocular lens placement left eye at the discretion of Dr. Clydene Laming and the choice  Of the patient

## 2020-10-18 DIAGNOSIS — I8311 Varicose veins of right lower extremity with inflammation: Secondary | ICD-10-CM | POA: Diagnosis not present

## 2020-10-21 DIAGNOSIS — I8311 Varicose veins of right lower extremity with inflammation: Secondary | ICD-10-CM | POA: Diagnosis not present

## 2020-10-28 DIAGNOSIS — I8311 Varicose veins of right lower extremity with inflammation: Secondary | ICD-10-CM | POA: Diagnosis not present

## 2020-10-31 DIAGNOSIS — M79641 Pain in right hand: Secondary | ICD-10-CM | POA: Diagnosis not present

## 2020-10-31 DIAGNOSIS — M79642 Pain in left hand: Secondary | ICD-10-CM | POA: Diagnosis not present

## 2020-10-31 DIAGNOSIS — M720 Palmar fascial fibromatosis [Dupuytren]: Secondary | ICD-10-CM | POA: Diagnosis not present

## 2020-11-08 DIAGNOSIS — M9902 Segmental and somatic dysfunction of thoracic region: Secondary | ICD-10-CM | POA: Diagnosis not present

## 2020-11-08 DIAGNOSIS — M5033 Other cervical disc degeneration, cervicothoracic region: Secondary | ICD-10-CM | POA: Diagnosis not present

## 2020-11-08 DIAGNOSIS — M5134 Other intervertebral disc degeneration, thoracic region: Secondary | ICD-10-CM | POA: Diagnosis not present

## 2020-11-08 DIAGNOSIS — M9901 Segmental and somatic dysfunction of cervical region: Secondary | ICD-10-CM | POA: Diagnosis not present

## 2020-11-11 DIAGNOSIS — L03019 Cellulitis of unspecified finger: Secondary | ICD-10-CM | POA: Diagnosis not present

## 2020-11-14 DIAGNOSIS — M9901 Segmental and somatic dysfunction of cervical region: Secondary | ICD-10-CM | POA: Diagnosis not present

## 2020-11-14 DIAGNOSIS — M5033 Other cervical disc degeneration, cervicothoracic region: Secondary | ICD-10-CM | POA: Diagnosis not present

## 2020-11-14 DIAGNOSIS — M9902 Segmental and somatic dysfunction of thoracic region: Secondary | ICD-10-CM | POA: Diagnosis not present

## 2020-11-14 DIAGNOSIS — M5134 Other intervertebral disc degeneration, thoracic region: Secondary | ICD-10-CM | POA: Diagnosis not present

## 2020-11-15 DIAGNOSIS — I8312 Varicose veins of left lower extremity with inflammation: Secondary | ICD-10-CM | POA: Diagnosis not present

## 2020-11-22 ENCOUNTER — Other Ambulatory Visit: Payer: Self-pay | Admitting: *Deleted

## 2020-11-22 DIAGNOSIS — M47812 Spondylosis without myelopathy or radiculopathy, cervical region: Secondary | ICD-10-CM

## 2020-11-22 DIAGNOSIS — I8311 Varicose veins of right lower extremity with inflammation: Secondary | ICD-10-CM | POA: Diagnosis not present

## 2020-11-22 DIAGNOSIS — R519 Headache, unspecified: Secondary | ICD-10-CM

## 2020-11-22 DIAGNOSIS — M542 Cervicalgia: Secondary | ICD-10-CM

## 2020-11-22 DIAGNOSIS — G4489 Other headache syndrome: Secondary | ICD-10-CM

## 2020-11-22 DIAGNOSIS — R200 Anesthesia of skin: Secondary | ICD-10-CM

## 2020-11-28 NOTE — Telephone Encounter (Signed)
Referral sent to [facility]. Phone: [phone].

## 2020-11-29 DIAGNOSIS — I8312 Varicose veins of left lower extremity with inflammation: Secondary | ICD-10-CM | POA: Diagnosis not present

## 2020-12-01 HISTORY — PX: HAND CONTRACTURE RELEASE: SHX1724

## 2020-12-06 ENCOUNTER — Encounter (INDEPENDENT_AMBULATORY_CARE_PROVIDER_SITE_OTHER): Payer: Medicare Other | Admitting: Ophthalmology

## 2020-12-06 DIAGNOSIS — M542 Cervicalgia: Secondary | ICD-10-CM | POA: Diagnosis not present

## 2020-12-06 DIAGNOSIS — R519 Headache, unspecified: Secondary | ICD-10-CM | POA: Diagnosis not present

## 2020-12-06 DIAGNOSIS — M47812 Spondylosis without myelopathy or radiculopathy, cervical region: Secondary | ICD-10-CM | POA: Diagnosis not present

## 2020-12-08 ENCOUNTER — Encounter (INDEPENDENT_AMBULATORY_CARE_PROVIDER_SITE_OTHER): Payer: Self-pay | Admitting: Ophthalmology

## 2020-12-08 ENCOUNTER — Other Ambulatory Visit: Payer: Self-pay

## 2020-12-08 ENCOUNTER — Ambulatory Visit (INDEPENDENT_AMBULATORY_CARE_PROVIDER_SITE_OTHER): Payer: Medicare Other | Admitting: Ophthalmology

## 2020-12-08 DIAGNOSIS — H2513 Age-related nuclear cataract, bilateral: Secondary | ICD-10-CM | POA: Diagnosis not present

## 2020-12-08 DIAGNOSIS — M542 Cervicalgia: Secondary | ICD-10-CM | POA: Diagnosis not present

## 2020-12-08 DIAGNOSIS — H34812 Central retinal vein occlusion, left eye, with macular edema: Secondary | ICD-10-CM | POA: Diagnosis not present

## 2020-12-08 DIAGNOSIS — M47812 Spondylosis without myelopathy or radiculopathy, cervical region: Secondary | ICD-10-CM | POA: Diagnosis not present

## 2020-12-08 DIAGNOSIS — I2510 Atherosclerotic heart disease of native coronary artery without angina pectoris: Secondary | ICD-10-CM

## 2020-12-08 DIAGNOSIS — R519 Headache, unspecified: Secondary | ICD-10-CM | POA: Diagnosis not present

## 2020-12-08 MED ORDER — BEVACIZUMAB 2.5 MG/0.1ML IZ SOSY
2.5000 mg | PREFILLED_SYRINGE | INTRAVITREAL | Status: AC | PRN
  Administered 2020-12-08: 2.5 mg via INTRAVITREAL

## 2020-12-08 NOTE — Progress Notes (Signed)
12/08/2020     CHIEF COMPLAINT Patient presents for  Chief Complaint  Patient presents with   Retina Follow Up      HISTORY OF PRESENT ILLNESS: Todd Chan is a 75 y.o. male who presents to the clinic today for:   HPI     Retina Follow Up   Patient presents with  CRVO/BRVO.  In left eye.  This started 8 weeks ago.  Severity is mild.  Duration of 8 weeks.        Comments   8 week f/u OS with OCT and possible Avastin  Pt denies any visual changes since previous visit. Pt denies any new flashes or floaters. Pt denies any eye pain.   Eye Meds: None  Patient has cataract evaluation arranged for early October, Dr. Audry Pili      Last edited by Hurman Horn, MD on 12/08/2020 10:44 AM.      Referring physician: Lyman Bishop, DO [place],  [place] [id number]  HISTORICAL INFORMATION:   Selected notes from the [facility]: No current outpatient medications on file. (Ophthalmic Drugs)   No current facility-administered medications for this visit. (Ophthalmic Drugs)   Current Outpatient Medications (Other)  Medication Sig   Cholecalciferol (VITAMIN D3) 50 MCG (2000 UT) capsule Take 2,000 Units by mouth daily.   Coenzyme Q10 (COQ10 PO) Take 100 mg by mouth daily.   metoprolol tartrate (LOPRESSOR) 25 MG tablet TAKE 1 TABLET TWICE A DAY (Patient taking differently: Take 25 mg by mouth 2 (two) times daily.)   Multiple Vitamin (MULTIVITAMIN) capsule Take 1 capsule by mouth daily.   Omega-3 Fatty Acids (FISH OIL PO) Take 1,290 mg by mouth daily.   pravastatin (PRAVACHOL) 20 MG tablet TAKE 1 TABLET EVERY EVENING (Patient taking differently: Take 20 mg by mouth daily. TAKE 1 TABLET EVERY EVENING)   telmisartan-hydrochlorothiazide (MICARDIS HCT) 40-12.5 MG tablet TAKE 1 TABLET DAILY (Patient taking differently: Take 1 tablet by mouth daily.)   zonisamide (ZONEGRAN) 50 MG capsule Take 1 capsule (50 mg total) by mouth  daily.   No current facility-administered medications for this visit. (Other)      REVIEW OF SYSTEMS:    ALLERGIES No Known Allergies  PAST MEDICAL HISTORY Past Medical History:  Diagnosis Date   Arthritis    GERD (gastroesophageal reflux disease)    Hypertension    Osteoarthritis of knee    Painful urination    Peripheral vascular disease ([place])    bilateral edema    Past Surgical History:  Procedure Laterality Date   CARDIAC CATHETERIZATION  04/2017   ESOPHAGOGASTRODUODENOSCOPY ENDOSCOPY     8 days ago   KNEE ARTHROSCOPY Left 6-7 years ago   LEFT HEART CATH AND CORONARY ANGIOGRAPHY N/A 04/17/2017   Procedure: LEFT HEART CATH AND CORONARY ANGIOGRAPHY;  Surgeon: Troy Sine, MD;  Location: [facility];  Service: Cardiovascular;  Laterality: N/A;   RADIOLOGY WITH ANESTHESIA N/A 08/30/2020   Procedure: MRI WITH ANESTHESIA BRAIN WITH AND WITHOUT AND MRI CERVICAL SPINE WITH AND WITHOUT;  Surgeon: Radiologist, Medication, MD;  Location: [facility];  Service: Radiology;  Laterality: N/A;   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   TONSILLECTOMY     TOTAL KNEE ARTHROPLASTY Left 10/08/2016   Procedure: LEFT TOTAL KNEE ARTHROPLASTY;  Surgeon: Gaynelle Arabian, MD;  Location: [facility];  Service: Orthopedics;  Laterality: Left;   TOTAL KNEE ARTHROPLASTY Right  06/17/2017   Procedure: RIGHT TOTAL KNEE ARTHROPLASTY;  Surgeon: Gaynelle Arabian, MD;  Location: [facility];  Service: Orthopedics;  Laterality: Right;    FAMILY HISTORY Family History  Problem Relation Age of Onset   Breast cancer Mother 67   Prostate cancer Father    Breast cancer Sister 18   Neuropathy Neg Hx     SOCIAL HISTORY Social History   Tobacco Use   Smoking status: Former    Packs/day: 1.00    Years: 20.00    Pack years: 20.00    Types: Cigarettes    Quit date: 10/1987    Years since quitting: 33.2   Smokeless tobacco: Never   Tobacco comments:    quit 30 yeara ago   Vaping Use   Vaping Use: Never used   Substance Use Topics   Alcohol use: Yes    Comment: 3 drinks per day- wine and scotch   Drug use: No         OPHTHALMIC EXAM:  Base Eye Exam     Visual Acuity (ETDRS)       Right Left   Dist cc 20/40 -2 20/25   Dist ph cc NI     Correction: Contacts         Tonometry (Tonopen, 10:05 AM)       Right Left   Pressure 13 15         Pupils       Pupils Dark Light Shape React APD   Right PERRL 4 3 Round Brisk None   Left PERRL 4 3 Round Brisk None         Visual Fields (Counting fingers)       Left Right    Full Full         Extraocular Movement       Right Left    Full, Ortho Full, Ortho         Neuro/Psych     Oriented x3: Yes   Mood/Affect: Normal         Dilation     Left eye: 1.0% Mydriacyl, 2.5% Phenylephrine @ 10:05 AM           Slit Lamp and Fundus Exam     External Exam       Right Left   External Normal Normal         Slit Lamp Exam       Right Left   Lids/Lashes Normal Normal   Conjunctiva/Sclera White and quiet White and quiet   Cornea Clear Clear   Anterior Chamber Deep and quiet Deep and quiet   Iris Round and reactive Round and reactive   Lens 2+ Nuclear sclerosis  Central color change and sclerosis yet outer clear cortex 2+ Nuclear sclerosis central color change and sclerosis yet outer clear cortex   Anterior Vitreous Normal Normal         Fundus Exam       Right Left   Posterior Vitreous  Normal   Disc  Possible early collaterals   C/D Ratio  0.1   Macula  Less clinical intraretinal hemorrhage superiorly, but no CME   Vessels  Superior Hemi central retinal vein occlusion   Periphery  Normal            IMAGING AND PROCEDURES  Imaging and Procedures for 12/08/20  OCT, Retina - OU - Both Eyes       Right Eye Quality was good. Scan locations included subfoveal. Central Foveal  Thickness: 304. Progression has been stable. Findings include normal foveal contour.   Left Eye Quality was  good. Scan locations included subfoveal. Central Foveal Thickness: 314. Progression has improved. Findings include abnormal foveal contour.   Notes OS much less intraretinal hemorrhage as Hemi CRV O continues to improve now At 8-week interval     Intravitreal Injection, Pharmacologic Agent - OS - Left Eye       Time Out 12/08/2020. 10:46 AM. Confirmed correct patient, procedure, site, and patient consented.   Anesthesia Topical anesthesia was used. Anesthetic medications included Akten 3.5%.   Procedure Preparation included Tobramycin 0.3%, Ofloxacin , 10% betadine to eyelids, 5% betadine to ocular surface. A 30 gauge needle was used.   Injection: 2.5 mg bevacizumab 2.5 MG/0.1ML   Route: Intravitreal, Site: Left Eye   NDC: [phone], Lot: [id number]   Post-op Post injection exam found visual acuity of at least counting fingers. The patient tolerated the procedure well. There were no complications. The patient received written and verbal post procedure care education. Post injection medications were not given.              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 Audry Pili okay to proceed at any time with surgical intervention     ICD-10-CM   1. 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 Cataract, nuclear sclerotic, both eyes  H25.13       1.  OS continued appearance of collateralization developing for hemispheric CRV O superiorly.  Acuity preserved less CME currently at 8-week interval repeat injection today and  follow-up next in 9 weeks  Ultimate goal is to proceed to 3 months and if no recurrence of CME and intraretinal hemorrhage has abated with collateralization of the nerve, consideration could be given to observation  2.  3.  Ophthalmic Meds Ordered this visit:  Meds ordered this encounter  Medications   bevacizumab (AVASTIN) SOSY 2.5 mg       Return in about 9 weeks (around 02/09/2021) for dilate, OS, AVASTIN OCT.  There are no Patient Instructions on file for this visit.   Explained the diagnoses, plan, and follow up with the patient and they expressed understanding.  Patient expressed understanding of the importance of proper follow up care.   Clent Demark Jhase Creppel M.D. Diseases & Surgery of the Retina and Vitreous Retina & Diabetic [place] 12/08/20     Abbreviations: M myopia (nearsighted); A astigmatism; H hyperopia (farsighted); P presbyopia; Mrx spectacle prescription;  CTL contact lenses; OD right eye; OS left eye; OU both eyes  XT exotropia; ET esotropia; PEK punctate epithelial keratitis; PEE punctate epithelial erosions; DES dry eye syndrome; MGD meibomian gland dysfunction; ATs artificial tears; PFAT's preservative free artificial tears; [place] nuclear sclerotic cataract; PSC posterior subcapsular cataract; ERM epi-retinal membrane; PVD posterior vitreous detachment; RD retinal detachment; DM diabetes mellitus; DR diabetic retinopathy; NPDR non-proliferative diabetic retinopathy; PDR proliferative diabetic retinopathy; CSME clinically significant macular edema; DME diabetic macular edema; dbh dot blot hemorrhages; CWS cotton wool spot; POAG primary open angle glaucoma; C/D cup-to-disc  ratio; HVF humphrey visual field; GVF goldmann visual field; OCT optical coherence tomography; IOP intraocular pressure; BRVO Branch retinal vein occlusion; CRVO central retinal vein occlusion; CRAO central retinal artery occlusion; BRAO branch retinal artery occlusion; RT retinal tear; SB scleral  buckle; PPV pars plana vitrectomy; VH Vitreous hemorrhage; PRP panretinal laser photocoagulation; IVK intravitreal kenalog; VMT vitreomacular traction; MH Macular hole;  NVD neovascularization of the disc; NVE neovascularization elsewhere; AREDS age related eye disease study; ARMD age related macular degeneration; POAG primary open angle glaucoma; EBMD epithelial/anterior basement membrane dystrophy; ACIOL anterior chamber intraocular lens; IOL intraocular lens; PCIOL posterior chamber intraocular lens; Phaco/IOL phacoemulsification with intraocular lens placement; [place] photorefractive keratectomy; LASIK laser assisted in situ keratomileusis; HTN hypertension; DM diabetes mellitus; COPD chronic obstructive pulmonary disease

## 2020-12-08 NOTE — Assessment & Plan Note (Signed)
Patient has cataract evaluation scheduled early October Dr. Audry Pili okay to proceed at any time with surgical intervention

## 2020-12-08 NOTE — Assessment & Plan Note (Signed)
Vastly improved overall we will continue to treat currently at 8-week interval and extend interval next to 9 weeks

## 2020-12-09 DIAGNOSIS — I8311 Varicose veins of right lower extremity with inflammation: Secondary | ICD-10-CM | POA: Diagnosis not present

## 2020-12-13 DIAGNOSIS — E785 Hyperlipidemia, unspecified: Secondary | ICD-10-CM | POA: Diagnosis not present

## 2020-12-13 DIAGNOSIS — R739 Hyperglycemia, unspecified: Secondary | ICD-10-CM | POA: Diagnosis not present

## 2020-12-13 DIAGNOSIS — M47812 Spondylosis without myelopathy or radiculopathy, cervical region: Secondary | ICD-10-CM | POA: Diagnosis not present

## 2020-12-13 DIAGNOSIS — Z0000 Encounter for general adult medical examination without abnormal findings: Secondary | ICD-10-CM | POA: Diagnosis not present

## 2020-12-13 DIAGNOSIS — R519 Headache, unspecified: Secondary | ICD-10-CM | POA: Diagnosis not present

## 2020-12-13 DIAGNOSIS — Z125 Encounter for screening for malignant neoplasm of prostate: Secondary | ICD-10-CM | POA: Diagnosis not present

## 2020-12-13 DIAGNOSIS — M79605 Pain in left leg: Secondary | ICD-10-CM | POA: Diagnosis not present

## 2020-12-13 DIAGNOSIS — M542 Cervicalgia: Secondary | ICD-10-CM | POA: Diagnosis not present

## 2020-12-13 DIAGNOSIS — I712 Thoracic aortic aneurysm, without rupture: Secondary | ICD-10-CM | POA: Diagnosis not present

## 2020-12-13 DIAGNOSIS — I10 Essential (primary) hypertension: Secondary | ICD-10-CM | POA: Diagnosis not present

## 2020-12-15 DIAGNOSIS — R519 Headache, unspecified: Secondary | ICD-10-CM | POA: Diagnosis not present

## 2020-12-15 DIAGNOSIS — M47812 Spondylosis without myelopathy or radiculopathy, cervical region: Secondary | ICD-10-CM | POA: Diagnosis not present

## 2020-12-15 DIAGNOSIS — M542 Cervicalgia: Secondary | ICD-10-CM | POA: Diagnosis not present

## 2020-12-20 DIAGNOSIS — R519 Headache, unspecified: Secondary | ICD-10-CM | POA: Diagnosis not present

## 2020-12-20 DIAGNOSIS — M542 Cervicalgia: Secondary | ICD-10-CM | POA: Diagnosis not present

## 2020-12-20 DIAGNOSIS — M47812 Spondylosis without myelopathy or radiculopathy, cervical region: Secondary | ICD-10-CM | POA: Diagnosis not present

## 2020-12-20 DIAGNOSIS — Z23 Encounter for immunization: Secondary | ICD-10-CM | POA: Diagnosis not present

## 2020-12-20 DIAGNOSIS — Z125 Encounter for screening for malignant neoplasm of prostate: Secondary | ICD-10-CM | POA: Diagnosis not present

## 2020-12-23 DIAGNOSIS — M47812 Spondylosis without myelopathy or radiculopathy, cervical region: Secondary | ICD-10-CM | POA: Diagnosis not present

## 2020-12-23 DIAGNOSIS — R519 Headache, unspecified: Secondary | ICD-10-CM | POA: Diagnosis not present

## 2020-12-23 DIAGNOSIS — M542 Cervicalgia: Secondary | ICD-10-CM | POA: Diagnosis not present

## 2020-12-26 DIAGNOSIS — M720 Palmar fascial fibromatosis [Dupuytren]: Secondary | ICD-10-CM | POA: Diagnosis not present

## 2020-12-26 DIAGNOSIS — Z4789 Encounter for other orthopedic aftercare: Secondary | ICD-10-CM | POA: Diagnosis not present

## 2020-12-28 DIAGNOSIS — M720 Palmar fascial fibromatosis [Dupuytren]: Secondary | ICD-10-CM | POA: Diagnosis not present

## 2020-12-28 DIAGNOSIS — M25642 Stiffness of left hand, not elsewhere classified: Secondary | ICD-10-CM | POA: Diagnosis not present

## 2020-12-30 DIAGNOSIS — I8312 Varicose veins of left lower extremity with inflammation: Secondary | ICD-10-CM | POA: Diagnosis not present

## 2021-01-02 ENCOUNTER — Other Ambulatory Visit: Payer: Self-pay | Admitting: Cardiology

## 2021-01-03 NOTE — Telephone Encounter (Signed)
Telmisartan-Hydrochlorothiazide 10-12.5 mg # 90 tablets x 3 refills Metoprolol Tartrate 25 mg # 180 tablets x 3 refills sent to    [facility], [place] to Registered [name] Sites

## 2021-01-04 DIAGNOSIS — J069 Acute upper respiratory infection, unspecified: Secondary | ICD-10-CM | POA: Diagnosis not present

## 2021-01-04 DIAGNOSIS — R110 Nausea: Secondary | ICD-10-CM | POA: Diagnosis not present

## 2021-01-04 DIAGNOSIS — M25642 Stiffness of left hand, not elsewhere classified: Secondary | ICD-10-CM | POA: Diagnosis not present

## 2021-01-05 ENCOUNTER — Institutional Professional Consult (permissible substitution): Payer: Medicare Other | Admitting: Neurology

## 2021-01-06 ENCOUNTER — Ambulatory Visit: Payer: Medicare Other | Admitting: Podiatry

## 2021-01-09 DIAGNOSIS — M25642 Stiffness of left hand, not elsewhere classified: Secondary | ICD-10-CM | POA: Diagnosis not present

## 2021-01-10 DIAGNOSIS — I8311 Varicose veins of right lower extremity with inflammation: Secondary | ICD-10-CM | POA: Diagnosis not present

## 2021-01-12 ENCOUNTER — Encounter: Payer: Self-pay | Admitting: Podiatry

## 2021-01-12 ENCOUNTER — Ambulatory Visit (INDEPENDENT_AMBULATORY_CARE_PROVIDER_SITE_OTHER): Payer: Medicare Other | Admitting: Podiatry

## 2021-01-12 ENCOUNTER — Other Ambulatory Visit: Payer: Self-pay

## 2021-01-12 DIAGNOSIS — H2512 Age-related nuclear cataract, left eye: Secondary | ICD-10-CM | POA: Diagnosis not present

## 2021-01-12 DIAGNOSIS — H18413 Arcus senilis, bilateral: Secondary | ICD-10-CM | POA: Diagnosis not present

## 2021-01-12 DIAGNOSIS — H2513 Age-related nuclear cataract, bilateral: Secondary | ICD-10-CM | POA: Diagnosis not present

## 2021-01-12 DIAGNOSIS — H25043 Posterior subcapsular polar age-related cataract, bilateral: Secondary | ICD-10-CM | POA: Diagnosis not present

## 2021-01-12 DIAGNOSIS — G609 Hereditary and idiopathic neuropathy, unspecified: Secondary | ICD-10-CM | POA: Diagnosis not present

## 2021-01-12 DIAGNOSIS — H25013 Cortical age-related cataract, bilateral: Secondary | ICD-10-CM | POA: Diagnosis not present

## 2021-01-12 NOTE — Progress Notes (Signed)
  Subjective:  Patient ID: Todd Chan, male    DOB: 08-Jan-1946,   MRN: [id number]  Chief Complaint  Patient presents with   Numbness    Pt has had numbness in feet for years- just recently the numbness in toes has gotten worse- pt mentioned he has knees and back problems- having balance issues more so when shoes are on- further evaluation     75 y.o. male presents for concern for numbness in both of his feet that has recently worsened. Also concern for balance. Has been seen by Dr. Milinda Pointer and neurology in the past and worked up for neuropathy. Denies diabetes. Hoping to have nails trimmed as well.  . Denies any other pedal complaints. Denies n/v/f/c.   Past Medical History:  Diagnosis Date   Arthritis    GERD (gastroesophageal reflux disease)    Hypertension    Osteoarthritis of knee    Painful urination    Peripheral vascular disease ([place])    bilateral edema     Objective:  Physical Exam: Vascular: DP/PT pulses 2/4 bilateral. CFT <3 seconds. Normal hair growth on digits. No edema.  Skin. No lacerations or abrasions bilateral feet. Nails 1-5 are thickened discolored and elongated with subungual debris.  Musculoskeletal: MMT 5/5 bilateral lower extremities in DF, PF, Inversion and Eversion. Deceased ROM in DF of ankle joint. No pain to palpation.  Neurological: Sensation diminished to light touch. Virbratory and protective sensation diminished.   Assessment:   1. Idiopathic peripheral neuropathy      Plan:  Patient was evaluated and treated and all questions answered. Discussed neuropathy and etiology as well as treatment with patient.  -Discussed and educated patient on foot care, especially with  regards to the vascular, neurological and musculoskeletal systems.  -Discussed supportive shoes at all times and checking feet regularly.  -Follow-up with spine specialist to evaluate back pain.  -Nails 1-5 b/l debrided today as a courtesy.  -Patient to return in 3 months for  follow-up evaluation.    Lorenda Peck, DPM

## 2021-01-13 DIAGNOSIS — Z23 Encounter for immunization: Secondary | ICD-10-CM | POA: Diagnosis not present

## 2021-01-16 DIAGNOSIS — Z4789 Encounter for other orthopedic aftercare: Secondary | ICD-10-CM | POA: Diagnosis not present

## 2021-01-16 DIAGNOSIS — M720 Palmar fascial fibromatosis [Dupuytren]: Secondary | ICD-10-CM | POA: Diagnosis not present

## 2021-01-30 DIAGNOSIS — Q540 Hypospadias, balanic: Secondary | ICD-10-CM | POA: Diagnosis not present

## 2021-01-30 DIAGNOSIS — B356 Tinea cruris: Secondary | ICD-10-CM | POA: Diagnosis not present

## 2021-01-30 DIAGNOSIS — R972 Elevated prostate specific antigen [PSA]: Secondary | ICD-10-CM | POA: Diagnosis not present

## 2021-02-09 ENCOUNTER — Other Ambulatory Visit: Payer: Self-pay

## 2021-02-09 ENCOUNTER — Encounter (INDEPENDENT_AMBULATORY_CARE_PROVIDER_SITE_OTHER): Payer: Medicare Other | Admitting: Ophthalmology

## 2021-02-09 ENCOUNTER — Ambulatory Visit (INDEPENDENT_AMBULATORY_CARE_PROVIDER_SITE_OTHER): Payer: Medicare Other | Admitting: Ophthalmology

## 2021-02-09 ENCOUNTER — Encounter (INDEPENDENT_AMBULATORY_CARE_PROVIDER_SITE_OTHER): Payer: Self-pay | Admitting: Ophthalmology

## 2021-02-09 DIAGNOSIS — H2513 Age-related nuclear cataract, bilateral: Secondary | ICD-10-CM

## 2021-02-09 DIAGNOSIS — H34812 Central retinal vein occlusion, left eye, with macular edema: Secondary | ICD-10-CM | POA: Diagnosis not present

## 2021-02-09 MED ORDER — BEVACIZUMAB 2.5 MG/0.1ML IZ SOSY
2.5000 mg | PREFILLED_SYRINGE | INTRAVITREAL | Status: AC | PRN
  Administered 2021-02-09: 2.5 mg via INTRAVITREAL

## 2021-02-09 NOTE — Assessment & Plan Note (Signed)
Much improved macular edema OS, post Avastin currently at follow-up interval of 9 weeks, will repeat today and examination again in 11 to 12 weeks

## 2021-02-09 NOTE — Progress Notes (Signed)
02/09/2021     CHIEF COMPLAINT Patient presents for  Chief Complaint  Patient presents with   Retina Follow Up      HISTORY OF PRESENT ILLNESS: Todd Chan is a 75 y.o. male who presents to the clinic today for:   HPI     Retina Follow Up   Patient presents with  CRVO/BRVO.  In left eye.  This started 9 weeks ago.  Duration of 9 weeks.  Since onset it is stable.        Comments   9 week f/u OS with OCT and possible Avastin injection OS  Pt is scheduled for cataract surgery in Jan 2023, his general ophthalmologist has requested a timing for the Avastin injection OS to be schedule at 2 weeks surrounding the surgery date.   Technician failed to document the intraocular pressure but the contact At the time did  call out the intraocular pressure as it was measured via Tono-Pen is 15 mm as patient specifically ask technician what the result was and subsequently the patient shared that with [place], as 52      Last edited by Hurman Horn, MD on 02/09/2021 10:20 AM.      Referring physician: Keene Breath., MD [facility] [address],  [place] [address]  HISTORICAL INFORMATION:   Selected notes from the [facility]: No current outpatient medications on file. (Ophthalmic Drugs)   No current facility-administered medications for this visit. (Ophthalmic Drugs)   Current Outpatient Medications (Other)  Medication Sig   Cholecalciferol (VITAMIN D3) 50 MCG (2000 UT) capsule Take 2,000 Units by mouth daily.   Coenzyme Q10 (COQ10 PO) Take 100 mg by mouth daily.   metoprolol tartrate (LOPRESSOR) 25 MG tablet TAKE 1 TABLET TWICE A DAY   Multiple Vitamin (MULTIVITAMIN) capsule Take 1 capsule by mouth daily.   Omega-3 Fatty Acids (FISH OIL PO) Take 1,290 mg by mouth daily.   pravastatin (PRAVACHOL) 20 MG tablet TAKE 1 TABLET EVERY EVENING (Patient taking differently: Take 20 mg by mouth daily. TAKE 1 TABLET EVERY EVENING)    telmisartan-hydrochlorothiazide (MICARDIS HCT) 40-12.5 MG tablet TAKE 1 TABLET DAILY   zonisamide (ZONEGRAN) 50 MG capsule Take 1 capsule (50 mg total) by mouth daily.   No current facility-administered medications for this visit. 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   KNEE ARTHROSCOPY Left 6-7 years ago   LEFT HEART CATH AND CORONARY ANGIOGRAPHY N/A 04/17/2017   Procedure: LEFT HEART CATH AND CORONARY ANGIOGRAPHY;  Surgeon: Troy Sine, MD;  Location: [facility];  Service: Cardiovascular;  Laterality: N/A;   RADIOLOGY WITH ANESTHESIA N/A 08/30/2020   Procedure: MRI WITH ANESTHESIA BRAIN WITH AND WITHOUT AND MRI CERVICAL SPINE WITH AND WITHOUT;  Surgeon: Radiologist, Medication, MD;  Location: [facility];  Service: Radiology;  Laterality: N/A;   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   TONSILLECTOMY  TOTAL KNEE ARTHROPLASTY Left 10/08/2016   Procedure: LEFT TOTAL KNEE ARTHROPLASTY;  Surgeon: Gaynelle Arabian, MD;  Location: [facility];  Service: Orthopedics;  Laterality: Left;   TOTAL KNEE ARTHROPLASTY Right 06/17/2017   Procedure: RIGHT TOTAL KNEE ARTHROPLASTY;  Surgeon: Gaynelle Arabian, MD;  Location: [facility];  Service: Orthopedics;  Laterality: Right;    FAMILY HISTORY Family History  Problem Relation Age of Onset   Breast cancer Mother 102   Prostate cancer Father    Breast cancer Sister 62   Neuropathy Neg Hx     SOCIAL HISTORY Social History   Tobacco Use   Smoking status: Former    Packs/day: 1.00    Years: 20.00     Pack years: 20.00    Types: Cigarettes    Quit date: 10/1987    Years since quitting: 33.3   Smokeless tobacco: Never   Tobacco comments:    quit 30 yeara ago   Vaping Use   Vaping Use: Never used  Substance Use Topics   Alcohol use: Yes    Comment: 3 drinks per day- wine and scotch   Drug use: No         OPHTHALMIC EXAM:  Base Eye Exam     Visual Acuity (ETDRS)       Right Left   Dist cc 20/40 -2 20/40   Dist ph cc 20/25 -2 20/25 -2    Correction: Glasses         Tonometry (Tonopen, 10:18 AM)       Right Left   Pressure 15 15         Pupils       Pupils Dark Light Shape React APD   Right PERRL 4 3 Round Brisk None   Left PERRL 4 3 Round Brisk None         Visual Fields (Counting fingers)       Left Right    Full Full         Extraocular Movement       Right Left    Full, Ortho Full, Ortho         Neuro/Psych     Oriented x3: Yes   Mood/Affect: Normal         Dilation     Left eye: 1.0% Mydriacyl, 2.5% Phenylephrine @ 9:49 AM           Slit Lamp and Fundus Exam     External Exam       Right Left   External Normal Normal         Slit Lamp Exam       Right Left   Lids/Lashes Normal Normal   Conjunctiva/Sclera White and quiet White and quiet   Cornea Clear Clear   Anterior Chamber Deep and quiet Deep and quiet   Iris Round and reactive Round and reactive   Lens 2+ Nuclear sclerosis  Central color change and sclerosis yet outer clear cortex 2+ Nuclear sclerosis central color change and sclerosis yet outer clear cortex   Anterior Vitreous Normal Normal         Fundus Exam       Right Left   Posterior Vitreous  Normal   Disc  Possible early collaterals   C/D Ratio  0.1   Macula  Less clinical intraretinal hemorrhage superiorly, but no CME   Vessels  Superior Hemi central retinal vein occlusion   Periphery  Normal  IMAGING AND PROCEDURES  Imaging and Procedures for 02/09/21  OCT, Retina -  OU - Both Eyes       Right Eye Quality was good. Scan locations included subfoveal. Central Foveal Thickness: 300. Progression has been stable. Findings include normal foveal contour.   Left Eye Quality was good. Scan locations included subfoveal. Central Foveal Thickness: 300. Progression has improved. Findings include abnormal foveal contour.   Notes OS much less intraretinal hemorrhage as Hemi CRV O continues to improve now At 9-week interval, repeat injection today and follow-up next in 12 weeks     Intravitreal Injection, Pharmacologic Agent - OS - Left Eye       Time Out 02/09/2021. 10:18 AM. Confirmed correct patient, procedure, site, and patient consented.   Anesthesia Topical anesthesia was used. Anesthetic medications included Lidocaine 4%.   Procedure Preparation included Tobramycin 0.3%, Ofloxacin , 10% betadine to eyelids, 5% betadine to ocular surface. A 30 gauge needle was used.   Injection: 2.5 mg bevacizumab 2.5 MG/0.1ML   Route: Intravitreal, Site: Left Eye   NDC: [phone], Lot: [id number]   Post-op Post injection exam found visual acuity of at least counting fingers. The patient tolerated the procedure well. There were no complications. The patient received written and verbal post procedure care education. Post injection medications included ocuflox.              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 Cataract, nuclear sclerotic, both eyes  H25.13       1.  Repeat intravitreal Avastin OS today to continue resolution of CME from hemispheric CRV O.  2.  Bilateral cataract plan surgery in approximately 2 to 2.5 months respectively 3.  Follow-up dilate OU in 3 months possible Avastin OCT OS  Ophthalmic Meds Ordered this visit:  Meds ordered this encounter  Medications   bevacizumab (AVASTIN) SOSY 2.5 mg       Return in about 12 weeks (around 05/04/2021) for DILATE OU, AVASTIN OCT, OS.  There are no Patient Instructions on file for this visit.   Explained the diagnoses, plan, and follow up with the patient and they expressed understanding.  Patient expressed understanding of the importance of proper follow up care.   Clent Demark Sharese Manrique M.D. Diseases & Surgery of the Retina and Vitreous [facility] [place] 02/09/21     Abbreviations: M myopia (nearsighted); A astigmatism; H hyperopia (farsighted); P presbyopia; Mrx spectacle prescription;  CTL contact lenses; OD right eye; OS left eye; OU both eyes  XT exotropia; ET esotropia; PEK punctate epithelial keratitis; PEE punctate epithelial erosions; DES dry eye syndrome; MGD meibomian gland dysfunction; ATs artificial tears; PFAT's preservative free artificial tears; [place] nuclear sclerotic cataract; PSC posterior subcapsular cataract; ERM epi-retinal membrane; PVD posterior vitreous detachment; RD retinal detachment; DM diabetes mellitus; DR diabetic retinopathy; NPDR non-proliferative diabetic retinopathy; PDR proliferative diabetic retinopathy; CSME clinically significant macular  edema; DME diabetic macular edema; dbh dot blot hemorrhages; CWS cotton wool spot; POAG primary open angle glaucoma; C/D cup-to-disc ratio; HVF humphrey visual field; GVF goldmann visual field; OCT optical coherence tomography; IOP intraocular pressure; BRVO Branch retinal vein occlusion; CRVO central retinal vein occlusion; CRAO  central retinal artery occlusion; BRAO branch retinal artery occlusion; RT retinal tear; SB scleral buckle; PPV pars plana vitrectomy; VH Vitreous hemorrhage; PRP panretinal laser photocoagulation; IVK intravitreal kenalog; VMT vitreomacular traction; MH Macular hole;  NVD neovascularization of the disc; NVE neovascularization elsewhere; AREDS age related eye disease study; ARMD age related macular degeneration; POAG primary open angle glaucoma; EBMD epithelial/anterior basement membrane dystrophy; ACIOL anterior chamber intraocular lens; IOL intraocular lens; PCIOL posterior chamber intraocular lens; Phaco/IOL phacoemulsification with intraocular lens placement; [place] photorefractive keratectomy; LASIK laser assisted in situ keratomileusis; HTN hypertension; DM diabetes mellitus; COPD chronic obstructive pulmonary disease

## 2021-02-09 NOTE — Assessment & Plan Note (Signed)
Scheduled for cataract surgery the first date on 04/10/2021 and the next being 04/24/2021

## 2021-02-14 DIAGNOSIS — M5451 Vertebrogenic low back pain: Secondary | ICD-10-CM | POA: Diagnosis not present

## 2021-02-14 DIAGNOSIS — M48062 Spinal stenosis, lumbar region with neurogenic claudication: Secondary | ICD-10-CM | POA: Diagnosis not present

## 2021-02-20 ENCOUNTER — Ambulatory Visit: Payer: Medicare Other | Admitting: Neurology

## 2021-03-02 DIAGNOSIS — E785 Hyperlipidemia, unspecified: Secondary | ICD-10-CM | POA: Diagnosis not present

## 2021-03-02 DIAGNOSIS — M5416 Radiculopathy, lumbar region: Secondary | ICD-10-CM | POA: Diagnosis not present

## 2021-03-02 DIAGNOSIS — I700 Atherosclerosis of aorta: Secondary | ICD-10-CM | POA: Diagnosis not present

## 2021-03-13 ENCOUNTER — Other Ambulatory Visit: Payer: Self-pay

## 2021-03-13 ENCOUNTER — Encounter: Payer: Self-pay | Admitting: Neurology

## 2021-03-13 ENCOUNTER — Ambulatory Visit (INDEPENDENT_AMBULATORY_CARE_PROVIDER_SITE_OTHER): Payer: Medicare Other | Admitting: Neurology

## 2021-03-13 VITALS — BP 156/93 | HR 84 | Ht 72.0 in | Wt 287.2 lb

## 2021-03-13 DIAGNOSIS — R251 Tremor, unspecified: Secondary | ICD-10-CM

## 2021-03-13 DIAGNOSIS — G252 Other specified forms of tremor: Secondary | ICD-10-CM

## 2021-03-13 DIAGNOSIS — G20 Parkinson's disease: Secondary | ICD-10-CM

## 2021-03-13 DIAGNOSIS — I2510 Atherosclerotic heart disease of native coronary artery without angina pectoris: Secondary | ICD-10-CM

## 2021-03-13 DIAGNOSIS — Z820 Family history of epilepsy and other diseases of the nervous system: Secondary | ICD-10-CM

## 2021-03-13 NOTE — Patient Instructions (Addendum)
DAT Scan If DAT Scan is positive - Start Sinemet, refer to [facility] If negative - Either try primidone again or sinemet and possible still send to [facility]   Brain DaTscan How to prepare and what to expect What is a brain DaTscan? A brain DaTscan is a nuclear medicine scan. It uses radioactive material to diagnose some diseases of the brain, especially those that cause tremor (shakiness). DaTscan is a brand name for a drug called ioflupane I-123. A brain DaTscan is a form of radiology, because radiation is used to take pictures of the body. This radioactive drug is ordered especially for you. Because of this, we need at least 72 hours' notice if you must cancel or reschedule your scan.   How does the scan work? You will be given a small dose of tracer (radioactive material) through an intravenous (IV) line. This tracer will collect in part of your brain and give off gamma rays. A special camera called a gamma camera will use these rays to produce pictures and measurements of your brain. How do I prepare? Some drugs will affect the results of your brain DaTscan. You will need to stop taking these drugs before your scan. The table on page 2 lists the drugs that need to be stopped, and for how many days before your scan. This list is in alphabetical order by the generic name of the drug. The common brand names are listed beneath the generic name. Please confirm these instructions with your doctor who prescribed the drug. Drugs to Stop Taking Before your scan, stop taking these medicines for the length of time shown: Name of Drug Stop Taking  Amoxapine 4 days before  Benztropine  Cogentin 3 days before  Bupropion (Aplenzin, Budeprion, Voxra, Wellbutrin, Zyban) 48 hours before  Buspirone 15 hours before  Citalopram 24 hours before  Cocaine 6 hours before  Escitalopram 24 hours before  Methamphetamine 24 hours before  Methylphenidate (Concerta, Metadate, Methylin, Ritalin) 20 hours  before  Paroxetine 24 hours before  Selegilene 48 hours before  Sertraline 3 days before  If you are breastfeeding, or if there is any chance you are pregnant, please tell the scheduler or technologist (the person who will help you prepare for your scan). How is the scan done? When you first arrive, we will ask you to drink a small cup of water with potassium iodine in it. This water may have a metallic taste.  An hour after you drink the potassium iodine water, the technologist will inject a small amount of tracer into a vein in your arm or hand through your IV.  You must stay in the department for 30 minutes after the injection.  You will then have a break for 3 hours. It is OK to eat and drink during this break.  You must return to the clinic after this 3-hour break to have images of your brain taken.  Then, 4 hours after you receive your tracer injection, the technologist will take images of your brain with the gamma camera. You will lie flat on the exam table while these images are being taken.  You must not move while the camera is taking pictures. If you move, the pictures will be blurry and may have to be taken again.  Taking the images will take 40 to 45 minutes. Your total time in the imaging room will be about 1 hour.  You may also have a low-dose CT scan of your brain to help confirm any results.  A CT scan is another way to take images inside your body.  It will take about 5 hours from the time you drink the potassium iodine water until the scans are complete. What will I feel during the scan? The technologist will help make you as comfortable as possible on the exam table for the scan.  You may feel some minor discomfort from the IV.  Lying still on the exam table may be hard for some patients.  The camera will be close to your head. This may make you feel confined or uneasy (claustrophobic). Please tell the doctor who referred you for this scan if you know you are  claustrophobic. Are there any side effects from the scan? Most of the radioactivity from the tracer will pass out of your body in your urine or stool. The rest simply goes away over time.  Bad reactions to this scan are very rare. Fewer than 1% of patients (fewer than 1 out of 100) have a bad reaction. Reactions may include headache, nausea, vertigo (dizziness), or dry mouth. How do I get the results? When the test is over, the nuclear medicine doctor will review your images, prepare a written report, and talk with your doctor about the results. Your doctor will then talk with you about the results and your treatment options. If you needed to stop taking any medicines on the day of your scan, ask your doctor when to start taking them again.  The potentially interfering drugs consist of: amoxapine, amphetamine, benztropine, bupropion, buspirone, citalopram, cocaine, mazindol, methamphetamine, methylphenidate, norephedrine, phentermine, escitalopram,  phenylpropanolamine, selegiline, paroxetine, and sertraline   Peripheral Neuropathy Peripheral neuropathy is a type of nerve damage. It affects nerves that carry signals between the spinal cord and the arms, legs, and the rest of the body (peripheral nerves). It does not affect nerves in the spinal cord or brain. In peripheral neuropathy, one nerve or a group of nerves may be damaged. Peripheral neuropathy is a broad category that includes many specific nerve disorders, like diabetic neuropathy, hereditary neuropathy, and carpal tunnel syndrome. What are the causes? This condition may be caused by: Diabetes. This is the most common cause of peripheral neuropathy. Nerve injury. Pressure or stress on a nerve that lasts a long time. Lack (deficiency) of B vitamins. This can result from alcoholism, poor diet, or a restricted diet. Infections. Autoimmune diseases, such as rheumatoid arthritis and systemic lupus erythematosus. Nerve diseases that are passed  from parent to child (inherited). Some medicines, such as cancer medicines (chemotherapy). Poisonous (toxic) substances, such as lead and mercury. Too little blood flowing to the legs. Kidney disease. Thyroid disease. In some cases, the cause of this condition is not known. What are the signs or symptoms? Symptoms of this condition depend on which of your nerves is damaged. Common symptoms include: Loss of feeling (numbness) in the feet, hands, or both. Tingling in the feet, hands, or both. Burning pain. Very sensitive skin. Weakness. Not being able to move a part of the body (paralysis). Muscle twitching. Clumsiness or poor coordination. Loss of balance. Not being able to control your bladder. Feeling dizzy. Sexual problems. How is this diagnosed? Diagnosing and finding the cause of peripheral neuropathy can be difficult. Your health care provider will take your medical history and do a physical exam. A neurological exam will also be done. This involves checking things that are affected by your brain, spinal cord, and nerves (nervous system). For example, your health care provider will check your reflexes,  how you move, and what you can feel. You may have other tests, such as: Blood tests. Electromyogram (EMG) and nerve conduction tests. These tests check nerve function and how well the nerves are controlling the muscles. Imaging tests, such as CT scans or MRI to rule out other causes of your symptoms. Removing a small piece of nerve to be examined in a lab (nerve biopsy). Removing and examining a small amount of the fluid that surrounds the brain and spinal cord (lumbar puncture). How is this treated? Treatment for this condition may involve: Treating the underlying cause of the neuropathy, such as diabetes, kidney disease, or vitamin deficiencies. Stopping medicines that can cause neuropathy, such as chemotherapy. Medicine to help relieve pain. Medicines may include: Prescription  or over-the-counter pain medicine. Antiseizure medicine. Antidepressants. Pain-relieving patches that are applied to painful areas of skin. Surgery to relieve pressure on a nerve or to destroy a nerve that is causing pain. Physical therapy to help improve movement and balance. Devices to help you move around (assistive devices). Follow these instructions at home: Medicines Take over-the-counter and prescription medicines only as told by your health care provider. Do not take any other medicines without first asking your health care provider. Do not drive or use heavy machinery while taking prescription pain medicine. Lifestyle  Do not use any products that contain nicotine or tobacco, such as cigarettes and e-cigarettes. Smoking keeps blood from reaching damaged nerves. If you need help quitting, ask your health care provider. Avoid or limit alcohol. Too much alcohol can cause a vitamin B deficiency, and vitamin B is needed for healthy nerves. Eat a healthy diet. This includes: Eating foods that are high in fiber, such as fresh fruits and vegetables, whole grains, and beans. Limiting foods that are high in fat and processed sugars, such as fried or sweet foods. General instructions  If you have diabetes, work closely with your health care provider to keep your blood sugar under control. If you have numbness in your feet: Check every day for signs of injury or infection. Watch for redness, warmth, and swelling. Wear padded socks and comfortable shoes. These help protect your feet. Develop a good support system. Living with peripheral neuropathy can be stressful. Consider talking with a mental health specialist or joining a support group. Use assistive devices and attend physical therapy as told by your health care provider. This may include using a walker or a cane. Keep all follow-up visits as told by your health care provider. This is important. Contact a health care provider if: You  have new signs or symptoms of peripheral neuropathy. You are struggling emotionally from dealing with peripheral neuropathy. Your pain is not well-controlled. Get help right away if: You have an injury or infection that is not healing normally. You develop new weakness in an arm or leg. You have fallen or do so frequently. Summary Peripheral neuropathy is when the nerves in the arms, or legs are damaged, resulting in numbness, weakness, or pain. There are many causes of peripheral neuropathy, including diabetes, pinched nerves, vitamin deficiencies, autoimmune disease, and hereditary conditions. Diagnosing and finding the cause of peripheral neuropathy can be difficult. Your health care provider will take your medical history, do a physical exam, and do tests, including blood tests and nerve function tests. Treatment involves treating the underlying cause of the neuropathy and taking medicines to help control pain. Physical therapy and assistive devices may also help. This information is not intended to replace advice given to you  by your health care provider. Make sure you discuss any questions you have with your health care provider. Document Revised: 12/29/2019 Document Reviewed: 12/29/2019 Elsevier Patient Education  2022 [employer].

## 2021-03-13 NOTE — Progress Notes (Signed)
[facility]    Provider:  Dr Jaynee Eagles Referring Provider: Lyman Bishop, DO, Starlyn Skeans Primary Care Physician:  Lyman Bishop, DO, Starlyn Skeans   CC:  Numbness in feet  Tremor just in the left and and left foot. Also in Todd face. Sister was diagnosed with parkinson's disease. Primidone 40mh tid did not seem to help. Reviewed Dr. Garth Bigness notes, thought he may have essential tremor but today with severe resting tremor left >> right hand, possibly some cogwheeling, tremor in the head and leg. Difficult to assess does not fit clinical criteria of PD but tremor is course and severe and resting with some cogwheeling, gait without shuffling. Discussed  DAT Scan. Still with intact distal sensation to pp, vibration and cold.Tremor face and left leg. Very good expression on face. Small handwriting. No smell or taste for 20 years. No vivid dreams Some orthostatic dizziness. No drooling. 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 Lateral recess stenosis at L2-3, L3-4, L4-5 and L5-S1. Potential for neural compression in the lateral recesses particularly at L3-4 and L4-5.  Reviewed images and agree  Patient complains of symptoms per HPI as well as the following symptoms: tremor . Pertinent negatives and positives per HPI. All others negative   HPI:  Todd Chan is a 75 y.o. male here as a referral from Dr. Curly Rim for neuropathy.  Past medical history hypertension, GERD.He had left knee replacement in July. A month after it started in the left leg and then the right. It is in the balls and the heels and the ankles are numb. A little in the toes. Not painful. No burning or tingling. He is fine in bed. Worse when on Todd feet for quite a  bit. When he walks down a slope he feels a little imbalance. Here with Todd Chan who also provides information. When he gets out of a car he can feel the numbness in the ankle. Not tight shoes. He is going to a chiropractor for shoulder pains, has helped. He has some low back pain but no radicular symptoms. He has knee problems, going for a knee replacement on the right knee. Stable. Chan is here and also provides information. No other focal neurologic deficits, associated symptoms, inciting events or modifiable factors.  Reviewed notes, labs and imaging from outside physicians, which showed:  Reviewed referring physician notes.  Since Todd left knee replacement he developed some numbness in both feet and ankles.  Strength was July 2009.  He is currently having some numbness in the balls of Todd feet and medial ankles.  Been there for 5-6 weeks.  The numbness comes and goes.  Not disruptive to him at all.  He will sometimes get worse with standing for long periods of time.  No weakness or changes in Todd balance.  He will sometimes feel very slightly wobbly but no falls.  He is never had any issues with anemia or vitamin deficiency.  No dietary restrictions.  Hemoglobin A1c 5.5, TSH 2.32, I don;t see a B12 value.   Review of Systems: Patient complains of symptoms per HPI as well as the following symptoms: numbness. Pertinent negatives and positives per HPI. All others negative.   Social History   Socioeconomic History   Marital status: Married  Spouse name: Not on file   Number of children: 2   Years of education: Not on file   Highest education level: Bachelor's degree (e.g., BA, AB, BS)  Occupational History   Not on file  Tobacco Use   Smoking status: Former    Packs/day: 1.00    Years: 20.00    Pack years: 20.00    Types: Cigarettes    Quit date: 10/1987    Years since quitting: 33.4   Smokeless tobacco: Never   Tobacco comments:    quit 30 yeara ago   Vaping Use   Vaping Use:  Never used  Substance and Sexual Activity   Alcohol use: Yes    Alcohol/week: 7.0 standard drinks    Types: 7 Glasses of wine per week    Comment: 3 drinks per day- wine and scotch   Drug use: No   Sexual activity: Yes  Other Topics Concern   Not on file  Social History Narrative   Lives at home with Todd Chan   Left handed   Drinks 2-3 cups of caffeine daily (coffee)   Social Determinants of Health   Financial Resource Strain: Not on file  Food Insecurity: Not on file  Transportation Needs: Not on file  Physical Activity: Not on file  Stress: Not on file  Social Connections: Not on file  Intimate Partner Violence: Not on file    Family History  Problem Relation Age of Onset   Breast cancer Mother 87   Prostate cancer Father    Breast cancer Sister 52   Neuropathy Neg Hx     Past Medical History:  Diagnosis Date   Arthritis    GERD (gastroesophageal reflux disease)    Hypertension    Osteoarthritis of knee    Painful urination    Peripheral vascular disease ([place])    bilateral edema     Past Surgical History:  Procedure Laterality Date   CARDIAC CATHETERIZATION  04/2017   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   KNEE ARTHROSCOPY Left 6-7 years ago   LEFT HEART CATH AND CORONARY ANGIOGRAPHY N/A 04/17/2017   Procedure: LEFT HEART CATH AND CORONARY ANGIOGRAPHY;  Surgeon: Troy Sine, MD;  Location: [facility];  Service: Cardiovascular;  Laterality: N/A;   RADIOLOGY WITH ANESTHESIA N/A 08/30/2020   Procedure: MRI WITH ANESTHESIA BRAIN WITH AND WITHOUT AND MRI CERVICAL SPINE WITH AND WITHOUT;  Surgeon: Radiologist, Medication, MD;  Location: [facility];  Service: Radiology;  Laterality: N/A;   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     Cholecalciferol (VITAMIN D3) 50 MCG (2000 UT) capsule Take 2,000 Units by mouth daily.     Coenzyme Q10 (COQ10 PO) Take 100 mg by mouth daily.     metoprolol tartrate (LOPRESSOR) 25 MG tablet TAKE 1 TABLET TWICE A DAY 180 tablet 3   Multiple Vitamin (MULTIVITAMIN) capsule Take 1 capsule by mouth daily.     Omega-3  Fatty Acids (FISH OIL PO) Take 1,290 mg by mouth daily.     telmisartan-hydrochlorothiazide (MICARDIS HCT) 40-12.5 MG tablet TAKE 1 TABLET DAILY 90 tablet 3   No current facility-administered medications for this visit.    Allergies as of 03/13/2021   (No Known Allergies)    Vitals: BP (!) 156/93   Pulse 84   Ht 6' (1.829 m)   Wt 287 lb 3.2 oz (130.3 kg)   BMI 38.95 kg/m  Last Weight:  Wt Readings from Last 1 Encounters:  03/13/21 287 lb 3.2 oz (130.3 kg)   Last Height:   Ht Readings from Last 1 Encounters:  03/13/21 6' (1.829 m)    Physical exam: Exam: Gen: NAD, conversant, well nourised, obese, well groomed                     CV: RRR, no MRG. No Carotid Bruits. No peripheral edema, warm, nontender Eyes: Conjunctivae clear without exudates or hemorrhage  Neuro: Detailed Neurologic Exam  Speech:    Speech is normal; fluent and spontaneous with normal comprehension.  Cognition:    The patient is oriented to person, place, and time;     recent and remote memory intact;     language fluent;     normal attention, concentration,     fund of knowledge Cranial Nerves:    The pupils are equal, round, and reactive to light.attempted fundoscopic exam could not visualize.. Visual fields are full to finger confrontation. Extraocular movements are intact.  Trigeminal sensation is intact and the muscles of mastication are normal. Left ptosis. The palate elevates in the midline. Hearing intact. Voice is normal. Shoulder shrug is normal. The tongue has normal motion without fasciculations.   Coordination:    Normal finger to nose and heel to shin. Normal rapid alternating movements.   Gait:    Slightly stooped but not shuffling, reemergent tremor  Motor Observation:    Head tremor Tone:    Normal muscle tone.     Strength:    Strength is V/V in the upper and lower limbs.      Sensation: intact to LT, pin prick, temp distally in feet     Reflex Exam:  DTR's:      AJs 1+.  Toes:    The toes are downgoing bilaterally.   Clonus:    Clonus is absent.      Assessment/Plan: This is a very lovely 75 year old male with a past medical history of morbid obesity, knee pain and replacements, hypertension, GERD.  He is here for symptoms in the feet and tremor.     DAT Scan: to differentiate between essential tremor and parkinson's disease.  Reviewed Dr. Garth Bigness notes, thought he may have essential tremor but today with severe resting tremor left >> right hand, possibly some cogwheeling that looks like PD but tremor in the head more like ET. Difficult to assess does not fit clinical criteria of PD but tremor is course and severe and resting with some cogwheeling, gait without shuffling, reemergent tremor. NO masked facies but small handwriting and loss of taste ans smell. Unclear ET vs Parkinsonian syndrome. Discussed  DAT Scan.   - DAT Scan: If DAT Scan is positive Start Sinemet, refer to [facility]; If negative either try primidone again qhs or sinemet and possible still send to [facility]  - Exam is still essentially normal for distal pinprick, temperature and reflexes.  No neuropathy on exam, has been extensively serum tested. May be  due to Todd lumnosacral disease.   -He is morbidly obese, he stops breathing in the middle of the night per Todd  Chan.  I do recommend a sleep apnea evaluation, discussed sequelae of untreated sleep apnea including increased risk of stroke, cardiovascular, respiratory and others.  They declined at this time, encouraged him to speak with PCP about this. Weight loss is highly recommended.  -Patient complains of decreased balance when walking downhill, still likely multifactorial including Todd knees, large body habitus, possibly lumbar stenosis and not exercising or performing balance exercises.  Encourage physical therapy and balance, gait and safety evaluation and treatment.    -Return to neurology clinic after DAT Scan   Cc:  Orpah Melter, MD, Starlyn Skeans     Sarina Ill, MD  [facility] [address] [place], [place] [address]  Phone [phone] Fax [phone]  I spent over 80 minutes of face-to-face and non-face-to-face time with patient on the  1. Tremor   2. 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 Resting tremor   4. FHx: Parkinson's disease    diagnosis.  This included previsit chart review, lab review, study review, order entry, electronic health record documentation, patient education on the different diagnostic and therapeutic options, counseling and coordination of care, risks and benefits of management, compliance, or risk factor reduction

## 2021-03-14 DIAGNOSIS — M5416 Radiculopathy, lumbar region: Secondary | ICD-10-CM | POA: Diagnosis not present

## 2021-03-16 ENCOUNTER — Telehealth: Payer: Self-pay | Admitting: Neurology

## 2021-03-16 NOTE — Telephone Encounter (Signed)
Medicare no auth req & [employer] no Wendi Snipes Ref # [id number] sent to [facility] . They will reach out to the patient to schedule.

## 2021-03-29 NOTE — Telephone Encounter (Signed)
Todd Chan with Nuclear medicine stated "He is very claustrophobic and has declined to schedule the appt for his datscan. he said he will reach out to ordering physician and him and I will talk late next week with a game plan"

## 2021-04-09 ENCOUNTER — Encounter: Payer: Self-pay | Admitting: Neurology

## 2021-04-11 ENCOUNTER — Encounter: Payer: Self-pay | Admitting: Neurology

## 2021-04-11 DIAGNOSIS — M542 Cervicalgia: Secondary | ICD-10-CM

## 2021-04-11 DIAGNOSIS — G20 Parkinson's disease: Secondary | ICD-10-CM

## 2021-04-11 DIAGNOSIS — R251 Tremor, unspecified: Secondary | ICD-10-CM

## 2021-04-11 DIAGNOSIS — M47812 Spondylosis without myelopathy or radiculopathy, cervical region: Secondary | ICD-10-CM

## 2021-04-12 NOTE — Telephone Encounter (Signed)
Referral has been sent to patient's requested location.

## 2021-04-13 ENCOUNTER — Encounter: Payer: Self-pay | Admitting: Cardiology

## 2021-04-13 ENCOUNTER — Other Ambulatory Visit: Payer: Self-pay

## 2021-04-13 ENCOUNTER — Encounter: Payer: Self-pay | Admitting: Podiatry

## 2021-04-13 ENCOUNTER — Ambulatory Visit (INDEPENDENT_AMBULATORY_CARE_PROVIDER_SITE_OTHER): Payer: Medicare Other | Admitting: Podiatry

## 2021-04-13 DIAGNOSIS — B351 Tinea unguium: Secondary | ICD-10-CM | POA: Diagnosis not present

## 2021-04-13 DIAGNOSIS — I739 Peripheral vascular disease, unspecified: Secondary | ICD-10-CM

## 2021-04-13 DIAGNOSIS — M79674 Pain in right toe(s): Secondary | ICD-10-CM | POA: Diagnosis not present

## 2021-04-13 DIAGNOSIS — G609 Hereditary and idiopathic neuropathy, unspecified: Secondary | ICD-10-CM | POA: Diagnosis not present

## 2021-04-13 DIAGNOSIS — M79675 Pain in left toe(s): Secondary | ICD-10-CM

## 2021-04-13 DIAGNOSIS — I872 Venous insufficiency (chronic) (peripheral): Secondary | ICD-10-CM

## 2021-04-13 MED ORDER — PRAVASTATIN SODIUM 20 MG PO TABS: 20.0000 mg | ORAL_TABLET | Freq: Every day | ORAL | 0 refills | Status: DC

## 2021-04-13 NOTE — Progress Notes (Signed)
°  Subjective:  Patient ID: Todd Chan, male    DOB: 1945-07-07,   MRN: [id number]  Chief Complaint  Patient presents with   Nail Problem     Routine foot care    76 y.o. male presents for concern of painful thickened and elongated toenails with a history of PVD and neuropathy. Denies diabetes. Hoping to have nails trimmed as well.  . Denies any other pedal complaints. Denies n/v/f/c.   PCP: Crissie Sickles DO   Past Medical History:  Diagnosis Date   Arthritis    GERD (gastroesophageal reflux disease)    Hypertension    Osteoarthritis of knee    Painful urination    Peripheral vascular disease ([place])    bilateral edema     Objective:  Physical Exam: Vascular: DP/PT pulses 1/4 bilateral. CFT <3 seconds. Normal hair growth on digits. No edema.  Skin. No lacerations or abrasions bilateral feet. Nails 1-5 are thickened discolored and elongated with subungual debris.  Musculoskeletal: MMT 5/5 bilateral lower extremities in DF, PF, Inversion and Eversion. Deceased ROM in DF of ankle joint. No pain to palpation.  Neurological: Sensation diminished to light touch. Protective sensation present to all 10 locations with SWMF.   Assessment:   1. Pain due to onychomycosis of toenails of both feet   2. Idiopathic peripheral neuropathy   3. Venous (peripheral) insufficiency   4. Peripheral vascular disease ([place])       Plan:  Patient was evaluated and treated and all questions answered. Discussed neuropathy and etiology as well as treatment with patient.  -Discussed and educated patient on foot care, especially with  regards to the vascular, neurological and musculoskeletal systems.  -Discussed supportive shoes at all times and checking feet regularly.  -Nails 1-5 b/l debrided today without incident.  -Patient to return in 3 months for at risk foot care.    Lorenda Peck, DPM

## 2021-04-13 NOTE — Telephone Encounter (Signed)
There is a chance that the combination of Sinemet with his BP medications can increase risk of hypotension.  It should be fine for him to try, just advise him to be careful with positional changes (getting out of bed, bending over...).

## 2021-04-14 DIAGNOSIS — R972 Elevated prostate specific antigen [PSA]: Secondary | ICD-10-CM | POA: Diagnosis not present

## 2021-04-18 DIAGNOSIS — B356 Tinea cruris: Secondary | ICD-10-CM | POA: Diagnosis not present

## 2021-04-18 DIAGNOSIS — Q540 Hypospadias, balanic: Secondary | ICD-10-CM | POA: Diagnosis not present

## 2021-04-18 DIAGNOSIS — R972 Elevated prostate specific antigen [PSA]: Secondary | ICD-10-CM | POA: Diagnosis not present

## 2021-04-20 ENCOUNTER — Encounter: Payer: Self-pay | Admitting: Neurology

## 2021-04-20 ENCOUNTER — Other Ambulatory Visit: Payer: Self-pay | Admitting: Neurology

## 2021-04-20 MED ORDER — CARBIDOPA-LEVODOPA 25-100 MG PO TABS: 0.5000 | ORAL_TABLET | Freq: Three times a day (TID) | ORAL | 5 refills | Status: DC

## 2021-04-21 DIAGNOSIS — K2100 Gastro-esophageal reflux disease with esophagitis, without bleeding: Secondary | ICD-10-CM | POA: Diagnosis not present

## 2021-04-21 DIAGNOSIS — G250 Essential tremor: Secondary | ICD-10-CM | POA: Diagnosis not present

## 2021-04-21 DIAGNOSIS — I10 Essential (primary) hypertension: Secondary | ICD-10-CM | POA: Diagnosis not present

## 2021-04-21 DIAGNOSIS — M720 Palmar fascial fibromatosis [Dupuytren]: Secondary | ICD-10-CM | POA: Diagnosis not present

## 2021-04-21 DIAGNOSIS — G629 Polyneuropathy, unspecified: Secondary | ICD-10-CM | POA: Diagnosis not present

## 2021-04-21 DIAGNOSIS — R42 Dizziness and giddiness: Secondary | ICD-10-CM | POA: Diagnosis not present

## 2021-04-21 DIAGNOSIS — R110 Nausea: Secondary | ICD-10-CM | POA: Diagnosis not present

## 2021-04-21 DIAGNOSIS — I7121 Aneurysm of the ascending aorta, without rupture: Secondary | ICD-10-CM | POA: Diagnosis not present

## 2021-04-21 DIAGNOSIS — N401 Enlarged prostate with lower urinary tract symptoms: Secondary | ICD-10-CM | POA: Diagnosis not present

## 2021-04-24 DIAGNOSIS — R519 Headache, unspecified: Secondary | ICD-10-CM | POA: Diagnosis not present

## 2021-04-24 DIAGNOSIS — M47812 Spondylosis without myelopathy or radiculopathy, cervical region: Secondary | ICD-10-CM | POA: Diagnosis not present

## 2021-04-24 DIAGNOSIS — M542 Cervicalgia: Secondary | ICD-10-CM | POA: Diagnosis not present

## 2021-04-27 DIAGNOSIS — M47812 Spondylosis without myelopathy or radiculopathy, cervical region: Secondary | ICD-10-CM | POA: Diagnosis not present

## 2021-04-27 DIAGNOSIS — M542 Cervicalgia: Secondary | ICD-10-CM | POA: Diagnosis not present

## 2021-04-27 DIAGNOSIS — R519 Headache, unspecified: Secondary | ICD-10-CM | POA: Diagnosis not present

## 2021-05-02 DIAGNOSIS — M47812 Spondylosis without myelopathy or radiculopathy, cervical region: Secondary | ICD-10-CM | POA: Diagnosis not present

## 2021-05-02 DIAGNOSIS — M542 Cervicalgia: Secondary | ICD-10-CM | POA: Diagnosis not present

## 2021-05-02 DIAGNOSIS — R519 Headache, unspecified: Secondary | ICD-10-CM | POA: Diagnosis not present

## 2021-05-04 ENCOUNTER — Encounter (INDEPENDENT_AMBULATORY_CARE_PROVIDER_SITE_OTHER): Payer: Medicare Other | Admitting: Ophthalmology

## 2021-05-05 DIAGNOSIS — M47812 Spondylosis without myelopathy or radiculopathy, cervical region: Secondary | ICD-10-CM | POA: Diagnosis not present

## 2021-05-05 DIAGNOSIS — M542 Cervicalgia: Secondary | ICD-10-CM | POA: Diagnosis not present

## 2021-05-05 DIAGNOSIS — R519 Headache, unspecified: Secondary | ICD-10-CM | POA: Diagnosis not present

## 2021-05-08 ENCOUNTER — Encounter (INDEPENDENT_AMBULATORY_CARE_PROVIDER_SITE_OTHER): Payer: Medicare Other | Admitting: Ophthalmology

## 2021-05-08 ENCOUNTER — Encounter: Payer: Self-pay | Admitting: Neurology

## 2021-05-08 DIAGNOSIS — R519 Headache, unspecified: Secondary | ICD-10-CM | POA: Diagnosis not present

## 2021-05-08 DIAGNOSIS — M47812 Spondylosis without myelopathy or radiculopathy, cervical region: Secondary | ICD-10-CM | POA: Diagnosis not present

## 2021-05-08 DIAGNOSIS — M542 Cervicalgia: Secondary | ICD-10-CM | POA: Diagnosis not present

## 2021-05-09 ENCOUNTER — Ambulatory Visit (INDEPENDENT_AMBULATORY_CARE_PROVIDER_SITE_OTHER): Payer: Medicare Other | Admitting: Ophthalmology

## 2021-05-09 ENCOUNTER — Other Ambulatory Visit: Payer: Self-pay

## 2021-05-09 ENCOUNTER — Encounter (INDEPENDENT_AMBULATORY_CARE_PROVIDER_SITE_OTHER): Payer: Self-pay | Admitting: Ophthalmology

## 2021-05-09 DIAGNOSIS — H2513 Age-related nuclear cataract, bilateral: Secondary | ICD-10-CM

## 2021-05-09 DIAGNOSIS — H34812 Central retinal vein occlusion, left eye, with macular edema: Secondary | ICD-10-CM | POA: Diagnosis not present

## 2021-05-09 DIAGNOSIS — H401121 Primary open-angle glaucoma, left eye, mild stage: Secondary | ICD-10-CM | POA: Diagnosis not present

## 2021-05-09 MED ORDER — LATANOPROST 0.005 % OP SOLN: 1.0000 [drp] | Freq: Every day | OPHTHALMIC | 3 refills | Status: AC

## 2021-05-09 MED ORDER — BEVACIZUMAB 2.5 MG/0.1ML IZ SOSY
2.5000 mg | PREFILLED_SYRINGE | INTRAVITREAL | Status: AC | PRN
  Administered 2021-05-09: 2.5 mg via INTRAVITREAL

## 2021-05-09 NOTE — Assessment & Plan Note (Signed)
Vastly improved left eye since onset of therapy April 2022. Currently at 13-week follow-up interval.  We will repeat injection today and follow-up next in 4 months and dilate without planned injection

## 2021-05-09 NOTE — Progress Notes (Addendum)
05/09/2021     CHIEF COMPLAINT Patient presents for  Chief Complaint  Patient presents with   Retina Follow Up      HISTORY OF PRESENT ILLNESS: Todd Chan is a 76 y.o. male who presents to the clinic today for:   HPI     Retina Follow Up           Diagnosis: Other   Laterality: both eyes   Onset: 13 weeks ago   Severity: mild   Duration: 13 weeks   Course: stable         Comments   13 week fu ou and oct and avastin OS  Pt states VA OU stable since last visit. Pt denies FOL, floaters, or ocular pain OU.  Pt states, " I was going to have cataract surgery in January but I held off to get a second opinion and now I am due to have surgery in April."        Last edited by Kendra Opitz, COA on 05/09/2021  9:23 AM.      Referring physician: Keene Breath., MD [address],  [place] [address]  HISTORICAL INFORMATION:   Selected notes from the [facility]: Current Outpatient Medications (Ophthalmic Drugs)  Medication Sig   latanoprost (XALATAN) 0.005 % ophthalmic solution Place 1 drop into the left eye daily.   No current facility-administered medications for this visit. (Ophthalmic Drugs)   Current Outpatient Medications (Other)  Medication Sig   ASPIRIN 81 PO Take 81 mg by mouth daily.   carbidopa-levodopa (SINEMET IR) 25-100 MG tablet Take 0.5-1 tablets by mouth 3 (three) times daily.   Cholecalciferol (VITAMIN D3) 50 MCG (2000 UT) capsule Take 2,000 Units by mouth daily.   Coenzyme Q10 (COQ10 PO) Take 100 mg by mouth daily.   metoprolol tartrate (LOPRESSOR) 25 MG tablet TAKE 1 TABLET TWICE A DAY   Multiple Vitamin (MULTIVITAMIN) capsule Take 1 capsule by mouth daily.   Omega-3 Fatty Acids (FISH OIL PO) Take 1,290 mg by mouth daily.   pravastatin (PRAVACHOL) 20 MG tablet Take 1 tablet (20 mg total) by mouth daily.   telmisartan-hydrochlorothiazide (MICARDIS HCT) 40-12.5 MG tablet TAKE 1  TABLET DAILY   No current facility-administered medications for this visit. 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   KNEE ARTHROSCOPY Left 6-7 years ago   LEFT HEART CATH AND CORONARY ANGIOGRAPHY N/A 04/17/2017   Procedure: LEFT HEART CATH AND CORONARY ANGIOGRAPHY;  Surgeon: Troy Sine, MD;  Location: [facility];  Service: Cardiovascular;  Laterality: N/A;   RADIOLOGY WITH ANESTHESIA N/A 08/30/2020   Procedure: MRI WITH ANESTHESIA BRAIN WITH AND WITHOUT AND MRI CERVICAL SPINE WITH AND WITHOUT;  Surgeon: Radiologist, Medication, MD;  Location: [facility];  Service: Radiology;  Laterality: N/A;   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   TONSILLECTOMY     TOTAL KNEE ARTHROPLASTY Left  10/08/2016   Procedure: LEFT TOTAL KNEE ARTHROPLASTY;  Surgeon: Gaynelle Arabian, MD;  Location: [facility];  Service: Orthopedics;  Laterality: Left;   TOTAL KNEE ARTHROPLASTY Right 06/17/2017   Procedure: RIGHT TOTAL KNEE ARTHROPLASTY;  Surgeon: Gaynelle Arabian, MD;  Location: [facility];  Service: Orthopedics;  Laterality: Right;    FAMILY HISTORY Family History  Problem Relation Age of Onset   Breast cancer Mother 30   Prostate cancer Father    Breast cancer Sister 27   Neuropathy Neg Hx     SOCIAL HISTORY Social History   Tobacco Use   Smoking status: Former    Packs/day: 1.00    Years: 20.00    Pack years: 20.00    Types: Cigarettes    Quit date: 10/1987    Years since quitting: 33.6   Smokeless tobacco: Never   Tobacco comments:    quit 30  yeara ago   Vaping Use   Vaping Use: Never used  Substance Use Topics   Alcohol use: Yes    Alcohol/week: 7.0 standard drinks    Types: 7 Glasses of wine per week    Comment: 3 drinks per day- wine and scotch   Drug use: No         OPHTHALMIC EXAM:  Base Eye Exam     Visual Acuity (ETDRS)       Right Left   Dist cc 20/40 -2 20/25 -2   Dist ph cc 20/30 +2          Tonometry (Tonopen, 9:26 AM)       Right Left   Pressure 12 22         Tonometry #2 (Tonopen, 9:26 AM)       Right Left   Pressure  23         Pupils       Pupils Shape React APD   Right PERRL Round Brisk None   Left PERRL Round Brisk None         Visual Fields (Counting fingers)       Left Right    Full Full         Extraocular Movement       Right Left    Full, Ortho Full, Ortho         Neuro/Psych     Oriented x3: Yes   Mood/Affect: Normal         Dilation     Left eye: 1.0% Mydriacyl, 2.5% Phenylephrine @ 9:26 AM           Slit Lamp and Fundus Exam     External Exam       Right Left   External Normal Normal         Slit Lamp Exam       Right Left   Lids/Lashes Normal Normal   Conjunctiva/Sclera White and quiet White and quiet   Cornea Clear Clear   Anterior Chamber Deep and quiet Deep and quiet   Iris Round and reactive Round and reactive   Lens 2+ Nuclear sclerosis  Central color change and sclerosis yet outer clear cortex 2+ Nuclear sclerosis central color change and sclerosis yet outer clear cortex   Anterior Vitreous Normal Normal         Fundus Exam       Right Left   Posterior Vitreous  Normal   Disc  Possible early collaterals   C/D Ratio  0.1   Macula  Less clinical intraretinal hemorrhage superiorly,  but no CME   Vessels  Superior Hemi central retinal vein occlusion   Periphery  Normal            IMAGING AND PROCEDURES  Imaging and Procedures for 05/09/21  OCT, Retina - OU - Both Eyes       Right Eye Quality was  good. Scan locations included subfoveal. Central Foveal Thickness: 303. Progression has been stable. Findings include normal foveal contour.   Left Eye Quality was good. Scan locations included subfoveal. Central Foveal Thickness: 307. Progression has improved. Findings include abnormal foveal contour.   Notes OS much less intraretinal hemorrhage as Hemi CRV O continues to improve now At 13-week interval, repeat injection today and follow-up next in   4 months     Intravitreal Injection, Pharmacologic Agent - OS - Left Eye       Time Out 05/09/2021. 9:49 AM. Confirmed correct patient, procedure, site, and patient consented.   Anesthesia Topical anesthesia was used. Anesthetic medications included Lidocaine 4%.   Procedure Preparation included Tobramycin 0.3%, Ofloxacin , 10% betadine to eyelids, 5% betadine to ocular surface. A 30 gauge needle was used.   Injection: 2.5 mg bevacizumab 2.5 MG/0.1ML   Route: Intravitreal, Site: Left Eye   NDC: [phone], Lot: [id number]   Post-op Post injection exam found visual acuity of at least counting fingers. The patient tolerated the procedure well. There were no complications. The patient received written and verbal post procedure care education. Post injection medications included ocuflox.              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 Currently at 13-week follow-up interval.  We will repeat injection today and follow-up next in 4 months and dilate without planned injection  Primary open-angle glaucoma, left eye, mild stage We will commence with topical latanoprost 1 drop nightly left eye only at bedtime  Have patient follow-up with Dr. Barbie Haggis  Cataract, nuclear sclerotic, both eyes Under the care of Dr. Clydene Laming and Dr. Talbert Forest     ICD-10-CM   1. Hemispheric central retinal vein occlusion (CRVO) of left  eye with macular edema  H34.8120 OCT, Retina - OU - Both Eyes    Intravitreal Injection, Pharmacologic Agent - OS - Left Eye    bevacizumab (AVASTIN) SOSY 2.5 mg    2. Primary open-angle glaucoma, left eye, mild stage  H40.1121     3. Cataract, nuclear sclerotic, both eyes  H25.13       1.  OS vastly improved macular edema from hemispheric central retinal vein occlusion left eye with onset of therapy April 2022.  No recurrence now after extension of treatment interval from 9 weeks to now 13 weeks.  Essentially this is 7 weeks off of effective therapy.  We will repeat today to maintain condition and follow-up examination only OU in 24-month  2.  Elevated intraocular pressure left eye likely open-angle glaucoma but possibly some secondary effect from this short period of use of intravitreal Avastin (not likely), nonetheless we will need to commence therapy with latanoprost left eye nightly  3.  Open-angle glaucoma follow-up and work-up as per Dr. Barbie Haggis  Ophthalmic Meds Ordered this visit:  Meds ordered this encounter  Medications   bevacizumab (AVASTIN) SOSY 2.5 mg   latanoprost (XALATAN) 0.005 % ophthalmic solution    Sig: Place 1 drop into the left eye daily.    Dispense:  2.5 mL    Refill:  3       Return in about 4 months (around 09/06/2021) for DILATE OU, COLOR FP, OCT.  There are no Patient Instructions on file for this visit.   Explained the diagnoses, plan, and follow up with the patient and they expressed understanding.  Patient expressed understanding of the importance of proper follow up care.   Clent Demark Tiegan Terpstra M.D. Diseases & Surgery of the Retina and Vitreous Retina & Diabetic Uncertain 05/09/21     Abbreviations: M myopia (nearsighted); A astigmatism; H hyperopia (farsighted); P presbyopia; Mrx spectacle prescription;  CTL contact lenses; OD right eye; OS left eye; OU both eyes  XT exotropia; ET esotropia; PEK punctate epithelial keratitis; PEE punctate  epithelial erosions; DES dry eye syndrome; MGD meibomian gland dysfunction; ATs artificial tears; PFAT's preservative free artificial tears; [place] nuclear sclerotic cataract; PSC posterior subcapsular cataract; ERM epi-retinal membrane; PVD posterior vitreous detachment; RD retinal detachment; DM diabetes mellitus; DR diabetic retinopathy; NPDR non-proliferative diabetic retinopathy; PDR proliferative diabetic retinopathy; CSME clinically significant macular edema; DME diabetic macular edema; dbh dot blot hemorrhages; CWS cotton wool spot; POAG primary open angle glaucoma; C/D cup-to-disc ratio; HVF humphrey visual field; GVF goldmann visual field; OCT optical coherence tomography; IOP intraocular pressure; BRVO Branch retinal vein occlusion; CRVO central retinal vein occlusion; CRAO central retinal artery occlusion; BRAO branch retinal artery occlusion; RT retinal tear; SB scleral buckle; PPV pars plana vitrectomy; VH Vitreous hemorrhage; PRP panretinal laser photocoagulation; IVK intravitreal kenalog; VMT vitreomacular traction; MH Macular hole;  NVD neovascularization of the disc; NVE neovascularization elsewhere; AREDS age related eye disease study; ARMD age related macular degeneration; POAG primary open angle glaucoma; EBMD epithelial/anterior basement membrane dystrophy; ACIOL anterior chamber intraocular lens; IOL intraocular lens; PCIOL posterior chamber intraocular lens; Phaco/IOL phacoemulsification with intraocular lens placement; [place] photorefractive keratectomy; LASIK laser assisted in situ keratomileusis; HTN hypertension; DM diabetes mellitus; COPD chronic obstructive pulmonary disease

## 2021-05-09 NOTE — Assessment & Plan Note (Signed)
Under the care of Dr. Clydene Laming and Dr. Talbert Forest

## 2021-05-09 NOTE — Assessment & Plan Note (Addendum)
We will commence with topical latanoprost 1 drop nightly left eye only at bedtime  Have patient follow-up with Dr. Barbie Haggis

## 2021-05-09 NOTE — Addendum Note (Signed)
Addended by: Deloria Lair A on: 05/09/2021 10:04 AM   Modules accepted: Orders

## 2021-05-12 DIAGNOSIS — M47812 Spondylosis without myelopathy or radiculopathy, cervical region: Secondary | ICD-10-CM | POA: Diagnosis not present

## 2021-05-12 DIAGNOSIS — M542 Cervicalgia: Secondary | ICD-10-CM | POA: Diagnosis not present

## 2021-05-12 DIAGNOSIS — R519 Headache, unspecified: Secondary | ICD-10-CM | POA: Diagnosis not present

## 2021-05-15 ENCOUNTER — Encounter (HOSPITAL_BASED_OUTPATIENT_CLINIC_OR_DEPARTMENT_OTHER): Payer: Self-pay

## 2021-05-15 ENCOUNTER — Emergency Department (HOSPITAL_BASED_OUTPATIENT_CLINIC_OR_DEPARTMENT_OTHER): Payer: Medicare Other

## 2021-05-15 ENCOUNTER — Other Ambulatory Visit: Payer: Self-pay

## 2021-05-15 ENCOUNTER — Emergency Department (HOSPITAL_BASED_OUTPATIENT_CLINIC_OR_DEPARTMENT_OTHER)
Admission: EM | Admit: 2021-05-15 | Discharge: 2021-05-15 | Disposition: A | Discharge status: Home / Self Care | Payer: Medicare Other | Attending: Emergency Medicine | Admitting: Emergency Medicine

## 2021-05-15 DIAGNOSIS — R251 Tremor, unspecified: Secondary | ICD-10-CM | POA: Diagnosis not present

## 2021-05-15 DIAGNOSIS — R5381 Other malaise: Secondary | ICD-10-CM | POA: Diagnosis not present

## 2021-05-15 DIAGNOSIS — R42 Dizziness and giddiness: Secondary | ICD-10-CM | POA: Insufficient documentation

## 2021-05-15 DIAGNOSIS — M2550 Pain in unspecified joint: Secondary | ICD-10-CM | POA: Diagnosis not present

## 2021-05-15 DIAGNOSIS — R110 Nausea: Secondary | ICD-10-CM | POA: Diagnosis not present

## 2021-05-15 DIAGNOSIS — Z79899 Other long term (current) drug therapy: Secondary | ICD-10-CM | POA: Diagnosis not present

## 2021-05-15 DIAGNOSIS — J9811 Atelectasis: Secondary | ICD-10-CM | POA: Diagnosis not present

## 2021-05-15 DIAGNOSIS — Z7982 Long term (current) use of aspirin: Secondary | ICD-10-CM | POA: Diagnosis not present

## 2021-05-15 DIAGNOSIS — Z96653 Presence of artificial knee joint, bilateral: Secondary | ICD-10-CM | POA: Insufficient documentation

## 2021-05-15 DIAGNOSIS — R209 Unspecified disturbances of skin sensation: Secondary | ICD-10-CM | POA: Insufficient documentation

## 2021-05-15 DIAGNOSIS — I10 Essential (primary) hypertension: Secondary | ICD-10-CM | POA: Diagnosis not present

## 2021-05-15 DIAGNOSIS — R918 Other nonspecific abnormal finding of lung field: Secondary | ICD-10-CM | POA: Diagnosis not present

## 2021-05-15 DIAGNOSIS — R030 Elevated blood-pressure reading, without diagnosis of hypertension: Secondary | ICD-10-CM

## 2021-05-15 DIAGNOSIS — R519 Headache, unspecified: Secondary | ICD-10-CM | POA: Diagnosis not present

## 2021-05-15 DIAGNOSIS — R5383 Other fatigue: Secondary | ICD-10-CM | POA: Diagnosis not present

## 2021-05-15 LAB — COMPREHENSIVE METABOLIC PANEL
ALT: 22 U/L (ref 0–44)
AST: 26 U/L (ref 15–41)
Albumin: 4.2 g/dL (ref 3.5–5.0)
Alkaline Phosphatase: 29 U/L — ABNORMAL LOW (ref 38–126)
Anion gap: 10 (ref 5–15)
BUN: 13 mg/dL (ref 8–23)
CO2: 28 mmol/L (ref 22–32)
Calcium: 9.2 mg/dL (ref 8.9–10.3)
Chloride: 96 mmol/L — ABNORMAL LOW (ref 98–111)
Creatinine, Ser: 0.88 mg/dL (ref 0.61–1.24)
GFR, Estimated: >60 mL/min (ref >60)
Glucose, Bld: 113 mg/dL — ABNORMAL HIGH (ref 70–99)
Potassium: 3.8 mmol/L (ref 3.5–5.1)
Sodium: 134 mmol/L — ABNORMAL LOW (ref 135–145)
Total Bilirubin: 1.9 mg/dL — ABNORMAL HIGH (ref 0.3–1.2)
Total Protein: 7.4 g/dL (ref 6.5–8.1)

## 2021-05-15 LAB — CBC WITH DIFFERENTIAL/PLATELET
Abs Immature Granulocytes: 0.02 10*3/uL (ref 0.00–0.07)
Basophils Absolute: 0.1 10*3/uL (ref 0.0–0.1)
Basophils Relative: 1 %
Eosinophils Absolute: 0 10*3/uL (ref 0.0–0.5)
Eosinophils Relative: 0 %
HCT: 43.4 % (ref 39.0–52.0)
Hemoglobin: 15.2 g/dL (ref 13.0–17.0)
Immature Granulocytes: 0 %
Lymphocytes Relative: 23 %
Lymphs Abs: 2 10*3/uL (ref 0.7–4.0)
MCH: 34.3 pg — ABNORMAL HIGH (ref 26.0–34.0)
MCHC: 35 g/dL (ref 30.0–36.0)
MCV: 98 fL (ref 80.0–100.0)
Monocytes Absolute: 0.7 10*3/uL (ref 0.1–1.0)
Monocytes Relative: 8 %
Neutro Abs: 5.7 10*3/uL (ref 1.7–7.7)
Neutrophils Relative %: 68 %
Platelets: 179 10*3/uL (ref 150–400)
RBC: 4.43 MIL/uL (ref 4.22–5.81)
RDW: 12.6 % (ref 11.5–15.5)
WBC: 8.4 10*3/uL (ref 4.0–10.5)
nRBC: 0 % (ref 0.0–0.2)

## 2021-05-15 LAB — MAGNESIUM: Magnesium: 2 mg/dL (ref 1.7–2.4)

## 2021-05-15 LAB — TROPONIN I (HIGH SENSITIVITY): Troponin I (High Sensitivity): 3 ng/L (ref <18)

## 2021-05-15 MED ORDER — METOPROLOL TARTRATE 25 MG PO TABS
25.0000 mg | ORAL_TABLET | Freq: Once | ORAL | Status: AC
  Administered 2021-05-15: 25 mg via ORAL
  Filled 2021-05-15: qty 1

## 2021-05-15 NOTE — ED Triage Notes (Addendum)
Pt c/o HA, nausea, "not feeling well" x 3 week-weakness to legs, light headed x 4-5 days-numbness to face last week-reports new med sinemet x 2 weeks for tremor that he was on then off due to side effect-states feels feels worse since stopping med -BP was elevated yesterday-was advised by PCP to come to ED-NAD-to triage in w/c

## 2021-05-15 NOTE — ED Provider Notes (Signed)
[facility] EMERGENCY DEPARTMENT Provider Note   CSN: [id number] Arrival date & time: 05/15/21  1357     History  Chief Complaint  Patient presents with   Multiple c/o    Todd Chan is a 76 y.o. male.  This is a 76 y.o. male  with significant medical history as below, including arthritis, hypertension, PVD, essential tremor who presents to the ED with complaint of multiple complaints.  Patient reports symptom onset approximately 3 to 4 weeks ago, including arthralgias, lightheadedness upon standing, bilateral knee pain.  Patient was recent started on Sinemet 2 weeks ago and discontinued himself.  Patient called primary care doctor earlier today and was told to come to the emergency department because his blood pressure was mildly elevated.  No chest pain, dyspnea, nausea or vomiting.  No fevers or chills.  No falls or head injury.  No Change in bowel or bladder function.   Past Medical History: No date: Arthritis No date: GERD (gastroesophageal reflux disease) No date: Hypertension No date: Osteoarthritis of knee No date: Painful urination No date: Peripheral vascular disease ([place])     Comment:  bilateral edema   Past Surgical History: 04/2017: CARDIAC CATHETERIZATION No date: ESOPHAGOGASTRODUODENOSCOPY ENDOSCOPY     Comment:  8 days ago 12/2020: HAND CONTRACTURE RELEASE; Left 6-7 years ago: KNEE ARTHROSCOPY; Left 04/17/2017: LEFT HEART CATH AND CORONARY ANGIOGRAPHY; N/A     Comment:  Procedure: LEFT HEART CATH AND CORONARY ANGIOGRAPHY;                Surgeon: Troy Sine, MD;  Location: [facility];  Service: Cardiovascular;  Laterality: N/A; 08/30/2020: RADIOLOGY WITH ANESTHESIA; N/A     Comment:  Procedure: MRI WITH ANESTHESIA BRAIN WITH AND WITHOUT               AND MRI CERVICAL SPINE WITH AND WITHOUT;  Surgeon:               Radiologist, Medication, MD;  Location: [facility];  Service:               Radiology;  Laterality:  N/A; 10/08/2016: REPLACEMENT TOTAL KNEE; Left No date: TONSILLECTOMY 10/08/2016: TOTAL KNEE ARTHROPLASTY; Left     Comment:  Procedure: LEFT TOTAL KNEE ARTHROPLASTY;  Surgeon:               Gaynelle Arabian, MD;  Location: [facility];  Service:               Orthopedics;  Laterality: Left; 06/17/2017: TOTAL KNEE ARTHROPLASTY; Right     Comment:  Procedure: RIGHT TOTAL KNEE ARTHROPLASTY;  Surgeon:               Gaynelle Arabian, MD;  Location: [facility];  Service:               Orthopedics;  Laterality: Right;    The history is provided by the patient. No language interpreter was used.      Home Medications Prior to Admission medications   Medication Sig Start Date End Date Taking? Authorizing Provider  ASPIRIN 81 PO Take 81 mg by mouth daily.    [provider]  carbidopa-levodopa (SINEMET IR) 25-100 MG tablet Take 0.5-1 tablets by mouth 3 (three) times daily. 04/20/21   Sater, Nanine Means, MD  Cholecalciferol (VITAMIN D3) 50 MCG (2000 UT) capsule Take 2,000 Units by mouth daily.  [provider]  Coenzyme Q10 (COQ10 PO) Take 100 mg by mouth daily.    [provider]  latanoprost (XALATAN) 0.005 % ophthalmic solution Place 1 drop into the left eye daily. 05/09/21 05/09/22  Rankin, Clent Demark, MD  metoprolol tartrate (LOPRESSOR) 25 MG tablet TAKE 1 TABLET TWICE A DAY 01/03/21   Richardo Priest, MD  Multiple Vitamin (MULTIVITAMIN) capsule Take 1 capsule by mouth daily.    [provider]  Omega-3 Fatty Acids (FISH OIL PO) Take 1,290 mg by mouth daily.    [provider]  pravastatin (PRAVACHOL) 20 MG tablet Take 1 tablet (20 mg total) by mouth daily. 04/13/21   Richardo Priest, MD  telmisartan-hydrochlorothiazide (MICARDIS HCT) 40-12.5 MG tablet TAKE 1 TABLET DAILY 01/03/21   Richardo Priest, MD      Allergies    Patient has no known allergies.    Review of Systems   Review of Systems  Constitutional:  Positive for fatigue. Negative for chills and fever.   HENT:  Negative for ear pain and sore throat.   Eyes:  Negative for pain and visual disturbance.  Respiratory:  Negative for cough and shortness of breath.   Cardiovascular:  Negative for chest pain and palpitations.  Gastrointestinal:  Negative for abdominal pain, nausea and vomiting.  Genitourinary:  Negative for dysuria and hematuria.  Musculoskeletal:  Positive for arthralgias and back pain.  Skin:  Negative for color change and rash.  Neurological:  Positive for tremors and light-headedness. Negative for seizures and syncope.  All other systems reviewed and are negative.  Physical Exam Updated Vital Signs BP (!) 156/104    Pulse 85    Temp 98.1 F (36.7 C) (Oral)    Resp 14    SpO2 95%  Physical Exam Vitals and nursing note reviewed.  Constitutional:      General: He is not in acute distress.    Appearance: Normal appearance. He is well-developed.  HENT:     Head: Normocephalic and atraumatic.     Right Ear: External ear normal.     Left Ear: External ear normal.     Mouth/Throat:     Mouth: Mucous membranes are moist.  Eyes:     General: No scleral icterus.    Extraocular Movements: Extraocular movements intact.     Pupils: Pupils are equal, round, and reactive to light.  Cardiovascular:     Rate and Rhythm: Normal rate and regular rhythm.     Pulses: Normal pulses.     Heart sounds: Normal heart sounds.  Pulmonary:     Effort: Pulmonary effort is normal. No respiratory distress.     Breath sounds: Normal breath sounds.  Abdominal:     General: Abdomen is flat.     Palpations: Abdomen is soft.     Tenderness: There is no abdominal tenderness.  Musculoskeletal:        General: Normal range of motion.     Cervical back: Normal range of motion.     Right lower leg: No edema.     Left lower leg: No edema.     Comments: No midline spinous process palpation percussion, no crepitus or step-off.  Full  Range of motion of neck without discomfort.  Skin:    General: Skin  is warm and dry.     Capillary Refill: Capillary refill takes less than 2 seconds.  Neurological:     Mental Status: He is alert and oriented to person, place, and time.  GCS: GCS eye subscore is 4. GCS verbal subscore is 5. GCS motor subscore is 6.     Cranial Nerves: Cranial nerves 2-12 are intact. No dysarthria or facial asymmetry.     Sensory: Sensation is intact.     Motor: Tremor present.     Coordination: Coordination is intact.     Gait: Gait is intact.  Psychiatric:        Mood and Affect: Mood normal.        Behavior: Behavior normal.    ED Results / Procedures / Treatments   Labs (all labs ordered are listed, but only abnormal results are displayed) Labs Reviewed  CBC WITH DIFFERENTIAL/PLATELET - Abnormal; Notable for the following components:      Result Value   MCH 34.3 (*)    All other components within normal limits  COMPREHENSIVE METABOLIC PANEL - Abnormal; Notable for the following components:   Sodium 134 (*)    Chloride 96 (*)    Glucose, Bld 113 (*)    Alkaline Phosphatase 29 (*)    Total Bilirubin 1.9 (*)    All other components within normal limits  MAGNESIUM  TROPONIN I (HIGH SENSITIVITY)  TROPONIN I (HIGH SENSITIVITY)    EKG EKG Interpretation  Date/Time:  Monday May 15 2021 14:20:15 EST Ventricular Rate:  98 PR Interval:  168 QRS Duration: 96 QT Interval:  358 QTC Calculation: 457 R Axis:   10 Text Interpretation: Normal sinus rhythm Normal ECG When compared with ECG of 05-Apr-2018 17:11, PREVIOUS ECG IS PRESENT Interpretation limited secondary to artifact Confirmed by Wynona Dove ([id number]) on 05/15/2021 7:46:41 PM  Radiology DG Chest 2 View  Result Date: 05/15/2021 CLINICAL DATA:  Headache nausea EXAM: CHEST - 2 VIEW COMPARISON:  05/15/2021, chest x-ray 04/05/2018, 03/08/2017, chest CT 06/28/2020 FINDINGS: Elevated right diaphragm. An ovoid right low paratracheal opacity appears to correspond to a bulky osteophyte on prior imaging.  Linear atelectasis left base. Stable cardiac size. IMPRESSION: No active cardiopulmonary disease. Electronically Signed   By: Donavan Foil M.D.   On: 05/15/2021 17:44   DG Chest Portable 1 View  Result Date: 05/15/2021 CLINICAL DATA:  Headache, nausea for 3 weeks, lightheadedness, fatigue EXAM: PORTABLE CHEST 1 VIEW COMPARISON:  04/05/2018 FINDINGS: Single frontal view of the chest demonstrates a stable cardiac silhouette. A rounded density in the right paratracheal region likely reflects a distended azygous vein. Increased density right infrahilar region likely reflects prominent vascular shadow. No airspace disease, effusion, or pneumothorax. Chronic elevation the right hemidiaphragm. No acute bony abnormalities. IMPRESSION: 1. Increased density in the right paratracheal and right infrahilar regions, felt to be prominent vascular shadows. Follow-up two-view chest x-ray in the radiology department may be useful when clinical situation permits. 2. Chronic elevation of the right hemidiaphragm. Electronically Signed   By: Randa Ngo M.D.   On: 05/15/2021 16:52    Procedures Procedures    Medications Ordered in ED Medications  metoprolol tartrate (LOPRESSOR) tablet 25 mg (25 mg Oral Given 05/15/21 1741)    ED Course/ Medical Decision Making/ A&P                           Medical Decision Making Amount and/or Complexity of Data Reviewed Labs: ordered. Radiology: ordered.  Risk Prescription drug management.    CC: multiple complaints   This patient presents to the Emergency Department for the above complaint. This involves an extensive number of treatment options and is a complaint that  carries with it a high risk of complications and morbidity. Vital signs were reviewed. Serious etiologies considered.  Record review:  Previous records obtained and reviewed spouse  Medical and surgical history as noted above.   Work up as above, notable for:  Labs & imaging results that were  available during my care of the patient were reviewed by me and considered in my medical decision making.   I ordered imaging studies which included CXR and I reviewed imaging which showed no acute process on 2 view.   Cardiac monitoring reviewed and interpreted personally which shows NSR, artifact present yet patient is tremulous  Social determinants of health include - N/a  Management: Patient given home blood pressure medication  Reassessment:  Patient does report feeling better.  He is ambulatory with steady gait.  EKG without evidence of acute ischemia although there is significant artifact present.  No STEMI.  Initial Trope negative, he does not want to stay for delta troponin.  He has no ongoing chest pain or dyspnea.  Low suspicion for ACS. Labs appear stable.  Rpt neurologic exam is nonfocal.  Patient does have some mild lightheadedness when he stands quickly. Not a/w chest pain, numbness or neck pain. Resolves when he sits down or moves more slowly. No syncope.  Unclear etiology of patient's symptoms today but given their chronic do not believe there is an acute emergency etiology.   There was facial numbness noted in triage note however patient denies this.  Low suspicion for CVA or acute intracranial abnormality although this was considered  Symptoms could be associated with recent Sinemet usage.   Advised patient follow-up with his PCP/cardiology regarding chronic complaints.   The patient improved significantly and was discharged in stable condition. Detailed discussions were had with the patient regarding current findings, and need for close f/u with PCP or on call doctor. The patient has been instructed to return immediately if the symptoms worsen in any way for re-evaluation. Patient verbalized understanding and is in agreement with current care plan. All questions answered prior to discharge.      This chart was dictated using voice recognition software.  Despite  best efforts to proofread,  errors can occur which can change the documentation meaning.         Final Clinical Impression(s) / ED Diagnoses Final diagnoses:  Elevated blood pressure reading  Malaise    Rx / DC Orders ED Discharge Orders     None         Jeanell Sparrow, DO 05/15/21 1953

## 2021-05-15 NOTE — Discharge Instructions (Signed)
It was a pleasure caring for you today in the emergency department. ° °Please return to the emergency department for any worsening or worrisome symptoms. ° ° °

## 2021-05-16 ENCOUNTER — Encounter (INDEPENDENT_AMBULATORY_CARE_PROVIDER_SITE_OTHER): Payer: Self-pay | Admitting: Ophthalmology

## 2021-05-17 ENCOUNTER — Ambulatory Visit (INDEPENDENT_AMBULATORY_CARE_PROVIDER_SITE_OTHER): Payer: Medicare Other | Admitting: Neurology

## 2021-05-17 ENCOUNTER — Encounter: Payer: Self-pay | Admitting: Neurology

## 2021-05-17 VITALS — BP 135/88 | HR 72 | Ht 72.0 in | Wt 279.0 lb

## 2021-05-17 DIAGNOSIS — G629 Polyneuropathy, unspecified: Secondary | ICD-10-CM | POA: Diagnosis not present

## 2021-05-17 DIAGNOSIS — R251 Tremor, unspecified: Secondary | ICD-10-CM

## 2021-05-17 DIAGNOSIS — M47812 Spondylosis without myelopathy or radiculopathy, cervical region: Secondary | ICD-10-CM | POA: Diagnosis not present

## 2021-05-17 DIAGNOSIS — G4489 Other headache syndrome: Secondary | ICD-10-CM

## 2021-05-17 DIAGNOSIS — R42 Dizziness and giddiness: Secondary | ICD-10-CM | POA: Diagnosis not present

## 2021-05-17 MED ORDER — PYRIDOSTIGMINE BROMIDE 60 MG PO TABS: ORAL_TABLET | ORAL | 5 refills | Status: DC

## 2021-05-17 NOTE — Progress Notes (Signed)
[facility]  PATIENT: Todd Chan DOB: [birth year]  REFERRING DOCTOR OR PCP:  Crissie Sickles, DO SOURCE: patient, notes from Dr. Jaynee Eagles and PCP  _________________________________   HISTORICAL  CHIEF COMPLAINT:  Chief Complaint  Patient presents with   Follow-up    Rm 2, alone. Here for furter eval for worsening in sx. Pt has weakness in leg that comes and goes. Pt was seen at the ER 2 days ago, everything came back normal, BP was high.     HISTORY OF PRESENT ILLNESS:  Todd Chan is a 76 y.o. man with spells of lightheadedness, nausea, tremors and neuropathy.    Update 05/17/2021: He went to the ED due to feeling worse with more nausea and lightheadedness and leg weakness.      He also was having headache.   He still has some occipitla headache on the right seems to start in his neck.      BP ws 180/101 and has been in that range some over last couple weeks but was fine 3 weeks ago at last PCP visit.  They also did orthostatics  Hs lightheadedness is worse when he stands up - this is worse if sitting a while.   He also feels more lightheaded after he bends over (to tie shoes).   This usually occurs for a minute or 2 and may also occur if he is standing a while.  He feels pressure in his ears while this is occurring.    We had tried metoclopramide for his nausea without benefit.      Headaches occur frequently, currently about 5 days a week beginning in the morning.    These are often associated with nausea.   Pain is left > right most of the time but is greater on the right today..   Neck is sometimes stiff and there is upper neck pain/occipital pain.   Most of the time the headaches only last for an hour or so.  Nausea this morning lasted 20 minutes - which is typical.  Ginger Ale helps.  If he wakes up without a headache, there is no nausea.   Ondansetron had not helped.   Tylenol has not helped much.    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    He continues to have a left sided tremor.   Unclear if essential vs. Parkinsonian. Primidone was tried but he had headaches and lightheaded and stopped.  It did not seem to help noticeably.    Ativan had npt helped.  He is also on metoprolol 25 mg po bid (sees cardiology for an aortic tear) but does not feel it has helped any,      A trial of Sinemet did not help.  He is claustophobic and prefers not to do a DaTScan.    CT head was normal for age.   CT cervical showed some DJD with moderate foraminal narrowing at C3C4 but no severe changes.    MRI of the brain 5/312023 was normal for age - mild atrophy and small vessel changes.  Nothing acute.    MRI of the cervical spine 08/30/2021 showed a normal spinal cord.  At [facility] he has mild spinal stenosis due to disc bulging and spondylosis.  Also foraminal narrowing.  A few small bulges elsewhere without sinal stenosis.    Bilateral carotid ultrasound showed less than 50% stenosis in the carotid arteries around the bulbs bilaterally.  The vertebral  artery system had antegrade flow bilaterally.  Of note, BP was 188/97 at the time.  An x-ray of the cervical spine showed mild retrolisthesis of C3 upon C4 due to spondylosis.  There is foraminal narrowing at this level bilaterally.  Lesser foraminal narrowing is noted at C6-C7 due to degenerative changes.     TREMOR AND NEUROPATHY HISTORY: He has had progressively worsening tremor since 2016 on his left side only.  Initially, only the hand was involved and he noted worsening handwriting.   He noted the tremor in his leg a year or two later.    Tremor is worse if with intention and is a little worse if more tense.   He has noted some improvement with relaxing the left shoulder/back. He has been on metoprolol for a few years and did not note any improvement when he started.   Scotch helps some.      He has had numbness in his feet for several years and saw Dr. Jaynee Eagles once in  2019.  He has right > left leg edema.    He feels the numbness and foot/ankle/lower leg swelling have both worsened.   In 2019, SPEP/IEF, SSA/SSB, RF, thiamine, ESR were fine.   He is on metoprolol 25 mg po bid for his heart.   Primidone was tried June 2021.    REVIEW OF SYSTEMS: Constitutional: No fevers, chills, sweats, or change in appetite Eyes: No visual changes, double vision, eye pain Ear, nose and throat: No hearing loss, ear pain, nasal congestion, sore throat Cardiovascular: No chest pain, palpitations Respiratory:  No shortness of breath at rest or with exertion.   No wheezes GastrointestinaI: No nausea, vomiting, diarrhea, abdominal pain, fecal incontinence Genitourinary:  No dysuria, urinary retention or frequency.  No nocturia. Musculoskeletal:  No neck pain, back pain Integumentary: No rash, pruritus, skin lesions Neurological: as above Psychiatric: No depression at this time.  No anxiety Endocrine: No palpitations, diaphoresis, change in appetite, change in weigh or increased thirst Hematologic/Lymphatic:  No anemia, purpura, petechiae. Allergic/Immunologic: No itchy/runny eyes, nasal congestion, recent allergic reactions, rashes  ALLERGIES: No Known Allergies  HOME MEDICATIONS:  Current Outpatient Medications:    ASPIRIN 81 PO, Take 81 mg by mouth daily., Disp: , Rfl:    Cholecalciferol (VITAMIN D3) 50 MCG (2000 UT) capsule, Take 2,000 Units by mouth daily., Disp: , Rfl:    Coenzyme Q10 (COQ10 PO), Take 100 mg by mouth daily., Disp: , Rfl:    latanoprost (XALATAN) 0.005 % ophthalmic solution, Place 1 drop into the left eye daily., Disp: 2.5 mL, Rfl: 3   metoprolol tartrate (LOPRESSOR) 25 MG tablet, TAKE 1 TABLET TWICE A DAY, Disp: 180 tablet, Rfl: 3   Multiple Vitamin (MULTIVITAMIN) capsule, Take 1 capsule by mouth daily., Disp: , Rfl:    Omega-3 Fatty Acids (FISH OIL PO), Take 1,290 mg by mouth daily., Disp: , Rfl:    pravastatin (PRAVACHOL) 20 MG tablet, Take 1  tablet (20 mg total) by mouth daily., Disp: 90 tablet, Rfl: 0   pyridostigmine (MESTINON) 60 MG tablet, 1/2 to 1 po tid, Disp: 90 tablet, Rfl: 5   telmisartan-hydrochlorothiazide (MICARDIS HCT) 40-12.5 MG tablet, TAKE 1 TABLET DAILY, Disp: 90 tablet, Rfl: 3  PAST MEDICAL HISTORY: Past Medical History:  Diagnosis Date   Arthritis    GERD (gastroesophageal reflux disease)    Hypertension    Osteoarthritis of knee    Painful urination    Peripheral vascular disease (HCC)    bilateral edema  PAST SURGICAL HISTORY: Past Surgical History:  Procedure Laterality Date   CARDIAC CATHETERIZATION  04/2017   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   KNEE ARTHROSCOPY Left 6-7 years ago   LEFT HEART CATH AND CORONARY ANGIOGRAPHY N/A 04/17/2017   Procedure: LEFT HEART CATH AND CORONARY ANGIOGRAPHY;  Surgeon: Troy Sine, MD;  Location: [facility];  Service: Cardiovascular;  Laterality: N/A;   RADIOLOGY WITH ANESTHESIA N/A 08/30/2020   Procedure: MRI WITH ANESTHESIA BRAIN WITH AND WITHOUT AND MRI CERVICAL SPINE WITH AND WITHOUT;  Surgeon: Radiologist, Medication, MD;  Location: [facility];  Service: Radiology;  Laterality: N/A;   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   TONSILLECTOMY     TOTAL KNEE ARTHROPLASTY Left 10/08/2016   Procedure: LEFT TOTAL KNEE ARTHROPLASTY;  Surgeon: Gaynelle Arabian, MD;  Location: [facility];  Service: Orthopedics;  Laterality: Left;   TOTAL KNEE ARTHROPLASTY Right 06/17/2017   Procedure: RIGHT TOTAL KNEE ARTHROPLASTY;  Surgeon: Gaynelle Arabian, MD;  Location: [facility];  Service: Orthopedics;  Laterality: Right;    FAMILY HISTORY: Family History  Problem Relation Age of Onset   Breast cancer Mother 31   Prostate cancer Father    Breast cancer Sister 53   Neuropathy Neg Hx     SOCIAL HISTORY:  Social History   Socioeconomic History   Marital status: Married    Spouse name: Not on file   Number of  children: 2   Years of education: Not on file   Highest education level: Bachelor's degree (e.g., BA, AB, BS)  Occupational History   Not on file  Tobacco Use   Smoking status: Former    Packs/day: 1.00    Years: 20.00    Pack years: 20.00    Types: Cigarettes    Quit date: 10/1987    Years since quitting: 33.6   Smokeless tobacco: Never   Tobacco comments:    quit 30 yeara ago   Vaping Use   Vaping Use: Never used  Substance and Sexual Activity   Alcohol use: Yes    Alcohol/week: 7.0 standard drinks    Types: 7 Glasses of wine per week    Comment: daily   Drug use: No   Sexual activity: Yes  Other Topics Concern   Not on file  Social History Narrative   Lives at home with his wife   Left handed   Drinks 2-3 cups of caffeine daily (coffee)   Social Determinants of Health   Financial Resource Strain: Not on file  Food Insecurity: Not on file  Transportation Needs: Not on file  Physical Activity: Not on file  Stress: Not on file  Social Connections: Not on file  Intimate Partner Violence: Not on file     PHYSICAL EXAM  Vitals:   05/17/21 0852  BP: 135/88  Pulse: 72  Weight: 279 lb (126.6 kg)  Height: 6' (1.829 m)    Body mass index is 37.84 kg/m.  ORTHOSTATIC VITALS: SUPINE    138/80   72 bpm SITTING    135/80   76 Stand (30s)   125/75    76 Stand (85m   120/75  80    General: The patient is well-developed and well-nourished and in no acute distress  HEENT:  Head is [place]/AT.  Sclera are anicteric.  Funduscopic exam shows normal optic discs and retinal vessels.  Neck: No carotid bruits are noted.  The neck is nontender.  Cardiovascular: The heart has a regular rate and rhythm with a normal S1 and S2. There were no murmurs, gallops or rubs.    Skin: Extremities are without rash or  edema.  Neurologic Exam  Mental status:  The patient is alert and oriented x 3 at the time of the examination. The patient has apparent normal recent and remote  memory, with an apparently normal attention span and concentration ability.   Speech is normal.  Cranial nerves: Extraocular movements are full.  Facial strength and sensation was normal.   No dysarthria is noted. No obvious hearing deficits are noted.  Motor: He does not have bradykinesia.  He has a rapid tremor in the left hand that is worse with intention but present at rest.  There is some distraction.  There is no tremor in the right arm.  There is a mild tremor in the left.  Muscle bulk is normal.   Tone is normal. Strength is  5 / 5 in all 4 extremities except 4+/5 EHL strength in the feet.   Sensory: Sensory testing is intact to pinprick, soft touch and vibration sensation in the arms but reduced pinprick and vibration in the foot.  Vibration sense fairy normal at ankle (10% great toe and 50% distal foot).  Coordination: Cerebellar testing reveals good finger-nose-finger and heel-to-shin bilaterally.  Gait and station: Station is normal.  Gait and tandem gait are normal for age.  There was no retropulsion.  Romberg is negative.   Reflexes: Deep tendon reflexes are symmetric and normal bilaterally.     DIAGNOSTIC DATA (LABS, IMAGING, TESTING) - I reviewed patient records, labs, notes, testing and imaging myself where available.  Lab Results  Component Value Date   WBC 8.4 05/15/2021   HGB 15.2 05/15/2021   HCT 43.4 05/15/2021   MCV 98.0 05/15/2021   PLT 179 05/15/2021      Component Value Date/Time   NA 134 (L) 05/15/2021 1625   NA 140 07/11/2020 0929   K 3.8 05/15/2021 1625   CL 96 (L) 05/15/2021 1625   CO2 28 05/15/2021 1625   GLUCOSE 113 (H) 05/15/2021 1625   BUN 13 05/15/2021 1625   BUN 15 07/11/2020 0929   CREATININE 0.88 05/15/2021 1625   CALCIUM 9.2 05/15/2021 1625   PROT 7.4 05/15/2021 1625   PROT 7.1 07/11/2020 0929   ALBUMIN 4.2 05/15/2021 1625   ALBUMIN 4.5 07/11/2020 0929   AST 26 05/15/2021 1625   ALT 22 05/15/2021 1625   ALKPHOS 29 (L) 05/15/2021 1625    BILITOT 1.9 (H) 05/15/2021 1625   BILITOT 1.2 07/11/2020 0929   GFRNONAA >60 05/15/2021 1625   GFRAA 92 05/28/2019 1113   Lab Results  Component Value Date   CHOL 143 07/11/2020   HDL 51 07/11/2020   LDLCALC 78 07/11/2020   TRIG 73 07/11/2020   CHOLHDL 2.8 07/11/2020        ASSESSMENT AND PLAN  Tremor  Episodic lightheadedness  Polyneuropathy  Other headache syndrome  Cervical spondylosis  1.  The etiology of the lightheadedness is uncertain.  He did have mild reduced blood pressure upon standing though not enough to be certain of a diagnosis of orthostatic hypotension.  Because this is his most troubling symptom, I will have him try Mestinon 30 mg 3 times daily and titrate up to 60 mg p.o. 3 times daily.  If this is not helpful, we could consider checking a tilt table  2.  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  Unfortunately, it has not responded to medications.  He also did not respond to Sinemet.  We have discussed that if it worsens we could refer for evaluation for a deep brain stimulator.  3.   He does have degenerative changes at C3-C4 that could contribute to occipital pain and headache.   4.   He will return in 6 months but call sooner if he has new or worsening symptoms.  42-minute office visit with the majority of the time spent face-to-face for history and physical, discussion/counseling and decision-making.  Additional time with record review and documentation.   Jemiah Cuadra A. Felecia Shelling, MD, [facility] 0/97/9499, 7:18 PM Certified in Neurology, Clinical Neurophysiology, Sleep Medicine and Neuroimaging  [facility] [address], [place], [place] [address] [phone]

## 2021-05-18 ENCOUNTER — Other Ambulatory Visit: Payer: Self-pay

## 2021-05-18 ENCOUNTER — Encounter: Payer: Self-pay | Admitting: Cardiology

## 2021-05-18 DIAGNOSIS — I7121 Aneurysm of the ascending aorta, without rupture: Secondary | ICD-10-CM

## 2021-05-19 DIAGNOSIS — R519 Headache, unspecified: Secondary | ICD-10-CM | POA: Diagnosis not present

## 2021-05-19 DIAGNOSIS — M47812 Spondylosis without myelopathy or radiculopathy, cervical region: Secondary | ICD-10-CM | POA: Diagnosis not present

## 2021-05-19 DIAGNOSIS — M542 Cervicalgia: Secondary | ICD-10-CM | POA: Diagnosis not present

## 2021-05-23 DIAGNOSIS — M542 Cervicalgia: Secondary | ICD-10-CM | POA: Diagnosis not present

## 2021-05-23 DIAGNOSIS — M47812 Spondylosis without myelopathy or radiculopathy, cervical region: Secondary | ICD-10-CM | POA: Diagnosis not present

## 2021-05-23 DIAGNOSIS — R519 Headache, unspecified: Secondary | ICD-10-CM | POA: Diagnosis not present

## 2021-05-24 ENCOUNTER — Encounter: Payer: Self-pay | Admitting: Cardiology

## 2021-05-30 ENCOUNTER — Encounter: Payer: Self-pay | Admitting: Neurology

## 2021-06-01 DIAGNOSIS — H2513 Age-related nuclear cataract, bilateral: Secondary | ICD-10-CM | POA: Diagnosis not present

## 2021-06-01 DIAGNOSIS — H40053 Ocular hypertension, bilateral: Secondary | ICD-10-CM | POA: Diagnosis not present

## 2021-06-01 DIAGNOSIS — H34832 Tributary (branch) retinal vein occlusion, left eye, with macular edema: Secondary | ICD-10-CM | POA: Diagnosis not present

## 2021-06-02 DIAGNOSIS — M542 Cervicalgia: Secondary | ICD-10-CM | POA: Diagnosis not present

## 2021-06-02 DIAGNOSIS — M47812 Spondylosis without myelopathy or radiculopathy, cervical region: Secondary | ICD-10-CM | POA: Diagnosis not present

## 2021-06-02 DIAGNOSIS — R519 Headache, unspecified: Secondary | ICD-10-CM | POA: Diagnosis not present

## 2021-06-09 ENCOUNTER — Other Ambulatory Visit: Payer: Self-pay | Admitting: Neurology

## 2021-06-09 DIAGNOSIS — G4489 Other headache syndrome: Secondary | ICD-10-CM

## 2021-06-09 DIAGNOSIS — M542 Cervicalgia: Secondary | ICD-10-CM

## 2021-06-12 ENCOUNTER — Other Ambulatory Visit: Payer: Self-pay | Admitting: *Deleted

## 2021-06-12 ENCOUNTER — Ambulatory Visit: Payer: No Typology Code available for payment source | Admitting: Neurology

## 2021-06-12 DIAGNOSIS — M542 Cervicalgia: Secondary | ICD-10-CM

## 2021-06-12 DIAGNOSIS — G4489 Other headache syndrome: Secondary | ICD-10-CM

## 2021-06-14 DIAGNOSIS — R17 Unspecified jaundice: Secondary | ICD-10-CM | POA: Diagnosis not present

## 2021-06-14 DIAGNOSIS — R519 Headache, unspecified: Secondary | ICD-10-CM | POA: Diagnosis not present

## 2021-06-14 DIAGNOSIS — R110 Nausea: Secondary | ICD-10-CM | POA: Diagnosis not present

## 2021-06-14 DIAGNOSIS — I10 Essential (primary) hypertension: Secondary | ICD-10-CM | POA: Diagnosis not present

## 2021-06-14 DIAGNOSIS — R2689 Other abnormalities of gait and mobility: Secondary | ICD-10-CM | POA: Diagnosis not present

## 2021-06-14 DIAGNOSIS — G250 Essential tremor: Secondary | ICD-10-CM | POA: Diagnosis not present

## 2021-06-14 DIAGNOSIS — M542 Cervicalgia: Secondary | ICD-10-CM | POA: Diagnosis not present

## 2021-06-16 DIAGNOSIS — M47812 Spondylosis without myelopathy or radiculopathy, cervical region: Secondary | ICD-10-CM | POA: Diagnosis not present

## 2021-06-16 DIAGNOSIS — M542 Cervicalgia: Secondary | ICD-10-CM | POA: Diagnosis not present

## 2021-06-16 DIAGNOSIS — R519 Headache, unspecified: Secondary | ICD-10-CM | POA: Diagnosis not present

## 2021-06-19 DIAGNOSIS — R110 Nausea: Secondary | ICD-10-CM | POA: Diagnosis not present

## 2021-06-19 DIAGNOSIS — R142 Eructation: Secondary | ICD-10-CM | POA: Diagnosis not present

## 2021-06-20 ENCOUNTER — Ambulatory Visit (HOSPITAL_BASED_OUTPATIENT_CLINIC_OR_DEPARTMENT_OTHER)
Admission: RE | Admit: 2021-06-20 | Discharge: 2021-06-20 | Disposition: A | Discharge status: Home / Self Care | Payer: Medicare Other | Source: Ambulatory Visit | Attending: Neurology | Admitting: Neurology

## 2021-06-20 ENCOUNTER — Ambulatory Visit (HOSPITAL_BASED_OUTPATIENT_CLINIC_OR_DEPARTMENT_OTHER)
Admission: RE | Admit: 2021-06-20 | Discharge: 2021-06-20 | Disposition: A | Discharge status: Home / Self Care | Payer: Medicare Other | Source: Ambulatory Visit | Attending: Cardiology | Admitting: Cardiology

## 2021-06-20 ENCOUNTER — Other Ambulatory Visit: Payer: Self-pay

## 2021-06-20 DIAGNOSIS — M542 Cervicalgia: Secondary | ICD-10-CM | POA: Diagnosis not present

## 2021-06-20 DIAGNOSIS — M4802 Spinal stenosis, cervical region: Secondary | ICD-10-CM | POA: Diagnosis not present

## 2021-06-20 DIAGNOSIS — G4489 Other headache syndrome: Secondary | ICD-10-CM

## 2021-06-20 DIAGNOSIS — I7121 Aneurysm of the ascending aorta, without rupture: Secondary | ICD-10-CM

## 2021-06-20 DIAGNOSIS — J984 Other disorders of lung: Secondary | ICD-10-CM | POA: Diagnosis not present

## 2021-06-20 DIAGNOSIS — M47812 Spondylosis without myelopathy or radiculopathy, cervical region: Secondary | ICD-10-CM | POA: Diagnosis not present

## 2021-06-20 DIAGNOSIS — R519 Headache, unspecified: Secondary | ICD-10-CM | POA: Diagnosis not present

## 2021-06-20 DIAGNOSIS — M50322 Other cervical disc degeneration at C5-C6 level: Secondary | ICD-10-CM | POA: Diagnosis not present

## 2021-06-20 DIAGNOSIS — R42 Dizziness and giddiness: Secondary | ICD-10-CM | POA: Diagnosis not present

## 2021-06-22 ENCOUNTER — Encounter: Payer: Self-pay | Admitting: Neurology

## 2021-06-23 DIAGNOSIS — M47812 Spondylosis without myelopathy or radiculopathy, cervical region: Secondary | ICD-10-CM | POA: Diagnosis not present

## 2021-06-23 DIAGNOSIS — M542 Cervicalgia: Secondary | ICD-10-CM | POA: Diagnosis not present

## 2021-06-23 DIAGNOSIS — R519 Headache, unspecified: Secondary | ICD-10-CM | POA: Diagnosis not present

## 2021-06-27 DIAGNOSIS — M542 Cervicalgia: Secondary | ICD-10-CM | POA: Diagnosis not present

## 2021-06-28 ENCOUNTER — Other Ambulatory Visit: Payer: Self-pay | Admitting: Cardiology

## 2021-06-28 ENCOUNTER — Other Ambulatory Visit (HOSPITAL_BASED_OUTPATIENT_CLINIC_OR_DEPARTMENT_OTHER): Payer: Medicare Other

## 2021-07-05 DIAGNOSIS — R2689 Other abnormalities of gait and mobility: Secondary | ICD-10-CM | POA: Diagnosis not present

## 2021-07-13 ENCOUNTER — Encounter: Payer: Self-pay | Admitting: Podiatry

## 2021-07-13 ENCOUNTER — Ambulatory Visit (INDEPENDENT_AMBULATORY_CARE_PROVIDER_SITE_OTHER): Payer: Medicare Other | Admitting: Podiatry

## 2021-07-13 DIAGNOSIS — I872 Venous insufficiency (chronic) (peripheral): Secondary | ICD-10-CM

## 2021-07-13 DIAGNOSIS — K2270 Barrett's esophagus without dysplasia: Secondary | ICD-10-CM | POA: Insufficient documentation

## 2021-07-13 DIAGNOSIS — G619 Inflammatory polyneuropathy, unspecified: Secondary | ICD-10-CM | POA: Insufficient documentation

## 2021-07-13 DIAGNOSIS — M79675 Pain in left toe(s): Secondary | ICD-10-CM | POA: Diagnosis not present

## 2021-07-13 DIAGNOSIS — B351 Tinea unguium: Secondary | ICD-10-CM

## 2021-07-13 DIAGNOSIS — M79674 Pain in right toe(s): Secondary | ICD-10-CM

## 2021-07-13 DIAGNOSIS — N401 Enlarged prostate with lower urinary tract symptoms: Secondary | ICD-10-CM | POA: Insufficient documentation

## 2021-07-13 DIAGNOSIS — G609 Hereditary and idiopathic neuropathy, unspecified: Secondary | ICD-10-CM | POA: Diagnosis not present

## 2021-07-13 DIAGNOSIS — K8020 Calculus of gallbladder without cholecystitis without obstruction: Secondary | ICD-10-CM | POA: Insufficient documentation

## 2021-07-13 DIAGNOSIS — Z860100 Personal history of colon polyps, unspecified: Secondary | ICD-10-CM | POA: Insufficient documentation

## 2021-07-13 DIAGNOSIS — J302 Other seasonal allergic rhinitis: Secondary | ICD-10-CM | POA: Insufficient documentation

## 2021-07-13 DIAGNOSIS — K2100 Gastro-esophageal reflux disease with esophagitis, without bleeding: Secondary | ICD-10-CM | POA: Insufficient documentation

## 2021-07-13 DIAGNOSIS — M720 Palmar fascial fibromatosis [Dupuytren]: Secondary | ICD-10-CM | POA: Insufficient documentation

## 2021-07-13 DIAGNOSIS — G250 Essential tremor: Secondary | ICD-10-CM | POA: Insufficient documentation

## 2021-07-13 NOTE — Progress Notes (Signed)
?  Subjective:  ?Patient ID: Todd Chan, male    DOB: Aug 30, 1945,   MRN: [id number] ? ?Chief Complaint  ?Patient presents with  ? Nail Problem  ?  Trim nails and I have a spot on the big toenail   ? ? ?76 y.o. male presents for concern of painful thickened and elongated toenails with a history of PVD and neuropathy. Denies diabetes. Hoping to have nails trimmed as well.  . Denies any other pedal complaints. Denies n/v/f/c.  ? ?PCP: Crissie Sickles DO  ? ?Past Medical History:  ?Diagnosis Date  ? Arthritis   ? GERD (gastroesophageal reflux disease)   ? Hypertension   ? Osteoarthritis of knee   ? Painful urination   ? Peripheral vascular disease ([place])   ? bilateral edema   ? ? ?Objective:  ?Physical Exam: ?Vascular: DP/PT pulses 1/4 bilateral. CFT <3 seconds. Normal hair growth on digits. No edema.  ?Skin. No lacerations or abrasions bilateral feet. Nails 1-5 are thickened discolored and elongated with subungual debris.  ?Musculoskeletal: MMT 5/5 bilateral lower extremities in DF, PF, Inversion and Eversion. Deceased ROM in DF of ankle joint. No pain to palpation.  ?Neurological: Sensation diminished to light touch. Protective sensation present to all 10 locations with SWMF.  ? ?Assessment:  ? ?1. Idiopathic peripheral neuropathy   ?2. Venous (peripheral) insufficiency   ?3. Pain due to onychomycosis of toenails of both feet   ? ? ? ? ?Plan:  ?Patient was evaluated and treated and all questions answered. ?Discussed neuropathy and etiology as well as treatment with patient.  ?-Discussed and educated patient on foot care, especially with  ?regards to the vascular, neurological and musculoskeletal systems.  ?-Discussed supportive shoes at all times and checking feet regularly.  ?-Nails 1-5 b/l debrided today without incident.  ?-Patient to return in 3 months for at risk foot care.  ? ? ?Lorenda Peck, DPM  ? ? ?

## 2021-07-17 NOTE — Progress Notes (Signed)
?Cardiology Office Note:   ? ?Date:  07/18/2021  ? ?ID:  Todd Chan, DOB 1945-10-14, MRN [id number] ? ?PCP:  Lyman Bishop, DO  ?Cardiologist:  Shirlee More, MD   ? ?Referring MD: Lyman Bishop, DO  ? ? ?ASSESSMENT:   ? ?1. Coronary artery disease of native artery of native heart with stable angina pectoris ([place])   ?2. Essential hypertension   ?3. Mixed hyperlipidemia   ?4. Chronic venous insufficiency   ?5. Enlarged thoracic aorta ([place])   ? ?PLAN:   ? ?In order of problems listed above: ? ?From a cardiology perspective he is doing well he has very mild nonobstructive CAD and is not having angina continue his medical treatment including aspirin beta-blocker and statin. ?Continue current antihypertensives ?Continue his statin ?No edema on exam today ?Very mild enlargement of his aorta consider repeat CT around the time of his next visit ?His predominant problems or neurologic GI and at the family's request referral to Dr. Carles Collet they want to know whether or not he has Parkinson's disease ? ? ?Next appointment: 9 months ? ? ?Medication Adjustments/Labs and Tests Ordered: ?Current medicines are reviewed at length with the patient today.  Concerns regarding medicines are outlined above.  ?Orders Placed This Encounter  ?Procedures  ? Ambulatory referral to Neurology  ? ?No orders of the defined types were placed in this encounter. ? ? ?Chief Complaint  ?Patient presents with  ? Follow-up  ? Coronary Artery Disease  ? ? ?History of Present Illness:   ? ?Todd Chan is a 76 y.o. male with a hx of mild CAD with 50% proximal LAD stenosis and normal left ventricular function at coronary angiography in 2019 hypertension mild enlargement ascending aorta stable at 41 mm 06/28/2020 last seen 07/11/2020. ? ? ?Compliance with diet, lifestyle and medications: He is seen along with his wife in the office.  Compliant with medications. ? ?Cardiology perspective doing well having no angina dyspnea chest pain palpitation  or syncope. ? ?He feels lightheaded unrelated to change in blood pressure or pulse ? ?He has difficulty with his gait his wife says he shuffles he has increased tremor which is now bilateral and upper lower extremity and an unsteady gait.  They request evaluation for Parkinson's disease. ?He has seen GI and has intermittent episodes of nausea and belching.  He is on a PPI he also has an elevated bilirubin. ?Past Medical History:  ?Diagnosis Date  ? Arthritis   ? GERD (gastroesophageal reflux disease)   ? Hypertension   ? Osteoarthritis of knee   ? Painful urination   ? Peripheral vascular disease ([place])   ? bilateral edema   ? ? ?Past Surgical History:  ?Procedure Laterality Date  ? CARDIAC CATHETERIZATION  04/2017  ? ESOPHAGOGASTRODUODENOSCOPY ENDOSCOPY    ? 8 days ago  ? 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 KNEE ARTHROSCOPY Left 6-7 years ago  ? LEFT HEART CATH AND CORONARY ANGIOGRAPHY N/A 04/17/2017  ? Procedure: LEFT HEART CATH AND CORONARY ANGIOGRAPHY;  Surgeon: Troy Sine, MD;  Location: [facility];  Service: Cardiovascular;  Laterality: N/A;  ? RADIOLOGY WITH ANESTHESIA N/A 08/30/2020  ? Procedure: MRI WITH ANESTHESIA BRAIN WITH AND WITHOUT AND MRI CERVICAL SPINE WITH AND WITHOUT;  Surgeon: Radiologist, Medication, MD;  Location: [facility];  Service: Radiology;  Laterality: N/A;  ? 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  ? TONSILLECTOMY    ? TOTAL KNEE ARTHROPLASTY Left 10/08/2016  ? Procedure: LEFT TOTAL  KNEE ARTHROPLASTY;  Surgeon: Gaynelle Arabian, MD;  Location: [facility];  Service: Orthopedics;  Laterality: Left;  ? TOTAL KNEE ARTHROPLASTY Right 06/17/2017  ? Procedure: RIGHT TOTAL KNEE ARTHROPLASTY;  Surgeon: Gaynelle Arabian, MD;  Location: [facility];  Service: Orthopedics;  Laterality: Right;  ? ? ?Current Medications: ?Current Meds  ?Medication Sig  ? albuterol (VENTOLIN HFA) 108 (90 Base) MCG/ACT inhaler 1 puff as needed  ? aspirin 81 MG EC tablet 1 tablet  ? Cholecalciferol (VITAMIN D3) 50 MCG  (2000 UT) capsule Take 2,000 Units by mouth daily.  ? Coenzyme Q10 (CO Q 10) 100 MG CAPS See admin instructions.  ? latanoprost (XALATAN) 0.005 % ophthalmic solution Place 1 drop into the left eye daily.  ? metoprolol tartrate (LOPRESSOR) 25 MG tablet TAKE 1 TABLET TWICE A DAY  ? Multiple Vitamin (MULTIVITAMIN) capsule Take 1 capsule by mouth daily.  ? Omega-3 Fatty Acids (FISH OIL PO) Take 1,290 mg by mouth daily.  ? pantoprazole (PROTONIX) 40 MG tablet 1 tablet  ? pravastatin (PRAVACHOL) 20 MG tablet TAKE 1 TABLET DAILY  ? telmisartan-hydrochlorothiazide (MICARDIS HCT) 40-12.5 MG tablet TAKE 1 TABLET DAILY  ?  ? ?Allergies:   Patient has no known allergies.  ? ?Social History  ? ?Socioeconomic History  ? Marital status: Married  ?  Spouse name: Not on file  ? Number of children: 2  ? Years of education: Not on file  ? Highest education level: Bachelor's degree (e.g., BA, AB, BS)  ?Occupational History  ? Not on file  ?Tobacco Use  ? Smoking status: Former  ?  Packs/day: 1.00  ?  Years: 20.00  ?  Pack years: 20.00  ?  Types: Cigarettes  ?  Quit date: 10/1987  ?  Years since quitting: 33.8  ? Smokeless tobacco: Never  ? Tobacco comments:  ?  quit 30 yeara ago   ?Vaping Use  ? Vaping Use: Never used  ?Substance and Sexual Activity  ? Alcohol use: Yes  ?  Alcohol/week: 7.0 standard drinks  ?  Types: 7 Glasses of wine per week  ?  Comment: daily  ? Drug use: No  ? Sexual activity: Yes  ?Other Topics Concern  ? Not on file  ?Social History Narrative  ? Lives at home with his wife  ? Left handed  ? Drinks 2-3 cups of caffeine daily (coffee)  ? ?Social Determinants of Health  ? ?Financial Resource Strain: Not on file  ?Food Insecurity: Not on file  ?Transportation Needs: Not on file  ?Physical Activity: Not on file  ?Stress: Not on file  ?Social Connections: Not on file  ?  ? ?Family History: ?The patient's family history includes Breast cancer (age of onset: 9) in his sister; Breast cancer (age of onset: 56) in his  mother; Prostate cancer in his father. There is no history of Neuropathy. ?ROS:   ?Please see the history of present illness.    ?All other systems reviewed and are negative. ? ?EKGs/Labs/Other Studies Reviewed:   ? ?The following studies were reviewed today: ? ? ?Recent Labs: ?05/15/2021: ALT 22; BUN 13; Creatinine, Ser 0.88; Hemoglobin 15.2; Magnesium 2.0; Platelets 179; Potassium 3.8; Sodium 134  ?Recent Lipid Panel ?   ?Component Value Date/Time  ? CHOL 143 07/11/2020 0929  ? TRIG 73 07/11/2020 0929  ? HDL 51 07/11/2020 0929  ? CHOLHDL 2.8 07/11/2020 0929  ? [place] 78 07/11/2020 0929  ? ? ?Physical Exam:   ? ?VS:  BP (!) 146/78  Pulse 61   Ht 6' (1.829 m)   Wt 274 lb (124.3 kg)   SpO2 97%   BMI 37.16 kg/m?    ? ?Wt Readings from Last 3 Encounters:  ?07/18/21 274 lb (124.3 kg)  ?05/17/21 279 lb (126.6 kg)  ?03/13/21 287 lb 3.2 oz (130.3 kg)  ?  ? ?GEN: Anxious well nourished, well developed in no acute distress ?HEENT: Normal ?NECK: No JVD; No carotid bruits ?LYMPHATICS: No lymphadenopathy ?CARDIAC: RRR, no murmurs, rubs, gallops ?RESPIRATORY:  Clear to auscultation without rales, wheezing or rhonchi  ?ABDOMEN: Soft, non-tender, non-distended ?MUSCULOSKELETAL:  No edema; No deformity  ?SKIN: Warm and dry ?NEUROLOGIC:  Alert and oriented x 3 ?PSYCHIATRIC:  Normal affect  ? ? ?Signed, ?Shirlee More, MD  ?07/18/2021 11:50 AM    ?[place]  ?

## 2021-07-18 ENCOUNTER — Ambulatory Visit (INDEPENDENT_AMBULATORY_CARE_PROVIDER_SITE_OTHER): Payer: Medicare Other | Admitting: Cardiology

## 2021-07-18 ENCOUNTER — Encounter: Payer: Self-pay | Admitting: Cardiology

## 2021-07-18 VITALS — BP 146/78 | HR 61 | Ht 72.0 in | Wt 274.0 lb

## 2021-07-18 DIAGNOSIS — I872 Venous insufficiency (chronic) (peripheral): Secondary | ICD-10-CM

## 2021-07-18 DIAGNOSIS — I10 Essential (primary) hypertension: Secondary | ICD-10-CM

## 2021-07-18 DIAGNOSIS — E782 Mixed hyperlipidemia: Secondary | ICD-10-CM

## 2021-07-18 DIAGNOSIS — I7789 Other specified disorders of arteries and arterioles: Secondary | ICD-10-CM

## 2021-07-18 DIAGNOSIS — I25118 Atherosclerotic heart disease of native coronary artery with other forms of angina pectoris: Secondary | ICD-10-CM | POA: Diagnosis not present

## 2021-07-18 NOTE — Patient Instructions (Signed)

## 2021-07-19 ENCOUNTER — Encounter: Payer: Self-pay | Admitting: Neurology

## 2021-07-19 DIAGNOSIS — R2689 Other abnormalities of gait and mobility: Secondary | ICD-10-CM | POA: Diagnosis not present

## 2021-07-20 ENCOUNTER — Encounter: Payer: Self-pay | Admitting: Neurology

## 2021-07-24 DIAGNOSIS — R2689 Other abnormalities of gait and mobility: Secondary | ICD-10-CM | POA: Diagnosis not present

## 2021-07-26 DIAGNOSIS — R2689 Other abnormalities of gait and mobility: Secondary | ICD-10-CM | POA: Diagnosis not present

## 2021-07-29 ENCOUNTER — Other Ambulatory Visit: Payer: Self-pay | Admitting: Cardiology

## 2021-08-01 DIAGNOSIS — H40053 Ocular hypertension, bilateral: Secondary | ICD-10-CM | POA: Diagnosis not present

## 2021-08-01 DIAGNOSIS — H2513 Age-related nuclear cataract, bilateral: Secondary | ICD-10-CM | POA: Diagnosis not present

## 2021-08-01 DIAGNOSIS — H18413 Arcus senilis, bilateral: Secondary | ICD-10-CM | POA: Diagnosis not present

## 2021-08-01 DIAGNOSIS — H2512 Age-related nuclear cataract, left eye: Secondary | ICD-10-CM | POA: Diagnosis not present

## 2021-08-01 DIAGNOSIS — H348322 Tributary (branch) retinal vein occlusion, left eye, stable: Secondary | ICD-10-CM | POA: Diagnosis not present

## 2021-08-01 NOTE — Progress Notes (Signed)
? ?Assessment/Plan:  ? ?1.  Parkinson's disease ? -Patient tried levodopa, but only for about a month.  The titration schedule is a month long, so patient was not on it for very long.  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  Discussed concept of levodopa resistant tremor, which is present in 30 to 50% of patients who have Parkinson's disease.  Nonetheless, this does not mean that levodopa does not work for the disease itself, but rather for tremor.  Patient has clear rigidity, at least moderate, on the left.  Discussed that I would guess that levodopa would be helpful for the rigidity, which is why he needs to be on it.  Patient was agreeable.  Sent a new titration schedule today.  He will take carbidopa/levodopa 25/100, 1 tablet at 7 AM/11 AM/4 PM.  Discussed potential interactions with protein. ? -Long discussion regarding the importance of safe, cardiovascular exercise and exactly what that means.  He is currently enrolled in physical therapy.  Met with my LCSW today to discuss community exercise programs. ? -We discussed community resources in the area including patient support groups and community exercise programs for PD and pt education was provided to the patient. ? -Discussed the new skin biopsies. ? -Discussed genetic testing.  Patients sister does have history of Parkinson's disease. ? ? ?2.  Alcohol use  ? -Discussed with patient that I would like him to taper his alcohol to no more than 2 days/week.  Discussed that levodopa is going to be metabolized through the liver at least 3 times per day.  He is currently drinking about 2-3 scotch drinks per day.  Discussed that alcohol also causes loss of balance and cognitive change, which he is already at risk for because of Parkinson's disease.  In addition, he has already been treated for neuropathy, which alcohol will cause/worsen. ? ?3.  Chronic nausea ? -has upcoming appt with Dr. Michail Sermon.  This is longstanding, prior to  addition of levodopa.  He has no constipation. ? ?4.  Dizziness  ?-Has already been evaluated by Dr. Bettina Gavia.  Patient states that he is not orthostatic.  Discussed with patient that the only time Parkinson's really causes dizziness is when there is dysautonomia/orthostasis playing a role.  Discussed importance of water intake and exactly what that meant. ? ?5.  PN ? -Following with Dr. Felecia Shelling. ? ?6.  Headache, intermittent ? -Following with Dr. Felecia Shelling. ? ?7.  Patient is going to follow back up with Dr. Felecia Shelling.  He will follow-up here on an as-needed basis. ?Subjective:  ? ?Todd Chan was seen today in the movement disorders clinic for neurologic consultation at the request of Bettina Gavia, Hilton Cork, MD.  The consultation is for the evaluation of 2nd opinion for tremor/PD.  Pt with wife who supplements hx.  Pt is a current patient of [facility] and has been following with Dr. Felecia Shelling and Dr. Jaynee Eagles.  Outside records that were made available to me were reviewed.  Patient first on [facility] in 2019, which was a consultation for 1 month history of numbness in the feet and ankles.  He was lost to follow-up until 2021.  He was seen at that point in time by Dr. Felecia Shelling for consultation for tremor.  Records from that consultation indicate that the patient had had progressive tremor on the left side of the body for 5 to 6 years prior to that consultation.  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  He was started on primidone.  Patient did not tolerate this because of lightheadedness and dizziness.  He followed up 4 months later and was given Ativan to try instead.  Patient did not find this to be of any benefit and he discontinued it.  He has been on metoprolol, 25 mg twice per day, from cardiology.  He was seen in May, 2022 and told that he may need to consider DBS for essential tremor.   The biggest issue then, however, was headache.  MRI brain and cervical spine were completed and were essentially unremarkable.  He was started on Zonegran in June, 2022.  Patient was seen by Dr. Jaynee Eagles in December, 2022 for a second opinion.  She felt that the patient could have Parkinson's disease.  DaTscan was ordered, but ultimately declined by the patient.  Therefore, the patient was just started on levodopa in January, 2023.  He followed up with Dr. Felecia Shelling in mid February and stated that levodopa did not help.  Patient was complaining about dizziness at that time and was started on Mestinon.  He doesn't think he took that b/c he was worried about nausea.   ? ?Tremor: Yes.    ? At rest or with activation?  rest ? Fam hx of tremor?  Yes.  , sister with Parkinsons Disease  ? Located where?  Started L arm, now both arms and both legs per wife.  The L is worse per wife ? Affected by caffeine:  No. ? Affected by alcohol:  No. (2-3 drinks scotch per day) ? Affected by stress:  Yes.   ? Affected by fatigue:  Yes.   ? Spills soup if on spoon:  No. But has to be careful ? Affects ADL's (tying shoes, brushing teeth, etc):  No., still shaves with a blade ? Tremor inducing meds:  No. ? ?Other Specific Symptoms:  ?Voice: little softer ?Sleep: some nocturia x 2-3 x ? Vivid Dreams:  Yes.   ? Acting out dreams:  No. ?Wet Pillows: No. ?Postural symptoms:  a little off ? Falls?  No. ?Bradykinesia symptoms: shuffling gait, slow movements, and difficulty getting out of a chair ?Loss of smell:  Yes.   X years ?Loss of taste:  Yes.   ?Urinary Incontinence:  No. ?Difficulty Swallowing:  No. ?Handwriting, micrographia: Yes.   ?Trouble with ADL's:  No. ? Trouble buttoning clothing: No. ?Depression:  No. ?Memory changes:  No. ?Hallucinations:  No. ? visual distortions: No. ?N/V:  chronic nausea ?Lightheaded:  Yes.  , just when first gets up ? Syncope: No. ?Diplopia:  a little - he associates with cataracts  - he has upcoming sx at end of  July ? ? ? ? ?PREVIOUS MEDICATIONS: primidone (lightheaded); Ativan; metoprolol (by cardiology); Zonegran (for headache) ? ?ALLERGIES:  No Known Allergies ? ?CURRENT MEDICATIONS:  ?Current Outpatient Medications  ?Medication Instructions  ? albuterol (VENTOLIN HFA) 108 (90 Base) MCG/ACT inhaler 1 puff as needed  ? aspirin 81 MG EC tablet 1 tablet  ? Coenzyme Q10 (CO Q 10) 100 MG CAPS See admin instructions  ? latanoprost (XALATAN) 0.005 % ophthalmic solution 1 drop, Left Eye, Daily  ? metoprolol tartrate (LOPRESSOR) 25 MG tablet TAKE 1 TABLET TWICE A DAY  ? Multiple Vitamin (MULTIVITAMIN) capsule 1 capsule, Oral, Daily  ? Omega-3 Fatty Acids (FISH OIL PO) 1,290 mg, Oral, Daily  ?  pantoprazole (PROTONIX) 40 MG tablet 1 tablet  ? pravastatin (PRAVACHOL) 20 mg, Oral, Daily  ? telmisartan-hydrochlorothiazide (MICARDIS HCT) 40-12.5 MG tablet TAKE 1 TABLET DAILY  ? Vitamin D3 2,000 Units, Oral, Daily  ? ? ?Objective:  ? ?PHYSICAL EXAMINATION:   ? ?VITALS:   ?Vitals:  ? 08/02/21 1005  ?BP: (!) 141/90  ?Pulse: 69  ?SpO2: 98%  ?Weight: 275 lb 12.8 oz (125.1 kg)  ?Height: 6' (1.829 m)  ? ? ?GEN:  The patient appears stated age and is in NAD. ?HEENT:  Normocephalic, atraumatic.  The mucous membranes are moist. The superficial temporal arteries are without ropiness or tenderness. ?CV:  RRR ?Lungs:  CTAB ?Neck/HEME:  There are no carotid bruits bilaterally. ? ?Neurological examination: ? ?Orientation: The patient is alert and oriented x3.  ?Cranial nerves: There is good facial symmetry.  Extraocular muscles are intact. The visual fields are full to confrontational testing. The speech is fluent and clear. Soft palate rises symmetrically and there is no tongue deviation. Hearing is intact to conversational tone. ?Sensation: Sensation is intact to light touch throughout (facial, trunk, extremities). Vibration is decreased distally. There is no extinction with double simultaneous stimulation.  ?Motor: Strength is 5/5 in the  bilateral upper and lower extremities.   Shoulder shrug is equal and symmetric.  There is no pronator drift. ?Deep tendon reflexes: Deep tendon reflexes are 1/4 at the bilateral biceps, triceps, brachioradialis,

## 2021-08-02 ENCOUNTER — Encounter: Payer: Self-pay | Admitting: Neurology

## 2021-08-02 ENCOUNTER — Ambulatory Visit (INDEPENDENT_AMBULATORY_CARE_PROVIDER_SITE_OTHER): Payer: Medicare Other | Admitting: Neurology

## 2021-08-02 VITALS — BP 141/90 | HR 69 | Ht 72.0 in | Wt 275.8 lb

## 2021-08-02 DIAGNOSIS — G20 Parkinson's disease: Secondary | ICD-10-CM | POA: Diagnosis not present

## 2021-08-02 DIAGNOSIS — I25118 Atherosclerotic heart disease of native coronary artery with other forms of angina pectoris: Secondary | ICD-10-CM

## 2021-08-02 DIAGNOSIS — R42 Dizziness and giddiness: Secondary | ICD-10-CM | POA: Diagnosis not present

## 2021-08-02 DIAGNOSIS — R110 Nausea: Secondary | ICD-10-CM | POA: Diagnosis not present

## 2021-08-02 DIAGNOSIS — Z789 Other specified health status: Secondary | ICD-10-CM

## 2021-08-02 MED ORDER — CARBIDOPA-LEVODOPA 25-100 MG PO TABS: 1.0000 | ORAL_TABLET | Freq: Three times a day (TID) | ORAL | 0 refills | Status: DC

## 2021-08-02 NOTE — Patient Instructions (Signed)
Start Carbidopa Levodopa as follows: ?Take 1/2 tablet three times daily, at least 30 minutes before meals (approximately 7am/11am/4pm), for one week ?Then take 1/2 tablet in the morning, 1/2 tablet in the afternoon, 1 tablet in the evening, at least 30 minutes before meals, for one week ?Then take 1/2 tablet in the morning, 1 tablet in the afternoon, 1 tablet in the evening, at least 30 minutes before meals, for one week ?Then take 1 tablet three times daily at 7am/11am/4pm, at least 30 minutes before meals ?  As a reminder, carbidopa/levodopa can be taken at the same time as a carbohydrate, but we like to have you take your pill either 30 minutes before a protein source or 1 hour after as protein can interfere with carbidopa/levodopa absorption. ? ?Local and Online Resources for Power over Parkinson's Group ?May 2023 ? ?LOCAL [place] PARKINSON'S GROUPS  ?Power over Parkinson's Group:   ?Power Over Parkinson's Patient Education Group will be Wednesday, May 10th-*Hybrid meting*- in person at [facility] location and via [facility] at 2:00 pm.   ?Upcoming Power over Parkinson's Meetings:  2nd Wednesdays of the month at 2 pm:  May 10th, June 14th, July 12th ?Contact Amy Marriott at [email or web address] if interested in participating in this group ?Parkinson's Care Partners Group:    3rd Mondays, Contact Misty Paladino ?Atypical Parkinsonian Patient Group:   4th Wednesdays, Contact Misty Paladino ?If you are interested in participating in these groups with Misty, please contact her directly for how to join those meetings.  Her contact information is [email or web address].   ? ?LOCAL EVENTS AND NEW OFFERINGS ?Moving Day [place]:  Saturday, May 6th, 9:30 am at [place], [place], [place]. Participate in Moving Day as a way to ?honor loved ones, raise funds, fight Parkinson's disease, and celebrate movement.?  Register today at [email or web address] ?[place]!  Play [place]!  Join [place] for home game for a fun evening to bring awareness of Parkinson's and raise funds for our Movement Disorder Funds. Rescheduled to May 11th  6:30 pm [place]. To purchase tickets:  [email or web address] ?Parkinson's T-shirts for sale!  Designed by a local group member, with funds going to [place].  $25.00  Contact Misty to purchase  ?New PWR! Moves [employer] Instructor-Led Class offering at [employer]!  Wednesdays 1-2 pm, starting April 12th.   Contact Bryson Dames, Acupuncturist at [employer].  [email or web address] [email or web address] ? ?ONLINE EDUCATION AND SUPPORT ?[place]:  www.[email or web address] ?PD Health at Home continues:  Mindfulness Mondays, Wellness Wednesdays, Fitness Fridays  ?Upcoming Education:  ?Understanding Gene and Cell-Based Therapies in Parkinson's.  Wednesday, May 10th at 1:00 pm ?Additional Education offerings virtually through their website-upcoming topics include Palliative Care/Hospice and PD, Sleep and PD ?Register for expert briefings Cytogeneticist) at [email or web address] ?Please check out their website to sign up for emails and see their full online offerings ? ? ?[place]:  [email or web address]  ?Third Thursday Webinars:  On the third Thursday of every month at 12 p.m. ET, join our free live webinars to learn about various aspects of living with Parkinson's disease and our work to speed medical breakthroughs. ?Upcoming Webinar: Get Moving: Exercising for a Healthy Brain.  Thursday, May 18th  at  12 noon. ?Check out additional information on their website to see their full online offerings ? ?[place]:  [email or web address] ?Upcoming Webinar:   Stay tuned ?Webinar Series:  Living with Parkinson's Meetup.   Third Thursdays each month, 3 pm ?Care Partner Monthly  Meetup.  With Robin Searing Phinney.  First Tuesday of each month, 2 pm ?Check out additional information to Live Well Today on their website ? ?Parkinson and Movement Disorders (PMD) Alliance:  www.[email or web address] ?[employer] Online:  Online Education Events ?Sign up for emails, which are sent weekly to give you updates on programming and online offerings ? ?Parkinson's Association of [place]:  www.[email or web address] ?Information on online support groups, education events, and online exercises including Yoga, Parkinson's exercises and more-LOTS of information on links to PD resources and online events ?Virtual Support Group through [employer] of the [place]; next one is scheduled for Wednesday, May 3rd at 2 pm. (These are typically scheduled for the 1st Wednesday of the month at 2 pm).  Visit website for details. ?MOVEMENT AND EXERCISE OPPORTUNITIES ?Parkinson's DRUMMING Classes/Music Therapy with Doylene Canning:  This is a returning class and it's FREE!  2nd Mondays, continuing May 8th, 11:00 at the [place].  Contact *Misty Taylor-Paladino at [email or web address] or Doylene Canning at [phone] or [email or web address]  ?PWR! Moves Classes at [place].  Wednesdays 10 and 11 am.   Contact Amy Marriott, PT [email or web address] if interested. ?NEW PWR! Moves Class offering at [employer].  Wednesdays 1-2 pm, starting April 12th.  Contact Bryson Dames, Acupuncturist at [employer].  [email or web address] ?Here is a link to the PWR!Moves classes on Zoom from [place] - Daily Mon-Sat at 10:00. Via Zoom, FREE and open to all.  There is also a link below via Facebook if you use that  platform. ? ?[email or web address] ?[email or web address] ? ?Parkinson's Wellness Recovery (PWR! Moves)  www.[email or web address] ?Info on the PWR! Virtual Experience:  You will have access to our expertise through self-assessment, guided plans that start with the PD-specific fundamentals, educational content, tips, Q&A with an expert, and a growing Art therapist of PD-specific pre-recorded and live exercise classes of varying types and intensity - both physical and cognitive! If that is not enough, we offer 1:1 wellness consultations (in-person or virtual) to personalize your PWR! Research scientist (medical).  ?[employer] Fridays:  ?As part of the PD Health @ Home program, this free video series focuses each week on one aspect of fitness designed to support people living with Parkinson's.  These weekly videos highlight the [place] recent fitness guidelines for people with Parkinson's disease. ?www.KVTVnet.com.cy ?Dance for PD website is offering free, live-stream classes throughout the week, as well as links to [employer] of classes:  [email or web address]://[email or web address] ?Virtual dance and Pilates for Parkinson's classes: Click on the Community Tab> Parkinson's Movement Initiative Tab.  To register for classes and for more information, visit [email or web address] and click the ?community? tab.  ?[employer] Parkinson's Cycling Classes  ?[employer]:  Thursdays @ Noon-Live classes at [employer] ([employer] at [email or web address] or [phone]) ?[employer]: Virtual Classes Mondays and Thursdays Jeanette Caprice classes Tuesday, Wednesday and Thursday (contact [place] at [email or web address]  or [phone]) ?[employer] ?Varied levels of  classes are offered Mondays, Tuesdays and Thursdays at [employer].  ?Stretching with Verdis Frederickson weekly class is also offered for people with Parkinson's ?To observe a class or for more information, call [phone] or email Hezzie Bump at [email or web address] ?ADDITIONAL SUPPORT AND RESOURCES ?[employer]:Online Caregiver Education Opportunities:  www.well-springsolutions.org/caregive

## 2021-08-03 DIAGNOSIS — R2689 Other abnormalities of gait and mobility: Secondary | ICD-10-CM | POA: Diagnosis not present

## 2021-08-07 DIAGNOSIS — R2689 Other abnormalities of gait and mobility: Secondary | ICD-10-CM | POA: Diagnosis not present

## 2021-08-09 DIAGNOSIS — R2689 Other abnormalities of gait and mobility: Secondary | ICD-10-CM | POA: Diagnosis not present

## 2021-08-10 DIAGNOSIS — M5416 Radiculopathy, lumbar region: Secondary | ICD-10-CM | POA: Diagnosis not present

## 2021-08-15 DIAGNOSIS — R2689 Other abnormalities of gait and mobility: Secondary | ICD-10-CM | POA: Diagnosis not present

## 2021-08-16 DIAGNOSIS — M5137 Other intervertebral disc degeneration, lumbosacral region: Secondary | ICD-10-CM | POA: Diagnosis not present

## 2021-08-16 DIAGNOSIS — G20 Parkinson's disease: Secondary | ICD-10-CM | POA: Diagnosis not present

## 2021-08-16 DIAGNOSIS — G250 Essential tremor: Secondary | ICD-10-CM | POA: Diagnosis not present

## 2021-08-16 DIAGNOSIS — R2689 Other abnormalities of gait and mobility: Secondary | ICD-10-CM | POA: Diagnosis not present

## 2021-08-16 DIAGNOSIS — K2100 Gastro-esophageal reflux disease with esophagitis, without bleeding: Secondary | ICD-10-CM | POA: Diagnosis not present

## 2021-08-16 DIAGNOSIS — I10 Essential (primary) hypertension: Secondary | ICD-10-CM | POA: Diagnosis not present

## 2021-08-16 DIAGNOSIS — D2371 Other benign neoplasm of skin of right lower limb, including hip: Secondary | ICD-10-CM | POA: Diagnosis not present

## 2021-08-16 DIAGNOSIS — R110 Nausea: Secondary | ICD-10-CM | POA: Diagnosis not present

## 2021-08-17 DIAGNOSIS — R2689 Other abnormalities of gait and mobility: Secondary | ICD-10-CM | POA: Diagnosis not present

## 2021-08-22 DIAGNOSIS — R2689 Other abnormalities of gait and mobility: Secondary | ICD-10-CM | POA: Diagnosis not present

## 2021-08-24 DIAGNOSIS — R2689 Other abnormalities of gait and mobility: Secondary | ICD-10-CM | POA: Diagnosis not present

## 2021-08-31 DIAGNOSIS — R2689 Other abnormalities of gait and mobility: Secondary | ICD-10-CM | POA: Diagnosis not present

## 2021-09-04 DIAGNOSIS — Z86010 Personal history of colonic polyps: Secondary | ICD-10-CM | POA: Diagnosis not present

## 2021-09-04 DIAGNOSIS — K219 Gastro-esophageal reflux disease without esophagitis: Secondary | ICD-10-CM | POA: Diagnosis not present

## 2021-09-04 DIAGNOSIS — R110 Nausea: Secondary | ICD-10-CM | POA: Diagnosis not present

## 2021-09-04 DIAGNOSIS — R2689 Other abnormalities of gait and mobility: Secondary | ICD-10-CM | POA: Diagnosis not present

## 2021-09-04 DIAGNOSIS — K2270 Barrett's esophagus without dysplasia: Secondary | ICD-10-CM | POA: Diagnosis not present

## 2021-09-12 ENCOUNTER — Encounter (INDEPENDENT_AMBULATORY_CARE_PROVIDER_SITE_OTHER): Payer: Self-pay | Admitting: Ophthalmology

## 2021-09-12 ENCOUNTER — Ambulatory Visit (INDEPENDENT_AMBULATORY_CARE_PROVIDER_SITE_OTHER): Payer: Medicare Other | Admitting: Ophthalmology

## 2021-09-12 ENCOUNTER — Encounter (INDEPENDENT_AMBULATORY_CARE_PROVIDER_SITE_OTHER): Payer: Medicare Other | Admitting: Ophthalmology

## 2021-09-12 DIAGNOSIS — H401121 Primary open-angle glaucoma, left eye, mild stage: Secondary | ICD-10-CM | POA: Diagnosis not present

## 2021-09-12 DIAGNOSIS — H2513 Age-related nuclear cataract, bilateral: Secondary | ICD-10-CM | POA: Diagnosis not present

## 2021-09-12 DIAGNOSIS — H34812 Central retinal vein occlusion, left eye, with macular edema: Secondary | ICD-10-CM

## 2021-09-12 DIAGNOSIS — I25118 Atherosclerotic heart disease of native coronary artery with other forms of angina pectoris: Secondary | ICD-10-CM

## 2021-09-12 MED ORDER — BEVACIZUMAB 2.5 MG/0.1ML IZ SOSY
2.5000 mg | PREFILLED_SYRINGE | INTRAVITREAL | Status: AC | PRN
  Administered 2021-09-12: 2.5 mg via INTRAVITREAL

## 2021-09-12 NOTE — Assessment & Plan Note (Signed)
Agree with Dr. Clydene Laming and Dr. Talbert Forest that patient does require cataract surgery with intraocular lens implantation OU.  Right eye is denser cataract.

## 2021-09-12 NOTE — Patient Instructions (Signed)
Patient instructed to continue on latanoprost 1 drop left eye nightly

## 2021-09-12 NOTE — Progress Notes (Signed)
09/12/2021     CHIEF COMPLAINT Patient presents for  Chief Complaint  Patient presents with   Central Retinal Vein Occlusion      HISTORY OF PRESENT ILLNESS: Todd Chan is a 76 y.o. male who presents to the clinic today for:   HPI   4 mos dilate OU color FP, OCT. Patient states vision is stable and unchanged since last visit. Denies any new floaters or FOL. Patient reports he uses Latanoprost OU QHS and AT's. Last edited by Laurin Coder on 09/12/2021  9:45 AM.      Referring physician: Keene Breath., MD [place] [address],  [place] [address]  HISTORICAL INFORMATION:   Selected notes from the MEDICAL RECORD NUMBER       CURRENT MEDICATIONS: Current Outpatient Medications (Ophthalmic Drugs)  Medication Sig   latanoprost (XALATAN) 0.005 % ophthalmic solution Place 1 drop into the left eye daily.   No current facility-administered medications for this visit. (Ophthalmic Drugs)   Current Outpatient Medications (Other)  Medication Sig   albuterol (VENTOLIN HFA) 108 (90 Base) MCG/ACT inhaler 1 puff as needed (Patient not taking: Reported on 08/02/2021)   aspirin 81 MG EC tablet 1 tablet   carbidopa-levodopa (SINEMET IR) 25-100 MG tablet Take 1 tablet by mouth 3 (three) times daily. 7am/11am/4pm   Cholecalciferol (VITAMIN D3) 50 MCG (2000 UT) capsule Take 2,000 Units by mouth daily.   Coenzyme Q10 (CO Q 10) 100 MG CAPS See admin instructions.   metoprolol tartrate (LOPRESSOR) 25 MG tablet TAKE 1 TABLET TWICE A DAY   Multiple Vitamin (MULTIVITAMIN) capsule Take 1 capsule by mouth daily.   Omega-3 Fatty Acids (FISH OIL PO) Take 1,290 mg by mouth daily.   pantoprazole (PROTONIX) 40 MG tablet 1 tablet   pravastatin (PRAVACHOL) 20 MG tablet Take 1 tablet (20 mg total) by mouth daily.   telmisartan-hydrochlorothiazide (MICARDIS HCT) 40-12.5 MG tablet TAKE 1 TABLET DAILY   No current facility-administered medications for this visit. (Other)       REVIEW OF SYSTEMS: ROS   Negative for: Constitutional, Gastrointestinal, Neurological, Skin, Genitourinary, Musculoskeletal, HENT, Endocrine, Cardiovascular, Eyes, Respiratory, Psychiatric, Allergic/Imm, Heme/Lymph Last edited by Hurman Horn, MD on 09/12/2021 10:27 AM.       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   KNEE ARTHROSCOPY Left 6-7 years ago   LEFT HEART CATH AND CORONARY ANGIOGRAPHY N/A 04/17/2017   Procedure: LEFT HEART CATH AND CORONARY ANGIOGRAPHY;  Surgeon: Troy Sine, MD;  Location: [facility];  Service: Cardiovascular;  Laterality: N/A;   RADIOLOGY WITH ANESTHESIA N/A 08/30/2020   Procedure: MRI WITH ANESTHESIA BRAIN WITH AND WITHOUT AND MRI CERVICAL SPINE WITH AND WITHOUT;  Surgeon: Radiologist, Medication, MD;  Location: [facility];  Service: Radiology;  Laterality: N/A;   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   TONSILLECTOMY     TOTAL KNEE ARTHROPLASTY Left 10/08/2016   Procedure: LEFT TOTAL KNEE ARTHROPLASTY;  Surgeon: Gaynelle Arabian, MD;  Location: [facility];  Service:  Orthopedics;  Laterality: Left;   TOTAL KNEE ARTHROPLASTY Right 06/17/2017   Procedure: RIGHT TOTAL KNEE ARTHROPLASTY;  Surgeon: Gaynelle Arabian, MD;  Location: [facility];  Service: Orthopedics;  Laterality: Right;    FAMILY HISTORY Family History  Problem Relation Age of Onset   Breast cancer Mother 16   Prostate cancer Father    Breast cancer Sister 51   Parkinson's disease Sister    Healthy Daughter    Neuropathy Neg Hx     SOCIAL HISTORY Social  History   Tobacco Use   Smoking status: Former    Packs/day: 1.00    Years: 20.00    Total pack years: 20.00    Types: Cigarettes    Quit date: 10/1987    Years since quitting: 33.9   Smokeless tobacco: Never   Tobacco comments:    Quit 34 years ago  Vaping Use   Vaping Use: Never used  Substance Use Topics   Alcohol use: Yes    Alcohol/week: 7.0 standard drinks of alcohol    Types: 7 Glasses of wine per week    Comment: daily, 2-3 glasses scotch per day   Drug use: No         OPHTHALMIC EXAM:  Base Eye Exam     Visual Acuity (ETDRS)       Right Left   Dist cc 20/50 +125 20/25 -2   Dist ph cc 20/30 -2     Correction: Glasses         Tonometry (Tonopen, 9:48 AM)       Right Left   Pressure 17 17         Pupils       Pupils Dark Light APD   Right PERRL 4 3 None   Left PERRL 4 3 None         Visual Fields (Counting fingers)       Left Right    Full Full         Extraocular Movement       Right Left    Full Full         Neuro/Psych     Oriented x3: Yes   Mood/Affect: Normal         Dilation     Both eyes: 1.0% Mydriacyl, 2.5% Phenylephrine @ 9:48 AM           Slit Lamp and Fundus Exam     External Exam       Right Left   External Normal Normal         Slit Lamp Exam       Right Left   Lids/Lashes Normal Normal   Conjunctiva/Sclera White and quiet White and quiet   Cornea Clear Clear   Anterior Chamber Deep and quiet Deep and quiet   Iris Round and reactive Round and reactive   Lens 2+ Nuclear sclerosis Central color change and sclerosis yet outer clear cortex, accounts for acuity 2+ Nuclear sclerosis central color change and sclerosis yet outer clear cortex   Anterior Vitreous Normal Normal         Fundus Exam       Right Left   Posterior Vitreous Normal Normal   Disc Normal Possible early collaterals   C/D Ratio 0.1 0.1   Macula Normal Less clinical intraretinal hemorrhage superiorly, but no CME    Vessels Normal Superior Hemi central retinal vein occlusion   Periphery Normal Normal  Refraction     Wearing Rx     Age: 71 months            IMAGING AND PROCEDURES  Imaging and Procedures for 09/12/21  OCT, Retina - OU - Both Eyes       Right Eye Quality was good. Scan locations included subfoveal. Central Foveal Thickness: 303. Progression has been stable. Findings include normal foveal contour.   Left Eye Quality was good. Scan locations included subfoveal. Central Foveal Thickness: 302. Progression has improved. Findings include abnormal foveal contour.   Notes OS much less intraretinal hemorrhage as Hemi CRV O continues to improve now At 13-week interval, repeat injection today and follow-up next in   5 months     Color Fundus Photography Optos - OU - Both Eyes       Right Eye Progression has no prior data. Disc findings include normal observations. Macula : normal observations. Vessels : normal observations. Periphery : normal observations.   Left Eye Progression has no prior data. Disc findings include normal observations. Macula : normal observations.   Notes With media opacity from cataract.  OS with no active intraretinal hemorrhages, Early collateral form patient is noted on the optic nerve superiorly.  Superior Hemi BRVO has mostly resolved although the superior vein is engorged.     Intravitreal Injection, Pharmacologic Agent - OS - Left Eye       Time Out 09/12/2021. 10:36 AM. Confirmed correct patient, procedure, site, and patient consented.   Anesthesia Topical anesthesia was used. Anesthetic medications included Lidocaine 4%.   Procedure Preparation included 5% betadine to ocular surface, 10% betadine to eyelids, Tobramycin 0.3%, Ofloxacin . A 30 gauge needle was used.   Injection: 2.5 mg bevacizumab 2.5 MG/0.1ML   Route: Intravitreal, Site: Left Eye   NDC: [phone], Lot: [id number], Expiration date: 10/29/2021    Post-op Post injection exam found visual acuity of at least counting fingers. The patient tolerated the procedure well. There were no complications. The patient received written and verbal post procedure care education. Post injection medications included ocuflox.              ASSESSMENT/PLAN:  Primary open-angle glaucoma, left eye, mild stage Continue on topical antiglaucoma therapy for now.  Management on going as per Dr. Clydene Laming.  Intraocular pressure may decline in the left eye post cataract surgery.  Nonetheless the lower IOP does help and assist prevention of hemiCRVO developing again  Cataract, nuclear sclerotic, both eyes Agree with Dr. Clydene Laming and Dr. Talbert Forest that patient does require cataract surgery with intraocular lens implantation OU.  Right eye is denser cataract.     ICD-10-CM   1. 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 Primary open-angle glaucoma, left eye, mild stage  H40.1121     3. Cataract, nuclear sclerotic, both eyes  H25.13       1.  OS appears to have compensated superior Hemi CRVO and resolution of CME currently at 22-monthfollow-up.  However patient has upcoming cataract surgery.  Patient may take this condition from recurring with ongoing engorged vein superiorly.  We will repeat injection Avastin OS today and will reevaluate next visit in 5 months post cataract surgery planned August 2023  2.  I agree needs cataract surgery OU  3.  Follow-up Dr. Barbie Haggis as scheduled.  I do recommend continuing latanoprost left eye nightly simply to control intraocular pressure as ongoing healing occurs the left eye to promote collateralization of vessels on the optic nerve to bypass the previous Hemi CRV O.  It is possible in the  future that latanoprost could be discontinued as cataract surgery often lowers intraocular pressure long-term  Ophthalmic Meds Ordered this visit:  Meds ordered this encounter  Medications   bevacizumab (AVASTIN) SOSY 2.5 mg       Return in about 5 months (around 02/12/2022) for DILATE OU, COLOR FP, OCT.  Patient Instructions  Patient instructed to continue on latanoprost 1 drop left eye nightly   Explained the diagnoses, plan, and follow up with the patient and they expressed understanding.  Patient expressed understanding of the importance of proper follow up care.   Clent Demark Laurelai Lepp M.D. Diseases & Surgery of the Retina and Vitreous [facility] [place] 09/12/21     Abbreviations: M myopia (nearsighted); A astigmatism; H hyperopia (farsighted); P presbyopia; Mrx spectacle prescription;  CTL contact lenses; OD right eye; OS left eye; OU both eyes  XT exotropia; ET esotropia; PEK punctate epithelial keratitis; PEE punctate epithelial erosions; DES dry eye syndrome; MGD meibomian gland dysfunction; ATs artificial tears; PFAT's preservative free artificial tears; [place] nuclear sclerotic cataract; PSC posterior subcapsular cataract; ERM epi-retinal membrane; PVD posterior vitreous detachment; RD retinal detachment; DM diabetes mellitus; DR diabetic retinopathy; NPDR non-proliferative diabetic retinopathy; PDR proliferative diabetic retinopathy; CSME clinically significant macular edema; DME diabetic macular edema; dbh dot blot hemorrhages; CWS cotton wool spot; POAG primary open angle glaucoma; C/D cup-to-disc ratio; HVF humphrey visual field; GVF goldmann visual field; OCT optical coherence tomography; IOP intraocular pressure; BRVO Branch retinal vein occlusion; CRVO central retinal vein occlusion; CRAO central retinal artery occlusion; BRAO branch retinal artery occlusion; RT retinal tear; SB scleral buckle; PPV pars plana vitrectomy; VH Vitreous hemorrhage; PRP panretinal laser  photocoagulation; IVK intravitreal kenalog; VMT vitreomacular traction; MH Macular hole;  NVD neovascularization of the disc; NVE neovascularization elsewhere; AREDS age related eye disease study; ARMD age related macular degeneration; POAG primary open angle glaucoma; EBMD epithelial/anterior basement membrane dystrophy; ACIOL anterior chamber intraocular lens; IOL intraocular lens; PCIOL posterior chamber intraocular lens; Phaco/IOL phacoemulsification with intraocular lens placement; [place] photorefractive keratectomy; LASIK laser assisted in situ keratomileusis; HTN hypertension; DM diabetes mellitus; COPD chronic obstructive pulmonary disease

## 2021-09-12 NOTE — Assessment & Plan Note (Signed)
Continue on topical antiglaucoma therapy for now.  Management on going as per Dr. Clydene Laming.  Intraocular pressure may decline in the left eye post cataract surgery.  Nonetheless the lower IOP does help and assist prevention of hemiCRVO developing again

## 2021-09-18 DIAGNOSIS — R509 Fever, unspecified: Secondary | ICD-10-CM | POA: Diagnosis not present

## 2021-09-18 DIAGNOSIS — J069 Acute upper respiratory infection, unspecified: Secondary | ICD-10-CM | POA: Diagnosis not present

## 2021-09-18 DIAGNOSIS — Z03818 Encounter for observation for suspected exposure to other biological agents ruled out: Secondary | ICD-10-CM | POA: Diagnosis not present

## 2021-09-18 DIAGNOSIS — R051 Acute cough: Secondary | ICD-10-CM | POA: Diagnosis not present

## 2021-09-27 DIAGNOSIS — R2689 Other abnormalities of gait and mobility: Secondary | ICD-10-CM | POA: Diagnosis not present

## 2021-10-05 DIAGNOSIS — H40053 Ocular hypertension, bilateral: Secondary | ICD-10-CM | POA: Diagnosis not present

## 2021-10-05 DIAGNOSIS — H34832 Tributary (branch) retinal vein occlusion, left eye, with macular edema: Secondary | ICD-10-CM | POA: Diagnosis not present

## 2021-10-05 DIAGNOSIS — H2513 Age-related nuclear cataract, bilateral: Secondary | ICD-10-CM | POA: Diagnosis not present

## 2021-10-10 DIAGNOSIS — R2689 Other abnormalities of gait and mobility: Secondary | ICD-10-CM | POA: Diagnosis not present

## 2021-10-12 ENCOUNTER — Telehealth: Payer: Self-pay | Admitting: Podiatry

## 2021-10-12 ENCOUNTER — Encounter: Payer: Self-pay | Admitting: Podiatry

## 2021-10-12 ENCOUNTER — Ambulatory Visit (INDEPENDENT_AMBULATORY_CARE_PROVIDER_SITE_OTHER): Payer: Medicare Other | Admitting: Podiatry

## 2021-10-12 DIAGNOSIS — I739 Peripheral vascular disease, unspecified: Secondary | ICD-10-CM

## 2021-10-12 DIAGNOSIS — B351 Tinea unguium: Secondary | ICD-10-CM | POA: Diagnosis not present

## 2021-10-12 DIAGNOSIS — G609 Hereditary and idiopathic neuropathy, unspecified: Secondary | ICD-10-CM

## 2021-10-12 DIAGNOSIS — M79675 Pain in left toe(s): Secondary | ICD-10-CM | POA: Diagnosis not present

## 2021-10-12 DIAGNOSIS — M79674 Pain in right toe(s): Secondary | ICD-10-CM | POA: Diagnosis not present

## 2021-10-12 DIAGNOSIS — I872 Venous insufficiency (chronic) (peripheral): Secondary | ICD-10-CM

## 2021-10-12 NOTE — Telephone Encounter (Signed)
Thank you :)

## 2021-10-12 NOTE — Progress Notes (Signed)
  Subjective:  Patient ID: Todd Chan, male    DOB: 1945/05/06,   MRN: [id number]  Chief Complaint  Patient presents with   Nail Problem    Routine foot care    76 y.o. male presents for concern of painful thickened and elongated toenails with a history of PVD and neuropathy. Denies diabetes. Hoping to have nails trimmed as well.  . Denies any other pedal complaints. Denies n/v/f/c.   PCP: Crissie Sickles DO   Past Medical History:  Diagnosis Date   Arthritis    GERD (gastroesophageal reflux disease)    Glaucoma    Hypertension    Osteoarthritis of knee    Painful urination    Peripheral vascular disease ([place])    bilateral edema     Objective:  Physical Exam: Vascular: DP/PT pulses 1/4 bilateral. CFT <3 seconds. Normal hair growth on digits. No edema.  Skin. No lacerations or abrasions bilateral feet. Nails 1-5 are thickened discolored and elongated with subungual debris.  Musculoskeletal: MMT 5/5 bilateral lower extremities in DF, PF, Inversion and Eversion. Deceased ROM in DF of ankle joint. No pain to palpation.  Neurological: Sensation diminished to light touch. Protective sensation present to all 10 locations with SWMF.   Assessment:   1. Idiopathic peripheral neuropathy   2. Venous (peripheral) insufficiency   3. Pain due to onychomycosis of toenails of both feet   4. Peripheral vascular disease ([place])       Plan:  Patient was evaluated and treated and all questions answered. Discussed neuropathy and etiology as well as treatment with patient.  -Discussed and educated patient on foot care, especially with  regards to the vascular, neurological and musculoskeletal systems.  -Discussed supportive shoes at all times and checking feet regularly.  -Nails 1-5 b/l debrided today without incident.  -Patient to return in 3 months for at risk foot care.    Lorenda Peck, DPM

## 2021-10-12 NOTE — Telephone Encounter (Signed)
Pt was seen today in [place] office and was told to call our office to get his 3 month appt for his nail trim.  Upon checking Dr Blenda Mounts schedule is not opened  as she is expecting and I asked pt to call back in a month or so and we should have a schedule opened. He said this is ridiculous and I did apologize. He said what if she got into an accident tomorrow who would cover for her. I  explained we have a new provider starting and will get that opened as soon as we can.

## 2021-10-17 DIAGNOSIS — R2689 Other abnormalities of gait and mobility: Secondary | ICD-10-CM | POA: Diagnosis not present

## 2021-10-18 ENCOUNTER — Other Ambulatory Visit: Payer: Self-pay | Admitting: Neurology

## 2021-10-30 DIAGNOSIS — H2513 Age-related nuclear cataract, bilateral: Secondary | ICD-10-CM | POA: Diagnosis not present

## 2021-10-30 DIAGNOSIS — H2512 Age-related nuclear cataract, left eye: Secondary | ICD-10-CM | POA: Diagnosis not present

## 2021-10-31 DIAGNOSIS — H2511 Age-related nuclear cataract, right eye: Secondary | ICD-10-CM | POA: Diagnosis not present

## 2021-11-07 ENCOUNTER — Encounter: Payer: Self-pay | Admitting: Cardiology

## 2021-11-20 DIAGNOSIS — H52201 Unspecified astigmatism, right eye: Secondary | ICD-10-CM | POA: Diagnosis not present

## 2021-11-20 DIAGNOSIS — H2511 Age-related nuclear cataract, right eye: Secondary | ICD-10-CM | POA: Diagnosis not present

## 2021-11-30 ENCOUNTER — Encounter: Payer: Self-pay | Admitting: Neurology

## 2021-11-30 ENCOUNTER — Ambulatory Visit (INDEPENDENT_AMBULATORY_CARE_PROVIDER_SITE_OTHER): Payer: Medicare Other | Admitting: Neurology

## 2021-11-30 VITALS — BP 146/86 | HR 71 | Ht 72.0 in | Wt 293.4 lb

## 2021-11-30 DIAGNOSIS — G20 Parkinson's disease: Secondary | ICD-10-CM | POA: Diagnosis not present

## 2021-11-30 DIAGNOSIS — G629 Polyneuropathy, unspecified: Secondary | ICD-10-CM

## 2021-11-30 DIAGNOSIS — M542 Cervicalgia: Secondary | ICD-10-CM

## 2021-11-30 DIAGNOSIS — R42 Dizziness and giddiness: Secondary | ICD-10-CM | POA: Diagnosis not present

## 2021-11-30 MED ORDER — CARBIDOPA-LEVODOPA 25-100 MG PO TABS: 1.0000 | ORAL_TABLET | Freq: Four times a day (QID) | ORAL | 1 refills | Status: DC

## 2021-11-30 NOTE — Progress Notes (Signed)
[facility]  PATIENT: Todd Chan DOB: 1946-03-07  REFERRING DOCTOR OR PCP:  Crissie Sickles, DO SOURCE: patient, notes from Dr. Jaynee Eagles and PCP  _________________________________   HISTORICAL  CHIEF COMPLAINT:  Chief Complaint  Patient presents with   Follow-up    RM 1 with wife. Last seen 05/17/21.  Lightheadedness ongoing, neuropathy stable. No falls.  Had cataract surgery 10/30/21 (left eye) and 11/20/21 (right eye). Still currently doing eye drops.    HISTORY OF PRESENT ILLNESS:  Shay Bartoli is a 76 y.o. man with spells of lightheadedness, nausea, tremors and neuropathy.    Update 05/17/2021: He continues to have spells of lightheadedness - usually right after standing up, espeically if sitting longer time.  These spells last 30-60 seconds.    We had him try mestinon but it did not help so he stopped.      He has HTN but no known cardiac issues.      He has a tremor in hs hands.   he started Sinemet and saw Dr. Carles Collet.  He does not think he got much benefit from the Sinemet as far as the tremor.   Current dose is   He went to the ED due to feeling worse with more nausea and lightheadedness and leg weakness.      He also was having headache.   He still has some occipitla headache on the right seems to start in his neck.      BP ws 180/101 and has been in that range some over last couple weeks but was fine 3 weeks ago at last PCP visit.  They also did orthostatics  Hs lightheadedness is worse when he stands up - this is worse if sitting a while.   He also feels more lightheaded after he bends over (to tie shoes).   This usually occurs for a minute or 2 and may also occur if he is standing a while.  He feels pressure in his ears while this is occurring.    We had tried metoclopramide for his nausea without benefit.      Headaches occur frequently, currently about 5 days a week beginning in the morning.    These are often associated with nausea.   Pain is  left > right most of the time but is greater on the right today..   Neck is sometimes stiff and there is upper neck pain/occipital pain.   Most of the time the headaches only last for an hour or so.  Nausea this morning lasted 20 minutes - which is typical.  Ginger Ale helps.  If he wakes up without a headache, there is no nausea.   Ondansetron had not helped.   Tylenol has not helped much.    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    He continues to have a left sided tremor.   Unclear if essential vs. Parkinsonian. Primidone was tried but he had headaches and lightheaded and stopped.  It did not seem to help noticeably.    Ativan had npt helped.  He is also on metoprolol 25 mg po bid (sees cardiology for an aortic tear) but does not feel it has helped any,      A trial of Sinemet did not help.  He is claustophobic and prefers not to do a DaTScan.    CT head was normal for age.   CT cervical showed some DJD with moderate foraminal narrowing  at  [facility] but no severe changes.    MRI of the brain 5/312023 was normal for age - mild atrophy and small vessel changes.  Nothing acute.    MRI of the cervical spine 08/30/2021 showed a normal spinal cord.  At [facility] he has mild spinal stenosis due to disc bulging and spondylosis.  Also foraminal narrowing.  A few small bulges elsewhere without sinal stenosis.    Bilateral carotid ultrasound showed less than 50% stenosis in the carotid arteries around the bulbs bilaterally.  The vertebral artery system had antegrade flow bilaterally.  Of note, BP was 188/97 at the time.  An x-ray of the cervical spine showed mild retrolisthesis of C3 upon C4 due to spondylosis.  There is foraminal narrowing at this level bilaterally.  Lesser foraminal narrowing is noted at C6-C7 due to degenerative changes.     TREMOR AND NEUROPATHY HISTORY: He has had progressively worsening tremor since 2016 on his left side only.  Initially, only  the hand was involved and he noted worsening handwriting.   He noted the tremor in his leg a year or two later.    Tremor is worse if with intention and is a little worse if more tense.   He has noted some improvement with relaxing the left shoulder/back. He has been on metoprolol for a few years and did not note any improvement when he started.   Scotch helps some.      He has had numbness in his feet for several years and saw Dr. Jaynee Eagles once in 2019.  He has right > left leg edema.    He feels the numbness and foot/ankle/lower leg swelling have both worsened.   In 2019, SPEP/IEF, SSA/SSB, RF, thiamine, ESR were fine.   He is on metoprolol 25 mg po bid for his heart.   Primidone was tried June 2021.    REVIEW OF SYSTEMS: Constitutional: No fevers, chills, sweats, or change in appetite Eyes: No visual changes, double vision, eye pain Ear, nose and throat: No hearing loss, ear pain, nasal congestion, sore throat Cardiovascular: No chest pain, palpitations Respiratory:  No shortness of breath at rest or with exertion.   No wheezes GastrointestinaI: No nausea, vomiting, diarrhea, abdominal pain, fecal incontinence Genitourinary:  No dysuria, urinary retention or frequency.  No nocturia. Musculoskeletal:  No neck pain, back pain Integumentary: No rash, pruritus, skin lesions Neurological: as above Psychiatric: No depression at this time.  No anxiety Endocrine: No palpitations, diaphoresis, change in appetite, change in weigh or increased thirst Hematologic/Lymphatic:  No anemia, purpura, petechiae. Allergic/Immunologic: No itchy/runny eyes, nasal congestion, recent allergic reactions, rashes  ALLERGIES: No Known Allergies  HOME MEDICATIONS:  Current Outpatient Medications:    aspirin 81 MG EC tablet, 1 tablet, Disp: , Rfl:    Cholecalciferol (VITAMIN D3) 50 MCG (2000 UT) capsule, Take 2,000 Units by mouth daily., Disp: , Rfl:    Coenzyme Q10 (CO Q 10) 100 MG CAPS, See admin instructions.,  Disp: , Rfl:    latanoprost (XALATAN) 0.005 % ophthalmic solution, Place 1 drop into the left eye daily., Disp: 2.5 mL, Rfl: 3   metoprolol tartrate (LOPRESSOR) 25 MG tablet, TAKE 1 TABLET TWICE A DAY, Disp: 180 tablet, Rfl: 3   Multiple Vitamin (MULTIVITAMIN) capsule, Take 1 capsule by mouth daily., Disp: , Rfl:    Omega-3 Fatty Acids (FISH OIL PO), Take 1,290 mg by mouth daily., Disp: , Rfl:    pantoprazole (PROTONIX) 40 MG tablet, 1 tablet, Disp: , Rfl:  pravastatin (PRAVACHOL) 20 MG tablet, Take 1 tablet (20 mg total) by mouth daily., Disp: 90 tablet, Rfl: 2   telmisartan-hydrochlorothiazide (MICARDIS HCT) 40-12.5 MG tablet, TAKE 1 TABLET DAILY, Disp: 90 tablet, Rfl: 3   albuterol (VENTOLIN HFA) 108 (90 Base) MCG/ACT inhaler, 1 puff as needed (Patient not taking: Reported on 08/02/2021), Disp: , Rfl:    carbidopa-levodopa (SINEMET IR) 25-100 MG tablet, Take 1 tablet by mouth 4 (four) times daily., Disp: 360 tablet, Rfl: 1  PAST MEDICAL HISTORY: Past Medical History:  Diagnosis Date   Arthritis    GERD (gastroesophageal reflux disease)    Glaucoma    Hypertension    Osteoarthritis of knee    Painful urination    Peripheral vascular disease ([place])    bilateral edema     PAST SURGICAL HISTORY: Past Surgical History:  Procedure Laterality Date   CARDIAC CATHETERIZATION  04/2017   CATARACT EXTRACTION, BILATERAL Bilateral    Left: 10/30/21, right: 11/20/21.   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   TONSILLECTOMY     TOTAL KNEE ARTHROPLASTY Left 10/08/2016   Procedure: LEFT TOTAL KNEE ARTHROPLASTY;  Surgeon: Gaynelle Arabian, MD;  Location: [facility];  Service: Orthopedics;  Laterality: Left;   TOTAL KNEE ARTHROPLASTY Right 06/17/2017   Procedure: RIGHT TOTAL KNEE ARTHROPLASTY;  Surgeon: Gaynelle Arabian, MD;  Location: [facility];  Service: Orthopedics;  Laterality: Right;    FAMILY HISTORY: Family History  Problem Relation Age of Onset   Breast cancer Mother 30   Prostate cancer Father    Breast cancer Sister 93   Parkinson's disease Sister    Healthy Daughter    Neuropathy Neg Hx     SOCIAL HISTORY:  Social History   Socioeconomic History   Marital status: Married    Spouse name: Not on file   Number of children: 2   Years of education: Not on file   Highest education level: Bachelor's degree (e.g., BA, AB, BS)  Occupational History   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[employer]   Smoking status: Former    Packs/day: 1.00    Years: 20.00    Total pack years: 20.00    Types: Cigarettes    Quit date: 10/1987    Years since quitting: 34.1   Smokeless tobacco: Never   Tobacco comments:    Quit 34 years ago  Vaping Use   Vaping Use: Never used  Substance and Sexual Activity   Alcohol use: Yes    Alcohol/week: 7.0 standard drinks of alcohol    Types: 7 Glasses of wine per week    Comment: daily, 2-3 glasses scotch per day   Drug use: No   Sexual activity: Yes  Other Topics Concern   Not on file  Social History Narrative   Lives at home with his wife   Left handed   Drinks 2-3 cups  of caffeine daily (coffee)   Retired   Investment banker, 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  ORTHOSTATIC VITALS: SUPINE    138/80   72 bpm SITTING    135/80   76 Stand (30s)   125/75    76 Stand (72m   120/75  80    General: The patient is well-developed and well-nourished and in no acute distress  HEENT:  Head is [place]/AT.  Sclera are anicteric.  Neck has good range of motion.  Skin: Extremities are without rash or  edema.  Neurologic Exam  Mental status:  The patient is alert and oriented x 3 at the time of the examination. The patient has apparent normal recent and remote memory, with an apparently normal attention span and concentration ability.   Speech is normal.  Cranial nerves: Extraocular movements are full.  Facial strength and sensation was normal.   No dysarthria is noted. No obvious hearing deficits are noted.  Motor: He does not have bradykinesia.  He has a rapid tremor in the left hand that is worse with intention but present at rest.  There is some distraction.  There is no tremor in the right arm.  There is a mild tremor in the left.  Muscle bulk is normal.   Tone is very slightly increased in the left arm.. Strength is  5 / 5 in all 4 extremities except 4+/5 EHL strength in the feet.   Sensory: Sensory testing is intact to pinprick, soft touch and vibration sensation in the arms but reduced pinprick and vibration in the foot.  Vibration sense fairy normal at ankle (10% great toe and 50% distal foot).  Coordination: Cerebellar testing reveals good finger-nose-finger and heel-to-shin bilaterally.  Gait and station: Station is normal.  Gait and tandem gait are normal for age.  He has no retropulsion Romberg is negative.   Reflexes: Deep tendon reflexes are symmetric and normal bilaterally.     DIAGNOSTIC DATA (LABS, IMAGING, TESTING) - I  reviewed patient records, labs, notes, testing and imaging myself where available.  Lab Results  Component Value Date   WBC 8.4 05/15/2021   HGB 15.2 05/15/2021   HCT 43.4 05/15/2021   MCV 98.0 05/15/2021   PLT 179 05/15/2021      Component Value Date/Time   NA 134 (L) 05/15/2021 1625   NA 140 07/11/2020 0929   K 3.8 05/15/2021 1625   CL 96 (L) 05/15/2021 1625   CO2 28 05/15/2021 1625   GLUCOSE 113 (H) 05/15/2021 1625   BUN 13 05/15/2021 1625   BUN 15 07/11/2020 0929   CREATININE 0.88 05/15/2021 1625   CALCIUM 9.2 05/15/2021 1625   PROT 7.4 05/15/2021 1625   PROT 7.1 07/11/2020 0929   ALBUMIN 4.2 05/15/2021 1625   ALBUMIN 4.5 07/11/2020 0929   AST 26 05/15/2021 1625   ALT 22 05/15/2021 1625   ALKPHOS 29 (L) 05/15/2021 1625   BILITOT 1.9 (H) 05/15/2021 1625   BILITOT 1.2 07/11/2020 0929   GFRNONAA >60 05/15/2021 1625  GFRAA 92 05/28/2019 1113   Lab Results  Component Value Date   CHOL 143 07/11/2020   HDL 51 07/11/2020   LDLCALC 78 07/11/2020   TRIG 73 07/11/2020   CHOLHDL 2.8 07/11/2020        ASSESSMENT AND PLAN  Parkinson's disease (HCC)  Episodic lightheadedness  Neck pain  Polyneuropathy  1.  Consider compression stockings for the episodes of lightheadedness.  If lightheadedness spells worsen consider fludrocortisone. 2.   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   I recommend we obtain the DAT scan  and he will contact a technician for a dry run.     He will also go up to 4 Sinemet pills a day.    3.    Neuropathy has numbness but no pain.  Evaluation for B12 and MGUS was negative.   4.   He will return in 6 months but call sooner if he has new or worsening symptoms.   Kenith Trickel A. Felecia Shelling, MD, [facility] 09/15/4882, 57:33 PM Certified in Neurology, Clinical Neurophysiology, Sleep Medicine and Neuroimaging  [facility] [address], [place], [place] [address] [phone]

## 2021-12-07 ENCOUNTER — Other Ambulatory Visit: Payer: Self-pay

## 2021-12-07 DIAGNOSIS — I739 Peripheral vascular disease, unspecified: Secondary | ICD-10-CM

## 2021-12-15 ENCOUNTER — Telehealth: Payer: Self-pay | Admitting: Cardiology

## 2021-12-15 NOTE — Telephone Encounter (Signed)
Pt would like to do a provider Switch due to location  From: [facility]  To: [place]   Best number is [phone]

## 2021-12-25 ENCOUNTER — Other Ambulatory Visit: Payer: Self-pay | Admitting: Cardiology

## 2021-12-27 DIAGNOSIS — Z23 Encounter for immunization: Secondary | ICD-10-CM | POA: Diagnosis not present

## 2022-01-10 DIAGNOSIS — Z23 Encounter for immunization: Secondary | ICD-10-CM | POA: Diagnosis not present

## 2022-01-18 ENCOUNTER — Ambulatory Visit: Payer: Medicare Other | Admitting: Podiatrist

## 2022-01-19 ENCOUNTER — Ambulatory Visit: Payer: Medicare Other | Admitting: Podiatrist

## 2022-01-25 ENCOUNTER — Encounter: Payer: Self-pay | Admitting: Cardiology

## 2022-01-25 ENCOUNTER — Ambulatory Visit: Payer: Medicare Other | Attending: Cardiology | Admitting: Cardiology

## 2022-01-25 VITALS — BP 134/68 | HR 89 | Ht 72.0 in | Wt 292.0 lb

## 2022-01-25 DIAGNOSIS — I719 Aortic aneurysm of unspecified site, without rupture: Secondary | ICD-10-CM

## 2022-01-25 DIAGNOSIS — E8881 Metabolic syndrome: Secondary | ICD-10-CM | POA: Diagnosis not present

## 2022-01-25 DIAGNOSIS — Z1329 Encounter for screening for other suspected endocrine disorder: Secondary | ICD-10-CM | POA: Diagnosis not present

## 2022-01-25 DIAGNOSIS — Z01812 Encounter for preprocedural laboratory examination: Secondary | ICD-10-CM | POA: Insufficient documentation

## 2022-01-25 DIAGNOSIS — E559 Vitamin D deficiency, unspecified: Secondary | ICD-10-CM

## 2022-01-25 MED ORDER — METOPROLOL TARTRATE 25 MG PO TABS: 25.0000 mg | ORAL_TABLET | Freq: Two times a day (BID) | ORAL | 3 refills | Status: DC

## 2022-01-25 MED ORDER — PRAVASTATIN SODIUM 20 MG PO TABS: 20.0000 mg | ORAL_TABLET | Freq: Every day | ORAL | 3 refills | Status: DC

## 2022-01-25 MED ORDER — TELMISARTAN-HCTZ 40-12.5 MG PO TABS: 1.0000 | ORAL_TABLET | Freq: Every day | ORAL | 3 refills | Status: DC

## 2022-01-25 NOTE — Patient Instructions (Addendum)
Medication Instructions:  Your physician recommends that you continue on your current medications as directed. Please refer to the Current Medication list given to you today.  *If you need a refill on your cardiac medications before your next appointment, please call your pharmacy*   Lab Work: Your physician recommends that you have the following labs drawn today: TSH and Vitamin D  If you have labs (blood work) drawn today and your tests are completely normal, you will receive your results only by: [place] (if you have MyChart) OR A paper copy in the mail If you have any lab test that is abnormal or we need to change your treatment, we will call you to review the results.   Testing/Procedures: Your physician recommends that you have an CTA of your Chest/Aorta. This will take place at [facility]. Someone from their office will give you a call to get you scheduled.     Follow-Up: At [facility], you and your health needs are our priority.  As part of our continuing mission to provide you with exceptional heart care, we have created designated Provider Care Teams.  These Care Teams include your primary Cardiologist (physician) and Advanced Practice Providers (APPs -  Physician Assistants and Nurse Practitioners) who all work together to provide you with the care you need, when you need it.  We recommend signing up for the patient portal called "MyChart".  Sign up information is provided on this After Visit Summary.  MyChart is used to connect with patients for Virtual Visits (Telemedicine).  Patients are able to view lab/test results, encounter notes, upcoming appointments, etc.  Non-urgent messages can be sent to your provider as well.   To learn more about what you can do with MyChart, go to [email or web address].    Your next appointment:   9 month(s)  The format for your next appointment:   In Person  Provider:   Berniece Salines, DO

## 2022-01-25 NOTE — Progress Notes (Signed)
Cardiology Office Note:    Date:  01/25/2022   ID:  Todd Chan, DOB 11/02/1945, MRN [id number]  PCP:  Todd Bishop, DO  Cardiologist:  Todd Salines, DO  Electrophysiologist:  None   Referring MD: Todd Bishop, DO   'I am ok"  History of Present Illness:    Todd Chan is a 76 y.o. male with a hx of mild CAD with 50% proximal LAD stenosis and normal left ventricular function at coronary angiography in 2019 hypertension mild enlargement ascending aorta stable at 41 mm 06/28/2020 last seen 07/11/2020.  He previously follow with Dr. Bettina Chan but is not being transitioned under my care due to location. He is here today with his wife Todd Chan/ He prefers to be called Todd Chan.   Since he saw Dr. Bettina Chan he had seen the neurologist and is under medical therapy for his parkinson disease.   He has had some upper lip numbness.   Past Medical History:  Diagnosis Date   Arthritis    GERD (gastroesophageal reflux disease)    Glaucoma    Hypertension    Osteoarthritis of knee    Painful urination    Peripheral vascular disease ([place])    bilateral edema     Past Surgical History:  Procedure Laterality Date   CARDIAC CATHETERIZATION  04/2017   CATARACT EXTRACTION, BILATERAL Bilateral    Left: 10/30/21, right: 11/20/21.   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   KNEE ARTHROSCOPY Left 6-7 years ago   LEFT HEART CATH AND CORONARY ANGIOGRAPHY N/A 04/17/2017   Procedure: LEFT HEART CATH AND CORONARY ANGIOGRAPHY;  Surgeon: Troy Sine, MD;  Location: [facility];  Service: Cardiovascular;  Laterality: N/A;   RADIOLOGY WITH ANESTHESIA N/A 08/30/2020   Procedure: MRI WITH ANESTHESIA BRAIN WITH AND WITHOUT AND MRI CERVICAL SPINE WITH AND WITHOUT;  Surgeon: Radiologist, Medication, MD;  Location: [facility];  Service: Radiology;  Laterality: N/A;   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   TONSILLECTOMY     TOTAL KNEE  ARTHROPLASTY Left 10/08/2016   Procedure: LEFT TOTAL KNEE ARTHROPLASTY;  Surgeon: Gaynelle Arabian, MD;  Location: [facility];  Service: Orthopedics;  Laterality: Left;   TOTAL KNEE ARTHROPLASTY Right 06/17/2017   Procedure: RIGHT TOTAL KNEE ARTHROPLASTY;  Surgeon: Gaynelle Arabian, MD;  Location: [facility];  Service: Orthopedics;  Laterality: Right;    Current Medications: Current Meds  Medication Sig   aspirin 81 MG EC tablet 1 tablet   carbidopa-levodopa (SINEMET IR) 25-100 MG tablet Take 1 tablet by mouth 4 (four) times daily.   Cholecalciferol (VITAMIN D3) 50 MCG (2000 UT) capsule Take 2,000 Units by mouth daily.   Coenzyme Q10 (CO Q 10) 100 MG CAPS See admin instructions.   latanoprost (XALATAN) 0.005 % ophthalmic solution Place 1 drop into the left eye daily.   Multiple Vitamin (MULTIVITAMIN) capsule Take 1 capsule by mouth daily.   Omega-3 Fatty Acids (FISH OIL PO) Take 1,290 mg by mouth daily.   pantoprazole (PROTONIX) 40 MG tablet 1 tablet   [DISCONTINUED] metoprolol tartrate (LOPRESSOR) 25 MG tablet TAKE 1 TABLET TWICE A DAY   [DISCONTINUED] pravastatin (PRAVACHOL) 20 MG tablet Take 1 tablet (20 mg total) by mouth daily.   [DISCONTINUED] telmisartan-hydrochlorothiazide (MICARDIS HCT) 40-12.5 MG tablet TAKE 1 TABLET DAILY     Allergies:   Patient has no known allergies.   Social History   Socioeconomic History   Marital status: Married  Spouse name: Not on file   Number of children: 2   Years of education: Not on file   Highest education level: Bachelor's degree (e.g., BA, AB, BS)  Occupational History   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[employer]   Smoking status: Former    Packs/day: 1.00    Years: 20.00    Total pack years: 20.00    Types: Cigarettes    Quit date: 10/1987    Years since quitting: 34.3   Smokeless tobacco: Never   Tobacco comments:    Quit 34 years ago  Vaping Use   Vaping Use: Never used  Substance and Sexual  Activity   Alcohol use: Yes    Alcohol/week: 7.0 standard drinks of alcohol    Types: 7 Glasses of wine per week    Comment: daily, 2-3 glasses scotch per day   Drug use: No   Sexual activity: Yes  Other Topics Concern   Not on file  Social History Narrative   Lives at home with his wife   Left handed   Drinks 2-3 cups of caffeine daily (coffee)   Retired   Investment banker, 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 There is no history of Neuropathy.  ROS:   Review of Systems  Constitution: Negative for decreased appetite, fever and weight gain.  HENT: Negative for congestion, ear discharge, hoarse voice and sore throat.   Eyes: Negative for discharge, redness, vision loss in right eye and visual halos.  Cardiovascular: Negative for chest pain, dyspnea on exertion, leg swelling, orthopnea and palpitations.  Respiratory: Negative for cough, hemoptysis, shortness of breath and snoring.   Endocrine: Negative for heat intolerance and polyphagia.  Hematologic/Lymphatic: Negative for bleeding problem. Does not bruise/bleed easily.  Skin: Negative for flushing, nail changes, rash and suspicious lesions.  Musculoskeletal: Negative for arthritis, joint pain, muscle cramps, myalgias, neck pain and stiffness.  Gastrointestinal: Negative for abdominal pain, bowel incontinence, diarrhea and excessive appetite.  Genitourinary: Negative for decreased libido, genital sores and incomplete emptying.  Neurological: Negative for brief paralysis, focal weakness, headaches and loss of balance.   Psychiatric/Behavioral: Negative for altered mental status, depression and suicidal ideas.  Allergic/Immunologic: Negative for HIV exposure and persistent infections.    EKGs/Labs/Other Studies Reviewed:    The following studies were reviewed today:   EKG:  The ekg ordered today demonstrates sinus rhythm  CT chest 05/2020 IMPRESSION: 1. Stable appearance of ascending thoracic aorta with mild aneurysmal dilation. Recommend annual imaging followup by CTA or MRA. This recommendation follows 2010 ACCF/AHA/AATS/ACR/ASA/SCA/SCAI/SIR/STS/SVM Guidelines for the Diagnosis and Management of Patients with Thoracic Aortic Disease. Circulation. 2010; 121: E938-B017. Aortic aneurysm NOS (ICD10-I71.9) 2. Signs of coronary artery disease as before. 3. Cholelithiasis. 4. Lobular hepatic contours, correlate with any clinical or laboratory evidence of liver disease. 5. Aortic atherosclerosis.   Aortic Atherosclerosis (ICD10-I70.0).     Electronically Signed   By: Zetta Bills M.D.   On: 06/28/2020 15:32  LHC 04/2017 Ost LAD to Prox LAD lesion is 15% stenosed.  Prox LAD lesion is 10% stenosed. The left ventricular systolic function is normal. LV end diastolic pressure is normal. The left ventricular ejection fraction is 55-65% by visual estimate.   Normal LV function without focal wall motion abnormalities.  EF 55-60%.   No significant coronary obstructive disease, although there is a small area of calcification superior to the ostium of the a short left main, 10-15% luminal irregularity of the proximal LAD and a normal ramus intermediate, left circumflex, and dominant RCA.   RECOMMENDATION: Medical therapy.  Suspect attenuation artifact contributing to the patient's abnormal nuclear stress test.  The patient will return to Dr. Shirlee More and will be given preoperative cardiac clearance.    Recent Labs: 05/15/2021: ALT 22; BUN 13; Creatinine, Ser 0.88; Hemoglobin 15.2; Magnesium 2.0;  Platelets 179; Potassium 3.8; Sodium 134  Recent Lipid Panel    Component Value Date/Time   CHOL 143 07/11/2020 0929   TRIG 73 07/11/2020 0929   HDL 51 07/11/2020 0929   CHOLHDL 2.8 07/11/2020 0929   LDLCALC 78 07/11/2020 0929    Physical Exam:    VS:  BP 134/68   Pulse 89   Ht 6' (1.829 m)   Wt 292 lb (132.5 kg)   SpO2 93%   BMI 39.60 kg/m     Wt Readings from Last 3 Encounters:  01/25/22 292 lb (132.5 kg)  11/30/21 293 lb 6.4 oz (133.1 kg)  08/02/21 275 lb 12.8 oz (125.1 kg)     GEN: Well nourished, well developed in no acute distress HEENT: Normal NECK: No JVD; No carotid bruits LYMPHATICS: No lymphadenopathy CARDIAC: S1S2 noted,RRR, no murmurs, rubs, gallops RESPIRATORY:  Clear to auscultation without rales, wheezing or rhonchi  ABDOMEN: Soft, non-tender, non-distended, +bowel sounds, no guarding. EXTREMITIES: No edema, No cyanosis, no clubbing MUSCULOSKELETAL:  No deformity  SKIN: Warm and dry NEUROLOGIC:  Alert and oriented x 3, non-focal PSYCHIATRIC:  Normal affect, good insight  ASSESSMENT:    1. Aortic aneurysm without rupture, unspecified portion of aorta ([place])   2. Metabolic syndrome   3. Vitamin D deficiency   4. Screening for hypothyroidism   5. Pre-procedural laboratory examination    PLAN:    He appears to be doing well from a CV standpoint. He is due for his follow up Chest CTA so I will order this today.  I will get blood work for vitamin D and TSH to make sure this is not contributing to his symptoms noted.   CAD- no anginal Blood pressure at target Continue current statin dose Will send refills.  The patient is in agreement with the above plan. The patient left the office in stable condition.  The patient will follow up in   Medication Adjustments/Labs and Tests Ordered: Current medicines are reviewed at length with the patient today.  Concerns regarding medicines are outlined above.  Orders Placed This Encounter  Procedures   CT  ANGIO CHEST AORTA W/CM & OR WO/CM   TSH   VITAMIN D 25 Hydroxy (Vit-D Deficiency, Fractures)   Basic Metabolic Panel (BMET)   Meds ordered this encounter  Medications   metoprolol tartrate (LOPRESSOR) 25 MG tablet    Sig: Take 1 tablet (25 mg total) by mouth 2 (two) times daily.    Dispense:  180 tablet    Refill:  3   pravastatin (PRAVACHOL) 20 MG tablet    Sig: Take 1 tablet (20 mg total) by mouth daily.    Dispense:  90 tablet    Refill:  3   telmisartan-hydrochlorothiazide (MICARDIS HCT) 40-12.5 MG tablet    Sig: Take 1 tablet by mouth daily.    Dispense:  90 tablet    Refill:  3    Patient Instructions  Medication Instructions:  Your physician recommends that you continue on your current medications as directed. Please refer to the Current Medication list given to you today.  *If you need a refill on your cardiac medications before your next appointment, please call your pharmacy*   Lab Work: Your physician recommends that you have the following labs drawn today: TSH and Vitamin D  If you have labs (blood work) drawn today and your tests are completely normal, you will receive your results only by: [place] (if you have MyChart) OR A paper copy in the mail If you have any lab test that is abnormal or we need to change your treatment, we will call you to review the results.   Testing/Procedures: Your physician recommends that you have an CTA of your Chest/Aorta. This will take place at [facility]. Someone from their office will give you a call to get you scheduled.     Follow-Up: At [facility], you and your health needs are our priority.  As part of our continuing mission to provide you with exceptional heart care, we have created designated Provider Care Teams.  These Care Teams include your primary Cardiologist (physician) and Advanced Practice Providers (APPs -  Physician Assistants and Nurse Practitioners) who all work together to provide you  with the care you need, when you need it.  We recommend signing up for the patient portal called "MyChart".  Sign up information is provided on this After Visit Summary.  MyChart is used to connect with patients for Virtual Visits (Telemedicine).  Patients are able to view lab/test results, encounter notes, upcoming appointments, etc.  Non-urgent messages can be sent to your provider as well.   To learn more about what you can do with MyChart, go to [email or web address].    Your next appointment:   9 month(s)  The format for your next appointment:   In Person  Provider:   Berniece Salines, DO      Adopting a Healthy Lifestyle.  Know what a healthy weight is for you (roughly BMI <25) and aim to maintain this   Aim for 7+ servings of fruits and vegetables daily   65-80+ fluid ounces of water or unsweet tea for healthy kidneys   Limit to max 1 drink of alcohol per day; avoid smoking/tobacco   Limit animal fats in diet for cholesterol and heart health - choose grass fed whenever available   Avoid highly processed foods, and foods high in saturated/trans fats   Aim for low stress - take time to unwind and care for your mental health   Aim for 150 min of moderate intensity exercise weekly for heart health, and weights twice weekly for bone health   Aim for 7-9 hours of sleep daily   When it comes to diets, agreement about the perfect plan isnt easy to find, even among the experts. Experts at the [employer] developed an idea known as the Healthy Eating Plate. Just imagine a plate divided into logical, healthy portions.   The emphasis is on diet quality:   Load up on vegetables and fruits - one-half of your plate: Aim for color and variety, and remember that potatoes dont count.   Go for whole grains - one-quarter of your plate:  Whole wheat, barley, wheat berries, quinoa, oats, brown rice, and foods made with them. If you want pasta, go with whole wheat pasta.    Protein power - one-quarter of your plate: Fish, chicken, beans, and nuts are all healthy, versatile protein sources. Limit red meat.   The diet, however, does go beyond the plate, offering a few other suggestions.   Use healthy plant oils, such as olive, canola, soy, corn, sunflower and peanut. Check the labels, and avoid partially hydrogenated oil, which have unhealthy trans fats.   If youre thirsty, drink water. Coffee and tea are good in moderation, but skip sugary drinks and limit milk and dairy products to one or two daily servings.   The type of carbohydrate in the diet is more important than the amount. Some sources of carbohydrates, such as vegetables, fruits, whole grains, and beans-are healthier than others.   Finally, stay active  Signed, Todd Salines, DO  01/25/2022 12:54 PM    [facility]

## 2022-01-26 ENCOUNTER — Ambulatory Visit (INDEPENDENT_AMBULATORY_CARE_PROVIDER_SITE_OTHER): Payer: Medicare Other | Admitting: Podiatrist

## 2022-01-26 ENCOUNTER — Encounter: Payer: Self-pay | Admitting: Podiatrist

## 2022-01-26 DIAGNOSIS — M79674 Pain in right toe(s): Secondary | ICD-10-CM

## 2022-01-26 DIAGNOSIS — G609 Hereditary and idiopathic neuropathy, unspecified: Secondary | ICD-10-CM | POA: Diagnosis not present

## 2022-01-26 DIAGNOSIS — B351 Tinea unguium: Secondary | ICD-10-CM | POA: Diagnosis not present

## 2022-01-26 DIAGNOSIS — M79675 Pain in left toe(s): Secondary | ICD-10-CM | POA: Diagnosis not present

## 2022-01-26 LAB — BASIC METABOLIC PANEL
BUN/Creatinine Ratio: 16 (ref 10–24)
BUN: 16 mg/dL (ref 8–27)
CO2: 29 mmol/L (ref 20–29)
Calcium: 9.6 mg/dL (ref 8.6–10.2)
Chloride: 101 mmol/L (ref 96–106)
Creatinine, Ser: 0.98 mg/dL (ref 0.76–1.27)
Glucose: 115 mg/dL — ABNORMAL HIGH (ref 70–99)
Potassium: 4.2 mmol/L (ref 3.5–5.2)
Sodium: 141 mmol/L (ref 134–144)
eGFR: 81 mL/min/{1.73_m2} (ref >59)

## 2022-01-26 LAB — TSH: TSH: 1.43 u[IU]/mL (ref 0.450–4.500)

## 2022-01-26 NOTE — Progress Notes (Signed)
Chief Complaint  Patient presents with   Nail Problem     Routine foot care    Subjective: Todd Chan is a 76 y.o. male patient who presents to office today with concern of long,mildly painful nails  while ambulating in shoes; unable to trim.  Patient denies any new cramping, numbness, burning or tingling in the legs or feet.  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/13/2021   Personal history of colonic polyps 07/13/2021   Seasonal allergic rhinitis 07/13/2021   Primary open-angle glaucoma, left eye, mild stage 05/09/2021   Disequilibrium 09/27/2020   Cataract, nuclear sclerotic, both eyes 07/06/2020   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/2020   Tremor 01/21/2020   Polyneuropathy 01/21/2020   Stuffy ears, bilateral 01/21/2020   Otalgia of both ears 01/21/2020   Other headache syndrome 01/21/2020   Carotid stenosis, asymptomatic, bilateral 01/21/2020   Episodic lightheadedness 05/28/2019   Ascending aortic aneurysm (HCC) 4.2 cm on CT from January 2020 05/28/2019   Snoring 05/28/2019   Pleurisy 04/10/2018   Abnormal nuclear stress test    Exertional dyspnea    Abnormal myocardial perfusion study 04/11/2017   Chest pain in adult 03/18/2017   Edema 03/18/2017   Mild CAD 03/18/2017   Peripheral vascular disease ([facility])    Painful urination    Osteoarthritis of knee    Hypertension    Hyperlipidemia    GERD (gastroesophageal reflux disease)    Fibromyalgia    Arthritis    OA (osteoarthritis) of knee 10/08/2016   Current Outpatient Medications on File  Prior to Visit  Medication Sig Dispense Refill   aspirin 81 MG EC tablet 1 tablet     carbidopa-levodopa (SINEMET IR) 25-100 MG tablet Take 1 tablet by mouth 4 (four) times daily. 360 tablet 1   Cholecalciferol (VITAMIN D3) 50 MCG (2000 UT) capsule Take 2,000 Units by mouth daily.     Coenzyme Q10 (CO Q 10) 100 MG CAPS See admin instructions.     latanoprost (XALATAN) 0.005 % ophthalmic solution Place 1 drop into the left eye daily. 2.5 mL 3   metoprolol tartrate (LOPRESSOR) 25 MG tablet Take 1 tablet (25 mg total) by mouth 2 (two) times daily. 180 tablet 3   Multiple Vitamin (MULTIVITAMIN) capsule Take 1 capsule by mouth daily.     Omega-3 Fatty Acids (FISH OIL PO) Take 1,290 mg by mouth daily.     pantoprazole (PROTONIX) 40 MG tablet 1 tablet     pravastatin (PRAVACHOL) 20 MG tablet Take 1 tablet (20 mg total) by mouth daily. 90 tablet 3   telmisartan-hydrochlorothiazide (MICARDIS HCT) 40-12.5 MG tablet Take 1 tablet by mouth daily. 90 tablet 3   No current facility-administered medications on file prior to visit.   No Known Allergies   Objective:  Physical Exam: Vascular: DP/PT pulses 1/4 bilateral. CFT <3 seconds. Normal hair growth on digits. No edema. Multiple varicosities noted. Skin. No lacerations or abrasions bilateral feet. Nails 1-5 are thickened discolored and elongated with subungual debris.  Left hallux nail bruise under nail is almost all grown out.  Musculoskeletal: MMT 5/5 bilateral lower extremities in  DF, PF, Inversion and Eversion. Deceased ROM in DF of ankle joint. No pain to palpation.  Neurological: Sensation diminished to light touch. Protective sensation present to all 10 locations with SWMF.   Assessment and Plan: Problem List Items Addressed This Visit   None   -Examined patient. -Mechanically debrided all nails 1-5 bilateral using sterile nail nipper and filed with sterile dremel without incident  -Answered all patient questions -Patient to return  in 3  months for continued foot care.  -Patient advised to call the office if any problems or questions arise in the meantime.  Bronson Ing, DPM

## 2022-02-01 ENCOUNTER — Ambulatory Visit: Payer: Medicare Other | Admitting: Podiatrist

## 2022-02-08 ENCOUNTER — Ambulatory Visit (HOSPITAL_BASED_OUTPATIENT_CLINIC_OR_DEPARTMENT_OTHER)
Admission: RE | Admit: 2022-02-08 | Discharge: 2022-02-08 | Disposition: A | Discharge status: Home / Self Care | Payer: Medicare Other | Source: Ambulatory Visit | Attending: Cardiology | Admitting: Cardiology

## 2022-02-08 DIAGNOSIS — I712 Thoracic aortic aneurysm, without rupture, unspecified: Secondary | ICD-10-CM | POA: Diagnosis not present

## 2022-02-08 DIAGNOSIS — I2510 Atherosclerotic heart disease of native coronary artery without angina pectoris: Secondary | ICD-10-CM | POA: Diagnosis not present

## 2022-02-08 DIAGNOSIS — J984 Other disorders of lung: Secondary | ICD-10-CM | POA: Diagnosis not present

## 2022-02-08 DIAGNOSIS — I719 Aortic aneurysm of unspecified site, without rupture: Secondary | ICD-10-CM | POA: Insufficient documentation

## 2022-02-08 DIAGNOSIS — I700 Atherosclerosis of aorta: Secondary | ICD-10-CM | POA: Diagnosis not present

## 2022-02-08 MED ORDER — IOHEXOL 350 MG/ML SOLN
80.0000 mL | Freq: Once | INTRAVENOUS | Status: AC | PRN
  Administered 2022-02-08: 75 mL via INTRAVENOUS

## 2022-02-12 ENCOUNTER — Encounter (INDEPENDENT_AMBULATORY_CARE_PROVIDER_SITE_OTHER): Payer: Self-pay

## 2022-02-12 ENCOUNTER — Encounter (INDEPENDENT_AMBULATORY_CARE_PROVIDER_SITE_OTHER): Payer: Medicare Other | Admitting: Ophthalmology

## 2022-02-12 DIAGNOSIS — H401121 Primary open-angle glaucoma, left eye, mild stage: Secondary | ICD-10-CM | POA: Diagnosis not present

## 2022-02-12 DIAGNOSIS — H34812 Central retinal vein occlusion, left eye, with macular edema: Secondary | ICD-10-CM | POA: Diagnosis not present

## 2022-02-14 DIAGNOSIS — K219 Gastro-esophageal reflux disease without esophagitis: Secondary | ICD-10-CM | POA: Diagnosis not present

## 2022-02-14 DIAGNOSIS — G629 Polyneuropathy, unspecified: Secondary | ICD-10-CM | POA: Diagnosis not present

## 2022-02-14 DIAGNOSIS — I10 Essential (primary) hypertension: Secondary | ICD-10-CM | POA: Diagnosis not present

## 2022-02-14 DIAGNOSIS — G250 Essential tremor: Secondary | ICD-10-CM | POA: Diagnosis not present

## 2022-02-14 DIAGNOSIS — R42 Dizziness and giddiness: Secondary | ICD-10-CM | POA: Diagnosis not present

## 2022-02-28 DIAGNOSIS — K219 Gastro-esophageal reflux disease without esophagitis: Secondary | ICD-10-CM | POA: Diagnosis not present

## 2022-03-15 ENCOUNTER — Ambulatory Visit: Payer: Medicare Other | Admitting: Podiatrist

## 2022-04-26 ENCOUNTER — Encounter: Payer: Self-pay | Admitting: Podiatry

## 2022-04-26 ENCOUNTER — Ambulatory Visit (INDEPENDENT_AMBULATORY_CARE_PROVIDER_SITE_OTHER): Payer: Medicare Other | Admitting: Podiatry

## 2022-04-26 DIAGNOSIS — I739 Peripheral vascular disease, unspecified: Secondary | ICD-10-CM | POA: Diagnosis not present

## 2022-04-26 DIAGNOSIS — B351 Tinea unguium: Secondary | ICD-10-CM

## 2022-04-26 DIAGNOSIS — M79675 Pain in left toe(s): Secondary | ICD-10-CM

## 2022-04-26 DIAGNOSIS — G609 Hereditary and idiopathic neuropathy, unspecified: Secondary | ICD-10-CM | POA: Diagnosis not present

## 2022-04-26 DIAGNOSIS — M79674 Pain in right toe(s): Secondary | ICD-10-CM

## 2022-04-26 NOTE — Progress Notes (Signed)
  Subjective:  Patient ID: Todd Chan, male    DOB: [birth year],   MRN: [id number]  Chief Complaint  Patient presents with   Nail Problem    Routine Foot Care     77 y.o. male presents for concern of painful thickened and elongated toenails with a history of PVD and neuropathy. Denies diabetes. Hoping to have nails trimmed as well.  . Denies any other pedal complaints. Denies n/v/f/c.   PCP: Crissie Sickles DO   Past Medical History:  Diagnosis Date   Arthritis    GERD (gastroesophageal reflux disease)    Glaucoma    Hypertension    Osteoarthritis of knee    Painful urination    Peripheral vascular disease ([place])    bilateral edema     Objective:  Physical Exam: Vascular: DP/PT pulses 1/4 bilateral. CFT <3 seconds. Normal hair growth on digits. No edema.  Skin. No lacerations or abrasions bilateral feet. Nails 1-5 are thickened discolored and elongated with subungual debris.  Musculoskeletal: MMT 5/5 bilateral lower extremities in DF, PF, Inversion and Eversion. Deceased ROM in DF of ankle joint. No pain to palpation.  Neurological: Sensation diminished to light touch. Protective sensation present to all 10 locations with SWMF.   Assessment:   1. Pain due to onychomycosis of toenails of both feet   2. Idiopathic peripheral neuropathy   3. Peripheral vascular disease ([place])       Plan:  Patient was evaluated and treated and all questions answered. Discussed neuropathy and etiology as well as treatment with patient.  -Discussed and educated patient on foot care, especially with  regards to the vascular, neurological and musculoskeletal systems.  -Discussed supportive shoes at all times and checking feet regularly.  -Nails 1-5 b/l debrided today without incident.  -Patient to return in 3 months for at risk foot care.    Lorenda Peck, DPM

## 2022-05-14 DIAGNOSIS — H34812 Central retinal vein occlusion, left eye, with macular edema: Secondary | ICD-10-CM | POA: Diagnosis not present

## 2022-05-14 DIAGNOSIS — H401121 Primary open-angle glaucoma, left eye, mild stage: Secondary | ICD-10-CM | POA: Diagnosis not present

## 2022-05-19 ENCOUNTER — Other Ambulatory Visit: Payer: Self-pay | Admitting: Neurology

## 2022-05-21 NOTE — Telephone Encounter (Signed)
Patient last seen on 11/30/2021 Follow up scheduled on 06/07/22

## 2022-06-07 ENCOUNTER — Encounter: Payer: Self-pay | Admitting: Neurology

## 2022-06-07 ENCOUNTER — Ambulatory Visit (INDEPENDENT_AMBULATORY_CARE_PROVIDER_SITE_OTHER): Payer: Medicare Other | Admitting: Neurology

## 2022-06-07 VITALS — BP 163/88 | HR 71 | Ht 72.0 in | Wt 300.6 lb

## 2022-06-07 DIAGNOSIS — G20A1 Parkinson's disease without dyskinesia, without mention of fluctuations: Secondary | ICD-10-CM | POA: Diagnosis not present

## 2022-06-07 DIAGNOSIS — M542 Cervicalgia: Secondary | ICD-10-CM

## 2022-06-07 DIAGNOSIS — R251 Tremor, unspecified: Secondary | ICD-10-CM

## 2022-06-07 MED ORDER — AMANTADINE HCL 100 MG PO CAPS: 100.0000 mg | ORAL_CAPSULE | Freq: Two times a day (BID) | ORAL | 5 refills | Status: DC

## 2022-06-07 NOTE — Progress Notes (Signed)
[facility]  PATIENT: Todd Chan DOB: [birth year]  REFERRING DOCTOR OR PCP:  Crissie Sickles, DO SOURCE: patient, notes from Dr. Jaynee Eagles and PCP  _________________________________   HISTORICAL  CHIEF COMPLAINT:  Chief Complaint  Patient presents with   Follow-up    RM 1 with wife. Last seen 05/17/21.  Lightheadedness ongoing, neuropathy stable. No falls.  Had cataract surgery 10/30/21 (left eye) and 11/20/21 (right eye). Still currently doing eye drops.    HISTORY OF PRESENT ILLNESS:  Todd Chan is a 77 y.o. man with spells of lightheadedness, nausea, tremors and neuropathy.    Update 06/07/2022: He continues to have spells of lightheadedness - usually right after standing up, espeically if sitting longer time.  These spells last 30-60 seconds.    We had him try mestinon but it did not help so he stopped.      He has HTN but no known cardiac issues.      He has a tremor in hs hands.    He does not think he got much benefit from the Sinemet as far as the tremor.   Current dose is 25/100 qid.    He has seen Dr. Carles Collet in the past.   THe tremor is left > right and is present at rest but appears higher frequency with intention.     Tremor s worse with nervous.   Unclear if element of essential tremor Primidone was tried but he had headaches and lightheaded and stopped.  It did not seem to help noticeably.    Ativan had npt helped.  He is also on metoprolol 25 mg po bid (sees cardiology for an aortic tear) but does not feel it has helped any,      The tremor gets better after 2 drinks (1.5 ounce shots and [place] over ice).    More recently he has had tremor in the legs    He is claustophobic and prefers not to do a DaTScan.    He has lightheadedness upon standing  - this is worse if sitting a while.   He also feels more lightheaded after he bends over (to tie shoes).   This usually occurs for a minute or 2 and may also occur if he is standing a while.  He feels  pressure in his ears while this is occurring.    He tried compression stockings but has not given it a good try.    He has some head pain, helped by BioFreeze.  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    He no longer has nausea.     Pain is left > right most of the time but is greater on the right today..   Neck is sometimes stiff and there is upper neck pain/occipital pain.   Most of the time the headaches only last for an hour or so.   When he had nausea. Ondansetron had not helped.   Tylenol has not helped much.     CT head was normal for age.   CT cervical showed some DJD with moderate foraminal narrowing at C3C4 but no severe changes.    MRI of the brain 5/312023 was normal for age - mild atrophy and small vessel changes.  Nothing acute.    MRI of the cervical spine 08/30/2021 showed a normal spinal cord.  At [facility] he has mild spinal stenosis due to disc bulging and spondylosis.  Also foraminal narrowing.  A  few small bulges elsewhere without sinal stenosis.    Bilateral carotid ultrasound showed less than 50% stenosis in the carotid arteries around the bulbs bilaterally.  The vertebral artery system had antegrade flow bilaterally.  Of note, BP was 188/97 at the time.  An x-ray of the cervical spine showed mild retrolisthesis of C3 upon C4 due to spondylosis.  There is foraminal narrowing at this level bilaterally.  Lesser foraminal narrowing is noted at C6-C7 due to degenerative changes.     TREMOR AND NEUROPATHY HISTORY: He has had progressively worsening tremor since 2016 on his left side only.  Initially, only the hand was involved and he noted worsening handwriting.   He noted the tremor in his leg a year or two later.    Tremor is worse if with intention and is a little worse if more tense.   He has noted some improvement with relaxing the left shoulder/back. He has been on metoprolol for a few years and did not note any improvement when he  started.   Scotch helps some.      He has had numbness in his feet for several years and saw Dr. Jaynee Eagles once in 2019.  He has right > left leg edema.    He feels the numbness and foot/ankle/lower leg swelling have both worsened.   In 2019, SPEP/IEF, SSA/SSB, RF, thiamine, ESR were fine.   He is on metoprolol 25 mg po bid for his heart.   Primidone was tried June 2021.    REVIEW OF SYSTEMS: Constitutional: No fevers, chills, sweats, or change in appetite Eyes: No visual changes, double vision, eye pain Ear, nose and throat: No hearing loss, ear pain, nasal congestion, sore throat Cardiovascular: No chest pain, palpitations Respiratory:  No shortness of breath at rest or with exertion.   No wheezes GastrointestinaI: No nausea, vomiting, diarrhea, abdominal pain, fecal incontinence Genitourinary:  No dysuria, urinary retention or frequency.  No nocturia. Musculoskeletal:  No neck pain, back pain Integumentary: No rash, pruritus, skin lesions Neurological: as above Psychiatric: No depression at this time.  No anxiety Endocrine: No palpitations, diaphoresis, change in appetite, change in weigh or increased thirst Hematologic/Lymphatic:  No anemia, purpura, petechiae. Allergic/Immunologic: No itchy/runny eyes, nasal congestion, recent allergic reactions, rashes  ALLERGIES: No Known Allergies  HOME MEDICATIONS:  Current Outpatient Medications:    aspirin 81 MG EC tablet, 1 tablet, Disp: , Rfl:    Cholecalciferol (VITAMIN D3) 50 MCG (2000 UT) capsule, Take 2,000 Units by mouth daily., Disp: , Rfl:    Coenzyme Q10 (CO Q 10) 100 MG CAPS, See admin instructions., Disp: , Rfl:    latanoprost (XALATAN) 0.005 % ophthalmic solution, Place 1 drop into the left eye daily., Disp: 2.5 mL, Rfl: 3   metoprolol tartrate (LOPRESSOR) 25 MG tablet, TAKE 1 TABLET TWICE A DAY, Disp: 180 tablet, Rfl: 3   Multiple Vitamin (MULTIVITAMIN) capsule, Take 1 capsule by mouth daily., Disp: , Rfl:    Omega-3 Fatty Acids  (FISH OIL PO), Take 1,290 mg by mouth daily., Disp: , Rfl:    pantoprazole (PROTONIX) 40 MG tablet, 1 tablet, Disp: , Rfl:    pravastatin (PRAVACHOL) 20 MG tablet, Take 1 tablet (20 mg total) by mouth daily., Disp: 90 tablet, Rfl: 2   telmisartan-hydrochlorothiazide (MICARDIS HCT) 40-12.5 MG tablet, TAKE 1 TABLET DAILY, Disp: 90 tablet, Rfl: 3   albuterol (VENTOLIN HFA) 108 (90 Base) MCG/ACT inhaler, 1 puff as needed (Patient not taking: Reported on 08/02/2021), Disp: , Rfl:  carbidopa-levodopa (SINEMET IR) 25-100 MG tablet, Take 1 tablet by mouth 4 (four) times daily., Disp: 360 tablet, Rfl: 1  PAST MEDICAL HISTORY: Past Medical History:  Diagnosis Date   Arthritis    GERD (gastroesophageal reflux disease)    Glaucoma    Hypertension    Osteoarthritis of knee    Painful urination    Peripheral vascular disease ([place])    bilateral edema     PAST SURGICAL HISTORY: Past Surgical History:  Procedure Laterality Date   CARDIAC CATHETERIZATION  04/2017   CATARACT EXTRACTION, BILATERAL Bilateral    Left: 10/30/21, right: 11/20/21.   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   KNEE ARTHROSCOPY Left 6-7 years ago   LEFT HEART CATH AND CORONARY ANGIOGRAPHY N/A 04/17/2017   Procedure: LEFT HEART CATH AND CORONARY ANGIOGRAPHY;  Surgeon: Troy Sine, MD;  Location: [facility];  Service: Cardiovascular;  Laterality: N/A;   RADIOLOGY WITH ANESTHESIA N/A 08/30/2020   Procedure: MRI WITH ANESTHESIA BRAIN WITH AND WITHOUT AND MRI CERVICAL SPINE WITH AND WITHOUT;  Surgeon: Radiologist, Medication, MD;  Location: [place];  Service: Radiology;  Laterality: N/A;   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   TONSILLECTOMY     TOTAL KNEE ARTHROPLASTY Left 10/08/2016   Procedure: LEFT TOTAL KNEE ARTHROPLASTY;  Surgeon: Gaynelle Arabian, MD;  Location: [facility];  Service: Orthopedics;  Laterality: Left;   TOTAL KNEE ARTHROPLASTY Right 06/17/2017    Procedure: RIGHT TOTAL KNEE ARTHROPLASTY;  Surgeon: Gaynelle Arabian, MD;  Location: [facility];  Service: Orthopedics;  Laterality: Right;    FAMILY HISTORY: Family History  Problem Relation Age of Onset   Breast cancer Mother 14   Prostate cancer Father    Breast cancer Sister 75   Parkinson's disease Sister    Healthy Daughter    Neuropathy Neg Hx     SOCIAL HISTORY:  Social History   Socioeconomic History   Marital status: Married    Spouse name: Not on file   Number of children: 2   Years of education: Not on file   Highest education level: Bachelor's degree (e.g., BA, AB, BS)  Occupational History   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[employer]   Smoking status: Former    Packs/day: 1.00    Years: 20.00    Total pack years: 20.00    Types: Cigarettes    Quit date: 10/1987    Years since quitting: 34.1   Smokeless tobacco: Never   Tobacco comments:    Quit 34 years ago  Vaping Use   Vaping Use: Never used  Substance and Sexual Activity   Alcohol use: Yes    Alcohol/week: 7.0 standard drinks of alcohol    Types: 7 Glasses of wine per week    Comment: daily, 2-3 glasses scotch per day   Drug use: No   Sexual activity: Yes  Other Topics Concern   Not on file  Social History Narrative   Lives at home with his wife   Left handed   Drinks 2-3 cups of caffeine daily (coffee)   Retired   Investment banker, 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  BP recheck 142/90    General: The patient is well-developed and well-nourished and in no acute  distress  HEENT:  Head is [place]/AT.  Sclera are anicteric.  Neck has good range of motion.  Skin: Extremities are without rash or  edema.  Neurologic Exam  Mental status:  The patient is alert and oriented x 3 at the time of the examination. The patient has apparent normal recent and remote memory, with an apparently normal attention span and concentration ability.   Speech is normal.  Cranial nerves: Extraocular movements are full.  Facial strength and sensation was normal.   No dysarthria is noted. No obvious hearing deficits are noted.  Motor: He does not have bradykinesia.  He has a rapid tremor in the left hand that is worse with intention but present at rest.  There is some distraction.  There is no tremor in the right arm.  There is a mild tremor in the left.  Muscle bulk is normal.   Tone is very slightly increased in the left arm.. Strength is  5 / 5 in all 4 extremities except 4+/5 EHL strength in the feet.   Sensory: Sensory testing is intact to pinprick, soft touch and vibration sensation in the arms but reduced pinprick and vibration in the foot.  Vibration sense fairy normal at ankle (10% great toe and 50% distal foot).  Coordination: Cerebellar testing reveals good finger-nose-finger and heel-to-shin bilaterally.  Gait and station: Station is normal.  Gait is normal for age.  Mild reduced tandem.  He has no retropulsion Romberg is negative.   Reflexes: Deep tendon reflexes are symmetric and normal bilaterally.     DIAGNOSTIC DATA (LABS, IMAGING, TESTING) - I reviewed patient records, labs, notes, testing and imaging myself where available.  Lab Results  Component Value Date   WBC 8.4 05/15/2021   HGB 15.2 05/15/2021   HCT 43.4 05/15/2021   MCV 98.0 05/15/2021   PLT 179 05/15/2021      Component Value Date/Time   NA 134 (L) 05/15/2021 1625   NA 140 07/11/2020 0929   K 3.8 05/15/2021 1625   CL 96 (L) 05/15/2021 1625   CO2 28 05/15/2021 1625   GLUCOSE 113 (H)  05/15/2021 1625   BUN 13 05/15/2021 1625   BUN 15 07/11/2020 0929   CREATININE 0.88 05/15/2021 1625   CALCIUM 9.2 05/15/2021 1625   PROT 7.4 05/15/2021 1625   PROT 7.1 07/11/2020 0929   ALBUMIN 4.2 05/15/2021 1625   ALBUMIN 4.5 07/11/2020 0929   AST 26 05/15/2021 1625   ALT 22 05/15/2021 1625   ALKPHOS 29 (L) 05/15/2021 1625   BILITOT 1.9 (H) 05/15/2021 1625   BILITOT 1.2 07/11/2020 0929   GFRNONAA >60 05/15/2021 1625   GFRAA 92 05/28/2019 1113   Lab Results  Component Value Date   CHOL 143 07/11/2020   HDL 51 07/11/2020   LDLCALC 78 07/11/2020   TRIG 73 07/11/2020   CHOLHDL 2.8 07/11/2020        ASSESSMENT AND PLAN  Parkinson's disease (HCC)  Episodic lightheadedness  Neck pain  Polyneuropathy  1.  Try compression stockings for the episodes of lightheadedness.  If lightheadedness spells worsen consider fludrocortisone. 2.   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   I  recommend we obtain the DAT scan but he is claustophobic.  Marland Kitchen     He will continue 4 Sinemet pills a day.      Add amantadine 3.    Neuropathy has numbness but no pain.  Evaluation for B12 and MGUS was negative.   4.   He will retake BP at home and contact PCP if remains elevated  He will return in 6 months but call sooner if he has new or worsening symptoms.   Isabellah Sobocinski A. Felecia Shelling, MD, [facility] [id number], [id number] PM Certified in Neurology, Clinical Neurophysiology, Sleep Medicine and Neuroimaging  [facility] [address] [place], [place] [address] [phone]

## 2022-06-12 DIAGNOSIS — H34812 Central retinal vein occlusion, left eye, with macular edema: Secondary | ICD-10-CM | POA: Diagnosis not present

## 2022-06-12 DIAGNOSIS — H401111 Primary open-angle glaucoma, right eye, mild stage: Secondary | ICD-10-CM | POA: Diagnosis not present

## 2022-06-19 ENCOUNTER — Encounter: Payer: Self-pay | Admitting: Physical Therapy

## 2022-06-19 ENCOUNTER — Other Ambulatory Visit: Payer: Self-pay

## 2022-06-19 ENCOUNTER — Ambulatory Visit: Payer: Medicare Other | Attending: Neurology | Admitting: Physical Therapy

## 2022-06-19 DIAGNOSIS — R293 Abnormal posture: Secondary | ICD-10-CM | POA: Diagnosis not present

## 2022-06-19 DIAGNOSIS — M542 Cervicalgia: Secondary | ICD-10-CM | POA: Diagnosis not present

## 2022-06-19 DIAGNOSIS — R2681 Unsteadiness on feet: Secondary | ICD-10-CM | POA: Insufficient documentation

## 2022-06-19 DIAGNOSIS — R29818 Other symptoms and signs involving the nervous system: Secondary | ICD-10-CM | POA: Diagnosis not present

## 2022-06-19 DIAGNOSIS — R2689 Other abnormalities of gait and mobility: Secondary | ICD-10-CM | POA: Diagnosis not present

## 2022-06-19 DIAGNOSIS — G20A1 Parkinson's disease without dyskinesia, without mention of fluctuations: Secondary | ICD-10-CM | POA: Diagnosis not present

## 2022-06-19 NOTE — Therapy (Signed)
OUTPATIENT PHYSICAL THERAPY NEURO EVALUATION   Patient Name: Todd Chan MRN: [id number] DOB:[birth year], 77 y.o., male Today's Date: 06/19/2022   PCP: Todd Bishop, DO REFERRING PROVIDER:   Britt Bottom, MD    END OF SESSION:  PT End of Session - 06/19/22 0753     Visit Number 1    Number of Visits 10    Date for PT Re-Evaluation 07/20/22    Authorization Type Medicare    PT Start Time [id number]    PT Stop Time 0843    PT Time Calculation (min) 44 min    Activity Tolerance Patient tolerated treatment well    Behavior During Therapy [facility] for tasks assessed/performed             Past Medical History:  Diagnosis Date   Arthritis    GERD (gastroesophageal reflux disease)    Glaucoma    Hypertension    Osteoarthritis of knee    Painful urination    Peripheral vascular disease ([place])    bilateral edema    Past Surgical History:  Procedure Laterality Date   CARDIAC CATHETERIZATION  04/2017   CATARACT EXTRACTION, BILATERAL Bilateral    Left: 10/30/21, right: 11/20/21.   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   KNEE ARTHROSCOPY Left 6-7 years ago   LEFT HEART CATH AND CORONARY ANGIOGRAPHY N/A 04/17/2017   Procedure: LEFT HEART CATH AND CORONARY ANGIOGRAPHY;  Surgeon: Todd Sine, MD;  Location: [facility];  Service: Cardiovascular;  Laterality: N/A;   RADIOLOGY WITH ANESTHESIA N/A 08/30/2020   Procedure: MRI WITH ANESTHESIA BRAIN WITH AND WITHOUT AND MRI CERVICAL SPINE WITH AND WITHOUT;  Surgeon: Todd Chan, Medication, MD;  Location: [facility];  Service: Radiology;  Laterality: N/A;   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   TONSILLECTOMY     TOTAL KNEE ARTHROPLASTY Left 10/08/2016   Procedure: LEFT TOTAL KNEE ARTHROPLASTY;  Surgeon: Todd Arabian, MD;  Location: [facility];  Service: Orthopedics;  Laterality: Left;   TOTAL KNEE ARTHROPLASTY Right 06/17/2017   Procedure: RIGHT TOTAL KNEE  ARTHROPLASTY;  Surgeon: Todd Arabian, MD;  [facility];  Service: Orthopedics;  Laterality: Right;   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   Essential tremor 07/13/2021   Gallstones 07/13/2021   Gastro-esophageal reflux disease with esophagitis, without bleeding 07/13/2021   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/13/2021   Personal history of colonic polyps 07/13/2021   Seasonal allergic rhinitis 07/13/2021   Primary open-angle glaucoma, left eye, mild stage 05/09/2021   Disequilibrium 09/27/2020   Cataract, nuclear sclerotic, both eyes 07/06/2020   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/2020   Tremor 01/21/2020   Polyneuropathy 01/21/2020   Stuffy ears, bilateral 01/21/2020   Otalgia of both ears 01/21/2020   Other headache syndrome 01/21/2020   Carotid stenosis, asymptomatic, bilateral 01/21/2020   Episodic lightheadedness 05/28/2019   Ascending aortic aneurysm (HCC) 4.2 cm on CT from January 2020 05/28/2019   Snoring 05/28/2019   Pleurisy 04/10/2018   Abnormal nuclear stress test    Exertional dyspnea    Abnormal myocardial perfusion study 04/11/2017   Chest pain in adult 03/18/2017   Edema 03/18/2017   Mild CAD 03/18/2017   Peripheral vascular disease ([place])  Painful urination    Osteoarthritis of knee    Hypertension    Hyperlipidemia    GERD (gastroesophageal reflux disease)    Fibromyalgia    Arthritis    OA (osteoarthritis) of knee 10/08/2016    ONSET DATE: 06/07/2022 (MD referral); tremor started 2016  REFERRING DIAG:  G20.A1 (ICD-10-CM) - Parkinson's disease without dyskinesia or fluctuating manifestations  M54.2 (ICD-10-CM) - Neck pain    THERAPY DIAG:  Other abnormalities of gait and  mobility  Unsteadiness on feet  Abnormal posture  Other symptoms and signs involving the nervous system  Rationale for Evaluation and Treatment: Rehabilitation  SUBJECTIVE:                                                                                                                                                                                             SUBJECTIVE STATEMENT: With my upper back, my neck seems tight.  Tremor is present, overall think that PT would help.  Wife reports there is shuffling and weakness.   Pt accompanied by: significant other-wife, Todd Chan  PERTINENT HISTORY: HTN, lightheadedness, neck pain, arthritis, GERD, glaucoma, OA knee s/p L TKR 2018 and R TKR 2019, PVD, polyneuropahty  PAIN:  Are you having pain? No  PRECAUTIONS: Fall  WEIGHT BEARING RESTRICTIONS: No  FALLS: Has patient fallen in last 6 months? No  LIVING ENVIRONMENT: Lives with: lives with their spouse Lives in: House/apartment Stairs: Yes: Internal: 12 steps; on right going up Bedroom on first floor Has following equipment at home: Single point cane and Walker - 2 wheeled  PLOF: Independent and Leisure: Enjoys yardwork  Has exercise bike at home.  Has used 30 min/day.  Has aerobic step  PATIENT GOALS: To work on getting legs stronger.  To loosen up with neck.  OBJECTIVE:   DIAGNOSTIC FINDINGS: CT head was normal for age.   CT cervical showed some DJD with moderate foraminal narrowing at C3C4 but no severe changes.     MRI of the brain 5/312023 was normal for age - mild atrophy and small vessel changes.  Nothing acute.     MRI of the cervical spine 08/30/2021 showed a normal spinal cord.  At [facility] he has mild spinal stenosis due to disc bulging and spondylosis.  Also foraminal narrowing.  A few small bulges elsewhere without sinal stenosis.    COGNITION: Overall cognitive status: Within functional limits for tasks assessed   SENSATION: Light touch: WFL  EDEMA:  Noted LLE >RLE;  worse in dependent positions  MUSCLE TONE: WFL BLEs slight increased initially LLE  POSTURE: rounded shoulders and forward head  LOWER EXTREMITY ROM:  tightness noted in sitting R hamstrings, not fully extension  Active  Right Eval Left Eval  Hip flexion    Hip extension    Hip abduction    Hip adduction    Hip internal rotation    Hip external rotation    Knee flexion    Knee extension    Ankle dorsiflexion 20 11  Ankle plantarflexion    Ankle inversion    Ankle eversion     (Blank rows = not tested)  Neck A/ROM: Flexion  45 degrees; reports tightness Extension  30 degrees R rotation  38 degrees  L rotation  50 degrees LOWER EXTREMITY MMT:    MMT Right Eval Left Eval  Hip flexion 5/5 5/5  Hip extension    Hip abduction    Hip adduction    Hip internal rotation    Hip external rotation    Knee flexion 5/5 5/5  Knee extension 5/5 5/5  Ankle dorsiflexion 4+/5 4/5  Ankle plantarflexion    Ankle inversion    Ankle eversion    (Blank rows = not tested)  TRANSFERS: Assistive device utilized: None  Sit to stand: Modified independence  GAIT: Gait pattern: step through pattern, decreased arm swing- Left, decreased step length- Right, and decreased step length- Left Distance walked: 50 ft Assistive device utilized: None Level of assistance: Modified independence Comments: Pt reports shuffling more in home, when more aware, he tends to not shuffle  FUNCTIONAL TESTS:  5 times sit to stand: 10.53 sec arms crossed at chest Timed up and go (TUG): 10.75 sec 10 meter walk test: 10.84 sec  (3.03 ft/sec) TUG man:10.56 sec TUG cog:  11.22 sec 321M back:  9.09 MiniBESTest:  25/28  360 to L:  7 steps, 4.25 sec; to R:  7 steps, 3.65 sec   TODAY'S TREATMENT:                                                                                                                              DATE: 06/19/2022    PATIENT EDUCATION: Education details: Eval results, POC Person  educated: Patient and Spouse Education method: Explanation Education comprehension: verbalized understanding  HOME EXERCISE PROGRAM: Not yet initiated  GOALS: Goals reviewed with patient? Yes  SHORT TERM GOALS: Target date: 07/06/2022  Pt will be independent with HEP for improved balance, gait. Baseline: Goal status: INITIAL   LONG TERM GOALS: Target date: 07/20/2022  Pt will be independent with progression of HEP for improved balance, gait. Baseline:  Goal status: INITIAL  2.  Pt will improve 321M walk backwards to <6 seconds for decreased fall risk. Baseline: 9.09 sec Goal status: INITIAL  3.  Pt will improve neck flexibility in extension and rotation by at least 5 degrees for improved neck flexibility/participation in ADLS. Baseline:  Goal status: INITIAL  4.  Pt will verbalize understanding of local Parkinson's disease community resources, including optimal fitness program options upon d/c from PT.  Baseline:  Goal status: INITIAL  ASSESSMENT:  CLINICAL IMPRESSION: Patient is a 77 y.o. male who was seen today for physical therapy evaluation and treatment for Parkinson's disease, neck pain.  Pt/wife present at eval, and report stiffness in neck and shuffling steps with gait as major obstacles in functional mobility.  He enjoys yardwork, and he doesn't currently have HEP or use exercise bike at home.  He presents with BUE tremors (L>R), abnormal posture, decreased SLS for balance, decreased flexibility in neck and trunk.  He will benefit from skilled PT to address optimal fitness program for improved functional mobility and continued decreased fall risk.   OBJECTIVE IMPAIRMENTS: Abnormal gait, decreased balance, decreased mobility, decreased ROM, decreased strength, impaired flexibility, and postural dysfunction.   ACTIVITY LIMITATIONS: carrying, standing, and locomotion level  PARTICIPATION LIMITATIONS: community activity and yard work  PERSONAL FACTORS: 3+ comorbidities:  see PMH above  are also affecting patient's functional outcome.   REHAB POTENTIAL: Good  CLINICAL DECISION MAKING: Evolving/moderate complexity  EVALUATION COMPLEXITY: Moderate  PLAN:  PT FREQUENCY: 2x/week  PT DURATION: other: 5 weeks, including eval week  PLANNED INTERVENTIONS: Therapeutic exercises, Therapeutic activity, Neuromuscular re-education, Balance training, Gait training, Patient/Family education, Self Care, and Manual therapy  PLAN FOR NEXT SESSION: Initiate PWR! Moves in sitting, standing, gait training for arm swing, stride length; optimal fitness program -flexibility, strength, gait, balance, PWR! Moves, use of bike for aerobic activity at home; walking program   Frazier Butt., PT 06/19/2022, 8:46 AM  [facility] at [facility] [place], [place], [place] [address] Phone # [phone] Fax # [phone]

## 2022-06-21 ENCOUNTER — Ambulatory Visit: Payer: Medicare Other | Admitting: Physical Therapy

## 2022-06-21 ENCOUNTER — Encounter: Payer: Self-pay | Admitting: Physical Therapy

## 2022-06-21 DIAGNOSIS — R2689 Other abnormalities of gait and mobility: Secondary | ICD-10-CM

## 2022-06-21 DIAGNOSIS — R293 Abnormal posture: Secondary | ICD-10-CM

## 2022-06-21 DIAGNOSIS — R29818 Other symptoms and signs involving the nervous system: Secondary | ICD-10-CM | POA: Diagnosis not present

## 2022-06-21 DIAGNOSIS — R2681 Unsteadiness on feet: Secondary | ICD-10-CM | POA: Diagnosis not present

## 2022-06-21 DIAGNOSIS — M542 Cervicalgia: Secondary | ICD-10-CM | POA: Diagnosis not present

## 2022-06-21 DIAGNOSIS — G20A1 Parkinson's disease without dyskinesia, without mention of fluctuations: Secondary | ICD-10-CM | POA: Diagnosis not present

## 2022-06-21 NOTE — Therapy (Signed)
OUTPATIENT PHYSICAL THERAPY NEURO TREATMENT NOTE   Patient Name: Todd Chan MRN: [id number] DOB:[birth year], 77 y.o., male Today's Date: 06/22/2022   PCP: Lyman Bishop, DO REFERRING PROVIDER:   Britt Bottom, MD    END OF SESSION:  PT End of Session - 06/21/22 1152     Visit Number 2    Number of Visits 10    Date for PT Re-Evaluation 07/20/22    Authorization Type Medicare    PT Start Time 1152    PT Stop Time 1226    PT Time Calculation (min) 34 min    Activity Tolerance Patient tolerated treatment well    Behavior During Therapy WFL for tasks assessed/performed              Past Medical History:  Diagnosis Date   Arthritis    GERD (gastroesophageal reflux disease)    Glaucoma    Hypertension    Osteoarthritis of knee    Painful urination    Peripheral vascular disease ([place])    bilateral edema    Past Surgical History:  Procedure Laterality Date   CARDIAC CATHETERIZATION  04/2017   CATARACT EXTRACTION, BILATERAL Bilateral    Left: 10/30/21, right: 11/20/21.   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   KNEE ARTHROSCOPY Left 6-7 years ago   LEFT HEART CATH AND CORONARY ANGIOGRAPHY N/A 04/17/2017   Procedure: LEFT HEART CATH AND CORONARY ANGIOGRAPHY;  Surgeon: Troy Sine, MD;  Location: [facility];  Service: Cardiovascular;  Laterality: N/A;   RADIOLOGY WITH ANESTHESIA N/A 08/30/2020   Procedure: MRI WITH ANESTHESIA BRAIN WITH AND WITHOUT AND MRI CERVICAL SPINE WITH AND WITHOUT;  Surgeon: Radiologist, Medication, MD;  Location: [facility];  Service: Radiology;  Laterality: N/A;   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   TONSILLECTOMY     TOTAL KNEE ARTHROPLASTY Left 10/08/2016   Procedure: LEFT TOTAL KNEE ARTHROPLASTY;  Surgeon: Gaynelle Arabian, MD;  Location: [facility];  Service: Orthopedics;  Laterality: Left;   TOTAL KNEE ARTHROPLASTY Right 06/17/2017   Procedure: RIGHT TOTAL KNEE  ARTHROPLASTY;  Surgeon: Gaynelle Arabian, MD;  Location: [facility];  Service: Orthopedics;  Laterality: Right;   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   Essential tremor 07/13/2021   Gallstones 07/13/2021   Gastro-esophageal reflux disease with esophagitis, without bleeding 07/13/2021   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   Personal history of colonic polyps 07/13/2021   Seasonal allergic rhinitis 07/13/2021   Primary open-angle glaucoma, left eye, mild stage 05/09/2021   Disequilibrium 09/27/2020   Cataract, nuclear sclerotic, both eyes 07/06/2020   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/2020   Tremor 01/21/2020   Polyneuropathy 01/21/2020   Stuffy ears, bilateral 01/21/2020   Otalgia of both ears 01/21/2020   Other headache syndrome 01/21/2020   Carotid stenosis, asymptomatic, bilateral 01/21/2020   Episodic lightheadedness 05/28/2019   Ascending aortic aneurysm (HCC) 4.2 cm on CT from January 2020 05/28/2019   Snoring 05/28/2019   Pleurisy 04/10/2018   Abnormal nuclear stress test    Exertional dyspnea    Abnormal myocardial perfusion study 04/11/2017   Chest pain in adult 03/18/2017   Edema 03/18/2017   Mild CAD 03/18/2017   Peripheral vascular  disease ([place])    Painful urination    Osteoarthritis of knee    Hypertension    Hyperlipidemia    GERD (gastroesophageal reflux disease)    Fibromyalgia    Arthritis    OA (osteoarthritis) of knee 10/08/2016    ONSET DATE: 06/07/2022 (MD referral); tremor started 2016  REFERRING DIAG:  G20.A1 (ICD-10-CM) - Parkinson's disease without dyskinesia or fluctuating manifestations  M54.2 (ICD-10-CM) - Neck pain    THERAPY DIAG:  Other abnormalities of gait and  mobility  Unsteadiness on feet  Other symptoms and signs involving the nervous system  Abnormal posture  Rationale for Evaluation and Treatment: Rehabilitation  SUBJECTIVE:                                                                                                                                                                                             SUBJECTIVE STATEMENT: Just some typical stiffness, but nothing new. Pt accompanied by: significant other-wife, [name]  PERTINENT HISTORY: HTN, lightheadedness, neck pain, arthritis, GERD, glaucoma, OA knee s/p L TKR 2018 and R TKR 2019, PVD, polyneuropahty  PAIN:  Are you having pain? No  PRECAUTIONS: Fall  WEIGHT BEARING RESTRICTIONS: No  FALLS: Has patient fallen in last 6 months? No  LIVING ENVIRONMENT: Lives with: lives with their spouse Lives in: House/apartment Stairs: Yes: Internal: 12 steps; on right going up Bedroom on first floor Has following equipment at home: Single point cane and Walker - 2 wheeled  PLOF: Independent and Leisure: Enjoys yardwork  Has exercise bike at home.  Has used 30 min/day.  Has aerobic step  PATIENT GOALS: To work on getting legs stronger.  To loosen up with neck.  OBJECTIVE:    TODAY'S TREATMENT: 06/21/2022  Pt performs PWR! Moves in seated position x 10 reps, with initial 2-3 reps for position/warm up   PWR! Up for improved posture  PWR! Rock for improved weighshifting  PWR! Twist for improved trunk rotation   PWR! Step for improved step initiation   Cues provided for technique, intensity of movement.  Added to HEP and used handouts for additional reps.  Sit<>stand x 8 reps from mat, no UE support, cues for upright posture upon standing.  Gait training with emphasis on increased step length, arm swing, foot clearance  NuStep, Level 3, 4 extremities x  6 minutes, keeps SPM >90-100.  Rates effort level as 2-3/10   PATIENT EDUCATION: Education details: HEP  initiated-PWR! Moves sitting Person educated: Patient Education method: Explanation, Demonstration, and Handouts Education comprehension: verbalized understanding, returned demonstration, and needs further education  ---------------------------------------------------------------------------- Objective measures below taken at initial evaluation:  DIAGNOSTIC FINDINGS: CT head was normal for age.   CT cervical showed some DJD with moderate foraminal narrowing at C3C4 but no severe changes.     MRI of the brain 5/312023 was normal for age - mild atrophy and small vessel changes.  Nothing acute.     MRI of the cervical spine 08/30/2021 showed a normal spinal cord.  At [facility] he has mild spinal stenosis due to disc bulging and spondylosis.  Also foraminal narrowing.  A few small bulges elsewhere without sinal stenosis.    COGNITION: Overall cognitive status: Within functional limits for tasks assessed   SENSATION: Light touch: WFL  EDEMA:  Noted LLE >RLE; worse in dependent positions  MUSCLE TONE: WFL BLEs slight increased initially LLE  POSTURE: rounded shoulders and forward head  LOWER EXTREMITY ROM:   tightness noted in sitting R hamstrings, not fully extension  Active  Right Eval Left Eval  Hip flexion    Hip extension    Hip abduction    Hip adduction    Hip internal rotation    Hip external rotation    Knee flexion    Knee extension    Ankle dorsiflexion 20 11  Ankle plantarflexion    Ankle inversion    Ankle eversion     (Blank rows = not tested)  Neck A/ROM: Flexion  45 degrees; reports tightness Extension  30 degrees R rotation  38 degrees  L rotation  50 degrees LOWER EXTREMITY MMT:    MMT Right Eval Left Eval  Hip flexion 5/5 5/5  Hip extension    Hip abduction    Hip adduction    Hip internal rotation    Hip external rotation    Knee flexion 5/5 5/5  Knee extension 5/5 5/5  Ankle dorsiflexion 4+/5 4/5  Ankle plantarflexion    Ankle inversion     Ankle eversion    (Blank rows = not tested)  TRANSFERS: Assistive device utilized: None  Sit to stand: Modified independence  GAIT: Gait pattern: step through pattern, decreased arm swing- Left, decreased step length- Right, and decreased step length- Left Distance walked: 50 ft Assistive device utilized: None Level of assistance: Modified independence Comments: Pt reports shuffling more in home, when more aware, he tends to not shuffle  FUNCTIONAL TESTS:  5 times sit to stand: 10.53 sec arms crossed at chest Timed up and go (TUG): 10.75 sec 10 meter walk test: 10.84 sec  (3.03 ft/sec) TUG man:10.56 sec TUG cog:  11.22 sec 39M back:  9.09 MiniBESTest:  25/28  360 to L:  7 steps, 4.25 sec; to R:  7 steps, 3.65 sec   TODAY'S TREATMENT:                                                                                                                              DATE: 06/19/2022    PATIENT EDUCATION: Education details: Eval results, POC Person educated: Patient and Spouse Education method:  Explanation Education comprehension: verbalized understanding  HOME EXERCISE PROGRAM: Not yet initiated  GOALS: Goals reviewed with patient? Yes  SHORT TERM GOALS: Target date: 07/06/2022  Pt will be independent with HEP for improved balance, gait. Baseline: Goal status: INITIAL   LONG TERM GOALS: Target date: 07/20/2022  Pt will be independent with progression of HEP for improved balance, gait. Baseline:  Goal status: INITIAL  2.  Pt will improve 57M walk backwards to <6 seconds for decreased fall risk. Baseline: 9.09 sec Goal status: INITIAL  3.  Pt will improve neck flexibility in extension and rotation by at least 5 degrees for improved neck flexibility/participation in ADLS. Baseline:  Goal status: INITIAL  4.  Pt will verbalize understanding of local Parkinson's disease community resources, including optimal fitness program options upon d/c from PT.   Baseline:  Goal  status: INITIAL  ASSESSMENT:  CLINICAL IMPRESSION: Initiated HEP with seated PWR! Moves to emphasize large amplitude movement patterns for posture, weightshifting, trunk rotation, and stepping.  Also worked to emphasize increased intensity of movement patterns with gait and with aerobic activity at end of session.  He will need additional reinforcement and work on this.    OBJECTIVE IMPAIRMENTS: Abnormal gait, decreased balance, decreased mobility, decreased ROM, decreased strength, impaired flexibility, and postural dysfunction.   ACTIVITY LIMITATIONS: carrying, standing, and locomotion level  PARTICIPATION LIMITATIONS: community activity and yard work  PERSONAL FACTORS: 3+ comorbidities: see PMH above  are also affecting patient's functional outcome.   REHAB POTENTIAL: Good  CLINICAL DECISION MAKING: Evolving/moderate complexity  EVALUATION COMPLEXITY: Moderate  PLAN:  PT FREQUENCY: 2x/week  PT DURATION: other: 5 weeks, including eval week  PLANNED INTERVENTIONS: Therapeutic exercises, Therapeutic activity, Neuromuscular re-education, Balance training, Gait training, Patient/Family education, Self Care, and Manual therapy  PLAN FOR NEXT SESSION: Review PWR! Moves in sitting, work on [employer]! Moves in standing; gait training for arm swing, stride length; optimal fitness program -flexibility, strength, gait, balance, PWR! Moves, use of bike for aerobic activity at home; walking program   Frazier Butt., PT 06/22/2022, 1:51 PM  [facility] at [facility] [place], [place], [place] [address] Phone # [phone] Fax # [phone]

## 2022-06-25 ENCOUNTER — Encounter: Payer: Self-pay | Admitting: Physical Therapy

## 2022-06-25 ENCOUNTER — Ambulatory Visit: Payer: Medicare Other | Admitting: Physical Therapy

## 2022-06-25 DIAGNOSIS — R293 Abnormal posture: Secondary | ICD-10-CM

## 2022-06-25 DIAGNOSIS — R2689 Other abnormalities of gait and mobility: Secondary | ICD-10-CM | POA: Diagnosis not present

## 2022-06-25 DIAGNOSIS — R29818 Other symptoms and signs involving the nervous system: Secondary | ICD-10-CM | POA: Diagnosis not present

## 2022-06-25 DIAGNOSIS — M542 Cervicalgia: Secondary | ICD-10-CM | POA: Diagnosis not present

## 2022-06-25 DIAGNOSIS — R2681 Unsteadiness on feet: Secondary | ICD-10-CM

## 2022-06-25 DIAGNOSIS — G20A1 Parkinson's disease without dyskinesia, without mention of fluctuations: Secondary | ICD-10-CM | POA: Diagnosis not present

## 2022-06-25 NOTE — Therapy (Signed)
OUTPATIENT PHYSICAL THERAPY NEURO TREATMENT NOTE   Patient Name: Todd Chan MRN: [id number] DOB:[birth year], 77 y.o., male Today's Date: 06/25/2022   PCP: Lyman Bishop, DO REFERRING PROVIDER:   Britt Bottom, MD    END OF SESSION:  PT End of Session - 06/25/22 1025     Visit Number 3    Number of Visits 10    Date for PT Re-Evaluation 07/20/22    Authorization Type Medicare    PT Start Time 1022    PT Stop Time 1100    PT Time Calculation (min) 38 min    Activity Tolerance Patient tolerated treatment well    Behavior During Therapy WFL for tasks assessed/performed              Past Medical History:  Diagnosis Date   Arthritis    GERD (gastroesophageal reflux disease)    Glaucoma    Hypertension    Osteoarthritis of knee    Painful urination    Peripheral vascular disease ([place])    bilateral edema    Past Surgical History:  Procedure Laterality Date   CARDIAC CATHETERIZATION  04/2017   CATARACT EXTRACTION, BILATERAL Bilateral    Left: 10/30/21, right: 11/20/21.   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   KNEE ARTHROSCOPY Left 6-7 years ago   LEFT HEART CATH AND CORONARY ANGIOGRAPHY N/A 04/17/2017   Procedure: LEFT HEART CATH AND CORONARY ANGIOGRAPHY;  Surgeon: Troy Sine, MD;  Location: [facility];  Service: Cardiovascular;  Laterality: N/A;   RADIOLOGY WITH ANESTHESIA N/A 08/30/2020   Procedure: MRI WITH ANESTHESIA BRAIN WITH AND WITHOUT AND MRI CERVICAL SPINE WITH AND WITHOUT;  Surgeon: Radiologist, Medication, MD;  Location: [facility];  Service: Radiology;  Laterality: N/A;   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   TONSILLECTOMY     TOTAL KNEE ARTHROPLASTY Left 10/08/2016   Procedure: LEFT TOTAL KNEE ARTHROPLASTY;  Surgeon: Gaynelle Arabian, MD;  Location: [facility];  Service: Orthopedics;  Laterality: Left;   TOTAL KNEE ARTHROPLASTY Right 06/17/2017   Procedure: RIGHT TOTAL KNEE  ARTHROPLASTY;  Surgeon: Gaynelle Arabian, MD;  Location: [facility];  Service: Orthopedics;  Laterality: Right;   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   Essential tremor 07/13/2021   Gallstones 07/13/2021   Gastro-esophageal reflux disease with esophagitis, without bleeding 07/13/2021   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   Personal history of colonic polyps 07/13/2021   Seasonal allergic rhinitis 07/13/2021   Primary open-angle glaucoma, left eye, mild stage 05/09/2021   Disequilibrium 09/27/2020   Cataract, nuclear sclerotic, both eyes 07/06/2020   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/2020   Tremor 01/21/2020   Polyneuropathy 01/21/2020   Stuffy ears, bilateral 01/21/2020   Otalgia of both ears 01/21/2020   Other headache syndrome 01/21/2020   Carotid stenosis, asymptomatic, bilateral 01/21/2020   Episodic lightheadedness 05/28/2019   Ascending aortic aneurysm ([facility]CC) 4.2 cm on CT from January 2020 05/28/2019   Snoring 05/28/2019   Pleurisy 04/10/2018   Abnormal nuclear stress test    Exertional dyspnea    Abnormal myocardial perfusion study 04/11/2017   Chest pain in adult 03/18/2017   Edema 03/18/2017   Mild CAD 03/18/2017   Peripheral vascular  disease ([place])    Painful urination    Osteoarthritis of knee    Hypertension    Hyperlipidemia    GERD (gastroesophageal reflux disease)    Fibromyalgia    Arthritis    OA (osteoarthritis) of knee 10/08/2016    ONSET DATE: 06/07/2022 (MD referral); tremor started 2016  REFERRING DIAG:  G20.A1 (ICD-10-CM) - Parkinson's disease without dyskinesia or fluctuating manifestations  M54.2 (ICD-10-CM) - Neck pain    THERAPY DIAG:  Other abnormalities of gait and  mobility  Unsteadiness on feet  Other symptoms and signs involving the nervous system  Abnormal posture  Rationale for Evaluation and Treatment: Rehabilitation  SUBJECTIVE:                                                                                                                                                                                             SUBJECTIVE STATEMENT: Did the exercises  Think I have them right.   Pt accompanied by: self  PERTINENT HISTORY: HTN, lightheadedness, neck pain, arthritis, GERD, glaucoma, OA knee s/p L TKR 2018 and R TKR 2019, PVD, polyneuropahty  PAIN:  Are you having pain? No  PRECAUTIONS: Fall  WEIGHT BEARING RESTRICTIONS: No  FALLS: Has patient fallen in last 6 months? No  LIVING ENVIRONMENT: Lives with: lives with their spouse Lives in: House/apartment Stairs: Yes: Internal: 12 steps; on right going up Bedroom on first floor Has following equipment at home: Single point cane and Walker - 2 wheeled  PLOF: Independent and Leisure: Enjoys yardwork  Has exercise bike at home.  Has used 30 min/day.  Has aerobic step  PATIENT GOALS: To work on getting legs stronger.  To loosen up with neck.  OBJECTIVE:    TODAY'S TREATMENT: 06/25/2022 Reviewed PWR! Moves in seated position x 10 reps, with initial 2-3 reps for position/warm up   PWR! Up for improved posture  PWR! Rock for improved weighshifting  PWR! Twist for improved trunk rotation   PWR! Step for improved step initiation   Cues provided for technique, intensity of movement.  At end of session, reviewed x 5 additional reps with handout and with wife observing.   Pt performs PWR! Moves in standing position x 10 reps with several initial reps for warm-up each position   PWR! Up for improved posture  PWR! Rock for improved weighshifting  PWR! Twist for improved trunk rotation   PWR! Step for improved step initiation   Cues provided for technique, intensity  At  counter:   Forward step over obstacle 2 x 10 Side step over obstacle 2 x 10 Back step and weigthshift 2 x 10-cues  for step and rock  PWR! Moves for hands x 5-10 reps; cues for slowed pace, larger amplitude  Gait training with emphasis on increased step length, arm swing, foot clearance, 50 ft x 8 reps.      PATIENT EDUCATION: Education details: HEP review-PWR! Moves sitting Person educated: Patient; spouse at end of session Education method: Explanation, Demonstration, and Handouts Education comprehension: verbalized understanding, returned demonstration, and needs further education  ---------------------------------------------------------------------------- Objective measures below taken at initial evaluation:  DIAGNOSTIC FINDINGS: CT head was normal for age.   CT cervical showed some DJD with moderate foraminal narrowing at C3C4 but no severe changes.     MRI of the brain 5/312023 was normal for age - mild atrophy and small vessel changes.  Nothing acute.     MRI of the cervical spine 08/30/2021 showed a normal spinal cord.  At [facility] he has mild spinal stenosis due to disc bulging and spondylosis.  Also foraminal narrowing.  A few small bulges elsewhere without sinal stenosis.    COGNITION: Overall cognitive status: Within functional limits for tasks assessed   SENSATION: Light touch: WFL  EDEMA:  Noted LLE >RLE; worse in dependent positions  MUSCLE TONE: WFL BLEs slight increased initially LLE  POSTURE: rounded shoulders and forward head  LOWER EXTREMITY ROM:   tightness noted in sitting R hamstrings, not fully extension  Active  Right Eval Left Eval  Hip flexion    Hip extension    Hip abduction    Hip adduction    Hip internal rotation    Hip external rotation    Knee flexion    Knee extension    Ankle dorsiflexion 20 11  Ankle plantarflexion    Ankle inversion    Ankle eversion     (Blank rows = not tested)  Neck A/ROM: Flexion  45 degrees; reports  tightness Extension  30 degrees R rotation  38 degrees  L rotation  50 degrees LOWER EXTREMITY MMT:    MMT Right Eval Left Eval  Hip flexion 5/5 5/5  Hip extension    Hip abduction    Hip adduction    Hip internal rotation    Hip external rotation    Knee flexion 5/5 5/5  Knee extension 5/5 5/5  Ankle dorsiflexion 4+/5 4/5  Ankle plantarflexion    Ankle inversion    Ankle eversion    (Blank rows = not tested)  TRANSFERS: Assistive device utilized: None  Sit to stand: Modified independence  GAIT: Gait pattern: step through pattern, decreased arm swing- Left, decreased step length- Right, and decreased step length- Left Distance walked: 50 ft Assistive device utilized: None Level of assistance: Modified independence Comments: Pt reports shuffling more in home, when more aware, he tends to not shuffle  FUNCTIONAL TESTS:  5 times sit to stand: 10.53 sec arms crossed at chest Timed up and go (TUG): 10.75 sec 10 meter walk test: 10.84 sec  (3.03 ft/sec) TUG man:10.56 sec TUG cog:  11.22 sec 76M back:  9.09 MiniBESTest:  25/28  360 to L:  7 steps, 4.25 sec; to R:  7 steps, 3.65 sec   TODAY'S TREATMENT:  DATE: 06/19/2022    PATIENT EDUCATION: Education details: Eval results, POC Person educated: Patient and Spouse Education method: Explanation Education comprehension: verbalized understanding  HOME EXERCISE PROGRAM: Not yet initiated  GOALS: Goals reviewed with patient? Yes  SHORT TERM GOALS: Target date: 07/06/2022  Pt will be independent with HEP for improved balance, gait. Baseline: Goal status: IN PROGRESS   LONG TERM GOALS: Target date: 07/20/2022  Pt will be independent with progression of HEP for improved balance, gait. Baseline:  Goal status: IN PROGRESS  2.  Pt will improve 73M walk backwards to <6 seconds for decreased fall  risk. Baseline: 9.09 sec Goal status: IN PROGRESS  3.  Pt will improve neck flexibility in extension and rotation by at least 5 degrees for improved neck flexibility/participation in ADLS. Baseline:  Goal status: IN PROGRESS  4.  Pt will verbalize understanding of local Parkinson's disease community resources, including optimal fitness program options upon d/c from PT.   Baseline:  Goal status: IN PROGRESS  ASSESSMENT:  CLINICAL IMPRESSION: Reviewed HEP and continued with large amplitude movement patterns, including PWR! Moves in standing and PWR! Hands.  Pt has increased tremor today during balance challenges, responding in the moment to PWR! Moves with hand for activation of extension and overall motion.  With gait activities at end of session, he demo improved step length and foot clearance overall. He will continue to benefit from skilled PT towards goals for improved overall functional mobility and decreased fall risk.  OBJECTIVE IMPAIRMENTS: Abnormal gait, decreased balance, decreased mobility, decreased ROM, decreased strength, impaired flexibility, and postural dysfunction.   ACTIVITY LIMITATIONS: carrying, standing, and locomotion level  PARTICIPATION LIMITATIONS: community activity and yard work  PERSONAL FACTORS: 3+ comorbidities: see PMH above  are also affecting patient's functional outcome.   REHAB POTENTIAL: Good  CLINICAL DECISION MAKING: Evolving/moderate complexity  EVALUATION COMPLEXITY: Moderate  PLAN:  PT FREQUENCY: 2x/week  PT DURATION: other: 5 weeks, including eval week  PLANNED INTERVENTIONS: Therapeutic exercises, Therapeutic activity, Neuromuscular re-education, Balance training, Gait training, Patient/Family education, Self Care, and Manual therapy  PLAN FOR NEXT SESSION: Review PWR! Moves in sitting as needed and work on [employer]! Moves in standing to addt o HEP; gait training for arm swing, stride length; optimal fitness program -flexibility, strength,  gait, balance, PWR! Moves, use of bike for aerobic activity at home; walking program   Frazier Butt., PT 06/25/2022, 11:01 AM  [facility] at [facility] [place], [place], [place] [address] Phone # [phone] Fax # [phone]

## 2022-06-28 ENCOUNTER — Ambulatory Visit: Payer: Medicare Other | Admitting: Physical Therapy

## 2022-06-28 ENCOUNTER — Encounter: Payer: Self-pay | Admitting: Physical Therapy

## 2022-06-28 DIAGNOSIS — R293 Abnormal posture: Secondary | ICD-10-CM

## 2022-06-28 DIAGNOSIS — R2689 Other abnormalities of gait and mobility: Secondary | ICD-10-CM | POA: Diagnosis not present

## 2022-06-28 DIAGNOSIS — M542 Cervicalgia: Secondary | ICD-10-CM | POA: Diagnosis not present

## 2022-06-28 DIAGNOSIS — R2681 Unsteadiness on feet: Secondary | ICD-10-CM

## 2022-06-28 DIAGNOSIS — R29818 Other symptoms and signs involving the nervous system: Secondary | ICD-10-CM | POA: Diagnosis not present

## 2022-06-28 DIAGNOSIS — G20A1 Parkinson's disease without dyskinesia, without mention of fluctuations: Secondary | ICD-10-CM | POA: Diagnosis not present

## 2022-06-28 NOTE — Therapy (Signed)
OUTPATIENT PHYSICAL THERAPY NEURO TREATMENT NOTE   Patient Name: Todd Chan MRN: [id number] DOB:January 30, 1946, 77 y.o., male Today's Date: 06/28/2022   PCP: Lyman Bishop, DO REFERRING PROVIDER:   Britt Bottom, MD    END OF SESSION:  PT End of Session - 06/28/22 0752     Visit Number 4    Number of Visits 10    Date for PT Re-Evaluation 07/20/22    Authorization Type Medicare    PT Start Time [id number]    PT Stop Time 0841    PT Time Calculation (min) 43 min    Activity Tolerance Patient tolerated treatment well    Behavior During Therapy [facility] for tasks assessed/performed               Past Medical History:  Diagnosis Date   Arthritis    GERD (gastroesophageal reflux disease)    Glaucoma    Hypertension    Osteoarthritis of knee    Painful urination    Peripheral vascular disease ([place])    bilateral edema    Past Surgical History:  Procedure Laterality Date   CARDIAC CATHETERIZATION  04/2017   CATARACT EXTRACTION, BILATERAL Bilateral    Left: 10/30/21, right: 11/20/21.   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   KNEE ARTHROSCOPY Left 6-7 years ago   LEFT HEART CATH AND CORONARY ANGIOGRAPHY N/A 04/17/2017   Procedure: LEFT HEART CATH AND CORONARY ANGIOGRAPHY;  Surgeon: Troy Sine, MD;  Location: [place] [facility];  Service: Cardiovascular;  Laterality: N/A;   RADIOLOGY WITH ANESTHESIA N/A 08/30/2020   Procedure: MRI WITH ANESTHESIA BRAIN WITH AND WITHOUT AND MRI CERVICAL SPINE WITH AND WITHOUT;  Surgeon: Radiologist, Medication, MD;  Location: [place];  Service: Radiology;  Laterality: N/A;   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   TONSILLECTOMY     TOTAL KNEE ARTHROPLASTY Left 10/08/2016   Procedure: LEFT TOTAL KNEE ARTHROPLASTY;  Surgeon: Gaynelle Arabian, MD;  Location: [facility];  Service: Orthopedics;  Laterality: Left;   TOTAL KNEE ARTHROPLASTY Right 06/17/2017   Procedure: RIGHT TOTAL KNEE  ARTHROPLASTY;  Surgeon: Gaynelle Arabian, MD;  Location: [facility];  Service: Orthopedics;  Laterality: Right;   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   Essential tremor 07/13/2021   Gallstones 07/13/2021   Gastro-esophageal reflux disease with esophagitis, without bleeding 07/13/2021   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   Personal history of colonic polyps 07/13/2021   Seasonal allergic rhinitis 07/13/2021   Primary open-angle glaucoma, left eye, mild stage 05/09/2021   Disequilibrium 09/27/2020   Cataract, nuclear sclerotic, both eyes 07/06/2020   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/2020   Tremor 01/21/2020   Polyneuropathy 01/21/2020   Stuffy ears, bilateral 01/21/2020   Otalgia of both ears 01/21/2020   Other headache syndrome 01/21/2020   Carotid stenosis, asymptomatic, bilateral 01/21/2020   Episodic lightheadedness 05/28/2019   Ascending aortic aneurysm (HCC) 4.2 cm on CT from January 2020 05/28/2019   Snoring 05/28/2019   Pleurisy 04/10/2018   Abnormal nuclear stress test    Exertional dyspnea    Abnormal myocardial perfusion study 04/11/2017   Chest pain in adult 03/18/2017   Edema 03/18/2017   Mild CAD 03/18/2017   Peripheral  vascular disease ([place])    Painful urination    Osteoarthritis of knee    Hypertension    Hyperlipidemia    GERD (gastroesophageal reflux disease)    Fibromyalgia    Arthritis    OA (osteoarthritis) of knee 10/08/2016    ONSET DATE: 06/07/2022 (MD referral); tremor started 2016  REFERRING DIAG:  G20.A1 (ICD-10-CM) - Parkinson's disease without dyskinesia or fluctuating manifestations  M54.2 (ICD-10-CM) - Neck pain    THERAPY DIAG:  Other abnormalities of gait and  mobility  Unsteadiness on feet  Abnormal posture  Rationale for Evaluation and Treatment: Rehabilitation  SUBJECTIVE:                                                                                                                                                                                             SUBJECTIVE STATEMENT: Just having some issues with pollen.  Just stiff.  Using the bike at home several times this week, up to 30 minutes.  Pt accompanied by: self  PERTINENT HISTORY: HTN, lightheadedness, neck pain, arthritis, GERD, glaucoma, OA knee s/p L TKR 2018 and R TKR 2019, PVD, polyneuropahty  PAIN:  Are you having pain? No  PRECAUTIONS: Fall  WEIGHT BEARING RESTRICTIONS: No  FALLS: Has patient fallen in last 6 months? No  LIVING ENVIRONMENT: Lives with: lives with their spouse Lives in: House/apartment Stairs: Yes: Internal: 12 steps; on right going up Bedroom on first floor Has following equipment at home: Single point cane and Walker - 2 wheeled  PLOF: Independent and Leisure: Enjoys yardwork  Has exercise bike at home.  Has used 30 min/day.  Has aerobic step  PATIENT GOALS: To work on getting legs stronger.  To loosen up with neck.  OBJECTIVE:    TODAY'S TREATMENT: 06/28/2022  Neuro Re-education:  Reviewed PWR! Moves in seated position x 10 reps, with initial 2-3 reps for position/warm up   PWR! Up for improved posture  PWR! Rock for improved weighshifting  PWR! Twist for improved trunk rotation   PWR! Step for improved step initiation   Good performance of PWR! Moves for HEP.    Pt performs PWR! Moves in standing position x 10 reps with several initial reps for warm-up each position   PWR! Up for improved posture  PWR! Rock for improved weighshifting  PWR! Twist for improved trunk rotation   PWR! Step for improved step initiation   Cues provided for technique, intensity  Forward/back walking x 3 reps, 20 ft, with min guard.  1 episode  of decreased L foot clearance.  He tends to veer to L with backwards walking.  Therapeutic Exercise:  Seated hamstring stretch, 30 sec x 3 reps Cervical rotation, 15 sec x 3 reps, R side noted more tightness Chin tucks, 5 x 5 reps Standing gastroc stretch-runner's stretch, 1 x 15 sec with cues for technique, then transitioned to propping foot at 4" block, 2 x 15 sec  NuStep, Level 4, x 8 minutes keeping SPM >90 for aerobic activity and strengthening.   Access Code: [email or web address] URL: [email or web address] Date: 06/28/2022 Prepared by: [facility]  Exercises - Seated Hamstring Stretch  - 1-2 x daily - 7 x weekly - 1 sets - 3 reps - 30 sec hold - Standing Gastroc Stretch on Step  - 1-2 x daily - 7 x weekly - 1 sets - 3 reps - 30 sec hold - Seated Cervical Rotation AROM  - 1-2 x daily - 7 x weekly - 1 sets - 3 reps - 15 sec hold  PWR! Moves seated, 1x/day 10-20 reps  PATIENT EDUCATION: Education details: Stretches added to [employer] Person educated: Patient; spouse at end of session Education method: Explanation, Demonstration, and Handouts Education comprehension: verbalized understanding, returned demonstration, and needs further education  ---------------------------------------------------------------------------- Objective measures below taken at initial evaluation:  DIAGNOSTIC FINDINGS: CT head was normal for age.   CT cervical showed some DJD with moderate foraminal narrowing at C3C4 but no severe changes.     MRI of the brain 5/312023 was normal for age - mild atrophy and small vessel changes.  Nothing acute.     MRI of the cervical spine 08/30/2021 showed a normal spinal cord.  At [facility] he has mild spinal stenosis due to disc bulging and spondylosis.  Also foraminal narrowing.  A few small bulges elsewhere without sinal stenosis.    COGNITION: Overall cognitive status: Within functional limits for tasks assessed   SENSATION: Light  touch: WFL  EDEMA:  Noted LLE >RLE; worse in dependent positions  MUSCLE TONE: WFL BLEs slight increased initially LLE  POSTURE: rounded shoulders and forward head  LOWER EXTREMITY ROM:   tightness noted in sitting R hamstrings, not fully extension  Active  Right Eval Left Eval  Hip flexion    Hip extension    Hip abduction    Hip adduction    Hip internal rotation    Hip external rotation    Knee flexion    Knee extension    Ankle dorsiflexion 20 11  Ankle plantarflexion    Ankle inversion    Ankle eversion     (Blank rows = not tested)  Neck A/ROM: Flexion  45 degrees; reports tightness Extension  30 degrees R rotation  38 degrees  L rotation  50 degrees LOWER EXTREMITY MMT:    MMT Right Eval Left Eval  Hip flexion 5/5 5/5  Hip extension    Hip abduction    Hip adduction    Hip internal rotation    Hip external rotation    Knee flexion 5/5 5/5  Knee extension 5/5 5/5  Ankle dorsiflexion 4+/5 4/5  Ankle plantarflexion    Ankle inversion    Ankle eversion    (Blank rows = not tested)  TRANSFERS: Assistive device utilized: None  Sit to stand: Modified independence  GAIT: Gait pattern: step through pattern, decreased arm swing- Left, decreased step length- Right, and decreased step length- Left Distance walked: 50 ft Assistive device utilized: None Level of assistance: Modified independence Comments: Pt reports shuffling more in home, when more aware, he tends to not shuffle  FUNCTIONAL TESTS:  5 times sit to stand: 10.53 sec arms crossed at chest Timed up and go (TUG): 10.75 sec 10 meter walk test: 10.84 sec  (3.03 ft/sec) TUG man:10.56 sec TUG cog:  11.22 sec 65M back:  9.09 MiniBESTest:  25/28  360 to L:  7 steps, 4.25 sec; to R:  7 steps, 3.65 sec   TODAY'S TREATMENT:                                                                                                                              DATE: 06/19/2022    PATIENT EDUCATION: Education  details: Eval results, POC Person educated: Patient and Spouse Education method: Explanation Education comprehension: verbalized understanding  HOME EXERCISE PROGRAM: Not yet initiated  GOALS: Goals reviewed with patient? Yes  SHORT TERM GOALS: Target date: 07/06/2022  Pt will be independent with HEP for improved balance, gait. Baseline: Goal status: IN PROGRESS   LONG TERM GOALS: Target date: 07/20/2022  Pt will be independent with progression of HEP for improved balance, gait. Baseline:  Goal status: IN PROGRESS  2.  Pt will improve 65M walk backwards to <6 seconds for decreased fall risk. Baseline: 9.09 sec Goal status: IN PROGRESS  3.  Pt will improve neck flexibility in extension and rotation by at least 5 degrees for improved neck flexibility/participation in ADLS. Baseline:  Goal status: IN PROGRESS  4.  Pt will verbalize understanding of local Parkinson's disease community resources, including optimal fitness program options upon d/c from PT.   Baseline:  Goal status: IN PROGRESS  ASSESSMENT:  CLINICAL IMPRESSION: Skilled PT session today reviewed again seated PWR! Moves, with better overall performance and understanding today.  Worked also on standing PWR! Moves (will plan to formally add next visit after one more trial in PT session).  Also addressed stiffness with flexibility and stretching exercises; added to HEP.  Pt overall reports improved mobility.  He will continue to benefit from skilled PT towards goals for improved overall functional mobility and decreased fall risk.  OBJECTIVE IMPAIRMENTS: Abnormal gait, decreased balance, decreased mobility, decreased ROM, decreased strength, impaired flexibility, and postural dysfunction.   ACTIVITY LIMITATIONS: carrying, standing, and locomotion level  PARTICIPATION LIMITATIONS: community activity and yard work  PERSONAL FACTORS: 3+ comorbidities: see PMH above  are also affecting patient's functional outcome.    REHAB POTENTIAL: Good  CLINICAL DECISION MAKING: Evolving/moderate complexity  EVALUATION COMPLEXITY: Moderate  PLAN:  PT FREQUENCY: 2x/week  PT DURATION: other: 5 weeks, including eval week  PLANNED INTERVENTIONS: Therapeutic exercises, Therapeutic activity, Neuromuscular re-education, Balance training, Gait training, Patient/Family education, Self Care, and Manual therapy  PLAN FOR NEXT SESSION: Review stretches, PWR! Moves in standing to add to HEP; gait training for arm swing, stride length; optimal fitness program -flexibility, strength, gait, balance, PWR! Moves, use of bike for aerobic activity at home; walking program   Frazier Butt., PT 06/28/2022, 8:46 AM  [facility] [address], [place], [place] [address] Phone # [phone] Fax # [phone]

## 2022-07-03 ENCOUNTER — Ambulatory Visit: Payer: Medicare Other | Attending: Neurology | Admitting: Physical Therapy

## 2022-07-03 ENCOUNTER — Encounter: Payer: Self-pay | Admitting: Physical Therapy

## 2022-07-03 DIAGNOSIS — R2689 Other abnormalities of gait and mobility: Secondary | ICD-10-CM

## 2022-07-03 DIAGNOSIS — R29818 Other symptoms and signs involving the nervous system: Secondary | ICD-10-CM | POA: Insufficient documentation

## 2022-07-03 DIAGNOSIS — R972 Elevated prostate specific antigen [PSA]: Secondary | ICD-10-CM | POA: Diagnosis not present

## 2022-07-03 DIAGNOSIS — R293 Abnormal posture: Secondary | ICD-10-CM | POA: Diagnosis not present

## 2022-07-03 DIAGNOSIS — R2681 Unsteadiness on feet: Secondary | ICD-10-CM | POA: Diagnosis not present

## 2022-07-03 NOTE — Therapy (Signed)
OUTPATIENT PHYSICAL THERAPY NEURO TREATMENT NOTE   Patient Name: Todd Chan MRN: [id number] DOB:Feb 13, 1946, 77 y.o., male Today's Date: 07/03/2022   PCP: Lyman Bishop, DO REFERRING PROVIDER:   Britt Bottom, MD    END OF SESSION:  PT End of Session - 07/03/22 1227     Visit Number 5    Number of Visits 10    Date for PT Re-Evaluation 07/20/22    Authorization Type Medicare    PT Start Time 1229    PT Stop Time 1309    PT Time Calculation (min) 40 min    Activity Tolerance Patient tolerated treatment well    Behavior During Therapy [facility] for tasks assessed/performed               Past Medical History:  Diagnosis Date   Arthritis    GERD (gastroesophageal reflux disease)    Glaucoma    Hypertension    Osteoarthritis of knee    Painful urination    Peripheral vascular disease    bilateral edema    Past Surgical History:  Procedure Laterality Date   CARDIAC CATHETERIZATION  04/2017   CATARACT EXTRACTION, BILATERAL Bilateral    Left: 10/30/21, right: 11/20/21.   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   KNEE ARTHROSCOPY Left 6-7 years ago   LEFT HEART CATH AND CORONARY ANGIOGRAPHY N/A 04/17/2017   Procedure: LEFT HEART CATH AND CORONARY ANGIOGRAPHY;  Surgeon: Troy Sine, MD;  Location: [place] [facility];  Service: Cardiovascular;  Laterality: N/A;   RADIOLOGY WITH ANESTHESIA N/A 08/30/2020   Procedure: MRI WITH ANESTHESIA BRAIN WITH AND WITHOUT AND MRI CERVICAL SPINE WITH AND WITHOUT;  Surgeon: Radiologist, Medication, MD;  Location: [place];  Service: Radiology;  Laterality: N/A;   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   TONSILLECTOMY     TOTAL KNEE ARTHROPLASTY Left 10/08/2016   Procedure: LEFT TOTAL KNEE ARTHROPLASTY;  Surgeon: Gaynelle Arabian, MD;  Location: [facility];  Service: Orthopedics;  Laterality: Left;   TOTAL KNEE ARTHROPLASTY Right 06/17/2017   Procedure: RIGHT TOTAL KNEE  ARTHROPLASTY;  Surgeon: Gaynelle Arabian, MD;  Location: [facility];  Service: Orthopedics;  Laterality: Right;   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   Essential tremor 07/13/2021   Gallstones 07/13/2021   Gastro-esophageal reflux disease with esophagitis, without bleeding 07/13/2021   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   Personal history of colonic polyps 07/13/2021   Seasonal allergic rhinitis 07/13/2021   Primary open-angle glaucoma, left eye, mild stage 05/09/2021   Disequilibrium 09/27/2020   Cataract, nuclear sclerotic, both eyes 07/06/2020   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/2020   Tremor 01/21/2020   Polyneuropathy 01/21/2020   Stuffy ears, bilateral 01/21/2020   Otalgia of both ears 01/21/2020   Other headache syndrome 01/21/2020   Carotid stenosis, asymptomatic, bilateral 01/21/2020   Episodic lightheadedness 05/28/2019   Ascending aortic aneurysm (HCC) 4.2 cm on CT from January 2020 05/28/2019   Snoring 05/28/2019   Pleurisy 04/10/2018   Abnormal nuclear stress test    Exertional dyspnea    Abnormal myocardial perfusion study 04/11/2017   Chest pain in adult 03/18/2017   Edema 03/18/2017   Mild CAD 03/18/2017   Peripheral vascular disease  Painful urination    Osteoarthritis of knee    Hypertension    Hyperlipidemia    GERD (gastroesophageal reflux disease)    Fibromyalgia    Arthritis    OA (osteoarthritis) of knee 10/08/2016    ONSET DATE: 06/07/2022 (MD referral); tremor started 2016  REFERRING DIAG:  G20.A1 (ICD-10-CM) - Parkinson's disease without dyskinesia or fluctuating manifestations  M54.2 (ICD-10-CM) - Neck pain    THERAPY DIAG:  Other abnormalities of gait and mobility  Unsteadiness on  feet  Abnormal posture  Other symptoms and signs involving the nervous system  Rationale for Evaluation and Treatment: Rehabilitation  SUBJECTIVE:                                                                                                                                                                                             SUBJECTIVE STATEMENT: Had bloodwork done earlier today.  Feel a little tired and stiff today.  Did the stretches.  Pt accompanied by: self  PERTINENT HISTORY: HTN, lightheadedness, neck pain, arthritis, GERD, glaucoma, OA knee s/p L TKR 2018 and R TKR 2019, PVD, polyneuropahty  PAIN:  Are you having pain? No  PRECAUTIONS: Fall  WEIGHT BEARING RESTRICTIONS: No  FALLS: Has patient fallen in last 6 months? No  LIVING ENVIRONMENT: Lives with: lives with their spouse Lives in: House/apartment Stairs: Yes: Internal: 12 steps; on right going up Bedroom on first floor Has following equipment at home: Single point cane and Walker - 2 wheeled  PLOF: Independent and Leisure: Enjoys yardwork  Has exercise bike at home.  Has used 30 min/day.  Has aerobic step  PATIENT GOALS: To work on getting legs stronger.  To loosen up with neck.  OBJECTIVE:   TODAY'S TREATMENT: 07/03/2022 Activity Comments  NuStep, Level 3, x 8 minutes  SPM >90 for aerobic activity and strengthening  Seated hamstring stretch, 3 x 30 sec Good form  Standing gastroc stretch, 3 x 30 sec Good form  Seated neck rotation, 3 x 30 sec Good form  Standing chin tuck with towel behind head, 5 reps   Dynamic balance exercises at counter:  forward/back walking -forward marching -forward tandem -sidestepping No LOB, good foot clearance Occasional cues for posture  Standing on [employer]: EO head turns/head nods, then EC head steady 30 sec; EC head turns/nods x 5  Light UE support, increased unsteadiness with EC  Gait 50 ft x 8 reps with emphasis on reciprocal arm swing Cues for focus on backward push  of swing, then to relax to swing forward, with improvements noted in arm swing        Access  Code: [email or web address] URL: [email or web address] Date: 06/28/2022 Prepared by: [facility]  Exercises - Seated Hamstring Stretch  - 1-2 x daily - 7 x weekly - 1 sets - 3 reps - 30 sec hold - Standing Gastroc Stretch on Step  - 1-2 x daily - 7 x weekly - 1 sets - 3 reps - 30 sec hold - Seated Cervical Rotation AROM  - 1-2 x daily - 7 x weekly - 1 sets - 3 reps - 15 sec hold  PWR! Moves seated, 1x/day 10-20 reps  ---------------------------------------------------------------------------- Objective measures below taken at initial evaluation:  DIAGNOSTIC FINDINGS: CT head was normal for age.   CT cervical showed some DJD with moderate foraminal narrowing at C3C4 but no severe changes.     MRI of the brain 5/312023 was normal for age - mild atrophy and small vessel changes.  Nothing acute.     MRI of the cervical spine 08/30/2021 showed a normal spinal cord.  At [facility] he has mild spinal stenosis due to disc bulging and spondylosis.  Also foraminal narrowing.  A few small bulges elsewhere without sinal stenosis.    COGNITION: Overall cognitive status: Within functional limits for tasks assessed   SENSATION: Light touch: WFL  EDEMA:  Noted LLE >RLE; worse in dependent positions  MUSCLE TONE: WFL BLEs slight increased initially LLE  POSTURE: rounded shoulders and forward head  LOWER EXTREMITY ROM:   tightness noted in sitting R hamstrings, not fully extension  Active  Right Eval Left Eval  Hip flexion    Hip extension    Hip abduction    Hip adduction    Hip internal rotation    Hip external rotation    Knee flexion    Knee extension    Ankle dorsiflexion 20 11  Ankle plantarflexion    Ankle inversion    Ankle eversion     (Blank rows = not tested)  Neck A/ROM: Flexion  45 degrees; reports tightness Extension  30 degrees R  rotation  38 degrees  L rotation  50 degrees LOWER EXTREMITY MMT:    MMT Right Eval Left Eval  Hip flexion 5/5 5/5  Hip extension    Hip abduction    Hip adduction    Hip internal rotation    Hip external rotation    Knee flexion 5/5 5/5  Knee extension 5/5 5/5  Ankle dorsiflexion 4+/5 4/5  Ankle plantarflexion    Ankle inversion    Ankle eversion    (Blank rows = not tested)  TRANSFERS: Assistive device utilized: None  Sit to stand: Modified independence  GAIT: Gait pattern: step through pattern, decreased arm swing- Left, decreased step length- Right, and decreased step length- Left Distance walked: 50 ft Assistive device utilized: None Level of assistance: Modified independence Comments: Pt reports shuffling more in home, when more aware, he tends to not shuffle  FUNCTIONAL TESTS:  5 times sit to stand: 10.53 sec arms crossed at chest Timed up and go (TUG): 10.75 sec 10 meter walk test: 10.84 sec  (3.03 ft/sec) TUG man:10.56 sec TUG cog:  11.22 sec 34M back:  9.09 MiniBESTest:  25/28  360 to L:  7 steps, 4.25 sec; to R:  7 steps, 3.65 sec   TODAY'S TREATMENT:  DATE: 06/19/2022    PATIENT EDUCATION: Education details: Eval results, POC Person educated: Patient and Spouse Education method: Explanation Education comprehension: verbalized understanding  HOME EXERCISE PROGRAM: Not yet initiated  GOALS: Goals reviewed with patient? Yes  SHORT TERM GOALS: Target date: 07/06/2022  Pt will be independent with HEP for improved balance, gait. Baseline: Goal status: GOAL MET, 07/03/2022   LONG TERM GOALS: Target date: 07/20/2022  Pt will be independent with progression of HEP for improved balance, gait. Baseline:  Goal status: IN PROGRESS  2.  Pt will improve 69M walk backwards to <6 seconds for decreased fall risk. Baseline: 9.09  sec Goal status: IN PROGRESS  3.  Pt will improve neck flexibility in extension and rotation by at least 5 degrees for improved neck flexibility/participation in ADLS. Baseline:  Goal status: IN PROGRESS  4.  Pt will verbalize understanding of local Parkinson's disease community resources, including optimal fitness program options upon d/c from PT.   Baseline:  Goal status: IN PROGRESS  ASSESSMENT:  CLINICAL IMPRESSION: Reviewed additions to HEP this visit, with pt return demo understanding of stretches added last session.  He arrives today, reporting just having come from bloodwork (requiring fasting), and reports some stiffness.  Overall, he reports and PT notes improvements in mobility and decreased stiffness after exercises.  He continues to have LUE tremor and stiffness, affecting reciprocal armswing with gait.  He will continue to benefit from skilled PT towards LTGs for improved functional mobility and decreased fall risk.  OBJECTIVE IMPAIRMENTS: Abnormal gait, decreased balance, decreased mobility, decreased ROM, decreased strength, impaired flexibility, and postural dysfunction.   ACTIVITY LIMITATIONS: carrying, standing, and locomotion level  PARTICIPATION LIMITATIONS: community activity and yard work  PERSONAL FACTORS: 3+ comorbidities: see PMH above  are also affecting patient's functional outcome.   REHAB POTENTIAL: Good  CLINICAL DECISION MAKING: Evolving/moderate complexity  EVALUATION COMPLEXITY: Moderate  PLAN:  PT FREQUENCY: 2x/week  PT DURATION: other: 5 weeks, including eval week  PLANNED INTERVENTIONS: Therapeutic exercises, Therapeutic activity, Neuromuscular re-education, Balance training, Gait training, Patient/Family education, Self Care, and Manual therapy  PLAN FOR NEXT SESSION:  PWR! Moves in standing to add to HEP; gait training for arm swing, stride length; optimal fitness program -flexibility, strength, gait, balance, PWR! Moves, use of bike for  aerobic activity at home; walking program   Frazier Butt., PT 07/03/2022, 1:09 PM  [facility] at [facility] [place], [place], [place] [address] Phone # [phone] Fax # [phone]

## 2022-07-05 ENCOUNTER — Ambulatory Visit: Payer: Medicare Other | Admitting: Physical Therapy

## 2022-07-05 ENCOUNTER — Encounter: Payer: Self-pay | Admitting: Physical Therapy

## 2022-07-05 DIAGNOSIS — R2681 Unsteadiness on feet: Secondary | ICD-10-CM

## 2022-07-05 DIAGNOSIS — R2689 Other abnormalities of gait and mobility: Secondary | ICD-10-CM | POA: Diagnosis not present

## 2022-07-05 DIAGNOSIS — R293 Abnormal posture: Secondary | ICD-10-CM | POA: Diagnosis not present

## 2022-07-05 DIAGNOSIS — R29818 Other symptoms and signs involving the nervous system: Secondary | ICD-10-CM

## 2022-07-05 NOTE — Patient Instructions (Signed)
WALKING  Walking is a great form of exercise to increase your strength, endurance and overall fitness.  A walking program can help you start slowly and gradually build endurance as you go.  Everyone's ability is different, so each person's starting point will be different.  You do not have to follow them exactly.  The are just samples. You should simply find out what's right for you and stick to that program.   In the beginning, you'll start off walking 2-3 times a day for short distances.  As you get stronger, you'll be walking further at just 1-2 times per day.  A. You Can Walk For A Certain Length Of Time Each Day    Walk 5 minutes 3 times per day.  Increase 2 minutes every 2 days (3 times per day).  Work up to 25-30 minutes (1-2 times per day).   Example:   Day 1-2 5 minutes 3 times per day   Day 7-8 12 minutes 2-3 times per day   Day 13-14 25 minutes 1-2 times per day     Optimal Fitness Program after Therapy for People with Parkinson's Disease  1)  Therapy Home Exercise Program  -Do these Exercises DAILY as instructed by your therapist  -Big, deliberate effort with exercises  -These exercises are important to perform consistently, even when therapist has  finished, because these therapy exercises often address your specific  Parkinson's difficulties   2)  Walking  -  Work up to walking 3-5 times per week, 20-30 minutes per day  -This can be done at home, driveway, quiet street or an indoor track  -Focus should be on your Best posture, arm swing, step length for your best  walking pattern  3)  Aerobic Exercise  -Work up to 3-5 times per week, 30 minutes per day  -This can be stationary bike, seated stepper machine, elliptical machine  -Work up to 7-8/10 intensity during the exercise, at minimal to moderate     Resistance

## 2022-07-05 NOTE — Therapy (Addendum)
OUTPATIENT PHYSICAL THERAPY NEURO TREATMENT NOTE   Patient Name: Todd Chan MRN: [id number] DOB:26-Feb-1946, 77 y.o., male Today's Date: 07/05/2022   PCP: Lyman Bishop, DO REFERRING PROVIDER:   Britt Bottom, MD    END OF SESSION:  PT End of Session - 07/05/22 0845     Visit Number 6    Number of Visits 10    Date for PT Re-Evaluation 07/20/22    Authorization Type Medicare    PT Start Time 0845    PT Stop Time 0926    PT Time Calculation (min) 41 min    Activity Tolerance Patient tolerated treatment well    Behavior During Therapy [facility] for tasks assessed/performed               Past Medical History:  Diagnosis Date   Arthritis    GERD (gastroesophageal reflux disease)    Glaucoma    Hypertension    Osteoarthritis of knee    Painful urination    Peripheral vascular disease    bilateral edema    Past Surgical History:  Procedure Laterality Date   CARDIAC CATHETERIZATION  04/2017   CATARACT EXTRACTION, BILATERAL Bilateral    Left: 10/30/21, right: 11/20/21.   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   TONSILLECTOMY     TOTAL KNEE ARTHROPLASTY Left 10/08/2016   Procedure: LEFT TOTAL KNEE ARTHROPLASTY;  Surgeon: Gaynelle Arabian, MD;  Location: [facility];  Service: Orthopedics;  Laterality: Left;   TOTAL KNEE ARTHROPLASTY Right 06/17/2017   Procedure: RIGHT TOTAL KNEE  ARTHROPLASTY;  Surgeon: Gaynelle Arabian, MD;  Location: [facility];  Service: Orthopedics;  Laterality: Right;   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   Essential tremor 07/13/2021   Gallstones 07/13/2021   Gastro-esophageal reflux disease with esophagitis, without bleeding 07/13/2021   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   Personal history of colonic polyps 07/13/2021   Seasonal allergic rhinitis 07/13/2021   Primary open-angle glaucoma, left eye, mild stage 05/09/2021   Disequilibrium 09/27/2020   Cataract, nuclear sclerotic, both eyes 07/06/2020   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/2020   Tremor 01/21/2020   Polyneuropathy 01/21/2020   Stuffy ears, bilateral 01/21/2020   Otalgia of both ears 01/21/2020   Other headache syndrome 01/21/2020   Carotid stenosis, asymptomatic, bilateral 01/21/2020   Episodic lightheadedness 05/28/2019   Ascending aortic aneurysm (HCC) 4.2 cm on CT from January 2020 05/28/2019   Snoring 05/28/2019   Pleurisy 04/10/2018   Abnormal nuclear stress test    Exertional dyspnea    Abnormal myocardial perfusion study 04/11/2017   Chest pain in adult 03/18/2017   Edema 03/18/2017   Mild CAD 03/18/2017   Peripheral vascular disease  Painful urination    Osteoarthritis of knee    Hypertension    Hyperlipidemia    GERD (gastroesophageal reflux disease)    Fibromyalgia    Arthritis    OA (osteoarthritis) of knee 10/08/2016    ONSET DATE: 06/07/2022 (MD referral); tremor started 2016  REFERRING DIAG:  G20.A1 (ICD-10-CM) - Parkinson's disease without dyskinesia or fluctuating manifestations  M54.2 (ICD-10-CM) - Neck pain    THERAPY DIAG:  Unsteadiness on feet  Other abnormalities of gait and  mobility  Abnormal posture  Other symptoms and signs involving the nervous system  Rationale for Evaluation and Treatment: Rehabilitation  SUBJECTIVE:                                                                                                                                                                                             SUBJECTIVE STATEMENT: Have had a good week. Pt accompanied by: self  PERTINENT HISTORY: HTN, lightheadedness, neck pain, arthritis, GERD, glaucoma, OA knee s/p L TKR 2018 and R TKR 2019, PVD, polyneuropahty  PAIN:  Are you having pain? No  PRECAUTIONS: Fall  WEIGHT BEARING RESTRICTIONS: No  FALLS: Has patient fallen in last 6 months? No  LIVING ENVIRONMENT: Lives with: lives with their spouse Lives in: House/apartment Stairs: Yes: Internal: 12 steps; on right going up Bedroom on first floor Has following equipment at home: Single point cane and Walker - 2 wheeled  PLOF: Independent and Leisure: Enjoys yardwork  Has exercise bike at home.  Has used 30 min/day.  Has aerobic step  PATIENT GOALS: To work on getting legs stronger.  To loosen up with neck.  OBJECTIVE:    TODAY'S TREATMENT: 07/05/2022 Activity Comments  NuStep, Level 4, x 8 minutes SPM >90 for aerobic activity and strengthening  Forward step and weightshift 10 reps   Back step and weightshift x 10 reps   Standing on Airex beam: -heel/toe raises x 10 -alt forward step taps x 10 -alt back step taps x 10 -forward/back step through x 10 -alt side step taps x 10 -feet apart/feet together EO head turns/nods, then EC head steady x 15 sec   Gait 500 ft indoor surfaces, initial cues for increased step length and arm swing       Pt performs PWR! Moves in standing position x 10 reps, initial 2-3 reps for warm-up   PWR! Up for improved posture  PWR! Rock for improved weighshifting  PWR! Twist for improved trunk rotation   PWR! Step for improved step initiation   Cues provided  for technique, intensity.    PATIENT EDUCATION: Education details: Optimal fitness routine-daily HEP,  15-30 minutes on bike 4x/wk; discussed and educated on walking program for exercise-start with 5 minutes, then ad 1-2 minutes every several days with goal to be walking at park 3x/wk for 20-30 minutes Person educated: Patient Education method: Explanation and Handouts Education comprehension: verbalized understanding   Access Code: [email or web address] URL: [email or web address] Date: 06/28/2022>07/05/2022 Prepared by: [facility]  Exercises - Seated Hamstring Stretch  - 1-2 x daily - 7 x weekly - 1 sets - 3 reps - 30 sec hold - Standing Gastroc Stretch on Step  - 1-2 x daily - 7 x weekly - 1 sets - 3 reps - 30 sec hold - Seated Cervical Rotation AROM  - 1-2 x daily - 7 x weekly - 1 sets - 3 reps - 15 sec hold  PWR! Moves seated, 1x/day 10-20 reps PWR! Moves standing, 1x/day, 10-20 reps  ---------------------------------------------------------------------------- Objective measures below taken at initial evaluation:  DIAGNOSTIC FINDINGS: CT head was normal for age.   CT cervical showed some DJD with moderate foraminal narrowing at C3C4 but no severe changes.     MRI of the brain 5/312023 was normal for age - mild atrophy and small vessel changes.  Nothing acute.     MRI of the cervical spine 08/30/2021 showed a normal spinal cord.  At [facility] he has mild spinal stenosis due to disc bulging and spondylosis.  Also foraminal narrowing.  A few small bulges elsewhere without sinal stenosis.    COGNITION: Overall cognitive status: Within functional limits for tasks assessed   SENSATION: Light touch: WFL  EDEMA:  Noted LLE >RLE; worse in dependent positions  MUSCLE TONE: WFL BLEs slight increased initially LLE  POSTURE: rounded shoulders and forward head  LOWER EXTREMITY ROM:   tightness noted in sitting R hamstrings, not fully extension  Active   Right Eval Left Eval  Hip flexion    Hip extension    Hip abduction    Hip adduction    Hip internal rotation    Hip external rotation    Knee flexion    Knee extension    Ankle dorsiflexion 20 11  Ankle plantarflexion    Ankle inversion    Ankle eversion     (Blank rows = not tested)  Neck A/ROM: Flexion  45 degrees; reports tightness Extension  30 degrees R rotation  38 degrees  L rotation  50 degrees LOWER EXTREMITY MMT:    MMT Right Eval Left Eval  Hip flexion 5/5 5/5  Hip extension    Hip abduction    Hip adduction    Hip internal rotation    Hip external rotation    Knee flexion 5/5 5/5  Knee extension 5/5 5/5  Ankle dorsiflexion 4+/5 4/5  Ankle plantarflexion    Ankle inversion    Ankle eversion    (Blank rows = not tested)  TRANSFERS: Assistive device utilized: None  Sit to stand: Modified independence  GAIT: Gait pattern: step through pattern, decreased arm swing- Left, decreased step length- Right, and decreased step length- Left Distance walked: 50 ft Assistive device utilized: None Level of assistance: Modified independence Comments: Pt reports shuffling more in home, when more aware, he tends to not shuffle  FUNCTIONAL TESTS:  5 times sit to stand: 10.53 sec arms crossed at chest Timed up and go (TUG): 10.75 sec 10 meter walk test: 10.84 sec  (3.03 ft/sec) TUG man:10.56 sec TUG cog:  11.22 sec 40M back:  9.09 MiniBESTest:  25/28  360 to L:  7 steps, 4.25 sec; to R:  7 steps, 3.65 sec   TODAY'S TREATMENT:                                                                                                                              DATE: 06/19/2022    PATIENT EDUCATION: Education details: Eval results, POC Person educated: Patient and Spouse Education method: Explanation Education comprehension: verbalized understanding  HOME EXERCISE PROGRAM: Not yet initiated  GOALS: Goals reviewed with patient? Yes  SHORT TERM GOALS: Target  date: 07/06/2022  Pt will be independent with HEP for improved balance, gait. Baseline: Goal status: GOAL MET, 07/03/2022   LONG TERM GOALS: Target date: 07/20/2022  Pt will be independent with progression of HEP for improved balance, gait. Baseline:  Goal status: IN PROGRESS  2.  Pt will improve 6M walk backwards to <6 seconds for decreased fall risk. Baseline: 9.09 sec Goal status: IN PROGRESS  3.  Pt will improve neck flexibility in extension and rotation by at least 5 degrees for improved neck flexibility/participation in ADLS. Baseline:  Goal status: IN PROGRESS  4.  Pt will verbalize understanding of local Parkinson's disease community resources, including optimal fitness program options upon d/c from PT.   Baseline:  Goal status: IN PROGRESS  ASSESSMENT:  CLINICAL IMPRESSION: Focus of today's session was PWR! Moves in standing, aerobic activity, and education on optimal fitness routine. He demo good understanding of PWR! Moves in standing today, with improvements noted from last performance in session; therefore added to HEP.  He will continue to benefit from skilled PT towards LTGs for improved functional mobility and decreased fall risk.  OBJECTIVE IMPAIRMENTS: Abnormal gait, decreased balance, decreased mobility, decreased ROM, decreased strength, impaired flexibility, and postural dysfunction.   ACTIVITY LIMITATIONS: carrying, standing, and locomotion level  PARTICIPATION LIMITATIONS: community activity and yard work  PERSONAL FACTORS: 3+ comorbidities: see PMH above  are also affecting patient's functional outcome.   REHAB POTENTIAL: Good  CLINICAL DECISION MAKING: Evolving/moderate complexity  EVALUATION COMPLEXITY: Moderate  PLAN:  PT FREQUENCY: 2x/week  PT DURATION: other: 5 weeks, including eval week  PLANNED INTERVENTIONS: Therapeutic exercises, Therapeutic activity, Neuromuscular re-education, Balance training, Gait training, Patient/Family education,  Self Care, and Manual therapy  PLAN FOR NEXT SESSION:  Review PWR! Moves in standing and walking program;review info on optimal fitness program.  Check LTGs next week and plan for d/c  Shamaine Mulkern W., PT 07/05/2022, 9:27 AM  [place] at [facility] [place], [place], [place] [address] Phone # [phone] Fax # [phone]

## 2022-07-10 ENCOUNTER — Ambulatory Visit: Payer: Medicare Other | Admitting: Physical Therapy

## 2022-07-10 ENCOUNTER — Encounter: Payer: Self-pay | Admitting: Physical Therapy

## 2022-07-10 DIAGNOSIS — R2689 Other abnormalities of gait and mobility: Secondary | ICD-10-CM

## 2022-07-10 DIAGNOSIS — B356 Tinea cruris: Secondary | ICD-10-CM | POA: Diagnosis not present

## 2022-07-10 DIAGNOSIS — R2681 Unsteadiness on feet: Secondary | ICD-10-CM

## 2022-07-10 DIAGNOSIS — R293 Abnormal posture: Secondary | ICD-10-CM

## 2022-07-10 DIAGNOSIS — R29818 Other symptoms and signs involving the nervous system: Secondary | ICD-10-CM

## 2022-07-10 DIAGNOSIS — R972 Elevated prostate specific antigen [PSA]: Secondary | ICD-10-CM | POA: Diagnosis not present

## 2022-07-10 DIAGNOSIS — Q540 Hypospadias, balanic: Secondary | ICD-10-CM | POA: Diagnosis not present

## 2022-07-10 NOTE — Therapy (Signed)
OUTPATIENT PHYSICAL THERAPY NEURO TREATMENT NOTE   Patient Name: Todd Chan MRN: [id number] DOB:[birth year], 77 y.o., male Today's Date: 07/10/2022   PCP: Koren ShiverMasneri, Shannon M, DO REFERRING PROVIDER:   Asa LenteSater, Richard A, MD    END OF SESSION:  PT End of Session - 07/10/22 1319     Visit Number 7    Number of Visits 10    Date for PT Re-Evaluation 07/20/22    Authorization Type Medicare    PT Start Time 1320    PT Stop Time 1358    PT Time Calculation (min) 38 min    Activity Tolerance Patient tolerated treatment well    Behavior During Therapy WFL for tasks assessed/performed                Past Medical History:  Diagnosis Date   Arthritis    GERD (gastroesophageal reflux disease)    Glaucoma    Hypertension    Osteoarthritis of knee    Painful urination    Peripheral vascular disease    bilateral edema    Past Surgical History:  Procedure Laterality Date   CARDIAC CATHETERIZATION  04/2017   CATARACT EXTRACTION, BILATERAL Bilateral    Left: 10/30/21, right: 11/20/21.   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   KNEE ARTHROSCOPY Left 6-7 years ago   LEFT HEART CATH AND CORONARY ANGIOGRAPHY N/A 04/17/2017   Procedure: LEFT HEART CATH AND CORONARY ANGIOGRAPHY;  Surgeon: Lennette BihariKelly, Thomas A, MD;  Location: [facility];  Service: Cardiovascular;  Laterality: N/A;   RADIOLOGY WITH ANESTHESIA N/A 08/30/2020   Procedure: MRI WITH ANESTHESIA BRAIN WITH AND WITHOUT AND MRI CERVICAL SPINE WITH AND WITHOUT;  Surgeon: Radiologist, Medication, MD;  Location: [facility];  Service: Radiology;  Laterality: N/A;   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   TONSILLECTOMY     TOTAL KNEE ARTHROPLASTY Left 10/08/2016   Procedure: LEFT TOTAL KNEE ARTHROPLASTY;  Surgeon: Ollen GrossAluisio, Frank, MD;  Location: [facility];  Service: Orthopedics;  Laterality: Left;   TOTAL KNEE ARTHROPLASTY Right 06/17/2017   Procedure: RIGHT TOTAL KNEE  ARTHROPLASTY;  Surgeon: Ollen GrossAluisio, Frank, MD;  Location: [facility];  Service: Orthopedics;  Laterality: Right;   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   Essential tremor 07/13/2021   Gallstones 07/13/2021   Gastro-esophageal reflux disease with esophagitis, without bleeding 07/13/2021   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   Personal history of colonic polyps 07/13/2021   Seasonal allergic rhinitis 07/13/2021   Primary open-angle glaucoma, left eye, mild stage 05/09/2021   Disequilibrium 09/27/2020   Cataract, nuclear sclerotic, both eyes 07/06/2020   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/2020   Tremor 01/21/2020   Polyneuropathy 01/21/2020   Stuffy ears, bilateral 01/21/2020   Otalgia of both ears 01/21/2020   Other headache syndrome 01/21/2020   Carotid stenosis, asymptomatic, bilateral 01/21/2020   Episodic lightheadedness 05/28/2019   Ascending aortic aneurysm (HCC) 4.2 cm on CT from January 2020 05/28/2019   Snoring 05/28/2019   Pleurisy 04/10/2018   Abnormal nuclear stress test    Exertional dyspnea    Abnormal myocardial perfusion study 04/11/2017   Chest pain in adult 03/18/2017   Edema 03/18/2017   Mild CAD 03/18/2017   Peripheral vascular  disease    Painful urination    Osteoarthritis of knee    Hypertension    Hyperlipidemia    GERD (gastroesophageal reflux disease)    Fibromyalgia    Arthritis    OA (osteoarthritis) of knee 10/08/2016    ONSET DATE: 06/07/2022 (MD referral); tremor started 2016  REFERRING DIAG:  G20.A1 (ICD-10-CM) - Parkinson's disease without dyskinesia or fluctuating manifestations  M54.2 (ICD-10-CM) - Neck pain    THERAPY DIAG:  Unsteadiness on feet  Other abnormalities of gait and  mobility  Abnormal posture  Other symptoms and signs involving the nervous system  Rationale for Evaluation and Treatment: Rehabilitation  SUBJECTIVE:                                                                                                                                                                                             SUBJECTIVE STATEMENT: Just tired from staying up late, running late at MD appointment.  Did graduate from urology with good PSA.  Feel like I'm doing better with shuffling at home. Pt accompanied by: self  PERTINENT HISTORY: HTN, lightheadedness, neck pain, arthritis, GERD, glaucoma, OA knee s/p L TKR 2018 and R TKR 2019, PVD, polyneuropahty  PAIN:  Are you having pain? No  PRECAUTIONS: Fall  WEIGHT BEARING RESTRICTIONS: No  FALLS: Has patient fallen in last 6 months? No  LIVING ENVIRONMENT: Lives with: lives with their spouse Lives in: House/apartment Stairs: Yes: Internal: 12 steps; on right going up Bedroom on first floor Has following equipment at home: Single point cane and Walker - 2 wheeled  PLOF: Independent and Leisure: Enjoys yardwork  Has exercise bike at home.  Has used 30 min/day.  Has aerobic step  PATIENT GOALS: To work on getting legs stronger.  To loosen up with neck.  OBJECTIVE:    TODAY'S TREATMENT: 07/10/2022 Activity Comments  Seated march 2 x 10 Seated LAQ 2 x 10 Difficulty with attempting opposite arm coordinated tasks  Dynamic balance: -forward/back walk x 2 minutes -sidestepping x 2 min -gait with head turns/nods x 2 min -gait with obstacle negotiation stepping over and around obstacles (L foot catches stepping over obstacles at times)       Reviewed standing PWR! Moves, 10-20 reps, pt needs min cues for technique.  Reviewed rationale for PWR! Moves  PATIENT EDUCATION: Education details: HEP, optimal fitness program-pt reports he will be using bike and walking in addition to therapy exercises.  Benefits of OT  referral-pt unsure at this time. Person educated: Patient Education method: Explanation, Demonstration, and Handouts Education comprehension: verbalized understanding and returned demonstration    Access Code: [email or web address]  URL: [email or web address] Date: 06/28/2022>07/05/2022 Prepared by: [facility] - Outpatient  Rehab - [facility]  Exercises - Seated Hamstring Stretch  - 1-2 x daily - 7 x weekly - 1 sets - 3 reps - 30 sec hold - Standing Gastroc Stretch on Step  - 1-2 x daily - 7 x weekly - 1 sets - 3 reps - 30 sec hold - Seated Cervical Rotation AROM  - 1-2 x daily - 7 x weekly - 1 sets - 3 reps - 15 sec hold  PWR! Moves seated, 1x/day 10-20 reps PWR! Moves standing, 1x/day, 10-20 reps  ---------------------------------------------------------------------------- Objective measures below taken at initial evaluation:  DIAGNOSTIC FINDINGS: CT head was normal for age.   CT cervical showed some DJD with moderate foraminal narrowing at C3C4 but no severe changes.     MRI of the brain 5/312023 was normal for age - mild atrophy and small vessel changes.  Nothing acute.     MRI of the cervical spine 08/30/2021 showed a normal spinal cord.  At [facility] he has mild spinal stenosis due to disc bulging and spondylosis.  Also foraminal narrowing.  A few small bulges elsewhere without sinal stenosis.    COGNITION: Overall cognitive status: Within functional limits for tasks assessed   SENSATION: Light touch: WFL  EDEMA:  Noted LLE >RLE; worse in dependent positions  MUSCLE TONE: WFL BLEs slight increased initially LLE  POSTURE: rounded shoulders and forward head  LOWER EXTREMITY ROM:   tightness noted in sitting R hamstrings, not fully extension  Active  Right Eval Left Eval  Hip flexion    Hip extension    Hip abduction    Hip adduction    Hip internal rotation    Hip external rotation    Knee flexion    Knee extension    Ankle dorsiflexion 20 11  Ankle  plantarflexion    Ankle inversion    Ankle eversion     (Blank rows = not tested)  Neck A/ROM:      07/10/22 Flexion  45 degrees; reports tightness 45 deg Extension  30 degrees    32 R rotation  38 degrees    55 L rotation  50 degrees    70 LOWER EXTREMITY MMT:    MMT Right Eval Left Eval  Hip flexion 5/5 5/5  Hip extension    Hip abduction    Hip adduction    Hip internal rotation    Hip external rotation    Knee flexion 5/5 5/5  Knee extension 5/5 5/5  Ankle dorsiflexion 4+/5 4/5  Ankle plantarflexion    Ankle inversion    Ankle eversion    (Blank rows = not tested)  TRANSFERS: Assistive device utilized: None  Sit to stand: Modified independence  GAIT: Gait pattern: step through pattern, decreased arm swing- Left, decreased step length- Right, and decreased step length- Left Distance walked: 50 ft Assistive device utilized: None Level of assistance: Modified independence Comments: Pt reports shuffling more in home, when more aware, he tends to not shuffle  FUNCTIONAL TESTS:  5 times sit to stand: 10.53 sec arms crossed at chest Timed up and go (TUG): 10.75 sec 10 meter walk test: 10.84 sec  (3.03 ft/sec) TUG man:10.56 sec TUG cog:  11.22 sec 77M back:  9.09 MiniBESTest:  25/28  360 to L:  7 steps, 4.25 sec; to R:  7 steps, 3.65 sec   TODAY'S TREATMENT:  DATE: 06/19/2022    PATIENT EDUCATION: Education details: Eval results, POC Person educated: Patient and Spouse Education method: Explanation Education comprehension: verbalized understanding  HOME EXERCISE PROGRAM: Not yet initiated  GOALS: Goals reviewed with patient? Yes  SHORT TERM GOALS: Target date: 07/06/2022  Pt will be independent with HEP for improved balance, gait. Baseline: Goal status: GOAL MET, 07/03/2022   LONG TERM GOALS: Target date: 07/20/2022  Pt will be  independent with progression of HEP for improved balance, gait. Baseline:  Goal status: IN PROGRESS  2.  Pt will improve 84M walk backwards to <6 seconds for decreased fall risk. Baseline: 9.09 sec Goal status: IN PROGRESS  3.  Pt will improve neck flexibility in extension and rotation by at least 5 degrees for improved neck flexibility/participation in ADLS. Baseline: See abpve; improved neck rotation x 18-20 degrees, extension by 2 degrees 07/10/2022 Goal status: GOAL PARTIALLY MET 07/10/2022  4.  Pt will verbalize understanding of local Parkinson's disease community resources, including optimal fitness program options upon d/c from PT.   Baseline:  Goal status: IN PROGRESS  ASSESSMENT:  CLINICAL IMPRESSION: Pt is demo improvement in neck flexibility, especially with neck rotation; improved by several degrees of neck extension.  Pt reports overall less stiffness in neck; most of stiffness is in shoulders.  Reviewed standing PWR! Moves, with pt return demo understanding with minimal cues.  Worked on dynamic balance exercises, with pt having minimal LOB with head motions with gait and backwards walking.  Overall, he appears to be doing HEP at home and reports decreased shuffling in the home.  Will plan for d/c from PT next visit.    OBJECTIVE IMPAIRMENTS: Abnormal gait, decreased balance, decreased mobility, decreased ROM, decreased strength, impaired flexibility, and postural dysfunction.   ACTIVITY LIMITATIONS: carrying, standing, and locomotion level  PARTICIPATION LIMITATIONS: community activity and yard work  PERSONAL FACTORS: 3+ comorbidities: see PMH above  are also affecting patient's functional outcome.   REHAB POTENTIAL: Good  CLINICAL DECISION MAKING: Evolving/moderate complexity  EVALUATION COMPLEXITY: Moderate  PLAN:  PT FREQUENCY: 2x/week  PT DURATION: other: 5 weeks, including eval week  PLANNED INTERVENTIONS: Therapeutic exercises, Therapeutic activity,  Neuromuscular re-education, Balance training, Gait training, Patient/Family education, Self Care, and Manual therapy  PLAN FOR NEXT SESSION:  Check remaining LTGs and plans for d/c from PT.  Discuss return screen/eval in 6-9 months.  (Ask again about OT).    Gean Maidens., PT 07/10/2022, 1:57 PM  Post Outpatient Rehab at [facility] [address] [place], [address] [place], [place] [address] Phone # [phone] Fax # [phone]

## 2022-07-12 ENCOUNTER — Encounter: Payer: Self-pay | Admitting: Physical Therapy

## 2022-07-12 ENCOUNTER — Ambulatory Visit: Payer: Medicare Other | Admitting: Physical Therapy

## 2022-07-12 DIAGNOSIS — R2689 Other abnormalities of gait and mobility: Secondary | ICD-10-CM | POA: Diagnosis not present

## 2022-07-12 DIAGNOSIS — R293 Abnormal posture: Secondary | ICD-10-CM

## 2022-07-12 DIAGNOSIS — R29818 Other symptoms and signs involving the nervous system: Secondary | ICD-10-CM

## 2022-07-12 DIAGNOSIS — R2681 Unsteadiness on feet: Secondary | ICD-10-CM | POA: Diagnosis not present

## 2022-07-12 NOTE — Therapy (Signed)
OUTPATIENT PHYSICAL THERAPY NEURO TREATMENT NOTE/DISCHARGE SUMMARY   Patient Name: Todd Chan MRN: [id number] DOB:[birth year], 77 y.o., male Today's Date: 07/12/2022   PCP: Koren ShiverMasneri, Shannon M, DO REFERRING PROVIDER:   Asa LenteSater, Richard A, MD    PHYSICAL THERAPY DISCHARGE SUMMARY  Visits from Start of Care: 8  Current functional level related to goals / functional outcomes: See LTGs below-pt has met 3 of 4 LTGs.   Remaining deficits: High level balance; L UE tremors   Education / Equipment: Educated in [employer], community fitness options   Patient agrees to discharge. Patient goals were met. Patient is being discharged due to meeting the stated rehab goals.  Plan for return PT, OT, speech screens in 6-9 months.  Lonia Bloodmy Kambrie Eddleman, PT 07/12/22 11:28 AM Phone: [phone] Fax: [phone]   END OF SESSION:  PT End of Session - 07/12/22 0901     Visit Number 8    Number of Visits 10    Date for PT Re-Evaluation 07/20/22    Authorization Type Medicare    PT Start Time 0858   pt running behind from previous patient   PT Stop Time 0917   d/c day-goals checked and pt is agreeable to d/c   PT Time Calculation (min) 19 min    Activity Tolerance Patient tolerated treatment well    Behavior During Therapy [facility] for tasks assessed/performed                Past Medical History:  Diagnosis Date   Arthritis    GERD (gastroesophageal reflux disease)    Glaucoma    Hypertension    Osteoarthritis of knee    Painful urination    Peripheral vascular disease    bilateral edema    Past Surgical History:  Procedure Laterality Date   CARDIAC CATHETERIZATION  04/2017   CATARACT EXTRACTION, BILATERAL Bilateral    Left: 10/30/21, right: 11/20/21.   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   KNEE ARTHROSCOPY Left 6-7 years ago   LEFT HEART CATH AND CORONARY ANGIOGRAPHY N/A 04/17/2017   Procedure: LEFT HEART CATH AND CORONARY  ANGIOGRAPHY;  Surgeon: Lennette BihariKelly, Thomas A, MD;  Location: [facility];  Service: Cardiovascular;  Laterality: N/A;   RADIOLOGY WITH ANESTHESIA N/A 08/30/2020   Procedure: MRI WITH ANESTHESIA BRAIN WITH AND WITHOUT AND MRI CERVICAL SPINE WITH AND WITHOUT;  Surgeon: Radiologist, Medication, MD;  Location: [facility] OR;  Service: Radiology;  Laterality: N/A;   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   TONSILLECTOMY     TOTAL KNEE ARTHROPLASTY Left 10/08/2016   Procedure: LEFT TOTAL KNEE ARTHROPLASTY;  Surgeon: Ollen GrossAluisio, Frank, MD;  Location: [facility];  Service: Orthopedics;  Laterality: Left;   TOTAL KNEE ARTHROPLASTY Right 06/17/2017   Procedure: RIGHT TOTAL KNEE ARTHROPLASTY;  Surgeon: Ollen GrossAluisio, Frank, MD;  Location: [facility];  Service: Orthopedics;  Laterality: Right;   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   Essential tremor 07/13/2021   Gallstones 07/13/2021   Gastro-esophageal reflux disease with esophagitis, without bleeding 07/13/2021   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   Personal history of colonic polyps 07/13/2021   Seasonal allergic rhinitis 07/13/2021   Primary open-angle glaucoma, left eye, mild stage 05/09/2021   Disequilibrium 09/27/2020   Cataract, nuclear sclerotic, both eyes 07/06/2020  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/2020   Tremor 01/21/2020   Polyneuropathy 01/21/2020   Stuffy ears, bilateral 01/21/2020   Otalgia of both ears 01/21/2020   Other headache syndrome 01/21/2020   Carotid stenosis, asymptomatic, bilateral 01/21/2020   Episodic lightheadedness 05/28/2019   Ascending aortic aneurysm (HCC) 4.2 cm on CT from January 2020 05/28/2019   Snoring 05/28/2019   Pleurisy 04/10/2018   Abnormal nuclear stress test     Exertional dyspnea    Abnormal myocardial perfusion study 04/11/2017   Chest pain in adult 03/18/2017   Edema 03/18/2017   Mild CAD 03/18/2017   Peripheral vascular disease    Painful urination    Osteoarthritis of knee    Hypertension    Hyperlipidemia    GERD (gastroesophageal reflux disease)    Fibromyalgia    Arthritis    OA (osteoarthritis) of knee 10/08/2016    ONSET DATE: 06/07/2022 (MD referral); tremor started 2016  REFERRING DIAG:  G20.A1 (ICD-10-CM) - Parkinson's disease without dyskinesia or fluctuating manifestations  M54.2 (ICD-10-CM) - Neck pain    THERAPY DIAG:  Unsteadiness on feet  Other abnormalities of gait and mobility  Abnormal posture  Other symptoms and signs involving the nervous system  Rationale for Evaluation and Treatment: Rehabilitation  SUBJECTIVE:                                                                                                                                                                                             SUBJECTIVE STATEMENT: Definitely think neck movement is better and not as much pain.  Feel L arm has been a little looser when I walk. Pt accompanied by: self  PERTINENT HISTORY: HTN, lightheadedness, neck pain, arthritis, GERD, glaucoma, OA knee s/p L TKR 2018 and R TKR 2019, PVD, polyneuropahty  PAIN:  Are you having pain? No  PRECAUTIONS: Fall  WEIGHT BEARING RESTRICTIONS: No  FALLS: Has patient fallen in last 6 months? No  LIVING ENVIRONMENT: Lives with: lives with their spouse Lives in: House/apartment Stairs: Yes: Internal: 12 steps; on right going up Bedroom on first floor Has following equipment at home: Single point cane and Walker - 2 wheeled  PLOF: Independent and Leisure: Enjoys yardwork  Has exercise bike at home.  Has used 30 min/day.  Has aerobic step  PATIENT GOALS: To work on getting legs stronger.  To loosen up with neck.  OBJECTIVE:    TODAY'S TREATMENT: 07/12/2022 Activity  Comments  TUG:  10.94 sec TUG cognitive:  9.81   5x sit<>stand:  8.97 sec Improved from 10.53 sec  9M:  9.65  sec = 3.4 ft/sec Improved from 3.03 ft/sec  40M backwards: 4.97 sec Improved from 9.09 sec         PATIENT EDUCATION: Education details: Progress towards goals, POC, and plans for d/c this visit.  Reminded pt of community Power over [employer], as well community resources for PD-specific fitness options.  Pt reports being aware as wife receives these emails.  Discussed/PT recommends return PT, OT, speech screens in 6-9 months.  Pt in agreement. Person educated: Patient Education method: Explanation Education comprehension: verbalized understanding     Access Code: [email or web address] URL: [email or web address] Date: 06/28/2022>07/05/2022 Prepared by: [facility] - [facility]  Exercises - Seated Hamstring Stretch  - 1-2 x daily - 7 x weekly - 1 sets - 3 reps - 30 sec hold - Standing Gastroc Stretch on Step  - 1-2 x daily - 7 x weekly - 1 sets - 3 reps - 30 sec hold - Seated Cervical Rotation AROM  - 1-2 x daily - 7 x weekly - 1 sets - 3 reps - 15 sec hold  PWR! Moves seated, 1x/day 10-20 reps PWR! Moves standing, 1x/day, 10-20 reps  ---------------------------------------------------------------------------- Objective measures below taken at initial evaluation:  DIAGNOSTIC FINDINGS: CT head was normal for age.   CT cervical showed some DJD with moderate foraminal narrowing at C3C4 but no severe changes.     MRI of the brain 5/312023 was normal for age - mild atrophy and small vessel changes.  Nothing acute.     MRI of the cervical spine 08/30/2021 showed a normal spinal cord.  At [facility] he has mild spinal stenosis due to disc bulging and spondylosis.  Also foraminal narrowing.  A few small bulges elsewhere without sinal stenosis.    COGNITION: Overall cognitive status: Within functional limits for tasks assessed   SENSATION: Light  touch: WFL  EDEMA:  Noted LLE >RLE; worse in dependent positions  MUSCLE TONE: WFL BLEs slight increased initially LLE  POSTURE: rounded shoulders and forward head  LOWER EXTREMITY ROM:   tightness noted in sitting R hamstrings, not fully extension  Active  Right Eval Left Eval  Hip flexion    Hip extension    Hip abduction    Hip adduction    Hip internal rotation    Hip external rotation    Knee flexion    Knee extension    Ankle dorsiflexion 20 11  Ankle plantarflexion    Ankle inversion    Ankle eversion     (Blank rows = not tested)  Neck A/ROM:      07/10/22 Flexion  45 degrees; reports tightness 45 deg Extension  30 degrees    32 R rotation  38 degrees    55 L rotation  50 degrees    70 LOWER EXTREMITY MMT:    MMT Right Eval Left Eval  Hip flexion 5/5 5/5  Hip extension    Hip abduction    Hip adduction    Hip internal rotation    Hip external rotation    Knee flexion 5/5 5/5  Knee extension 5/5 5/5  Ankle dorsiflexion 4+/5 4/5  Ankle plantarflexion    Ankle inversion    Ankle eversion    (Blank rows = not tested)  TRANSFERS: Assistive device utilized: None  Sit to stand: Modified independence  GAIT: Gait pattern: step through pattern, decreased arm swing- Left, decreased step length- Right, and decreased step length- Left Distance walked: 50 ft Assistive device utilized: None Level  of assistance: Modified independence Comments: Pt reports shuffling more in home, when more aware, he tends to not shuffle  FUNCTIONAL TESTS:  5 times sit to stand: 10.53 sec arms crossed at chest Timed up and go (TUG): 10.75 sec 10 meter walk test: 10.84 sec  (3.03 ft/sec) TUG man:10.56 sec TUG cog:  11.22 sec 20M back:  9.09 MiniBESTest:  25/28  360 to L:  7 steps, 4.25 sec; to R:  7 steps, 3.65 sec   TODAY'S TREATMENT:                                                                                                                              DATE: 06/19/2022     PATIENT EDUCATION: Education details: Eval results, POC Person educated: Patient and Spouse Education method: Explanation Education comprehension: verbalized understanding  HOME EXERCISE PROGRAM: Not yet initiated  GOALS: Goals reviewed with patient? Yes  SHORT TERM GOALS: Target date: 07/06/2022  Pt will be independent with HEP for improved balance, gait. Baseline: Goal status: GOAL MET, 07/03/2022   LONG TERM GOALS: Target date: 07/20/2022  Pt will be independent with progression of HEP for improved balance, gait.   Baseline:  Goal status: MET  2.  Pt will improve 20M walk backwards to <6 seconds for decreased fall risk. Baseline: 9.09 sec> 4.97 sec 07/12/2022 Goal status: MET  3.  Pt will improve neck flexibility in extension and rotation by at least 5 degrees for improved neck flexibility/participation in ADLS. Baseline: See abpve; improved neck rotation x 18-20 degrees, extension by 2 degrees 07/10/2022 Goal status: GOAL PARTIALLY MET 07/10/2022  4.  Pt will verbalize understanding of local Parkinson's disease community resources, including optimal fitness program options upon d/c from PT.   Baseline:  Goal status: MET  ASSESSMENT:  CLINICAL IMPRESSION: Pt has met 3 of 4 LTGs.  LTG 3 partially met for neck flexibility.  Pt does report improved neck flexibility and decreased tightness/stiffness.  He has improved backwards gait in 20M walk compared to eval; other measures (gait velocity and FTSTS) have improved as well.  He has comprehensive HEP and has been educated in optimal PD fitness and community fitness options.  He is appropriate for d/c from PT this visit.  OBJECTIVE IMPAIRMENTS: Abnormal gait, decreased balance, decreased mobility, decreased ROM, decreased strength, impaired flexibility, and postural dysfunction.   ACTIVITY LIMITATIONS: carrying, standing, and locomotion level  PARTICIPATION LIMITATIONS: community activity and yard work  PERSONAL FACTORS: 3+  comorbidities: see PMH above  are also affecting patient's functional outcome.   REHAB POTENTIAL: Good  CLINICAL DECISION MAKING: Evolving/moderate complexity  EVALUATION COMPLEXITY: Moderate  PLAN:  PT FREQUENCY: 2x/week  PT DURATION: other: 5 weeks, including eval week  PLANNED INTERVENTIONS: Therapeutic exercises, Therapeutic activity, Neuromuscular re-education, Balance training, Gait training, Patient/Family education, Self Care, and Manual therapy  PLAN FOR NEXT SESSION:  Discharge this visit.  Plan for return screen plus OT and speech screens in 6-9 months.  Gean Maidens., PT 07/12/2022, 11:26 AM  [facility] at [facility] [address] [place], [address] [place], [place] [address] Phone # [phone] Fax # [phone]

## 2022-07-18 ENCOUNTER — Encounter: Payer: Self-pay | Admitting: Physical Therapy

## 2022-07-26 ENCOUNTER — Ambulatory Visit (INDEPENDENT_AMBULATORY_CARE_PROVIDER_SITE_OTHER): Payer: Medicare Other | Admitting: Podiatry

## 2022-07-26 ENCOUNTER — Encounter: Payer: Self-pay | Admitting: Podiatry

## 2022-07-26 DIAGNOSIS — M79674 Pain in right toe(s): Secondary | ICD-10-CM | POA: Diagnosis not present

## 2022-07-26 DIAGNOSIS — I739 Peripheral vascular disease, unspecified: Secondary | ICD-10-CM

## 2022-07-26 DIAGNOSIS — M79675 Pain in left toe(s): Secondary | ICD-10-CM

## 2022-07-26 DIAGNOSIS — G609 Hereditary and idiopathic neuropathy, unspecified: Secondary | ICD-10-CM

## 2022-07-26 DIAGNOSIS — B351 Tinea unguium: Secondary | ICD-10-CM

## 2022-07-26 NOTE — Progress Notes (Signed)
  Subjective:  Patient ID: Todd Chan, male    DOB: 1946/01/29,   MRN: [id number]  No chief complaint on file.   77 y.o. male presents for concern of painful thickened and elongated toenails with a history of PVD and neuropathy. Denies diabetes. Hoping to have nails trimmed as well.  . Denies any other pedal complaints. Denies n/v/f/c.   PCP: Myles Lipps DO   Past Medical History:  Diagnosis Date   Arthritis    GERD (gastroesophageal reflux disease)    Glaucoma    Hypertension    Osteoarthritis of knee    Painful urination    Peripheral vascular disease    bilateral edema     Objective:  Physical Exam: Vascular: DP/PT pulses 1/4 bilateral. CFT <3 seconds. Normal hair growth on digits. No edema.  Skin. No lacerations or abrasions bilateral feet. Nails 1-5 are thickened discolored and elongated with subungual debris.  Musculoskeletal: MMT 5/5 bilateral lower extremities in DF, PF, Inversion and Eversion. Deceased ROM in DF of ankle joint. No pain to palpation.  Neurological: Sensation diminished to light touch. Protective sensation present to all 10 locations with SWMF.   Assessment:   1. Pain due to onychomycosis of toenails of both feet   2. Idiopathic peripheral neuropathy   3. Peripheral vascular disease        Plan:  Patient was evaluated and treated and all questions answered. Discussed neuropathy and etiology as well as treatment with patient.  -Discussed and educated patient on foot care, especially with  regards to the vascular, neurological and musculoskeletal systems.  -Discussed supportive shoes at all times and checking feet regularly.  -Nails 1-5 b/l debrided today without incident.  -Patient to return in 3 months for at risk foot care.    Louann Sjogren, DPM

## 2022-08-06 DIAGNOSIS — K219 Gastro-esophageal reflux disease without esophagitis: Secondary | ICD-10-CM | POA: Diagnosis not present

## 2022-08-06 DIAGNOSIS — E785 Hyperlipidemia, unspecified: Secondary | ICD-10-CM | POA: Diagnosis not present

## 2022-08-06 DIAGNOSIS — G20B1 Parkinson's disease with dyskinesia, without mention of fluctuations: Secondary | ICD-10-CM | POA: Diagnosis not present

## 2022-08-06 DIAGNOSIS — M5137 Other intervertebral disc degeneration, lumbosacral region: Secondary | ICD-10-CM | POA: Diagnosis not present

## 2022-08-06 DIAGNOSIS — I7789 Other specified disorders of arteries and arterioles: Secondary | ICD-10-CM | POA: Diagnosis not present

## 2022-08-06 DIAGNOSIS — Z23 Encounter for immunization: Secondary | ICD-10-CM | POA: Diagnosis not present

## 2022-08-06 DIAGNOSIS — I10 Essential (primary) hypertension: Secondary | ICD-10-CM | POA: Diagnosis not present

## 2022-08-06 DIAGNOSIS — Z0000 Encounter for general adult medical examination without abnormal findings: Secondary | ICD-10-CM | POA: Diagnosis not present

## 2022-08-06 DIAGNOSIS — G629 Polyneuropathy, unspecified: Secondary | ICD-10-CM | POA: Diagnosis not present

## 2022-08-06 DIAGNOSIS — G250 Essential tremor: Secondary | ICD-10-CM | POA: Diagnosis not present

## 2022-08-06 DIAGNOSIS — I25118 Atherosclerotic heart disease of native coronary artery with other forms of angina pectoris: Secondary | ICD-10-CM | POA: Diagnosis not present

## 2022-08-06 DIAGNOSIS — H401121 Primary open-angle glaucoma, left eye, mild stage: Secondary | ICD-10-CM | POA: Diagnosis not present

## 2022-08-13 DIAGNOSIS — J4521 Mild intermittent asthma with (acute) exacerbation: Secondary | ICD-10-CM | POA: Diagnosis not present

## 2022-08-17 ENCOUNTER — Other Ambulatory Visit: Payer: Self-pay | Admitting: Neurology

## 2022-08-20 NOTE — Telephone Encounter (Signed)
Pt last seen on 06/07/22 per note "e will continue 4 Sinemet pills a day " Follow up scheduled on 12/13/22 Last filled on 05/25/22 #360 tablets (90 day supply)

## 2022-08-28 ENCOUNTER — Other Ambulatory Visit (HOSPITAL_BASED_OUTPATIENT_CLINIC_OR_DEPARTMENT_OTHER): Payer: Self-pay | Admitting: Family Medicine

## 2022-08-28 DIAGNOSIS — J986 Disorders of diaphragm: Secondary | ICD-10-CM | POA: Diagnosis not present

## 2022-08-28 DIAGNOSIS — R58 Hemorrhage, not elsewhere classified: Secondary | ICD-10-CM | POA: Diagnosis not present

## 2022-08-28 DIAGNOSIS — I83892 Varicose veins of left lower extremities with other complications: Secondary | ICD-10-CM | POA: Diagnosis not present

## 2022-08-28 DIAGNOSIS — R0602 Shortness of breath: Secondary | ICD-10-CM | POA: Diagnosis not present

## 2022-08-28 DIAGNOSIS — R059 Cough, unspecified: Secondary | ICD-10-CM | POA: Diagnosis not present

## 2022-08-28 DIAGNOSIS — R058 Other specified cough: Secondary | ICD-10-CM

## 2022-08-29 DIAGNOSIS — I87392 Chronic venous hypertension (idiopathic) with other complications of left lower extremity: Secondary | ICD-10-CM | POA: Diagnosis not present

## 2022-09-04 DIAGNOSIS — R600 Localized edema: Secondary | ICD-10-CM | POA: Diagnosis not present

## 2022-09-04 DIAGNOSIS — I872 Venous insufficiency (chronic) (peripheral): Secondary | ICD-10-CM | POA: Diagnosis not present

## 2022-09-06 ENCOUNTER — Encounter: Payer: Self-pay | Admitting: Cardiology

## 2022-09-06 ENCOUNTER — Other Ambulatory Visit: Payer: Self-pay

## 2022-09-06 ENCOUNTER — Other Ambulatory Visit: Payer: Self-pay | Admitting: Cardiology

## 2022-09-06 DIAGNOSIS — R300 Dysuria: Secondary | ICD-10-CM | POA: Diagnosis not present

## 2022-09-06 DIAGNOSIS — R0602 Shortness of breath: Secondary | ICD-10-CM

## 2022-09-06 DIAGNOSIS — R0609 Other forms of dyspnea: Secondary | ICD-10-CM | POA: Diagnosis not present

## 2022-09-06 NOTE — Telephone Encounter (Signed)
Spoke to patient Dr.Tobb ordered a echo.Echo scheduled 6/11 at 2:30 pm at [facility] to keep appointment scheduled with Edd Fabian NP 6/14 at 1:55 pm.

## 2022-09-06 NOTE — Telephone Encounter (Signed)
Spoke to patient he stated he has been sob for the past 6 weeks.He is noticing sob is more frequent.No chest pain.He has gained 15 to 20 lbs within the past 8 to 9 months.Swelling in both lower legs.He saw PCP and was prescribed Lasix 20 mg daily.Appointment moved up with Edd Fabian 6/14 at 1:55 pm.He wanted to ask Dr.Tobb if he needs any test done before his appointment.I will send message to her.

## 2022-09-10 DIAGNOSIS — Z1330 Encounter for screening examination for mental health and behavioral disorders, unspecified: Secondary | ICD-10-CM | POA: Diagnosis not present

## 2022-09-10 DIAGNOSIS — I83819 Varicose veins of unspecified lower extremities with pain: Secondary | ICD-10-CM | POA: Diagnosis not present

## 2022-09-10 DIAGNOSIS — I83893 Varicose veins of bilateral lower extremities with other complications: Secondary | ICD-10-CM | POA: Diagnosis not present

## 2022-09-11 ENCOUNTER — Ambulatory Visit: Payer: Medicare Other | Attending: Cardiology

## 2022-09-11 DIAGNOSIS — R0602 Shortness of breath: Secondary | ICD-10-CM | POA: Diagnosis not present

## 2022-09-11 LAB — ECHOCARDIOGRAM COMPLETE: Area-P 1/2: 3.53 cm2

## 2022-09-12 NOTE — Progress Notes (Signed)
Cardiology Clinic Note   Patient Name: Todd Chan Date of Encounter: 09/14/2022  Primary Care Provider:  Koren Shiver, DO Primary Cardiologist:  Thomasene Ripple, DO  Patient Profile     Todd Chan 77 year old male presents to the clinic today for follow-up evaluation of his shortness of breath.  Past Medical History    Past Medical History:  Diagnosis Date   Arthritis    GERD (gastroesophageal reflux disease)    Glaucoma    Hypertension    Osteoarthritis of knee    Painful urination    Peripheral vascular disease (HCC)    bilateral edema    Past Surgical History:  Procedure Laterality Date   CARDIAC CATHETERIZATION  04/2017   CATARACT EXTRACTION, BILATERAL Bilateral    Left: 10/30/21, right: 11/20/21.   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   TONSILLECTOMY     TOTAL KNEE ARTHROPLASTY Left 10/08/2016   Procedure: LEFT TOTAL KNEE ARTHROPLASTY;  Surgeon: Ollen Gross, MD;  Location: [facility];  Service: Orthopedics;  Laterality: Left;   TOTAL KNEE ARTHROPLASTY Right 06/17/2017   Procedure: RIGHT TOTAL KNEE ARTHROPLASTY;  Surgeon: Ollen Gross, MD;  Location: [facility];  Service: Orthopedics;  Laterality: Right;    Allergies  No Known Allergies  History of Present Illness     Todd Chan has a PMH of coronary artery disease, ascending aortic aneurysm, metabolic syndrome,  vitamin D deficiency, and shortness of breath.  He underwent cardiac catheterization 2019 which showed which showed 50% stenosis of proximal LAD and normal LV function.  Last evaluation of his ascending aorta 06/28/2020 (CT) showed 41 mm enlargement.  He was seen in follow-up by Dr. Servando Salina on 01/25/2022.  During that time he presented with his wife.  He reported that since being seen by cardiology he had been seen by neurology for his Parkinson's disease.  He reported some upper lip numbness.  He was stable from a cardiac standpoint.  He denied chest discomfort.  A chest CT aorta was ordered as well as vitamin D and TSH.  His CT showed stable ascending aortic aneurysm measuring 4 cm and stable lab work.  Echocardiogram 09/11/2022 showed an LVEF of 60 to 65%, G1 DD, mild mitral valve regurgitation, and mild dilation of the ascending aorta measuring 40 mm.  He presents to the clinic today with his wife and states that he has increased lower extremity swelling.  He has been placed on furosemide by his PCP.  His blood pressure today is 140/80.  His EKG shows normal sinus rhythm right bundle branch block 75 bpm.  We reviewed his echocardiogram and ascending aortic dilation.  They expressed understanding.  We reviewed his Parkinson's disease.  I encouraged him to increase his physical activity as tolerated, continue his current medication regimen and we will plan follow-up in around 6 months.  Today he denies chest pain, increased shortness of breath,  palpitations, melena, hematuria, hemoptysis, diaphoresis, weakness, presyncope, syncope, orthopnea, and PND.  Home Medications    Prior to Admission medications   Medication Sig Start Date End Date Taking? Authorizing Provider  amantadine (SYMMETREL) 100 MG capsule Take 1 capsule (100 mg total) by mouth 2 (two) times daily. Patient not taking: Reported on 06/19/2022 06/07/22   Asa Lente, MD  aspirin 81 MG EC tablet 1 tablet    [provider]   carbidopa-levodopa (SINEMET IR) 25-100 MG tablet TAKE 1 TABLET 4 TIMES DAILY 08/20/22   Sater, Pearletha Furl, MD  Cholecalciferol (VITAMIN D3) 50 MCG (2000 UT) capsule Take 2,000 Units by mouth daily.    [provider]  Coenzyme Q10 (CO Q 10) 100 MG CAPS See admin instructions.    [provider]  latanoprost (XALATAN) 0.005 % ophthalmic solution Place 1 drop into the left eye at bedtime.    [provider]  metoprolol tartrate (LOPRESSOR) 25 MG tablet Take 1 tablet (25 mg total) by mouth 2 (two) times daily. 01/25/22   Tobb, Kardie, DO  Multiple Vitamin (MULTIVITAMIN) capsule Take 1 capsule by mouth daily.    [provider]  Omega-3 Fatty Acids (FISH OIL PO) Take 1,290 mg by mouth daily.    [provider]  pantoprazole (PROTONIX) 40 MG tablet 1 tablet    [provider]  pravastatin (PRAVACHOL) 20 MG tablet Take 1 tablet (20 mg total) by mouth daily. 01/25/22   Tobb, Kardie, DO  telmisartan-hydrochlorothiazide (MICARDIS HCT) 40-12.5 MG tablet Take 1 tablet by mouth daily. 01/25/22   Thomasene Ripple, DO    Family History    Family History  Problem Relation Age of Onset   Breast cancer Mother 39   Prostate cancer Father    Breast cancer Sister 9   Parkinson's disease Sister    Healthy Daughter    Neuropathy Neg Hx    He indicated that his mother is deceased. He indicated that his father is deceased. He indicated that his sister is alive. He indicated that his brother is alive. He indicated that his daughter is alive. He indicated that the status of his neg hx is unknown.  Social History    Social History   Socioeconomic History   Marital status: Married    Spouse name: Not on file   Number of children: 2   Years of education: Not on file   Highest education level: Bachelor's degree (e.g., BA, AB, BS)  Occupational History   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[employer]  Tobacco Use   Smoking status: Former     Packs/day: 1.00    Years: 20.00    Additional pack years: 0.00    Total pack years: 20.00    Types: Cigarettes    Quit date: 10/1987    Years since quitting: 34.9   Smokeless tobacco: Never   Tobacco comments:    Quit 34 years ago  Vaping Use   Vaping Use: Never used  Substance and Sexual Activity   Alcohol use: Yes    Alcohol/week: 7.0 standard drinks of alcohol    Types: 7 Glasses of wine per week    Comment: daily, 2-3 glasses scotch per day   Drug use: No   Sexual activity: Yes  Other Topics Concern   Not on file  Social History Narrative   Lives at home with his wife   Left handed   Drinks coffee (2-3 cups per day)   Retired  Social Determinants of Health   Financial Resource Strain: Not on file  Food Insecurity: Not on file  Transportation Needs: Not on file  Physical Activity: Not on file  Stress: Not on file  Social Connections: Not on file  Intimate Partner Violence: Not on file     Review of Systems    General:  No chills, fever, night sweats or weight changes.  Cardiovascular:  No chest pain, dyspnea on exertion, edema, orthopnea, palpitations, paroxysmal nocturnal dyspnea. Dermatological: No rash, lesions/masses Respiratory: No cough, dyspnea Urologic: No hematuria, dysuria Abdominal:   No nausea, vomiting, diarrhea, bright red blood per rectum, melena, or hematemesis Neurologic:  No visual changes, wkns, changes in mental status. All other systems reviewed and are otherwise negative except as noted above.  Physical Exam    VS:  BP 128/82   Pulse 75   Ht 6' (1.829 m)   Wt 294 lb 9.6 oz (133.6 kg)   SpO2 97%   BMI 39.95 kg/m  , BMI Body mass index is 39.95 kg/m. GEN: Well nourished, well developed, in no acute distress. HEENT: normal. Neck: Supple, no JVD, carotid bruits, or masses. Cardiac: RRR, no murmurs, rubs, or gallops. No clubbing, cyanosis, left greater than right 1+ pitting edema.  Radials/DP/PT 2+ and equal bilaterally.   Respiratory:  Respirations regular and unlabored, clear to auscultation bilaterally. GI: Soft, nontender, nondistended, BS + x 4. MS: no deformity or atrophy. Skin: warm and dry, no rash. Neuro:  Strength and sensation are intact. Psych: Normal affect.  Accessory Clinical Findings    Recent Labs: 01/25/2022: BUN 16; Creatinine, Ser 0.98; Potassium 4.2; Sodium 141; TSH 1.430   Recent Lipid Panel    Component Value Date/Time   CHOL 143 07/11/2020 0929   TRIG 73 07/11/2020 0929   HDL 51 07/11/2020 0929   CHOLHDL 2.8 07/11/2020 0929   LDLCALC 78 07/11/2020 0929         ECG personally reviewed by me today-normal sinus rhythm right bundle branch block 75 bpm- No acute changes   CT chest 05/2020 IMPRESSION: 1. Stable appearance of ascending thoracic aorta with mild aneurysmal dilation. Recommend annual imaging followup by CTA or MRA. This recommendation follows 2010 ACCF/AHA/AATS/ACR/ASA/SCA/SCAI/SIR/STS/SVM Guidelines for the Diagnosis and Management of Patients with Thoracic Aortic Disease. Circulation. 2010; 121: V409-W119. Aortic aneurysm NOS (ICD10-I71.9) 2. Signs of coronary artery disease as before. 3. Cholelithiasis. 4. Lobular hepatic contours, correlate with any clinical or laboratory evidence of liver disease. 5. Aortic atherosclerosis.   Aortic Atherosclerosis (ICD10-I70.0).     Electronically Signed   By: Donzetta Kohut M.D.   On: 06/28/2020 15:32   CT chest angio aorta 02/08/2022  IMPRESSION: 1. Stable mild aneurysmal disease of the ascending thoracic aorta with maximal diameter of 4 cm. This appears likely stable since 2018 and definitely stable since 2020. Extending follow-up interval could be considered given stability and minimal dilatation but strict recommendation would be for annual follow-up at this diameter. Recommend annual imaging followup by CTA or MRA. This recommendation follows 2010 ACCF/AHA/AATS/ACR/ASA/SCA/SCAI/SIR/STS/SVM  Guidelines for the Diagnosis and Management of Patients with Thoracic Aortic Disease. Circulation. 2010; 121: J478-G956. Aortic aneurysm NOS (ICD10-I71.9) 2. Stable mildly prominent lymph node in the anterior mediastinum just anterior to the ascending thoracic aorta. This remains nonspecific and appears to be stable since 2021, possibly slightly larger compared to 2020 and was blurred by motion artifact in 2018. This remains likely benign based on relative stability. 3. Stable cholelithiasis and hepatic steatosis.   Aortic aneurysm  NOS (ICD10-I71.9).     Electronically Signed   By: Irish Lack M.D.   On: 02/08/2022 15:21   LHC 04/2017 Ost LAD to Prox LAD lesion is 15% stenosed. Prox LAD lesion is 10% stenosed. The left ventricular systolic function is normal. LV end diastolic pressure is normal. The left ventricular ejection fraction is 55-65% by visual estimate.   Normal LV function without focal wall motion abnormalities.  EF 55-60%.   No significant coronary obstructive disease, although there is a small area of calcification superior to the ostium of the a short left main, 10-15% luminal irregularity of the proximal LAD and a normal ramus intermediate, left circumflex, and dominant RCA.   RECOMMENDATION: Medical therapy.  Suspect attenuation artifact contributing to the patient's abnormal nuclear stress test.  The patient will return to Dr. Norman Herrlich and will be given preoperative cardiac clearance.   Assessment & Plan   1.  DOE, SOB-echocardiogram reassuring showing normal EF and G1 DD.  Left greater than right 1+ pitting edema.  Weight stable.  Appears to be secondary to deconditioning Increase physical activity as tolerated Heart healthy low-sodium diet Continue furosemide  Ascending aortic aneurysm-denies chest and back discomfort.  Repeat chest CT aorta showed stable 4 cm aneurysm. Maintain good blood pressure control Plan for repeat CT chest aorta  11/24  Coronary artery disease-no chest pain today.  Denies recent episodes of arm neck back or chest discomfort.  Underwent cardiac catheterization in 2019 which showed mild nonobstructive CAD with proximal LAD 50% stenosis. Continue current medical therapy Heart healthy low-sodium high-fiber diet  Disposition: Follow-up with Dr. Servando Salina or me in 6 months.   Thomasene Ripple. Jerric Oyen NP-C     09/14/2022, 2:21 PM [facility] [address] Office [phone] Fax [phone]    I spent 15 minutes examining this patient, reviewing medications, and using patient centered shared decision making involving her cardiac care.  Prior to her visit I spent greater than 20 minutes reviewing her past medical history,  medications, and prior cardiac tests.

## 2022-09-14 ENCOUNTER — Ambulatory Visit: Payer: Medicare Other | Attending: General Practice | Admitting: General Practice

## 2022-09-14 ENCOUNTER — Encounter: Payer: Self-pay | Admitting: General Practice

## 2022-09-14 VITALS — BP 128/82 | HR 75 | Ht 72.0 in | Wt 294.6 lb

## 2022-09-14 DIAGNOSIS — R0609 Other forms of dyspnea: Secondary | ICD-10-CM | POA: Insufficient documentation

## 2022-09-14 DIAGNOSIS — I719 Aortic aneurysm of unspecified site, without rupture: Secondary | ICD-10-CM | POA: Diagnosis not present

## 2022-09-14 DIAGNOSIS — I25118 Atherosclerotic heart disease of native coronary artery with other forms of angina pectoris: Secondary | ICD-10-CM | POA: Diagnosis not present

## 2022-09-14 DIAGNOSIS — R0602 Shortness of breath: Secondary | ICD-10-CM | POA: Diagnosis not present

## 2022-09-14 NOTE — Patient Instructions (Addendum)
Medication Instructions:  Your physician recommends that you continue on your current medications as directed. Please refer to the Current Medication list given to you today.  *If you need a refill on your cardiac medications before your next appointment, please call your pharmacy*   Lab Work: NONE If you have labs (blood work) drawn today and your tests are completely normal, you will receive your results only by: MyChart Message (if you have MyChart) OR A paper copy in the mail If you have any lab test that is abnormal or we need to change your treatment, we will call you to review the results.   Testing/Procedures: NONE   Follow-Up: At [facility], you and your health needs are our priority.  As part of our continuing mission to provide you with exceptional heart care, we have created designated Provider Care Teams.  These Care Teams include your primary Cardiologist (physician) and Advanced Practice Providers (APPs -  Physician Assistants and Nurse Practitioners) who all work together to provide you with the care you need, when you need it.  We recommend signing up for the patient portal called "MyChart".  Sign up information is provided on this After Visit Summary.  MyChart is used to connect with patients for Virtual Visits (Telemedicine).  Patients are able to view lab/test results, encounter notes, upcoming appointments, etc.  Non-urgent messages can be sent to your provider as well.   To learn more about what you can do with MyChart, go to [email or web address].    Your next appointment:   6 month(s)  Provider:   Edd Fabian, FNP OR Kardie Tobb, DO YOU CAN KEEP JULY APPOINTMENT WITH DR. TOBB BUT IF YOU WOULD LIKE TO CANCEL THAT APPOINTMENT PLEASE DO SO 24 HOURS PRIOR TO APPOINTMENT. Other Instructions LIMIT FLUID INTAKE TO 64 OZ OR LESS DAILY. USE RECUMBENT BIKE FOR 10 TO 15 MINUTES PER DAY AND GRADUALLY INCREASE AS TOLERATED.

## 2022-09-18 DIAGNOSIS — H34812 Central retinal vein occlusion, left eye, with macular edema: Secondary | ICD-10-CM | POA: Diagnosis not present

## 2022-09-18 DIAGNOSIS — H401121 Primary open-angle glaucoma, left eye, mild stage: Secondary | ICD-10-CM | POA: Diagnosis not present

## 2022-09-18 DIAGNOSIS — H35042 Retinal micro-aneurysms, unspecified, left eye: Secondary | ICD-10-CM | POA: Diagnosis not present

## 2022-09-19 ENCOUNTER — Ambulatory Visit: Payer: Medicare Other | Admitting: Physician Assistant

## 2022-09-26 ENCOUNTER — Encounter (HOSPITAL_COMMUNITY): Payer: Medicare Other

## 2022-10-03 DIAGNOSIS — G20A1 Parkinson's disease without dyskinesia, without mention of fluctuations: Secondary | ICD-10-CM | POA: Diagnosis not present

## 2022-10-03 DIAGNOSIS — K219 Gastro-esophageal reflux disease without esophagitis: Secondary | ICD-10-CM | POA: Diagnosis not present

## 2022-10-03 DIAGNOSIS — R110 Nausea: Secondary | ICD-10-CM | POA: Diagnosis not present

## 2022-10-03 DIAGNOSIS — Z803 Family history of malignant neoplasm of breast: Secondary | ICD-10-CM | POA: Diagnosis not present

## 2022-10-03 DIAGNOSIS — N644 Mastodynia: Secondary | ICD-10-CM | POA: Diagnosis not present

## 2022-10-03 DIAGNOSIS — G8929 Other chronic pain: Secondary | ICD-10-CM | POA: Diagnosis not present

## 2022-10-03 DIAGNOSIS — M542 Cervicalgia: Secondary | ICD-10-CM | POA: Diagnosis not present

## 2022-10-03 DIAGNOSIS — R42 Dizziness and giddiness: Secondary | ICD-10-CM | POA: Diagnosis not present

## 2022-10-03 DIAGNOSIS — G250 Essential tremor: Secondary | ICD-10-CM | POA: Diagnosis not present

## 2022-10-05 DIAGNOSIS — I83819 Varicose veins of unspecified lower extremities with pain: Secondary | ICD-10-CM | POA: Diagnosis not present

## 2022-10-05 DIAGNOSIS — I83899 Varicose veins of unspecified lower extremities with other complications: Secondary | ICD-10-CM | POA: Diagnosis not present

## 2022-10-08 ENCOUNTER — Other Ambulatory Visit: Payer: Self-pay | Admitting: Family Medicine

## 2022-10-08 DIAGNOSIS — I83899 Varicose veins of unspecified lower extremities with other complications: Secondary | ICD-10-CM | POA: Diagnosis not present

## 2022-10-08 DIAGNOSIS — N644 Mastodynia: Secondary | ICD-10-CM

## 2022-10-17 ENCOUNTER — Ambulatory Visit: Payer: Medicare Other

## 2022-10-17 ENCOUNTER — Ambulatory Visit
Admission: RE | Admit: 2022-10-17 | Discharge: 2022-10-17 | Disposition: A | Discharge status: Home / Self Care | Payer: Medicare Other | Source: Ambulatory Visit | Attending: Family Medicine | Admitting: Family Medicine

## 2022-10-17 DIAGNOSIS — N644 Mastodynia: Secondary | ICD-10-CM | POA: Diagnosis not present

## 2022-10-17 DIAGNOSIS — Z803 Family history of malignant neoplasm of breast: Secondary | ICD-10-CM | POA: Diagnosis not present

## 2022-10-25 ENCOUNTER — Encounter: Payer: Self-pay | Admitting: Podiatry

## 2022-10-25 ENCOUNTER — Ambulatory Visit (INDEPENDENT_AMBULATORY_CARE_PROVIDER_SITE_OTHER): Payer: Medicare Other | Admitting: Podiatry

## 2022-10-25 DIAGNOSIS — M79675 Pain in left toe(s): Secondary | ICD-10-CM | POA: Diagnosis not present

## 2022-10-25 DIAGNOSIS — I739 Peripheral vascular disease, unspecified: Secondary | ICD-10-CM

## 2022-10-25 DIAGNOSIS — M79674 Pain in right toe(s): Secondary | ICD-10-CM | POA: Diagnosis not present

## 2022-10-25 DIAGNOSIS — G609 Hereditary and idiopathic neuropathy, unspecified: Secondary | ICD-10-CM

## 2022-10-25 DIAGNOSIS — B351 Tinea unguium: Secondary | ICD-10-CM

## 2022-10-25 NOTE — Progress Notes (Signed)
  Subjective:  Patient ID: Todd Chan, male    DOB: 02/05/1946,   MRN: [id number]  Chief Complaint  Patient presents with   Nail Problem    Pt came in  today for a routine foot care. Pt denies pain.    77 y.o. male presents for concern of painful thickened and elongated toenails with a history of PVD and neuropathy. Denies diabetes. Hoping to have nails trimmed as well.  . Denies any other pedal complaints. Denies n/v/f/c.   PCP: Myles Lipps DO   Past Medical History:  Diagnosis Date   Arthritis    GERD (gastroesophageal reflux disease)    Glaucoma    Hypertension    Osteoarthritis of knee    Painful urination    Peripheral vascular disease (HCC)    bilateral edema     Objective:  Physical Exam: Vascular: DP/PT pulses 1/4 bilateral. CFT <3 seconds. Normal hair growth on digits. No edema.  Skin. No lacerations or abrasions bilateral feet. Nails 1-5 are thickened discolored and elongated with subungual debris.  Musculoskeletal: MMT 5/5 bilateral lower extremities in DF, PF, Inversion and Eversion. Deceased ROM in DF of ankle joint. No pain to palpation.  Neurological: Sensation diminished to light touch. Protective sensation present to all 10 locations with SWMF.   Assessment:   1. Pain due to onychomycosis of toenails of both feet   2. Idiopathic peripheral neuropathy   3. Peripheral vascular disease (HCC)         Plan:  Patient was evaluated and treated and all questions answered. Discussed neuropathy and etiology as well as treatment with patient.  -Discussed and educated patient on foot care, especially with  regards to the vascular, neurological and musculoskeletal systems.  -Discussed supportive shoes at all times and checking feet regularly.  -Nails 1-5 b/l debrided today without incident.  -Patient to return in 3 months for at risk foot care.    Louann Sjogren, DPM

## 2022-10-31 ENCOUNTER — Ambulatory Visit: Payer: Medicare Other | Attending: Cardiology | Admitting: Cardiology

## 2022-10-31 ENCOUNTER — Ambulatory Visit: Payer: Medicare Other | Admitting: Cardiology

## 2022-10-31 ENCOUNTER — Encounter: Payer: Self-pay | Admitting: Cardiology

## 2022-10-31 VITALS — BP 144/88 | HR 88 | Ht 72.0 in | Wt 289.0 lb

## 2022-10-31 DIAGNOSIS — I719 Aortic aneurysm of unspecified site, without rupture: Secondary | ICD-10-CM | POA: Insufficient documentation

## 2022-10-31 DIAGNOSIS — I739 Peripheral vascular disease, unspecified: Secondary | ICD-10-CM | POA: Insufficient documentation

## 2022-10-31 DIAGNOSIS — I2510 Atherosclerotic heart disease of native coronary artery without angina pectoris: Secondary | ICD-10-CM | POA: Diagnosis not present

## 2022-10-31 DIAGNOSIS — Z79899 Other long term (current) drug therapy: Secondary | ICD-10-CM | POA: Insufficient documentation

## 2022-10-31 DIAGNOSIS — I7121 Aneurysm of the ascending aorta, without rupture: Secondary | ICD-10-CM | POA: Diagnosis not present

## 2022-10-31 MED ORDER — CARVEDILOL 6.25 MG PO TABS: 6.2500 mg | ORAL_TABLET | Freq: Two times a day (BID) | ORAL | 3 refills | Status: DC

## 2022-10-31 NOTE — Patient Instructions (Addendum)
Medication Instructions:  Your physician has recommended you make the following change in your medication:  STOP: Lopressor START: Coreg 6.25 mg twice daily *If you need a refill on your cardiac medications before your next appointment, please call your pharmacy*   Lab Work: Your physician recommends that you have labs drawn today: CMET, Mag, TSH If you have labs (blood work) drawn today and your tests are completely normal, you will receive your results only by: MyChart Message (if you have MyChart) OR A paper copy in the mail If you have any lab test that is abnormal or we need to change your treatment, we will call you to review the results.   Testing/Procedures: Non-Cardiac CT Angiography (CTA), (November 2025) is a special type of CT scan that uses a computer to produce multi-dimensional views of major blood vessels throughout the body. In CT angiography, a contrast material is injected through an IV to help visualize the blood vessels   Follow-Up: At [facility], you and your health needs are our priority.  As part of our continuing mission to provide you with exceptional heart care, we have created designated Provider Care Teams.  These Care Teams include your primary Cardiologist (physician) and Advanced Practice Providers (APPs -  Physician Assistants and Nurse Practitioners) who all work together to provide you with the care you need, when you need it.  Your next appointment:   December 16th  Provider:   Thomasene Ripple, DO

## 2022-10-31 NOTE — Progress Notes (Signed)
Cardiology Office Note:    Date:  11/07/2022   ID:  Todd Chan, DOB 08/15/1945, MRN [id number]  PCP:  Koren Shiver, DO  Cardiologist:  Thomasene Ripple, DO  Electrophysiologist:  None   Referring MD: Koren Shiver, DO   'I am ok"  History of Present Illness:    Todd Chan is a 77 y.o. male with a hx of mild CAD with 50% proximal LAD stenosis and normal left ventricular function at coronary angiography in 2019 hypertension mild enlargement ascending aorta stable at 41 mm 06/28/2020 last seen 07/11/2020.   His initial visit with me was the transition of care from Dr. Dulce Sellar. He was doing well from a CV stanpoint. I send the patient for a survillance Chest CTA for his aortic aneursym.   Since I saw the patient he has followed with Edd Fabian NP and seen on September 14, 2022 at that time he was complaining of shortness of breath.  During office visit echocardiogram was reviewed which showed mild mitral regurgitation and no significant change from prior.  In today's visit he tells me his shortness of breath has improved.  Leg swelling also has improved.   Past Medical History:  Diagnosis Date   Arthritis    GERD (gastroesophageal reflux disease)    Glaucoma    Hypertension    Osteoarthritis of knee    Painful urination    Peripheral vascular disease (HCC)    bilateral edema     Past Surgical History:  Procedure Laterality Date   CARDIAC CATHETERIZATION  04/2017   CATARACT EXTRACTION, BILATERAL Bilateral    Left: 10/30/21, right: 11/20/21.   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   TONSILLECTOMY     TOTAL KNEE ARTHROPLASTY Left 10/08/2016   Procedure: LEFT TOTAL KNEE ARTHROPLASTY;  Surgeon: Ollen Gross, MD;  Location: [facility];  Service: Orthopedics;  Laterality: Left;   TOTAL KNEE ARTHROPLASTY Right 06/17/2017   Procedure: RIGHT TOTAL KNEE ARTHROPLASTY;  Surgeon: Ollen Gross, MD;  Location: [facility];  Service: Orthopedics;  Laterality: Right;    Current Medications: Current Meds  Medication Sig   aspirin 81 MG EC tablet 1 tablet   carbidopa-levodopa (SINEMET IR) 25-100 MG tablet TAKE 1 TABLET 4 TIMES DAILY   carvedilol (COREG) 6.25 MG tablet Take 1 tablet (6.25 mg total) by mouth 2 (two) times daily.   Cholecalciferol (VITAMIN D3) 50 MCG (2000 UT) capsule Take 2,000 Units by mouth daily.   Coenzyme Q10 (CO Q 10) 100 MG CAPS See admin instructions.   latanoprost (XALATAN) 0.005 % ophthalmic solution Place 1 drop into the left eye at bedtime.   Multiple Vitamin (MULTIVITAMIN) capsule Take 1 capsule by mouth daily.   Omega-3 Fatty Acids (FISH OIL PO) Take 1,290 mg by mouth daily.   pantoprazole (PROTONIX) 40 MG tablet 1 tablet   pravastatin (PRAVACHOL) 20 MG tablet Take 1 tablet (20 mg  total) by mouth daily.   telmisartan-hydrochlorothiazide (MICARDIS HCT) 40-12.5 MG tablet Take 1 tablet by mouth daily.   [DISCONTINUED] metoprolol tartrate (LOPRESSOR) 25 MG tablet Take 1 tablet (25 mg total) by mouth 2 (two) times daily.     Allergies:   Patient has no known allergies.   Social History   Socioeconomic History   Marital status: Married    Spouse name: Not on file   Number of children: 2   Years of education: Not on file   Highest education level: Bachelor's degree (e.g., BA, AB, BS)  Occupational History   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[employer]  Tobacco Use   Smoking status: Former    Current packs/day: 0.00    Average packs/day: 1 pack/day for 20.0 years (20.0 ttl pk-yrs)    Types: Cigarettes    Start date: 10/1967    Quit date: 10/1987    Years since quitting: 35.1   Smokeless tobacco: Never   Tobacco comments:    Quit 34 years ago  Vaping Use   Vaping status: Never Used  Substance and Sexual Activity   Alcohol use: Yes    Alcohol/week: 7.0 standard drinks of alcohol    Types: 7 Glasses of wine per week    Comment: daily, 2-3 glasses scotch per day   Drug use: No   Sexual activity: Yes  Other Topics Concern   Not on file  Social History Narrative   Lives at home with his wife   Left handed   Drinks coffee (2-3 cups per day)   Retired   International aid/development worker of [employer] Strain: Low Risk  (09/07/2022)   Received from [facility], [facility]   Overall Financial Resource Strain (CARDIA)    Difficulty of Paying Living Expenses: Not hard at all  Food Insecurity: No Food Insecurity (09/07/2022)   Received from [facility], [facility]   Hunger Vital Sign    Worried About Running Out of Food in the Last Year: Never true    Ran Out of Food in the Last Year: Never true  Transportation Needs: No Transportation Needs (09/07/2022)   Received from [facility], [facility]   PRAPARE - Transportation    Lack of Transportation (Medical): No    Lack of Transportation (Non-Medical): No  Physical Activity: Sufficiently Active (09/07/2022)   Received from [facility], [facility]   Exercise Vital Sign    Days of Exercise per Week: 3 days    Minutes of Exercise per Session: 60 min  Stress: No Stress Concern Present (09/07/2022)   Received from [facility], [facility] of Occupational Health - Occupational Stress Questionnaire    Feeling of Stress : Not at all  Social Connections: Somewhat Isolated (09/07/2022)   Received from [facility], [facility]   Social  Network    How would you rate your social network (family, work, friends)?: Restricted participation with some degree of social isolation     Family History: The patient's family history includes Breast cancer (age of onset: 93) in his sister; Breast cancer (age of onset: 19) in his mother; Healthy in his daughter; Parkinson's disease in his sister; Prostate cancer in his father. There is no history of Neuropathy.  ROS:   Review of Systems  Constitution: Negative for decreased appetite, fever and weight gain.  HENT: Negative for congestion, ear discharge, hoarse voice and sore throat.   Eyes: Negative for discharge,  redness, vision loss in right eye and visual halos.  Cardiovascular: Negative for chest pain, dyspnea on exertion, leg swelling, orthopnea and palpitations.  Respiratory: Negative for cough, hemoptysis, shortness of breath and snoring.   Endocrine: Negative for heat intolerance and polyphagia.  Hematologic/Lymphatic: Negative for bleeding problem. Does not bruise/bleed easily.  Skin: Negative for flushing, nail changes, rash and suspicious lesions.  Musculoskeletal: Negative for arthritis, joint pain, muscle cramps, myalgias, neck pain and stiffness.  Gastrointestinal: Negative for abdominal pain, bowel incontinence, diarrhea and excessive appetite.  Genitourinary: Negative for decreased libido, genital sores and incomplete emptying.  Neurological: Negative for brief paralysis, focal weakness, headaches and loss of balance.  Psychiatric/Behavioral: Negative for altered mental status, depression and suicidal ideas.  Allergic/Immunologic: Negative for HIV exposure and persistent infections.    EKGs/Labs/Other Studies Reviewed:    The following studies were reviewed today:   EKG:  The ekg ordered today demonstrates sinus rhythm  CT chest 05/2020 IMPRESSION: 1. Stable appearance of ascending thoracic aorta with mild aneurysmal dilation. Recommend annual imaging followup by  CTA or MRA. This recommendation follows 2010 ACCF/AHA/AATS/ACR/ASA/SCA/SCAI/SIR/STS/SVM Guidelines for the Diagnosis and Management of Patients with Thoracic Aortic Disease. Circulation. 2010; 121: Z610-R604. Aortic aneurysm NOS (ICD10-I71.9) 2. Signs of coronary artery disease as before. 3. Cholelithiasis. 4. Lobular hepatic contours, correlate with any clinical or laboratory evidence of liver disease. 5. Aortic atherosclerosis.   Aortic Atherosclerosis (ICD10-I70.0).     Electronically Signed   By: Donzetta Kohut M.D.   On: 06/28/2020 15:32  LHC 04/2017 Ost LAD to Prox LAD lesion is 15% stenosed. Prox LAD lesion is 10% stenosed. The left ventricular systolic function is normal. LV end diastolic pressure is normal. The left ventricular ejection fraction is 55-65% by visual estimate.   Normal LV function without focal wall motion abnormalities.  EF 55-60%.   No significant coronary obstructive disease, although there is a small area of calcification superior to the ostium of the a short left main, 10-15% luminal irregularity of the proximal LAD and a normal ramus intermediate, left circumflex, and dominant RCA.   RECOMMENDATION: Medical therapy.  Suspect attenuation artifact contributing to the patient's abnormal nuclear stress test.  The patient will return to Dr. Norman Herrlich and will be given preoperative cardiac clearance.    Recent Labs: 10/31/2022: ALT 10; BUN 15; Creatinine, Ser 0.95; Potassium 4.5; Sodium 140; TSH 1.190  Recent Lipid Panel    Component Value Date/Time   CHOL 143 07/11/2020 0929   TRIG 73 07/11/2020 0929   HDL 51 07/11/2020 0929   CHOLHDL 2.8 07/11/2020 0929   LDLCALC 78 07/11/2020 0929    Physical Exam:    VS:  BP (!) 144/88 (BP Location: Right Arm, Patient Position: Sitting, Cuff Size: Normal)   Pulse 88   Ht 6' (1.829 m)   Wt 289 lb (131.1 kg)   SpO2 95%   BMI 39.20 kg/m     Wt Readings from Last 3 Encounters:  10/31/22 289 lb (131.1  kg)  09/14/22 294 lb 9.6 oz (133.6 kg)  06/07/22 (!) 300 lb 9.6 oz (136.4 kg)     GEN: Well nourished, well developed in no acute distress HEENT: Normal NECK: No JVD; No carotid bruits LYMPHATICS: No lymphadenopathy CARDIAC: S1S2 noted,RRR, no murmurs, rubs, gallops RESPIRATORY:  Clear to auscultation without rales, wheezing or rhonchi  ABDOMEN: Soft, non-tender, non-distended, +bowel sounds, no guarding. EXTREMITIES: No edema, No cyanosis, no clubbing MUSCULOSKELETAL:  No deformity  SKIN: Warm and dry NEUROLOGIC:  Alert  and oriented x 3, non-focal PSYCHIATRIC:  Normal affect, good insight  ASSESSMENT:    1. Aortic aneurysm without rupture, unspecified portion of aorta (HCC)   2. Medication management   3. Mild CAD   4. Aneurysm of ascending aorta without rupture (HCC)   5. Peripheral vascular disease (HCC)    PLAN:    He appears to be doing well from a CV standpoint. He is due for his follow up Chest CTA so I will order this today.   Will get blood work for his kidney function as well as TSH.  CAD- no anginal Blood pressure at target Continue current statin dose Will send refills.  The patient is in agreement with the above plan. The patient left the office in stable condition.  The patient will follow up in   Medication Adjustments/Labs and Tests Ordered: Current medicines are reviewed at length with the patient today.  Concerns regarding medicines are outlined above.  Orders Placed This Encounter  Procedures   CT ANGIO CHEST AORTA W/CM & OR WO/CM   Magnesium   Comprehensive Metabolic Panel (CMET)   TSH+T4F+T3Free   Meds ordered this encounter  Medications   carvedilol (COREG) 6.25 MG tablet    Sig: Take 1 tablet (6.25 mg total) by mouth 2 (two) times daily.    Dispense:  180 tablet    Refill:  3    Patient Instructions  Medication Instructions:  Your physician has recommended you make the following change in your medication:  STOP: Lopressor START:  Coreg 6.25 mg twice daily *If you need a refill on your cardiac medications before your next appointment, please call your pharmacy*   Lab Work: Your physician recommends that you have labs drawn today: CMET, Mag, TSH If you have labs (blood work) drawn today and your tests are completely normal, you will receive your results only by: MyChart Message (if you have MyChart) OR A paper copy in the mail If you have any lab test that is abnormal or we need to change your treatment, we will call you to review the results.   Testing/Procedures: Non-Cardiac CT Angiography (CTA), (November 2025) is a special type of CT scan that uses a computer to produce multi-dimensional views of major blood vessels throughout the body. In CT angiography, a contrast material is injected through an IV to help visualize the blood vessels   Follow-Up: At [facility], you and your health needs are our priority.  As part of our continuing mission to provide you with exceptional heart care, we have created designated Provider Care Teams.  These Care Teams include your primary Cardiologist (physician) and Advanced Practice Providers (APPs -  Physician Assistants and Nurse Practitioners) who all work together to provide you with the care you need, when you need it.  Your next appointment:   December 16th  Provider:   Thomasene Ripple, DO      Adopting a Healthy Lifestyle.  Know what a healthy weight is for you (roughly BMI <25) and aim to maintain this   Aim for 7+ servings of fruits and vegetables daily   65-80+ fluid ounces of water or unsweet tea for healthy kidneys   Limit to max 1 drink of alcohol per day; avoid smoking/tobacco   Limit animal fats in diet for cholesterol and heart health - choose grass fed whenever available   Avoid highly processed foods, and foods high in saturated/trans fats   Aim for low stress - take time to unwind and care  for your mental health   Aim for 150 min of  moderate intensity exercise weekly for heart health, and weights twice weekly for bone health   Aim for 7-9 hours of sleep daily   When it comes to diets, agreement about the perfect plan isnt easy to find, even among the experts. Experts at the [facility] [employer] developed an idea known as the Healthy Eating Plate. Just imagine a plate divided into logical, healthy portions.   The emphasis is on diet quality:   Load up on vegetables and fruits - one-half of your plate: Aim for color and variety, and remember that potatoes dont count.   Go for whole grains - one-quarter of your plate: Whole wheat, barley, wheat berries, quinoa, oats, brown rice, and foods made with them. If you want pasta, go with whole wheat pasta.   Protein power - one-quarter of your plate: Fish, chicken, beans, and nuts are all healthy, versatile protein sources. Limit red meat.   The diet, however, does go beyond the plate, offering a few other suggestions.   Use healthy plant oils, such as olive, canola, soy, corn, sunflower and peanut. Check the labels, and avoid partially hydrogenated oil, which have unhealthy trans fats.   If youre thirsty, drink water. Coffee and tea are good in moderation, but skip sugary drinks and limit milk and dairy products to one or two daily servings.   The type of carbohydrate in the diet is more important than the amount. Some sources of carbohydrates, such as vegetables, fruits, whole grains, and beans-are healthier than others.   Finally, stay active  Signed, Thomasene Ripple, DO  11/07/2022 8:47 AM    [facility]

## 2022-11-15 ENCOUNTER — Other Ambulatory Visit: Payer: Self-pay | Admitting: Neurology

## 2022-11-19 ENCOUNTER — Encounter: Payer: Self-pay | Admitting: Cardiology

## 2022-11-28 DIAGNOSIS — R051 Acute cough: Secondary | ICD-10-CM | POA: Diagnosis not present

## 2022-11-28 DIAGNOSIS — Z23 Encounter for immunization: Secondary | ICD-10-CM | POA: Diagnosis not present

## 2022-11-28 DIAGNOSIS — J40 Bronchitis, not specified as acute or chronic: Secondary | ICD-10-CM | POA: Diagnosis not present

## 2022-12-04 ENCOUNTER — Ambulatory Visit (INDEPENDENT_AMBULATORY_CARE_PROVIDER_SITE_OTHER): Payer: Medicare Other | Admitting: Neurology

## 2022-12-04 ENCOUNTER — Encounter: Payer: Self-pay | Admitting: Neurology

## 2022-12-04 VITALS — BP 146/85 | HR 64 | Ht 72.0 in | Wt 290.0 lb

## 2022-12-04 DIAGNOSIS — G20A1 Parkinson's disease without dyskinesia, without mention of fluctuations: Secondary | ICD-10-CM | POA: Diagnosis not present

## 2022-12-04 DIAGNOSIS — R251 Tremor, unspecified: Secondary | ICD-10-CM

## 2022-12-04 DIAGNOSIS — R42 Dizziness and giddiness: Secondary | ICD-10-CM

## 2022-12-04 DIAGNOSIS — G629 Polyneuropathy, unspecified: Secondary | ICD-10-CM

## 2022-12-04 MED ORDER — PRIMIDONE 50 MG PO TABS: ORAL_TABLET | ORAL | 5 refills | Status: DC

## 2022-12-04 NOTE — Addendum Note (Signed)
Addended by: Despina Arias A on: 12/04/2022 02:12 PM   Modules accepted: Level of Service

## 2022-12-04 NOTE — Progress Notes (Addendum)
[facility]  PATIENT: Todd Chan DOB: 1945-05-12  REFERRING DOCTOR OR PCP:  Myles Lipps, DO SOURCE: patient, notes from Dr. Lucia Gaskins and PCP  _________________________________   HISTORICAL  CHIEF COMPLAINT:  Chief Complaint  Patient presents with   Follow-up    Pt in room 11, wife in room. Here for parkinson follow up. Tremors mainly in left arm, pt is seeing chiropractor couple of weeks now helps with neck.  Pt gets anxious and is asking if anti anxiety medication.      HISTORY OF PRESENT ILLNESS:  Todd Chan is a 77 y.o. man with spells of lightheadedness, nausea, tremors and neuropathy.    Update 06/07/2022: He continues to have spells of lightheadedness - usually right after standing up, espeically if sitting longer time.  These spells last 30-60 seconds.    We had him try mestinon but it did not help so he stopped.      He has HTN but no known cardiac issues.      He has a tremor in hs hands.    He does not think he got much benefit from the Sinemet as far as the tremor.   Current dose is 25/100 qid.    He has seen Dr. Arbutus Leas in the past.   THe tremor is left > right and is present at rest but appears higher frequency with intention.    He is left handed    Tremor s worse with nervous.   Unclear if element of essential tremor Primidone was tried but he had headaches and lightheaded and stopped.  It did not seem to help noticeably.    Ativan had npt helped.  He is also on metoprolol 25 mg po bid (sees cardiology for an aortic tear) but does not feel it has helped any,      The tremor gets better after 2 drinks (1.5 ounce shots and [place] over ice).    More recently he has had tremor in the legs    He is claustophobic and prefers not to do a DaTScan.    He has lightheadedness upon standing  - this is worse if sitting a while.   He also feels more lightheaded after he bends over (to tie shoes).   This usually occurs for a minute or 2 and may also occur  if he is standing a while.  He feels pressure in his ears while this is occurring.    He tried compression stockings but has not given it a good try.   He will try again next month when it cools down.  He snores but no witnessed apnea.  He had some snorts/gasps but none recently.  He has nocturia which wakes I'm up some  Occasional  HA pain is left > right most of the time but is greater on the right today..   Neck is sometimes stiff and there is upper neck pain/occipital pain.   Most of the time the headaches only last for an hour or so.   When he had nausea. Ondansetron had not helped.   Tylenol has not helped much.     CT head was normal for age.   CT cervical showed some DJD with moderate foraminal narrowing at C3C4 but no severe changes.    MRI of the brain 5/312023 was normal for age - mild atrophy and small vessel changes.  Nothing acute.    MRI of the cervical spine 08/30/2021 showed a normal spinal cord.  At  C3C4 he has mild spinal stenosis due to disc bulging and spondylosis.  Also foraminal narrowing.  A few small bulges elsewhere without sinal stenosis.    Bilateral carotid ultrasound showed less than 50% stenosis in the carotid arteries around the bulbs bilaterally.  The vertebral artery system had antegrade flow bilaterally.  Of note, BP was 188/97 at the time.  An x-ray of the cervical spine showed mild retrolisthesis of C3 upon C4 due to spondylosis.  There is foraminal narrowing at this level bilaterally.  Lesser foraminal narrowing is noted at C6-C7 due to degenerative changes.  His sister has PD.     TREMOR AND NEUROPATHY HISTORY: He has had progressively worsening tremor since 2016 on his left side only.  Initially, only the hand was involved and he noted worsening handwriting.   He noted the tremor in his leg a year or two later.    Tremor is worse if with intention and is a little worse if more tense.   He has noted some improvement with relaxing the left shoulder/back. He has  been on metoprolol for a few years and did not note any improvement when he started.   Scotch helps some.       He has had numbness in his feet for several years and saw Dr. Lucia Gaskins once in 2019.  He has right > left leg edema.    He feels the numbness and foot/ankle/lower leg swelling have both worsened.   In 2019, SPEP/IEF, SSA/SSB, RF, thiamine, ESR were fine.   He is on metoprolol 25 mg po bid for his heart.   Primidone was tried June 2021.    REVIEW OF SYSTEMS: Constitutional: No fevers, chills, sweats, or change in appetite Eyes: No visual changes, double vision, eye pain Ear, nose and throat: No hearing loss, ear pain, nasal congestion, sore throat Cardiovascular: No chest pain, palpitations Respiratory:  No shortness of breath at rest or with exertion.   No wheezes GastrointestinaI: No nausea, vomiting, diarrhea, abdominal pain, fecal incontinence Genitourinary:  No dysuria, urinary retention or frequency.  No nocturia. Musculoskeletal:  No neck pain, back pain Integumentary: No rash, pruritus, skin lesions Neurological: as above Psychiatric: No depression at this time.  No anxiety Endocrine: No palpitations, diaphoresis, change in appetite, change in weigh or increased thirst Hematologic/Lymphatic:  No anemia, purpura, petechiae. Allergic/Immunologic: No itchy/runny eyes, nasal congestion, recent allergic reactions, rashes  ALLERGIES: No Known Allergies  HOME MEDICATIONS:  Current Outpatient Medications:    aspirin 81 MG EC tablet, 1 tablet, Disp: , Rfl:    carbidopa-levodopa (SINEMET IR) 25-100 MG tablet, TAKE 1 TABLET 4 TIMES DAILY, Disp: 360 tablet, Rfl: 0   carvedilol (COREG) 6.25 MG tablet, Take 1 tablet (6.25 mg total) by mouth 2 (two) times daily., Disp: 180 tablet, Rfl: 3   Cholecalciferol (VITAMIN D3) 50 MCG (2000 UT) capsule, Take 2,000 Units by mouth daily., Disp: , Rfl:    Coenzyme Q10 (CO Q 10) 100 MG CAPS, See admin instructions., Disp: , Rfl:    latanoprost  (XALATAN) 0.005 % ophthalmic solution, Place 1 drop into the left eye at bedtime., Disp: , Rfl:    Multiple Vitamin (MULTIVITAMIN) capsule, Take 1 capsule by mouth daily., Disp: , Rfl:    Omega-3 Fatty Acids (FISH OIL PO), Take 1,290 mg by mouth daily., Disp: , Rfl:    pantoprazole (PROTONIX) 40 MG tablet, 1 tablet, Disp: , Rfl:    pravastatin (PRAVACHOL) 20 MG tablet, Take 1 tablet (20 mg total) by  mouth daily., Disp: 90 tablet, Rfl: 3   primidone (MYSOLINE) 50 MG tablet, One po tid, Disp: 90 tablet, Rfl: 5   telmisartan-hydrochlorothiazide (MICARDIS HCT) 40-12.5 MG tablet, Take 1 tablet by mouth daily., Disp: 90 tablet, Rfl: 3   amantadine (SYMMETREL) 100 MG capsule, Take 1 capsule (100 mg total) by mouth 2 (two) times daily. (Patient not taking: Reported on 06/19/2022), Disp: 60 capsule, Rfl: 5  PAST MEDICAL HISTORY: Past Medical History:  Diagnosis Date   Arthritis    GERD (gastroesophageal reflux disease)    Glaucoma    Hypertension    Osteoarthritis of knee    Painful urination    Peripheral vascular disease (HCC)    bilateral edema     PAST SURGICAL HISTORY: Past Surgical History:  Procedure Laterality Date   CARDIAC CATHETERIZATION  04/2017   CATARACT EXTRACTION, BILATERAL Bilateral    Left: 10/30/21, right: 11/20/21.   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   TONSILLECTOMY     TOTAL KNEE  ARTHROPLASTY Left 10/08/2016   Procedure: LEFT TOTAL KNEE ARTHROPLASTY;  Surgeon: Ollen Gross, MD;  Location: [facility];  Service: Orthopedics;  Laterality: Left;   TOTAL KNEE ARTHROPLASTY Right 06/17/2017   Procedure: RIGHT TOTAL KNEE ARTHROPLASTY;  Surgeon: Ollen Gross, MD;  Location: [facility];  Service: Orthopedics;  Laterality: Right;    FAMILY HISTORY: Family History  Problem Relation Age of Onset   Breast cancer Mother 64   Prostate cancer Father    Breast cancer Sister 12   Parkinson's disease Sister    Healthy Daughter    Neuropathy Neg Hx     SOCIAL HISTORY:  Social History   Socioeconomic History   Marital status: Married    Spouse name: Not on file   Number of children: 2   Years of education: Not on file   Highest education level: Bachelor's degree (e.g., BA, AB, BS)  Occupational History   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[employer]  Tobacco Use   Smoking status: Former    Current packs/day: 0.00    Average packs/day: 1 pack/day for 20.0 years (20.0 ttl pk-yrs)    Types: Cigarettes    Start date: 10/1967    Quit date: 10/1987    Years since quitting: 35.2   Smokeless tobacco: Never   Tobacco comments:    Quit 34 years ago  Vaping Use   Vaping status: Never Used  Substance and Sexual Activity   Alcohol use: Yes    Alcohol/week: 7.0 standard drinks of alcohol    Types: 7 Glasses of wine per week    Comment: daily, 2-3 glasses scotch per day   Drug use: No   Sexual activity: Yes  Other Topics Concern   Not on file  Social History Narrative   Lives at home with his wife   Left handed   Drinks coffee (2-3 cups per day)  Retired   Chemical engineer Strain: Low Risk  (09/07/2022)   Received from [facility], [facility]   Overall Financial Resource Strain (CARDIA)    Difficulty of Paying Living Expenses: Not hard at all  Food Insecurity: No Food Insecurity (09/07/2022)   Received from [facility], [facility]   Hunger Vital Sign    Worried About Running Out of Food in the Last Year: Never true    Ran Out of Food in the Last Year: Never true  Transportation Needs: No Transportation Needs (09/07/2022)   Received from [place], [facility]   PRAPARE - Transportation    Lack of Transportation (Medical): No    Lack of Transportation (Non-Medical): No  Physical Activity: Sufficiently Active (09/07/2022)   Received from [facility], [facility]   Exercise Vital Sign    Days of Exercise per Week: 3 days    Minutes of Exercise per Session: 60 min  Stress: No Stress Concern Present (09/07/2022)   Received from [facility], [facility] - Occupational Stress Questionnaire    Feeling of Stress : Not at all  Social Connections: Somewhat Isolated (09/07/2022)   Received from [facility], [facility]   Social Network    How would you rate your social network (family, work, friends)?: Restricted participation with some degree of social isolation  Intimate Partner Violence: Not At Risk (09/07/2022)   Received from [facility], [facility]   HITS    Over the last 12 months how often did your partner physically hurt you?: 1    Over the last 12 months how often did your partner insult you or talk down to you?: 1    Over the last 12 months how often did your partner threaten you with physical harm?: 1    Over the last 12 months how often did your partner scream or curse at you?: 1     PHYSICAL EXAM  Vitals:   12/04/22 0958  BP: (!) 146/85  Pulse: 64  Weight: 290 lb (131.5 kg)  Height: 6' (1.829 m)    Body mass index is 39.33 kg/m.  BP recheck 142/90    General: The patient is well-developed and well-nourished and in no acute distress  HEENT:  Head is [place]/AT.  Sclera are anicteric.  Neck has good range of motion.  Skin: Extremities are without rash or  edema.  Neurologic Exam  Mental status:  The patient is  alert and oriented x 3 at the time of the examination. The patient has apparent normal recent and remote memory, with an apparently normal attention span and concentration ability.   Speech is normal.  Cranial nerves: Extraocular movements are full.  Facial strength and sensation was normal.   No dysarthria is noted. No obvious hearing deficits are noted.  Motor: He does not have bradykinesia.  He has a rapid tremor in the left hand that is worse with intention but present at rest.  There is mild tremor in the right arm.     Muscle bulk is normal.   Tone is very slightly increased in the left arm.. Strength is  5 / 5 in all 4 extremities except 4+/5 EHL strength in the feet.   Sensory: Sensory testing is intact to pinprick, soft touch and vibration sensation in the arms but reduced pinprick and vibration in the foot.  Vibration sense fairy normal at ankle (10% great toe and 50%  distal foot).  Coordination: Cerebellar testing reveals good finger-nose-finger and heel-to-shin bilaterally.  Gait and station: Station is normal.  Gait is normal for age. Good turn.   Mild reduced tandem.  He has no retropulsion Romberg is negative.   Reflexes: Deep tendon reflexes are symmetric and normal bilaterally.     DIAGNOSTIC DATA (LABS, IMAGING, TESTING) - I reviewed patient records, labs, notes, testing and imaging myself where available.  Lab Results  Component Value Date   WBC 8.4 05/15/2021   HGB 15.2 05/15/2021   HCT 43.4 05/15/2021   MCV 98.0 05/15/2021   PLT 179 05/15/2021      Component Value Date/Time   NA 140 10/31/2022 0953   K 4.5 10/31/2022 0953   CL 100 10/31/2022 0953   CO2 29 10/31/2022 0953   GLUCOSE 113 (H) 10/31/2022 0953   GLUCOSE 113 (H) 05/15/2021 1625   BUN 15 10/31/2022 0953   CREATININE 0.95 10/31/2022 0953   CALCIUM 9.9 10/31/2022 0953   PROT 6.8 10/31/2022 0953   ALBUMIN 4.3 10/31/2022 0953   AST 20 10/31/2022 0953   ALT 10 10/31/2022 0953   ALKPHOS 38 (L)  10/31/2022 0953   BILITOT 1.5 (H) 10/31/2022 0953   GFRNONAA >60 05/15/2021 1625   GFRAA 92 05/28/2019 1113   Lab Results  Component Value Date   CHOL 143 07/11/2020   HDL 51 07/11/2020   LDLCALC 78 07/11/2020   TRIG 73 07/11/2020   CHOLHDL 2.8 07/11/2020        ASSESSMENT AND PLAN  Parkinson's disease without dyskinesia or fluctuating manifestations  Tremor  Episodic lightheadedness  Polyneuropathy  1.  He will retry compression stockings for the episodes of lightheadedness.  If lightheadedness spells worsen consider fludrocortisone. 2.   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  If no benefit after 2 weeks will stop.    I recommend we obtain the DAT scan but he is claustophobic.  I do not think the a-synuclein seed amplification is commercially available yet.      He will continue 4 Sinemet pills a day.    Amantadine had not helped.   3.    Neuropathy has numbness but no pain.  Evaluation for B12 and MGUS was negative.   4.    He will return in 6 months but call sooner if he has new or worsening symptoms.  This visit is part of a comprehensive longitudinal care medical relationship regarding the patients primary diagnosis of Parkinson's and related concerns.  Richardson Dubree A. Epimenio Foot, MD, [facility] 12/04/2022, 10:28 AM Certified in Neurology, Clinical Neurophysiology, Sleep Medicine and Neuroimaging  [facility] [address], [address] [place], [place] [address] [phone]

## 2022-12-11 DIAGNOSIS — H401121 Primary open-angle glaucoma, left eye, mild stage: Secondary | ICD-10-CM | POA: Diagnosis not present

## 2022-12-11 DIAGNOSIS — H34812 Central retinal vein occlusion, left eye, with macular edema: Secondary | ICD-10-CM | POA: Diagnosis not present

## 2022-12-12 DIAGNOSIS — Z23 Encounter for immunization: Secondary | ICD-10-CM | POA: Diagnosis not present

## 2022-12-13 ENCOUNTER — Ambulatory Visit: Payer: Medicare Other | Admitting: Neurology

## 2022-12-17 DIAGNOSIS — R1310 Dysphagia, unspecified: Secondary | ICD-10-CM | POA: Diagnosis not present

## 2022-12-17 DIAGNOSIS — K219 Gastro-esophageal reflux disease without esophagitis: Secondary | ICD-10-CM | POA: Diagnosis not present

## 2022-12-31 ENCOUNTER — Other Ambulatory Visit: Payer: Self-pay | Admitting: Cardiology

## 2023-01-04 ENCOUNTER — Encounter (HOSPITAL_BASED_OUTPATIENT_CLINIC_OR_DEPARTMENT_OTHER): Payer: Self-pay | Admitting: Family Medicine

## 2023-01-04 ENCOUNTER — Ambulatory Visit (HOSPITAL_BASED_OUTPATIENT_CLINIC_OR_DEPARTMENT_OTHER)
Admission: RE | Admit: 2023-01-04 | Discharge: 2023-01-04 | Disposition: A | Discharge status: Home / Self Care | Payer: Medicare Other | Source: Ambulatory Visit | Attending: Family Medicine | Admitting: Family Medicine

## 2023-01-04 ENCOUNTER — Other Ambulatory Visit (HOSPITAL_BASED_OUTPATIENT_CLINIC_OR_DEPARTMENT_OTHER): Payer: Self-pay | Admitting: Family Medicine

## 2023-01-04 DIAGNOSIS — R059 Cough, unspecified: Secondary | ICD-10-CM | POA: Insufficient documentation

## 2023-01-04 DIAGNOSIS — R058 Other specified cough: Secondary | ICD-10-CM | POA: Diagnosis not present

## 2023-01-04 DIAGNOSIS — R918 Other nonspecific abnormal finding of lung field: Secondary | ICD-10-CM | POA: Diagnosis not present

## 2023-01-24 ENCOUNTER — Ambulatory Visit: Payer: Medicare Other | Admitting: Podiatry

## 2023-01-31 ENCOUNTER — Ambulatory Visit: Payer: Medicare Other | Admitting: Podiatry

## 2023-02-05 DIAGNOSIS — I83893 Varicose veins of bilateral lower extremities with other complications: Secondary | ICD-10-CM | POA: Diagnosis not present

## 2023-02-08 ENCOUNTER — Ambulatory Visit (INDEPENDENT_AMBULATORY_CARE_PROVIDER_SITE_OTHER): Payer: Medicare Other | Admitting: Podiatry

## 2023-02-08 DIAGNOSIS — M79675 Pain in left toe(s): Secondary | ICD-10-CM

## 2023-02-08 DIAGNOSIS — G609 Hereditary and idiopathic neuropathy, unspecified: Secondary | ICD-10-CM | POA: Diagnosis not present

## 2023-02-08 DIAGNOSIS — I739 Peripheral vascular disease, unspecified: Secondary | ICD-10-CM

## 2023-02-08 DIAGNOSIS — B351 Tinea unguium: Secondary | ICD-10-CM

## 2023-02-08 DIAGNOSIS — M79674 Pain in right toe(s): Secondary | ICD-10-CM | POA: Diagnosis not present

## 2023-02-08 NOTE — Progress Notes (Signed)
  Subjective:  Patient ID: Todd Chan, male    DOB: 12-06-1945,   MRN: [id number]  No chief complaint on file.   77 y.o. male presents for concern of painful thickened and elongated toenails with a history of PVD and neuropathy. Denies diabetes. Hoping to have nails trimmed as well.  . Denies any other pedal complaints. Denies n/v/f/c.   PCP: Myles Lipps DO   Past Medical History:  Diagnosis Date   Arthritis    GERD (gastroesophageal reflux disease)    Glaucoma    Hypertension    Osteoarthritis of knee    Painful urination    Peripheral vascular disease (HCC)    bilateral edema     Objective:  Physical Exam: Vascular: DP/PT pulses 1/4 bilateral. CFT <3 seconds. Normal hair growth on digits. No edema.  Skin. No lacerations or abrasions bilateral feet. Nails 1-5 are thickened discolored and elongated with subungual debris.  Musculoskeletal: MMT 5/5 bilateral lower extremities in DF, PF, Inversion and Eversion. Deceased ROM in DF of ankle joint. No pain to palpation.  Neurological: Sensation diminished to light touch. Protective sensation present to all 10 locations with SWMF.   Assessment:   1. Pain due to onychomycosis of toenails of both feet   2. Idiopathic peripheral neuropathy   3. Peripheral vascular disease (HCC)          Plan:  Patient was evaluated and treated and all questions answered. Discussed neuropathy and etiology as well as treatment with patient.  -Discussed and educated patient on foot care, especially with  regards to the vascular, neurological and musculoskeletal systems.  -Discussed supportive shoes at all times and checking feet regularly.  -Nails 1-5 b/l debrided today without incident.  -Patient to return in 3 months for at risk foot care.    Louann Sjogren, DPM

## 2023-02-11 ENCOUNTER — Other Ambulatory Visit: Payer: Self-pay

## 2023-02-11 ENCOUNTER — Encounter: Payer: Self-pay | Admitting: Cardiology

## 2023-02-11 DIAGNOSIS — I719 Aortic aneurysm of unspecified site, without rupture: Secondary | ICD-10-CM

## 2023-02-12 DIAGNOSIS — K2970 Gastritis, unspecified, without bleeding: Secondary | ICD-10-CM | POA: Diagnosis not present

## 2023-02-12 DIAGNOSIS — K2289 Other specified disease of esophagus: Secondary | ICD-10-CM | POA: Diagnosis not present

## 2023-02-12 DIAGNOSIS — K219 Gastro-esophageal reflux disease without esophagitis: Secondary | ICD-10-CM | POA: Diagnosis not present

## 2023-02-12 DIAGNOSIS — K2270 Barrett's esophagus without dysplasia: Secondary | ICD-10-CM | POA: Diagnosis not present

## 2023-02-12 DIAGNOSIS — R1310 Dysphagia, unspecified: Secondary | ICD-10-CM | POA: Diagnosis not present

## 2023-02-14 DIAGNOSIS — K2270 Barrett's esophagus without dysplasia: Secondary | ICD-10-CM | POA: Diagnosis not present

## 2023-02-14 DIAGNOSIS — K2289 Other specified disease of esophagus: Secondary | ICD-10-CM | POA: Diagnosis not present

## 2023-02-15 ENCOUNTER — Ambulatory Visit (HOSPITAL_BASED_OUTPATIENT_CLINIC_OR_DEPARTMENT_OTHER)
Admission: RE | Admit: 2023-02-15 | Discharge: 2023-02-15 | Disposition: A | Discharge status: Home / Self Care | Payer: Medicare Other | Source: Ambulatory Visit | Attending: Cardiology

## 2023-02-15 DIAGNOSIS — I7121 Aneurysm of the ascending aorta, without rupture: Secondary | ICD-10-CM | POA: Diagnosis not present

## 2023-02-15 DIAGNOSIS — I700 Atherosclerosis of aorta: Secondary | ICD-10-CM | POA: Diagnosis not present

## 2023-02-15 DIAGNOSIS — K8020 Calculus of gallbladder without cholecystitis without obstruction: Secondary | ICD-10-CM | POA: Diagnosis not present

## 2023-02-15 DIAGNOSIS — R590 Localized enlarged lymph nodes: Secondary | ICD-10-CM | POA: Diagnosis not present

## 2023-02-15 DIAGNOSIS — I719 Aortic aneurysm of unspecified site, without rupture: Secondary | ICD-10-CM | POA: Diagnosis not present

## 2023-02-15 MED ORDER — IOHEXOL 350 MG/ML SOLN
100.0000 mL | Freq: Once | INTRAVENOUS | Status: AC | PRN
  Administered 2023-02-15: 100 mL via INTRAVENOUS

## 2023-02-18 ENCOUNTER — Encounter: Payer: Self-pay | Admitting: Cardiology

## 2023-02-20 ENCOUNTER — Encounter: Payer: Self-pay | Admitting: Cardiology

## 2023-02-24 ENCOUNTER — Other Ambulatory Visit: Payer: Self-pay | Admitting: Neurology

## 2023-02-25 NOTE — Telephone Encounter (Signed)
Last seen on 12/04/22 Follow up scheduled on 06/18/23

## 2023-02-26 ENCOUNTER — Encounter: Payer: Self-pay | Admitting: Cardiology

## 2023-02-26 ENCOUNTER — Other Ambulatory Visit: Payer: Self-pay | Admitting: Internal Medicine

## 2023-02-26 ENCOUNTER — Ambulatory Visit: Payer: Medicare Other | Attending: Internal Medicine

## 2023-02-26 DIAGNOSIS — I4891 Unspecified atrial fibrillation: Secondary | ICD-10-CM

## 2023-02-26 DIAGNOSIS — R0609 Other forms of dyspnea: Secondary | ICD-10-CM

## 2023-02-26 DIAGNOSIS — I4510 Unspecified right bundle-branch block: Secondary | ICD-10-CM

## 2023-02-26 DIAGNOSIS — R0602 Shortness of breath: Secondary | ICD-10-CM

## 2023-02-26 DIAGNOSIS — R9431 Abnormal electrocardiogram [ECG] [EKG]: Secondary | ICD-10-CM

## 2023-02-26 NOTE — Progress Notes (Unsigned)
Enrolled for [employer] to mail a ZIO XT long term holter monitor to the patients address on file.  

## 2023-02-26 NOTE — Telephone Encounter (Signed)
Spoke with pt regarding EKG tracing that was sent in earlier today. Reviewed with DOD as pt does not have a history of A-fib and is not currently on anti-coagulation. Per Dr. Wyline Mood, with poor quality of EKG would like to place a monitor to be thorough. Pt is agreeable to this plan. Orders placed for 7 day zio monitor. Instructions discussed with pt and will send instructions via mychart. Pt states that he does have an appointment to see Dr. Servando Salina on 12/19. Pt verbalizes understanding.

## 2023-02-27 ENCOUNTER — Other Ambulatory Visit: Payer: Self-pay

## 2023-02-27 DIAGNOSIS — I719 Aortic aneurysm of unspecified site, without rupture: Secondary | ICD-10-CM

## 2023-02-27 NOTE — Addendum Note (Signed)
Addended by: Reynolds Bowl on: 02/27/2023 04:21 PM   Modules accepted: Orders

## 2023-03-13 ENCOUNTER — Other Ambulatory Visit: Payer: Self-pay | Admitting: Cardiology

## 2023-03-15 DIAGNOSIS — R9431 Abnormal electrocardiogram [ECG] [EKG]: Secondary | ICD-10-CM | POA: Diagnosis not present

## 2023-03-15 DIAGNOSIS — I4891 Unspecified atrial fibrillation: Secondary | ICD-10-CM | POA: Diagnosis not present

## 2023-03-18 ENCOUNTER — Ambulatory Visit: Payer: Medicare Other | Attending: Cardiology | Admitting: Cardiology

## 2023-03-18 ENCOUNTER — Encounter: Payer: Self-pay | Admitting: Cardiology

## 2023-03-18 VITALS — BP 156/86 | HR 79 | Ht 72.0 in | Wt 296.6 lb

## 2023-03-18 DIAGNOSIS — I739 Peripheral vascular disease, unspecified: Secondary | ICD-10-CM

## 2023-03-18 DIAGNOSIS — I491 Atrial premature depolarization: Secondary | ICD-10-CM | POA: Diagnosis not present

## 2023-03-18 DIAGNOSIS — I2510 Atherosclerotic heart disease of native coronary artery without angina pectoris: Secondary | ICD-10-CM

## 2023-03-18 DIAGNOSIS — E782 Mixed hyperlipidemia: Secondary | ICD-10-CM

## 2023-03-18 MED ORDER — CARVEDILOL 6.25 MG PO TABS: 6.2500 mg | ORAL_TABLET | Freq: Two times a day (BID) | ORAL | 2 refills | Status: DC

## 2023-03-18 MED ORDER — CARVEDILOL 12.5 MG PO TABS: 12.5000 mg | ORAL_TABLET | Freq: Two times a day (BID) | ORAL | 3 refills | Status: DC

## 2023-03-18 NOTE — Progress Notes (Signed)
Cardiology Office Note:    Date:  03/18/2023   ID:  Todd Chan, DOB [birth year], MRN [id number]  PCP:  Koren Shiver, DO  Cardiologist:  Thomasene Ripple, DO  Electrophysiologist:  None   Referring MD: Koren Shiver, DO   'I am ok"  History of Present Illness:    Todd Chan is a 77 y.o. male with a hx of mild CAD with 50% proximal LAD stenosis and normal left ventricular function at coronary angiography in 2019 hypertension mild enlargement ascending aorta stable at 41 mm 06/28/2020 last seen 07/11/2020.   He is here today for his wife.  He reports occasional discomfort, particularly in the mornings, which he attributes to the PACs. The patient notes that the discomfort is not severe or painful, but is noticeable. He also reports a swelling in the leg, which is due to be treated with an ablation at the end of January. The patient is currently on carvedilol, which the doctor plans to increase to 12.5mg . The patient also mentions some issues with GERD, which he attributes to late eating and alcohol consumption. He reports that he often eats late at night and consumes alcohol, which he believes may be contributing to his GERD symptoms.   Past Medical History:  Diagnosis Date   Arthritis    GERD (gastroesophageal reflux disease)    Glaucoma    Hypertension    Osteoarthritis of knee    Painful urination    Peripheral vascular disease (HCC)    bilateral edema     Past Surgical History:  Procedure Laterality Date   CARDIAC CATHETERIZATION  04/2017   CATARACT EXTRACTION, BILATERAL Bilateral    Left: 10/30/21, right: 11/20/21.   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   KNEE ARTHROSCOPY Left 6-7 years ago   LEFT HEART CATH AND CORONARY ANGIOGRAPHY N/A 04/17/2017   Procedure: LEFT HEART CATH AND CORONARY ANGIOGRAPHY;  Surgeon: Lennette Bihari, MD;  Location: [facility];  Service: Cardiovascular;   Laterality: N/A;   RADIOLOGY WITH ANESTHESIA N/A 08/30/2020   Procedure: MRI WITH ANESTHESIA BRAIN WITH AND WITHOUT AND MRI CERVICAL SPINE WITH AND WITHOUT;  Surgeon: Radiologist, Medication, MD;  Location: [facility] OR;  Service: Radiology;  Laterality: N/A;   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   TONSILLECTOMY     TOTAL KNEE ARTHROPLASTY Left 10/08/2016   Procedure: LEFT TOTAL KNEE ARTHROPLASTY;  Surgeon: Ollen Gross, MD;  Location: [facility];  Service: Orthopedics;  Laterality: Left;   TOTAL KNEE ARTHROPLASTY Right 06/17/2017   Procedure: RIGHT TOTAL KNEE ARTHROPLASTY;  Surgeon: Ollen Gross, MD;  Location: [facility];  Service: Orthopedics;  Laterality: Right;    Current Medications: Current Meds  Medication Sig   aspirin 81 MG EC tablet 1 tablet   carbidopa-levodopa (SINEMET IR) 25-100 MG tablet TAKE 1 TABLET 4 TIMES DAILY   Cholecalciferol (VITAMIN D3) 50 MCG (2000 UT) capsule Take 2,000 Units by mouth daily.   Coenzyme Q10 (CO Q 10) 100 MG CAPS See admin instructions.   latanoprost (XALATAN) 0.005 % ophthalmic solution Place 1 drop into the left eye at bedtime.   Multiple Vitamin (MULTIVITAMIN) capsule Take 1 capsule by mouth daily.   Omega-3 Fatty Acids (FISH OIL PO) Take 1,290 mg by mouth daily.   pantoprazole (PROTONIX) 40 MG tablet 1 tablet   pravastatin (PRAVACHOL) 20 MG tablet TAKE 1 TABLET DAILY   telmisartan-hydrochlorothiazide (MICARDIS HCT) 40-12.5 MG tablet TAKE 1  TABLET DAILY   [DISCONTINUED] carvedilol (COREG) 6.25 MG tablet Take 1 tablet (6.25 mg total) by mouth 2 (two) times daily.     Allergies:   Patient has no known allergies.   Social History   Socioeconomic History   Marital status: Married    Spouse name: Not on file   Number of children: 2   Years of education: Not on file   Highest education level: Bachelor's degree (e.g., BA, AB, BS)  Occupational History   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[employer]  Tobacco Use   Smoking status:  Former    Current packs/day: 0.00    Average packs/day: 1 pack/day for 20.0 years (20.0 ttl pk-yrs)    Types: Cigarettes    Start date: 10/1967    Quit date: 10/1987    Years since quitting: 35.4   Smokeless tobacco: Never   Tobacco comments:    Quit 34 years ago  Vaping Use   Vaping status: Never Used  Substance and Sexual Activity   Alcohol use: Yes    Alcohol/week: 7.0 standard drinks of alcohol    Types: 7 Glasses of wine per week    Comment: daily, 2-3 glasses scotch per day   Drug use: No   Sexual activity: Yes  Other Topics Concern   Not on file  Social History Narrative   Lives at home with his wife   Left handed   Drinks coffee (2-3 cups per day)   Retired   Social Drivers of Corporate investment banker Strain: Low Risk  (09/07/2022)   Received from [facility], [facility]   Overall Financial Resource Strain (CARDIA)    Difficulty of Paying Living Expenses: Not hard at all  Food Insecurity: No Food Insecurity (09/07/2022)   Received from [facility], [facility]   Hunger Vital Sign    Worried About Running Out of Food in the Last Year: Never true    Ran Out of Food in the Last Year: Never true  Transportation Needs: No Transportation Needs (09/07/2022)   Received from [facility], [facility]   PRAPARE - Transportation    Lack of Transportation (Medical): No    Lack of Transportation (Non-Medical): No  Physical Activity: Sufficiently Active (09/07/2022)   Received from [facility], [facility]   Exercise Vital Sign    Days of Exercise per Week: 3 days    Minutes of Exercise per Session: 60 min  Stress: No Stress Concern Present (09/07/2022)   Received from [facility], [facility] of Occupational [facility] - Occupational Stress Questionnaire    Feeling of Stress : Not at all  Social Connections: Somewhat Isolated (09/07/2022)   Received from [facility], [facility]   Social Network    How would you rate your social network  (family, work, friends)?: Restricted participation with some degree of social isolation     Family History: The patient's family history includes Breast cancer (age of onset: 75) in his sister; Breast cancer (age of onset: 36) in his mother; Healthy in his daughter; Parkinson's disease in his sister; Prostate cancer in his father. There is no history of Neuropathy.  ROS:   Review of Systems  Constitution: Negative for decreased appetite, fever and weight gain.  HENT: Negative for congestion, ear discharge, hoarse voice and sore throat.   Eyes: Negative for discharge, redness, vision loss in right eye and visual halos.  Cardiovascular: Negative for chest pain, dyspnea on  exertion, leg swelling, orthopnea and palpitations.  Respiratory: Negative for cough, hemoptysis, shortness of breath and snoring.   Endocrine: Negative for heat intolerance and polyphagia.  Hematologic/Lymphatic: Negative for bleeding problem. Does not bruise/bleed easily.  Skin: Negative for flushing, nail changes, rash and suspicious lesions.  Musculoskeletal: Negative for arthritis, joint pain, muscle cramps, myalgias, neck pain and stiffness.  Gastrointestinal: Negative for abdominal pain, bowel incontinence, diarrhea and excessive appetite.  Genitourinary: Negative for decreased libido, genital sores and incomplete emptying.  Neurological: Negative for brief paralysis, focal weakness, headaches and loss of balance.  Psychiatric/Behavioral: Negative for altered mental status, depression and suicidal ideas.  Allergic/Immunologic: Negative for HIV exposure and persistent infections.    EKGs/Labs/Other Studies Reviewed:    The following studies were reviewed today:   EKG:  The ekg ordered today demonstrates sinus rhythm  CT chest 05/2020 IMPRESSION: 1. Stable appearance of ascending thoracic aorta with mild aneurysmal dilation. Recommend annual imaging followup by CTA or MRA. This recommendation follows  2010 ACCF/AHA/AATS/ACR/ASA/SCA/SCAI/SIR/STS/SVM Guidelines for the Diagnosis and Management of Patients with Thoracic Aortic Disease. Circulation. 2010; 121: Y782-N562. Aortic aneurysm NOS (ICD10-I71.9) 2. Signs of coronary artery disease as before. 3. Cholelithiasis. 4. Lobular hepatic contours, correlate with any clinical or laboratory evidence of liver disease. 5. Aortic atherosclerosis.   Aortic Atherosclerosis (ICD10-I70.0).     Electronically Signed   By: Donzetta Kohut M.D.   On: 06/28/2020 15:32  LHC 04/2017 Ost LAD to Prox LAD lesion is 15% stenosed. Prox LAD lesion is 10% stenosed. The left ventricular systolic function is normal. LV end diastolic pressure is normal. The left ventricular ejection fraction is 55-65% by visual estimate.   Normal LV function without focal wall motion abnormalities.  EF 55-60%.   No significant coronary obstructive disease, although there is a small area of calcification superior to the ostium of the a short left main, 10-15% luminal irregularity of the proximal LAD and a normal ramus intermediate, left circumflex, and dominant RCA.   RECOMMENDATION: Medical therapy.  Suspect attenuation artifact contributing to the patient's abnormal nuclear stress test.  The patient will return to Dr. Norman Herrlich and will be given preoperative cardiac clearance.    Recent Labs: 10/31/2022: ALT 10; BUN 15; Creatinine, Ser 0.95; Potassium 4.5; Sodium 140; TSH 1.190  Recent Lipid Panel    Component Value Date/Time   CHOL 143 07/11/2020 0929   TRIG 73 07/11/2020 0929   HDL 51 07/11/2020 0929   CHOLHDL 2.8 07/11/2020 0929   LDLCALC 78 07/11/2020 0929    Physical Exam:    VS:  BP (!) 156/86 (BP Location: Left Arm, Patient Position: Sitting)   Pulse 79   Ht 6' (1.829 m)   Wt 296 lb 9.6 oz (134.5 kg)   SpO2 98%   BMI 40.23 kg/m     Wt Readings from Last 3 Encounters:  03/18/23 296 lb 9.6 oz (134.5 kg)  12/04/22 290 lb (131.5 kg)  10/31/22 289  lb (131.1 kg)     GEN: Well nourished, well developed in no acute distress HEENT: Normal NECK: No JVD; No carotid bruits LYMPHATICS: No lymphadenopathy CARDIAC: S1S2 noted,RRR, no murmurs, rubs, gallops RESPIRATORY:  Clear to auscultation without rales, wheezing or rhonchi  ABDOMEN: Soft, non-tender, non-distended, +bowel sounds, no guarding. EXTREMITIES: No edema, No cyanosis, no clubbing MUSCULOSKELETAL:  No deformity  SKIN: Warm and dry NEUROLOGIC:  Alert and oriented x 3, non-focal PSYCHIATRIC:  Normal affect, good insight  ASSESSMENT:    1. Peripheral vascular disease (HCC)  2. Mild CAD   3. PAC (premature atrial contraction)   4. Mixed hyperlipidemia   5. Morbid obesity (HCC)    PLAN:    CAD - anginal symptoms.   Premature Atrial Complexes (PACs) Occasional PACs noted on monitoring (4.3%). No significant symptoms reported. Discussed the difference between PACs and atrial fibrillation, and the implications of each. -Continue current management. No additional medication needed at this time.  Hypertension Blood pressure slightly elevated. Currently on Carvedilol. -Increase Carvedilol to 12.5mg . Monitor for side effects and effectiveness.  Lower Extremity Edema Noted some edema in the lower extremities. Patient has upcoming ablation procedure for varicose veins. -Continue current management. Monitor edema post-ablation procedure. Consider diuretic therapy if edema worsens post-procedure.   The patient is in agreement with the above plan. The patient left the office in stable condition.  The patient will follow up in   Medication Adjustments/Labs and Tests Ordered: Current medicines are reviewed at length with the patient today.  Concerns regarding medicines are outlined above.  Orders Placed This Encounter  Procedures   EKG 12-Lead   Meds ordered this encounter  Medications   DISCONTD: carvedilol (COREG) 12.5 MG tablet    Sig: Take 1 tablet (12.5 mg total) by  mouth 2 (two) times daily.    Dispense:  180 tablet    Refill:  3   DISCONTD: carvedilol (COREG) 6.25 MG tablet    Sig: Take 1 tablet (6.25 mg total) by mouth 2 (two) times daily.    Dispense:  120 tablet    Refill:  2   carvedilol (COREG) 12.5 MG tablet    Sig: Take 1 tablet (12.5 mg total) by mouth 2 (two) times daily.    Dispense:  180 tablet    Refill:  3    Patient Instructions  Medication Instructions:  Increase: Carvedilol (Coreg) to 12.5 mg two times daily *If you need a refill on your cardiac medications before your next appointment, please call your pharmacy*  Lab Work: None  Follow-Up: At [facility], you and your health needs are our priority.  As part of our continuing mission to provide you with exceptional heart care, we have created designated Provider Care Teams.  These Care Teams include your primary Cardiologist (physician) and Advanced Practice Providers (APPs -  Physician Assistants and Nurse Practitioners) who all work together to provide you with the care you need, when you need it.  Your next appointment:   4  months  Provider:   Thomasene Ripple, DO     Adopting a Healthy Lifestyle.  Know what a healthy weight is for you (roughly BMI <25) and aim to maintain this   Aim for 7+ servings of fruits and vegetables daily   65-80+ fluid ounces of water or unsweet tea for healthy kidneys   Limit to max 1 drink of alcohol per day; avoid smoking/tobacco   Limit animal fats in diet for cholesterol and heart health - choose grass fed whenever available   Avoid highly processed foods, and foods high in saturated/trans fats   Aim for low stress - take time to unwind and care for your mental health   Aim for 150 min of moderate intensity exercise weekly for heart health, and weights twice weekly for bone health   Aim for 7-9 hours of sleep daily   When it comes to diets, agreement about the perfect plan isnt easy to find, even among the  experts. Experts at the [employer] developed an  idea known as the Healthy Eating Plate. Just imagine a plate divided into logical, healthy portions.   The emphasis is on diet quality:   Load up on vegetables and fruits - one-half of your plate: Aim for color and variety, and remember that potatoes dont count.   Go for whole grains - one-quarter of your plate: Whole wheat, barley, wheat berries, quinoa, oats, brown rice, and foods made with them. If you want pasta, go with whole wheat pasta.   Protein power - one-quarter of your plate: Fish, chicken, beans, and nuts are all healthy, versatile protein sources. Limit red meat.   The diet, however, does go beyond the plate, offering a few other suggestions.   Use healthy plant oils, such as olive, canola, soy, corn, sunflower and peanut. Check the labels, and avoid partially hydrogenated oil, which have unhealthy trans fats.   If youre thirsty, drink water. Coffee and tea are good in moderation, but skip sugary drinks and limit milk and dairy products to one or two daily servings.   The type of carbohydrate in the diet is more important than the amount. Some sources of carbohydrates, such as vegetables, fruits, whole grains, and beans-are healthier than others.   Finally, stay active  Signed, Thomasene Ripple, DO  03/18/2023 3:44 PM    [facility]

## 2023-03-18 NOTE — Patient Instructions (Addendum)
Medication Instructions:  Increase: Carvedilol (Coreg) to 12.5 mg two times daily *If you need a refill on your cardiac medications before your next appointment, please call your pharmacy*  Lab Work: None  Follow-Up: At [facility], you and your health needs are our priority.  As part of our continuing mission to provide you with exceptional heart care, we have created designated Provider Care Teams.  These Care Teams include your primary Cardiologist (physician) and Advanced Practice Providers (APPs -  Physician Assistants and Nurse Practitioners) who all work together to provide you with the care you need, when you need it.  Your next appointment:   4  months  Provider:   Thomasene Ripple, DO

## 2023-03-20 DIAGNOSIS — I10 Essential (primary) hypertension: Secondary | ICD-10-CM | POA: Diagnosis not present

## 2023-03-20 DIAGNOSIS — J9809 Other diseases of bronchus, not elsewhere classified: Secondary | ICD-10-CM | POA: Diagnosis not present

## 2023-03-20 DIAGNOSIS — I7121 Aneurysm of the ascending aorta, without rupture: Secondary | ICD-10-CM | POA: Diagnosis not present

## 2023-03-20 DIAGNOSIS — N644 Mastodynia: Secondary | ICD-10-CM | POA: Diagnosis not present

## 2023-04-16 DIAGNOSIS — R058 Other specified cough: Secondary | ICD-10-CM | POA: Diagnosis not present

## 2023-04-16 DIAGNOSIS — R6883 Chills (without fever): Secondary | ICD-10-CM | POA: Diagnosis not present

## 2023-04-16 DIAGNOSIS — J208 Acute bronchitis due to other specified organisms: Secondary | ICD-10-CM | POA: Diagnosis not present

## 2023-04-16 DIAGNOSIS — B9689 Other specified bacterial agents as the cause of diseases classified elsewhere: Secondary | ICD-10-CM | POA: Diagnosis not present

## 2023-04-16 DIAGNOSIS — J069 Acute upper respiratory infection, unspecified: Secondary | ICD-10-CM | POA: Diagnosis not present

## 2023-05-02 ENCOUNTER — Ambulatory Visit: Payer: Medicare Other | Admitting: Physical Therapy

## 2023-05-02 ENCOUNTER — Encounter: Payer: Self-pay | Admitting: Podiatry

## 2023-05-02 ENCOUNTER — Ambulatory Visit (INDEPENDENT_AMBULATORY_CARE_PROVIDER_SITE_OTHER): Payer: Medicare Other | Admitting: Podiatry

## 2023-05-02 DIAGNOSIS — G609 Hereditary and idiopathic neuropathy, unspecified: Secondary | ICD-10-CM | POA: Diagnosis not present

## 2023-05-02 DIAGNOSIS — M79674 Pain in right toe(s): Secondary | ICD-10-CM | POA: Diagnosis not present

## 2023-05-02 DIAGNOSIS — M79675 Pain in left toe(s): Secondary | ICD-10-CM

## 2023-05-02 DIAGNOSIS — B351 Tinea unguium: Secondary | ICD-10-CM

## 2023-05-02 DIAGNOSIS — I739 Peripheral vascular disease, unspecified: Secondary | ICD-10-CM | POA: Diagnosis not present

## 2023-05-02 NOTE — Progress Notes (Signed)
  Subjective:  Patient ID: Todd Chan, male    DOB: 04/12/1945,   MRN: [id number]  Chief Complaint  Patient presents with   Nail Problem    RFC    78 y.o. male presents for concern of painful thickened and elongated toenails with a history of PVD and neuropathy. Denies diabetes. Hoping to have nails trimmed   . Denies any other pedal complaints. Denies n/v/f/c.   PCP: Myles Lipps DO   Past Medical History:  Diagnosis Date   Arthritis    GERD (gastroesophageal reflux disease)    Glaucoma    Hypertension    Osteoarthritis of knee    Painful urination    Peripheral vascular disease (HCC)    bilateral edema     Objective:  Physical Exam: Vascular: DP/PT pulses 1/4 bilateral. CFT <3 seconds. Normal hair growth on digits. No edema.  Skin. No lacerations or abrasions bilateral feet. Nails 1-5 are thickened discolored and elongated with subungual debris.  Musculoskeletal: MMT 5/5 bilateral lower extremities in DF, PF, Inversion and Eversion. Deceased ROM in DF of ankle joint. No pain to palpation.  Neurological: Sensation diminished to light touch. Protective sensation present to all 10 locations with SWMF.   Assessment:   1. Pain due to onychomycosis of toenails of both feet   2. Peripheral vascular disease (HCC)   3. Idiopathic peripheral neuropathy          Plan:  Patient was evaluated and treated and all questions answered. Discussed neuropathy and etiology as well as treatment with patient.  -Discussed and educated patient on foot care, especially with  regards to the vascular, neurological and musculoskeletal systems.  -Discussed supportive shoes at all times and checking feet regularly.  -Nails 1-5 b/l debrided today without incident.  -Patient to return in 3 months for at risk foot care.    Louann Sjogren, DPM

## 2023-05-06 ENCOUNTER — Emergency Department (HOSPITAL_BASED_OUTPATIENT_CLINIC_OR_DEPARTMENT_OTHER): Payer: Medicare Other | Admitting: Radiology

## 2023-05-06 ENCOUNTER — Emergency Department (HOSPITAL_BASED_OUTPATIENT_CLINIC_OR_DEPARTMENT_OTHER)
Admission: EM | Admit: 2023-05-06 | Discharge: 2023-05-06 | Disposition: A | Discharge status: Home / Self Care | Payer: Medicare Other

## 2023-05-06 ENCOUNTER — Encounter (HOSPITAL_BASED_OUTPATIENT_CLINIC_OR_DEPARTMENT_OTHER): Payer: Self-pay | Admitting: Emergency Medicine

## 2023-05-06 ENCOUNTER — Other Ambulatory Visit: Payer: Self-pay

## 2023-05-06 DIAGNOSIS — Z20822 Contact with and (suspected) exposure to covid-19: Secondary | ICD-10-CM | POA: Insufficient documentation

## 2023-05-06 DIAGNOSIS — R918 Other nonspecific abnormal finding of lung field: Secondary | ICD-10-CM | POA: Diagnosis not present

## 2023-05-06 DIAGNOSIS — R079 Chest pain, unspecified: Secondary | ICD-10-CM | POA: Diagnosis not present

## 2023-05-06 DIAGNOSIS — R142 Eructation: Secondary | ICD-10-CM | POA: Diagnosis not present

## 2023-05-06 DIAGNOSIS — Z7982 Long term (current) use of aspirin: Secondary | ICD-10-CM | POA: Insufficient documentation

## 2023-05-06 DIAGNOSIS — G20C Parkinsonism, unspecified: Secondary | ICD-10-CM | POA: Insufficient documentation

## 2023-05-06 DIAGNOSIS — R059 Cough, unspecified: Secondary | ICD-10-CM | POA: Insufficient documentation

## 2023-05-06 DIAGNOSIS — I10 Essential (primary) hypertension: Secondary | ICD-10-CM | POA: Insufficient documentation

## 2023-05-06 DIAGNOSIS — R002 Palpitations: Secondary | ICD-10-CM | POA: Insufficient documentation

## 2023-05-06 DIAGNOSIS — Z79899 Other long term (current) drug therapy: Secondary | ICD-10-CM | POA: Diagnosis not present

## 2023-05-06 LAB — RESP PANEL BY RT-PCR (RSV, FLU A&B, COVID)  RVPGX2
Influenza A by PCR: NEGATIVE
Influenza B by PCR: NEGATIVE
Resp Syncytial Virus by PCR: NEGATIVE
SARS Coronavirus 2 by RT PCR: NEGATIVE

## 2023-05-06 LAB — CBC
HCT: 42.3 % (ref 39.0–52.0)
Hemoglobin: 14.5 g/dL (ref 13.0–17.0)
MCH: 33.9 pg (ref 26.0–34.0)
MCHC: 34.3 g/dL (ref 30.0–36.0)
MCV: 98.8 fL (ref 80.0–100.0)
Platelets: 161 10*3/uL (ref 150–400)
RBC: 4.28 MIL/uL (ref 4.22–5.81)
RDW: 13 % (ref 11.5–15.5)
WBC: 5.5 10*3/uL (ref 4.0–10.5)
nRBC: 0 % (ref 0.0–0.2)

## 2023-05-06 LAB — BASIC METABOLIC PANEL
Anion gap: 8 (ref 5–15)
BUN: 12 mg/dL (ref 8–23)
CO2: 30 mmol/L (ref 22–32)
Calcium: 9.1 mg/dL (ref 8.9–10.3)
Chloride: 101 mmol/L (ref 98–111)
Creatinine, Ser: 0.81 mg/dL (ref 0.61–1.24)
GFR, Estimated: >60 mL/min (ref >60)
Glucose, Bld: 112 mg/dL — ABNORMAL HIGH (ref 70–99)
Potassium: 4 mmol/L (ref 3.5–5.1)
Sodium: 139 mmol/L (ref 135–145)

## 2023-05-06 LAB — TROPONIN I (HIGH SENSITIVITY)
Troponin I (High Sensitivity): 5 ng/L (ref <18)
Troponin I (High Sensitivity): 5 ng/L (ref <18)

## 2023-05-06 LAB — MAGNESIUM: Magnesium: 2.1 mg/dL (ref 1.7–2.4)

## 2023-05-06 NOTE — ED Notes (Signed)
 Discharge paperwork given and verbally understood.

## 2023-05-06 NOTE — ED Provider Notes (Signed)
[place] EMERGENCY DEPARTMENT AT [facility] Provider Note   CSN: [id number] Arrival date & time: 05/06/23  0830     History  Chief Complaint  Patient presents with   Palpitations    Todd Chan is a 78 y.o. male.  With a history of hypertension, peripheral vascular disease, GERD, Parkinson's disease, fibromyalgia presenting to the ED for evaluation of palpitations.  Specifically noticed a few episodes of an uneasy feeling in his chest that he described as palpitations yesterday.  Denies any pain or pressure in the chest.  No shortness of breath.  Occasional cough which is nonproductive and not new for him.  He reports some chills and nausea as well.  Has a history of left leg swelling due to vascular insufficiency which has been slightly worse over the last week.  No abdominal pain.  No fevers.  He reports compliance with his medications.  States his anxiety occasionally gets the best of him as well.   Palpitations Associated symptoms: nausea        Home Medications Prior to Admission medications   Medication Sig Start Date End Date Taking? Authorizing Provider  aspirin 81 MG EC tablet 1 tablet    [provider]  carbidopa-levodopa (SINEMET IR) 25-100 MG tablet TAKE 1 TABLET 4 TIMES DAILY 02/25/23   Sater, Pearletha Furl, MD  carvedilol (COREG) 12.5 MG tablet Take 1 tablet (12.5 mg total) by mouth 2 (two) times daily. 03/18/23   Tobb, Kardie, DO  Cholecalciferol (VITAMIN D3) 50 MCG (2000 UT) capsule Take 2,000 Units by mouth daily.    [provider]  Coenzyme Q10 (CO Q 10) 100 MG CAPS See admin instructions.    [provider]  latanoprost (XALATAN) 0.005 % ophthalmic solution Place 1 drop into the left eye at bedtime.    [provider]  Multiple Vitamin (MULTIVITAMIN) capsule Take 1 capsule by mouth daily.    [provider]  Omega-3 Fatty Acids (FISH OIL PO) Take 1,290 mg by mouth daily.    [provider]   pantoprazole (PROTONIX) 40 MG tablet 1 tablet    [provider]  pravastatin (PRAVACHOL) 20 MG tablet TAKE 1 TABLET DAILY 01/01/23   Tobb, Kardie, DO  primidone (MYSOLINE) 50 MG tablet One po tid 12/04/22   Sater, Pearletha Furl, MD  telmisartan-hydrochlorothiazide (MICARDIS HCT) 40-12.5 MG tablet TAKE 1 TABLET DAILY 03/13/23   Tobb, Kardie, DO      Allergies    Patient has no known allergies.    Review of Systems   Review of Systems  Cardiovascular:  Positive for palpitations.  Gastrointestinal:  Positive for nausea.  All other systems reviewed and are negative.   Physical Exam Updated Vital Signs BP (!) 145/83   Pulse 74   Temp 98.2 F (36.8 C) (Oral)   Resp 18   Ht 6' (1.829 m)   Wt 131.5 kg   SpO2 97%   BMI 39.33 kg/m  Physical Exam Vitals and nursing note reviewed.  Constitutional:      General: He is not in acute distress.    Appearance: Normal appearance. He is normal weight. He is not ill-appearing.     Comments: Resting comfortably in bed  HENT:     Head: Normocephalic and atraumatic.  Pulmonary:     Effort: Pulmonary effort is normal. No respiratory distress.     Breath sounds: No wheezing, rhonchi or rales.  Abdominal:     General: Abdomen is flat.  Musculoskeletal:  General: Normal range of motion.     Cervical back: Neck supple.  Skin:    General: Skin is warm and dry.  Neurological:     Mental Status: He is alert and oriented to person, place, and time.  Psychiatric:        Mood and Affect: Mood normal.        Behavior: Behavior normal.     ED Results / Procedures / Treatments   Labs (all labs ordered are listed, but only abnormal results are displayed) Labs Reviewed  BASIC METABOLIC PANEL - Abnormal; Notable for the following components:      Result Value   Glucose, Bld 112 (*)    All other components within normal limits  RESP PANEL BY RT-PCR (RSV, FLU A&B, COVID)  RVPGX2  CBC  MAGNESIUM  TROPONIN I (HIGH SENSITIVITY)   TROPONIN I (HIGH SENSITIVITY)    EKG None  Radiology DG Chest 2 View Result Date: 05/06/2023 CLINICAL DATA:  Palpitations.  Chest pain. EXAM: CHEST - 2 VIEW COMPARISON:  01/04/2023 FINDINGS: Low volume film. Similar chronic subsegmental atelectasis or scarring at the lung bases. No pulmonary edema or focal airspace consolidation. No substantial pleural effusion. Cardiopericardial silhouette is at upper limits of normal for size. No acute bony abnormality. IMPRESSION: Low volume film without acute cardiopulmonary findings. Stable chronic atelectasis or scarring at the lung bases. Electronically Signed   By: Kennith Center M.D.   On: 05/06/2023 09:23    Procedures Procedures    Medications Ordered in ED Medications - No data to display  ED Course/ Medical Decision Making/ A&P                                 Medical Decision Making Amount and/or Complexity of Data Reviewed Labs: ordered. Radiology: ordered.  This patient presents to the ED for concern of palpitations, this involves an extensive number of treatment options, and is a complaint that carries with it a high risk of complications and morbidity.  The differential diagnosis for palpitations includes cardiac arrhythmias, PVC/PAC, ACS, Cardiomyopathy, CHF, MVP, pericarditis, valvular disease, Panic/Anxiety, Somatic disorder, ETOH, Caffeine,  Stimulant use, medication side effect, Anemia, Hyperthyroidism, pulmonary embolism.   My initial workup includes labs, imaging, EKG  Additional history obtained from: Nursing notes from this visit. Wife at bedside provides portion of the history  I ordered, reviewed and interpreted labs which include: CBC, BMP, troponin, delta troponin, magnesium, respiratory panel  I ordered imaging studies including chest x-ray I independently visualized and interpreted imaging which showed negative I agree with the radiologist interpretation  Cardiac Monitoring:  The patient was maintained on a  cardiac monitor.  I personally viewed and interpreted the cardiac monitored which showed an underlying rhythm of: NSR, RBBB  Afebrile, hemodynamically stable.  78 year old male presenting to the ED for evaluation of palpitations and belching.  Symptoms began yesterday evening.  He reports compliance with his medications.  He appears very well on physical exam.  Does have a resting tremor.  Lab workup reassuring.  Initial and delta troponin negative.  EKG without acute ischemic changes.  Chest x-ray negative.  Lower suspicion for ACS.  Electrolytes within normal limits.  He was encouraged to increase his Pepcid use and add simethicone for his symptoms.  He was encouraged to follow-up with his primary care provider.  He was given return precautions.  Stable at discharge.  At this time there does not appear  to be any evidence of an acute emergency medical condition and the patient appears stable for discharge with appropriate outpatient follow up. Diagnosis was discussed with patient who verbalizes understanding of care plan and is agreeable to discharge. I have discussed return precautions with patient and wife who verbalizes understanding. Patient encouraged to follow-up with their PCP within 1 week. All questions answered.  Patient's case discussed with Dr. Maple Hudson who agrees with plan to discharge with follow-up.   Note: Portions of this report may have been transcribed using voice recognition software. Every effort was made to ensure accuracy; however, inadvertent computerized transcription errors may still be present.         Final Clinical Impression(s) / ED Diagnoses Final diagnoses:  Palpitations  Belching    Rx / DC Orders ED Discharge Orders     None         Michelle Piper, PA-C 05/06/23 1205    Coral Spikes, DO 05/06/23 1616

## 2023-05-06 NOTE — ED Triage Notes (Signed)
Pt caox4, ambulatory c/o multiple episodes of palpitations since yesterday, pt further states he has "had a lot of nausea and belching over the past week.

## 2023-05-06 NOTE — Discharge Instructions (Signed)
You have been seen today for your complaint of palpitations, bulging. Your lab work was reassuring. Your imaging was reassuring. Your discharge medications include Pepcid.  You may take this medicine twice daily for 1 to 2 days to help with GERD.  You may also take simethicone which is sold over-the-counter as Gas-X. Follow up with: Your primary care provider Please seek immediate medical care if you develop any of the following symptoms: You have chest pain or shortness of breath. You have a severe headache. You feel dizzy or you faint. At this time there does not appear to be the presence of an emergent medical condition, however there is always the potential for conditions to change. Please read and follow the below instructions.  Do not take your medicine if  develop an itchy rash, swelling in your mouth or lips, or difficulty breathing; call 911 and seek immediate emergency medical attention if this occurs.  You may review your lab tests and imaging results in their entirety on your MyChart account.  Please discuss all results of fully with your primary care provider and other specialist at your follow-up visit.  Note: Portions of this text may have been transcribed using voice recognition software. Every effort was made to ensure accuracy; however, inadvertent computerized transcription errors may still be present.

## 2023-05-07 ENCOUNTER — Encounter: Payer: Self-pay | Admitting: Cardiology

## 2023-05-08 DIAGNOSIS — I83812 Varicose veins of left lower extremities with pain: Secondary | ICD-10-CM | POA: Diagnosis not present

## 2023-05-09 ENCOUNTER — Ambulatory Visit: Payer: Medicare Other | Admitting: Podiatry

## 2023-06-04 DIAGNOSIS — I872 Venous insufficiency (chronic) (peripheral): Secondary | ICD-10-CM | POA: Diagnosis not present

## 2023-06-04 DIAGNOSIS — I83899 Varicose veins of unspecified lower extremities with other complications: Secondary | ICD-10-CM | POA: Diagnosis not present

## 2023-06-04 DIAGNOSIS — I83819 Varicose veins of unspecified lower extremities with pain: Secondary | ICD-10-CM | POA: Diagnosis not present

## 2023-06-14 ENCOUNTER — Ambulatory Visit: Attending: Emergency Medicine | Admitting: Emergency Medicine

## 2023-06-14 ENCOUNTER — Encounter: Payer: Self-pay | Admitting: Emergency Medicine

## 2023-06-14 ENCOUNTER — Other Ambulatory Visit: Payer: Self-pay | Admitting: *Deleted

## 2023-06-14 VITALS — BP 146/70 | HR 65 | Ht 72.0 in | Wt 291.8 lb

## 2023-06-14 DIAGNOSIS — I7121 Aneurysm of the ascending aorta, without rupture: Secondary | ICD-10-CM | POA: Insufficient documentation

## 2023-06-14 DIAGNOSIS — I10 Essential (primary) hypertension: Secondary | ICD-10-CM | POA: Insufficient documentation

## 2023-06-14 DIAGNOSIS — I739 Peripheral vascular disease, unspecified: Secondary | ICD-10-CM

## 2023-06-14 DIAGNOSIS — I2510 Atherosclerotic heart disease of native coronary artery without angina pectoris: Secondary | ICD-10-CM | POA: Insufficient documentation

## 2023-06-14 DIAGNOSIS — I491 Atrial premature depolarization: Secondary | ICD-10-CM | POA: Insufficient documentation

## 2023-06-14 DIAGNOSIS — E782 Mixed hyperlipidemia: Secondary | ICD-10-CM

## 2023-06-14 MED ORDER — TELMISARTAN-HCTZ 80-12.5 MG PO TABS: 1.0000 | ORAL_TABLET | Freq: Every day | ORAL | 1 refills | Status: DC

## 2023-06-14 NOTE — Patient Instructions (Signed)
 Medication Instructions:  START MICARDIS HCT 80-12.5 MG DAILY.   Lab Work: Nutritional therapist IN 2 WEEKS   Testing/Procedures: NONE   Follow-Up: At [facility], you and your health needs are our priority.  As part of our continuing mission to provide you with exceptional heart care, we have created designated Provider Care Teams.  These Care Teams include your primary Cardiologist (physician) and Advanced Practice Providers (APPs -  Physician Assistants and Nurse Practitioners) who all work together to provide you with the care you need, when you need it.   Your next appointment:   KEEP SCHEDULED APPOINTMENT IN APRIL WITH DR. TOBB  Provider:   Thomasene Ripple, DO   Other Instructions:

## 2023-06-14 NOTE — Progress Notes (Signed)
 Cardiology Office Note:    Date:  06/14/2023  ID:  [name], DOB 1945/12/20, MRN [id number] PCP: Koren Shiver, DO  [facility] Providers Cardiologist:  Thomasene Ripple, DO       Patient Profile:      Chief Complaint: 33-month follow-up for hypertension History of Present Illness:  Todd Chan is a 78 y.o. male with visit-pertinent history of mild coronary artery disease, hypertension, Parkinson's, Barrett's esophagus, metabolic syndrome, vitamin D deficiency, mild enlargement of ascending aorta  He underwent cardiac catheterization in 2019 which showed 15% stenosis of proximal LAD and normal LV function.  Evaluation of his ascending aorta on 06/28/2020 showed 41 mm enlargement.  Echocardiogram 09/11/2022 showed an LVEF of 60 to 65%, grade 1 diastolic dysfunction, mild mitral valve regurgitation, mild dilation of ascending aorta measuring 40 mm.  Most recent CT angio chest aorta 02/15/2023 showed stable ascending aortic aneurysm measuring 4 cm.1  Last seen in office on 03/18/2023.  Patient noted to have mild lower extremity swelling and GERD.  He had an upcoming ablation procedure for varicose veins.  His carvedilol was increased to 12.5 twice daily for blood pressure of 156/86.  He was to follow-up in 4 months for blood pressure check.   Discussed the use of AI scribe software for clinical note transcription with the patient, who gave verbal consent to proceed.  The patient presents today with his wife.  He does present with poorly controlled blood pressure today at 152/90 despite being on carvedilol and telmisartan-hydrochlorothiazide.  His blood pressures at home average 140s to 150s.  The patient reports occasional lightheadedness, which is more pronounced when standing up from a sitting position. The patient also experiences frequent belching, which he attributes to his Barrett's esophagus. The patient's wife reports that the patient's symptoms seem to worsen after heavy  meals. The patient also mentions a recent episode of high blood pressure and increased heart rate, which was accompanied by nausea and chills.  The patient does admit to anxiety regarding his blood pressure and notes he feels his anxiety typically raises his blood pressure.  However he is without any exertional angina, chest pains, dyspnea, palpitations, tachycardia, orthopnea, PND, lower extremity swelling, syncope, presyncope    Review of systems:  Please see the history of present illness. All other systems are reviewed and otherwise negative.     Home Medications:    Current Meds  Medication Sig   aspirin 81 MG EC tablet 1 tablet   carbidopa-levodopa (SINEMET IR) 25-100 MG tablet TAKE 1 TABLET 4 TIMES DAILY   carvedilol (COREG) 12.5 MG tablet Take 1 tablet (12.5 mg total) by mouth 2 (two) times daily.   Cholecalciferol (VITAMIN D3) 50 MCG (2000 UT) capsule Take 2,000 Units by mouth daily.   Coenzyme Q10 (CO Q 10) 100 MG CAPS See admin instructions.   latanoprost (XALATAN) 0.005 % ophthalmic solution Place 1 drop into the left eye at bedtime.   Multiple Vitamin (MULTIVITAMIN) capsule Take 1 capsule by mouth daily.   Omega-3 Fatty Acids (FISH OIL PO) Take 1,290 mg by mouth daily.   pantoprazole (PROTONIX) 40 MG tablet 1 tablet   pravastatin (PRAVACHOL) 20 MG tablet TAKE 1 TABLET DAILY   telmisartan-hydrochlorothiazide (MICARDIS HCT) 40-12.5 MG tablet TAKE 1 TABLET DAILY   telmisartan-hydrochlorothiazide (MICARDIS HCT) 80-12.5 MG tablet Take 1 tablet by mouth daily.   Studies Reviewed:       ZIO 02/26/2023 Patch Wear Time:  6 days and 22 hours (2024-12-02T11:01:44-0500 to  2024-12-09T09:37:31-0500)   Patient had a min HR of 57 bpm, max HR of 187 bpm, and avg HR of 75 bpm. Predominant underlying rhythm was Sinus Rhythm. Bundle Branch Block/IVCD was present. 3 Supraventricular Tachycardia runs occurred, the run with the fastest interval lasting 5 beats  with a max rate of 187 bpm, the  longest lasting 8 beats with an avg rate of 140 bpm. Isolated SVEs were occasional (4.3%, 31848), SVE Couplets were rare (<1.0%, 27), and SVE Triplets were rare (<1.0%, 13). Isolated VEs were rare (<1.0%), and no VE  Couplets or VE Triplets were present.  Echocardiogram 09/11/2022 1. Left ventricular ejection fraction, by estimation, is 60 to 65%. The  left ventricle has normal function. The left ventricle has no regional  wall motion abnormalities. Left ventricular diastolic parameters are  consistent with Grade I diastolic  dysfunction (impaired relaxation). The average left ventricular global  longitudinal strain is -23.8 %.   2. Right ventricular systolic function is normal. The right ventricular  size is normal. Tricuspid regurgitation signal is inadequate for assessing  PA pressure.   3. The mitral valve is normal in structure. Mild mitral valve  regurgitation. No evidence of mitral stenosis.   4. The aortic valve is normal in structure. Aortic valve regurgitation is  not visualized. No aortic stenosis is present.   5. There is mild dilatation of the ascending aorta, measuring 40 mm.   6. The inferior vena cava is normal in size with greater than 50%  respiratory variability, suggesting right atrial pressure of 3 mmHg.   Risk Assessment/Calculations:     HYPERTENSION CONTROL Vitals:   06/14/23 1055 06/14/23 1150  BP: (!) 152/90 (!) 146/70    The patient's blood pressure is elevated above target today.  In order to address the patient's elevated BP: A current anti-hypertensive medication was adjusted today.          Physical Exam:   VS:  BP (!) 146/70 (BP Location: Right Arm, Patient Position: Sitting, Cuff Size: Normal)   Pulse 65   Ht 6' (1.829 m)   Wt 291 lb 12.8 oz (132.4 kg)   SpO2 95%   BMI 39.58 kg/m    Wt Readings from Last 3 Encounters:  06/14/23 291 lb 12.8 oz (132.4 kg)  05/06/23 290 lb (131.5 kg)  03/18/23 296 lb 9.6 oz (134.5 kg)    GEN: Well  nourished, well developed in no acute distress NECK: No JVD; No carotid bruits CARDIAC: RRR, no murmurs, rubs, gallops RESPIRATORY:  Clear to auscultation without rales, wheezing or rhonchi  ABDOMEN: Soft, non-tender, non-distended EXTREMITIES:  No edema; No acute deformity      Assessment and Plan:  Hypertension Blood pressure today is 152/90 and repeat 146/70 and not currently under adequate control.  His blood pressures at home range 140s-160s. -Plan to increase telmisartan-HCTZ from 40-12.5 mg daily to 80-12.5 mg daily -Continue carvedilol 12.5 mg twice daily.  Preferred not to increase carvedilol at this time given HR of 65 and history of Parkinson's disease.  Do not want to increase chances of fall in future -Continue monitoring BP at home, if BP remains above goal of less than 130/80 can further increase telmisartan-HCTZ  Coronary artery disease / Hyperlipidemia LHC on 04/2017 w/ Ost LAD to Prox LAD lesion with 15% stenosis & Prox LAD lesion with 10% stenosis LDL 82 on 08/2022 and slightly above goal of less than 70 -Today and since last office visit patient has been without any ischemic symptoms.  There is no indication for further evaluation at this time -Currently being managed by PCP for which he has repeat labs and office visit next month where he will have his lipid panel redrawn -Continue pravastatin 20 mg daily  Ascending aortic aneurysm CT angio chest 02/2023 shows stable ascending aortic aneurysm measuring 4cm -Repeat CT angio chest has been ordered to be completed 02/2024  PACs Quiescent Monitor 02/2023 showed 4.3% PAC burden -Today and since last office visit he has been asymptomatic -Continue carvedilol 12.5 mg twice daily            Dispo:  Return in about 1 month (around 07/15/2023).  Signed, Denyce Robert, NP

## 2023-06-18 ENCOUNTER — Encounter: Payer: Self-pay | Admitting: Neurology

## 2023-06-18 ENCOUNTER — Ambulatory Visit (INDEPENDENT_AMBULATORY_CARE_PROVIDER_SITE_OTHER): Payer: Medicare Other | Admitting: Neurology

## 2023-06-18 ENCOUNTER — Telehealth: Payer: Self-pay | Admitting: Neurology

## 2023-06-18 VITALS — BP 142/88 | Resp 16 | Ht 72.0 in | Wt 289.5 lb

## 2023-06-18 DIAGNOSIS — G20A1 Parkinson's disease without dyskinesia, without mention of fluctuations: Secondary | ICD-10-CM

## 2023-06-18 DIAGNOSIS — R42 Dizziness and giddiness: Secondary | ICD-10-CM

## 2023-06-18 DIAGNOSIS — G250 Essential tremor: Secondary | ICD-10-CM

## 2023-06-18 DIAGNOSIS — G629 Polyneuropathy, unspecified: Secondary | ICD-10-CM

## 2023-06-18 DIAGNOSIS — R251 Tremor, unspecified: Secondary | ICD-10-CM

## 2023-06-18 MED ORDER — LORAZEPAM 0.5 MG PO TABS: ORAL_TABLET | ORAL | 5 refills | Status: DC

## 2023-06-18 NOTE — Telephone Encounter (Signed)
 Referral for neurology fax to [facility]. Phone: [phone], Fax: [phone]

## 2023-06-18 NOTE — Progress Notes (Signed)
 [facility]  PATIENT: JAICOB DIA DOB: [birth year]  REFERRING DOCTOR OR PCP:  Myles Lipps, DO SOURCE: patient, notes from Dr. 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 man with spells of lightheadedness, nausea, tremors and neuropathy.    Update 06/18/2023: The tremor and the gait have mildly worsened compared to last year.  The tremor is always greater on the left.  He has had some features more consistent with Parkinson's disease and of the features more consistent with a benign essential tremor.  He is very claustrophobic and prefers not to do the DaTscan to further clarify.  He also notes more anxiety.    The tremor is most pronounced in the hands though he has a mild tremor in the chin..  His gait is mildly off balance though he has no falls   he is on Sinemet 25/100 qi without much benefit.    He has seen Dr. Arbutus Leas in the past and she had diagnosed him with Parkinson's disease.   THe tremor is left > right and is present at rest but appears higher frequency with intention.  He is left-handed.  Primidone had not helped and was not well tolerated.  It did not seem to help noticeably.    Ativan had npt helped.  Metoprolol and carvedilol had not helped much.    The tremor gets better after 2 drinks (1.5 ounce shots and [place] over ice).    More recently he has had tremor in the legs    He snores but no witnessed apnea.  He has occasional spells of lightheadedness - usually right after standing up after sitting longer time.  These spells last 30-60 seconds.    We had him try mestinon but it did not help so he stopped.   He  has some cardiac issues so I will avoid fludrocortisone.   He wears compression stockings and had a vein ablation but not much benefit.      Occasional  HA pain is left > right most of the time but is greater on the right today..   Neck is sometimes stiff and there is upper neck pain/occipital pain.   Most of the time the headaches only last for an hour or so.   When he had nausea. Ondansetron had not helped.   Tylenol has not helped much.     CT head was normal for age.   CT cervical showed some DJD with moderate foraminal narrowing at C3C4 but no severe changes.    MRI of the brain 5/312023 was normal for age - mild atrophy and small vessel changes.  Nothing acute.    MRI of the cervical spine 08/30/2021 showed a normal spinal cord.  At [facility] he has mild spinal stenosis due to disc bulging and spondylosis.  Also foraminal narrowing.  A few small bulges elsewhere without sinal stenosis.    Bilateral carotid ultrasound showed less than 50% stenosis in the carotid arteries around the bulbs bilaterally.  The vertebral artery system had antegrade flow bilaterally.  Of note, BP was 188/97 at the time.  An x-ray of the cervical spine showed mild retrolisthesis of C3 upon C4  due to spondylosis.  There is foraminal narrowing at this level bilaterally.  Lesser foraminal narrowing is noted at C6-C7 due to degenerative changes.  His sister has PD.     TREMOR AND NEUROPATHY HISTORY: He has had progressively worsening tremor since 2016 on his left side only to begin with and after several years some tremor was noted on the right.  Initially, only the hand was involved and he noted worsening handwriting.   He noted the tremor in his leg a year or two later.    Tremor is worse if with intention and is a little worse if more tense.   He has noted some improvement with relaxing the left shoulder/back. He has been on metoprolol for a few years and did not note any improvement when he started.   Scotch helps some.        He has had numbness in his feet for several years and saw Dr. Lucia Gaskins once in 2019.  He has right > left leg edema.    He feels the numbness and foot/ankle/lower leg swelling have both worsened.   In 2019, SPEP/IEF, SSA/SSB, RF, thiamine, ESR were fine.   He is on metoprolol 25 mg po bid for his heart.   Primidone was tried June 2021.    REVIEW OF SYSTEMS: Constitutional: No fevers, chills, sweats, or change in appetite Eyes: No visual changes, double vision, eye pain Ear, nose and throat: No hearing loss, ear pain, nasal congestion, sore throat Cardiovascular: No chest pain, palpitations Respiratory:  No shortness of breath at rest or with exertion.   No wheezes GastrointestinaI: No nausea, vomiting, diarrhea, abdominal pain, fecal incontinence Genitourinary:  No dysuria, urinary retention or frequency.  No nocturia. Musculoskeletal:  No neck pain, back pain Integumentary: No rash, pruritus, skin lesions Neurological: as above Psychiatric: No depression at this time.  No anxiety Endocrine: No palpitations, diaphoresis, change in appetite, change in weigh or increased thirst Hematologic/Lymphatic:  No anemia, purpura, petechiae. Allergic/Immunologic: No itchy/runny eyes, nasal congestion, recent allergic reactions, rashes  ALLERGIES: No Known Allergies  HOME MEDICATIONS:  Current Outpatient Medications:    aspirin 81 MG EC tablet, 1 tablet, Disp: , Rfl:    carbidopa-levodopa (SINEMET IR) 25-100 MG tablet, TAKE 1 TABLET 4 TIMES DAILY, Disp: 360 tablet, Rfl: 1   carvedilol (COREG) 12.5 MG tablet, Take 1 tablet (12.5 mg total) by mouth 2 (two) times daily., Disp: 180 tablet, Rfl: 3   Cholecalciferol (VITAMIN D3) 50 MCG (2000 UT) capsule, Take 2,000 Units by mouth daily., Disp: , Rfl:    Coenzyme Q10 (CO Q 10) 100 MG CAPS, See admin instructions., Disp: , Rfl:    latanoprost (XALATAN) 0.005 % ophthalmic solution, Place 1 drop into the left eye at bedtime., Disp: , Rfl:    LORazepam  (ATIVAN) 0.5 MG tablet, One po every day prn, Disp: 30 tablet, Rfl: 5   Multiple Vitamin (MULTIVITAMIN) capsule, Take 1 capsule by mouth daily., Disp: , Rfl:    Omega-3 Fatty Acids (FISH OIL PO), Take 1,290 mg by mouth daily., Disp: , Rfl:    pantoprazole (PROTONIX) 40 MG tablet, 1 tablet, Disp: , Rfl:    pravastatin (PRAVACHOL) 20 MG tablet, TAKE 1 TABLET DAILY, Disp: 90 tablet, Rfl: 3   telmisartan-hydrochlorothiazide (MICARDIS HCT) 80-12.5 MG tablet, Take 1 tablet by mouth daily., Disp: 90 tablet, Rfl: 1  PAST MEDICAL HISTORY: Past Medical History:  Diagnosis Date   Arthritis    GERD (gastroesophageal reflux disease)    Glaucoma  Hypertension    Osteoarthritis of knee    Painful urination    Peripheral vascular disease (HCC)    bilateral edema     PAST SURGICAL HISTORY: Past Surgical History:  Procedure Laterality Date   CARDIAC CATHETERIZATION  04/2017   CATARACT EXTRACTION, BILATERAL Bilateral    Left: 10/30/21, right: 11/20/21.   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   TONSILLECTOMY     TOTAL KNEE ARTHROPLASTY Left 10/08/2016   Procedure: LEFT TOTAL KNEE ARTHROPLASTY;  Surgeon: Ollen Gross, MD;  Location: [facility];  Service: Orthopedics;  Laterality: Left;   TOTAL KNEE ARTHROPLASTY Right 06/17/2017   Procedure: RIGHT TOTAL KNEE ARTHROPLASTY;  Surgeon: Ollen Gross, MD;  Location: [facility];   Service: Orthopedics;  Laterality: Right;    FAMILY HISTORY: Family History  Problem Relation Age of Onset   Breast cancer Mother 65   Prostate cancer Father    Breast cancer Sister 44   Parkinson's disease Sister    Healthy Daughter    Neuropathy Neg Hx     SOCIAL HISTORY:  Social History   Socioeconomic History   Marital status: Married    Spouse name: Not on file   Number of children: 2   Years of education: Not on file   Highest education level: Bachelor's degree (e.g., BA, AB, BS)  Occupational History   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[employer]  Tobacco Use   Smoking status: Former    Current packs/day: 0.00    Average packs/day: 1 pack/day for 20.0 years (20.0 ttl pk-yrs)    Types: Cigarettes    Start date: 10/1967    Quit date: 10/1987    Years since quitting: 35.7   Smokeless tobacco: Never   Tobacco comments:    Quit 34 years ago  Vaping Use   Vaping status: Never Used  Substance and Sexual Activity   Alcohol use: Yes    Alcohol/week: 7.0 standard drinks of alcohol    Types: 7 Glasses of wine per week    Comment: daily, 2-3 glasses scotch per day   Drug use: No   Sexual activity: Yes  Other Topics Concern   Not on file  Social History Narrative   Lives at home with his wife   Left handed   Drinks coffee (2-3 cups per day)   Retired   Social Drivers of Corporate investment banker Strain: Low Risk  (09/07/2022)   Received from [facility], [facility]   Overall Financial Resource Strain (CARDIA)    Difficulty of Paying Living Expenses: Not hard at all  Food Insecurity: No Food Insecurity (09/07/2022)   Received from [facility], [facility]   Hunger Vital Sign    Worried About Running Out of Food in the Last Year: Never true    Ran Out of Food in the Last Year: Never true  Transportation Needs: No Transportation  Needs (09/07/2022)   Received from [facility], [facility]   [facility] - Transportation    Lack of  Transportation (Medical): No    Lack of Transportation (Non-Medical): No  Physical Activity: Sufficiently Active (09/07/2022)   Received from [facility], [facility]   Exercise Vital Sign    Days of Exercise per Week: 3 days    Minutes of Exercise per Session: 60 min  Stress: No Stress Concern Present (09/07/2022)   Received from [facility], [facility] - Occupational Stress Questionnaire    Feeling of Stress : Not at all  Social Connections: Somewhat Isolated (09/07/2022)   Received from [facility], [facility]   Social Network    How would you rate your social network (family, work, friends)?: Restricted participation with some degree of social isolation  Intimate Partner Violence: Not At Risk (09/07/2022)   Received from [facility], [facility]   HITS    Over the last 12 months how often did your partner physically hurt you?: Never    Over the last 12 months how often did your partner insult you or talk down to you?: Never    Over the last 12 months how often did your partner threaten you with physical harm?: Never    Over the last 12 months how often did your partner scream or curse at you?: Never     PHYSICAL EXAM  Vitals:   06/18/23 1056  BP: (!) 142/88  Resp: 16  Weight: 289 lb 8 oz (131.3 kg)  Height: 6' (1.829 m)    Body mass index is 39.26 kg/m.  BP recheck 142/90    General: The patient is well-developed and well-nourished and in no acute distress  HEENT:  Head is /AT.  Sclera are anicteric.  Neck has good range of motion.  Skin: Extremities are without rash or  edema.  Neurologic Exam  Mental status:  The patient is alert and oriented x 3 at the time of the examination. The patient has apparent normal recent and remote memory, with an apparently normal attention span and concentration ability.   Speech is normal.  Cranial nerves: Extraocular movements are full.  Facial strength and sensation was  normal.   No dysarthria is noted. No obvious hearing deficits are noted.  Motor: He does not have bradykinesia.  He has a 5 Hz tremor in the left hand that is worse with intention but present at rest.  There is milder tremor in the right arm.     Muscle bulk is normal.   Tone is very slightly increased in the left arm but with no cogwheeling.. Strength is  5 / 5 in all 4 extremities except 4+/5 EHL strength in the feet.   Sensory: Sensory testing is intact to pinprick, soft touch and vibration sensation in the arms but reduced pinprick and vibration in the foot.  Vibration sense fairy normal at ankle (10% great toe and 50% distal foot).  Coordination: Cerebellar testing reveals good finger-nose-finger and heel-to-shin bilaterally.  Gait and station: Station is normal.  Gait is normal for age. Good turn in 3 steps.   Mild reduced tandem but normal for age.  He has no retropulsion.   Romberg is negative.   Reflexes: Deep tendon reflexes are symmetric and normal bilaterally.     DIAGNOSTIC DATA (LABS, IMAGING, TESTING) - I reviewed patient records, labs, notes, testing and imaging myself where available.  Lab Results  Component Value Date  WBC 5.5 05/06/2023   HGB 14.5 05/06/2023   HCT 42.3 05/06/2023   MCV 98.8 05/06/2023   PLT 161 05/06/2023      Component Value Date/Time   NA 139 05/06/2023 0845   NA 140 10/31/2022 0953   K 4.0 05/06/2023 0845   CL 101 05/06/2023 0845   CO2 30 05/06/2023 0845   GLUCOSE 112 (H) 05/06/2023 0845   BUN 12 05/06/2023 0845   BUN 15 10/31/2022 0953   CREATININE 0.81 05/06/2023 0845   CALCIUM 9.1 05/06/2023 0845   PROT 6.8 10/31/2022 0953   ALBUMIN 4.3 10/31/2022 0953   AST 20 10/31/2022 0953   ALT 10 10/31/2022 0953   ALKPHOS 38 (L) 10/31/2022 0953   BILITOT 1.5 (H) 10/31/2022 0953   GFRNONAA >60 05/06/2023 0845   GFRAA 92 05/28/2019 1113   Lab Results  Component Value Date   CHOL 143 07/11/2020   HDL 51 07/11/2020   LDLCALC 78 07/11/2020    TRIG 73 07/11/2020   CHOLHDL 2.8 07/11/2020        ASSESSMENT AND PLAN  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 PD/ET:   He has not responded that well to medications for PD or BET    I recommend we obtain the DAT scan but he is claustophobic.  I do not think the a-synuclein seed amplification is commercially available yet.   We also discussed getting a second opinion with a movement disorder specialist to see if medication could be optimized.   He will continue 4 Sinemet pills a day.     2.   Headaches and mild numbness in the feet is stable. 3.   He continues to have orthostatic lightheadedness.  Mestinon have not helped.  Due to some cardiac issues we will hold off on fludrocortisone.   He will return in 6 months but call sooner if he has new or worsening symptoms.  This visit is part of a comprehensive longitudinal care medical relationship regarding the patients primary diagnosis of Parkinson's and related concerns.  Sage Hammill A. Epimenio Foot, MD, [facility] 06/18/2023, 2:24 PM Certified in Neurology, Clinical Neurophysiology, Sleep Medicine and Neuroimaging  [facility] [address] [place], [place] [address] [phone]

## 2023-06-25 DIAGNOSIS — H34812 Central retinal vein occlusion, left eye, with macular edema: Secondary | ICD-10-CM | POA: Diagnosis not present

## 2023-06-25 DIAGNOSIS — H401121 Primary open-angle glaucoma, left eye, mild stage: Secondary | ICD-10-CM | POA: Diagnosis not present

## 2023-06-28 DIAGNOSIS — R051 Acute cough: Secondary | ICD-10-CM | POA: Diagnosis not present

## 2023-07-02 DIAGNOSIS — I7121 Aneurysm of the ascending aorta, without rupture: Secondary | ICD-10-CM | POA: Diagnosis not present

## 2023-07-02 DIAGNOSIS — E782 Mixed hyperlipidemia: Secondary | ICD-10-CM | POA: Diagnosis not present

## 2023-07-02 DIAGNOSIS — I739 Peripheral vascular disease, unspecified: Secondary | ICD-10-CM | POA: Diagnosis not present

## 2023-07-02 DIAGNOSIS — I10 Essential (primary) hypertension: Secondary | ICD-10-CM | POA: Diagnosis not present

## 2023-07-03 LAB — BASIC METABOLIC PANEL WITH GFR
BUN/Creatinine Ratio: 19 (ref 10–24)
BUN: 18 mg/dL (ref 8–27)
CO2: 25 mmol/L (ref 20–29)
Calcium: 8.9 mg/dL (ref 8.6–10.2)
Chloride: 100 mmol/L (ref 96–106)
Creatinine, Ser: 0.97 mg/dL (ref 0.76–1.27)
Glucose: 100 mg/dL — ABNORMAL HIGH (ref 70–99)
Potassium: 4.1 mmol/L (ref 3.5–5.2)
Sodium: 140 mmol/L (ref 134–144)
eGFR: 81 mL/min/{1.73_m2} (ref >59)

## 2023-07-04 ENCOUNTER — Other Ambulatory Visit: Payer: Self-pay | Admitting: *Deleted

## 2023-07-04 DIAGNOSIS — I2510 Atherosclerotic heart disease of native coronary artery without angina pectoris: Secondary | ICD-10-CM

## 2023-07-04 DIAGNOSIS — I10 Essential (primary) hypertension: Secondary | ICD-10-CM

## 2023-07-04 DIAGNOSIS — E559 Vitamin D deficiency, unspecified: Secondary | ICD-10-CM

## 2023-07-04 DIAGNOSIS — E782 Mixed hyperlipidemia: Secondary | ICD-10-CM

## 2023-07-05 DIAGNOSIS — E559 Vitamin D deficiency, unspecified: Secondary | ICD-10-CM | POA: Diagnosis not present

## 2023-07-05 DIAGNOSIS — I2510 Atherosclerotic heart disease of native coronary artery without angina pectoris: Secondary | ICD-10-CM | POA: Diagnosis not present

## 2023-07-05 DIAGNOSIS — I10 Essential (primary) hypertension: Secondary | ICD-10-CM | POA: Diagnosis not present

## 2023-07-05 DIAGNOSIS — E782 Mixed hyperlipidemia: Secondary | ICD-10-CM | POA: Diagnosis not present

## 2023-07-06 LAB — LIPID PANEL
Chol/HDL Ratio: 2.7 ratio (ref 0.0–5.0)
Cholesterol, Total: 146 mg/dL (ref 100–199)
HDL: 55 mg/dL (ref >39)
LDL Chol Calc (NIH): 78 mg/dL (ref 0–99)
Triglycerides: 67 mg/dL (ref 0–149)
VLDL Cholesterol Cal: 13 mg/dL (ref 5–40)

## 2023-07-18 ENCOUNTER — Ambulatory Visit: Payer: Medicare Other | Admitting: Cardiology

## 2023-07-18 ENCOUNTER — Ambulatory Visit: Attending: Cardiology | Admitting: Cardiology

## 2023-07-18 ENCOUNTER — Encounter: Payer: Self-pay | Admitting: Cardiology

## 2023-07-18 VITALS — BP 138/92 | HR 73 | Ht 72.0 in | Wt 294.6 lb

## 2023-07-18 DIAGNOSIS — I2510 Atherosclerotic heart disease of native coronary artery without angina pectoris: Secondary | ICD-10-CM | POA: Insufficient documentation

## 2023-07-18 DIAGNOSIS — I491 Atrial premature depolarization: Secondary | ICD-10-CM | POA: Diagnosis not present

## 2023-07-18 DIAGNOSIS — E782 Mixed hyperlipidemia: Secondary | ICD-10-CM | POA: Diagnosis not present

## 2023-07-18 DIAGNOSIS — I7121 Aneurysm of the ascending aorta, without rupture: Secondary | ICD-10-CM | POA: Diagnosis not present

## 2023-07-18 MED ORDER — EZETIMIBE 10 MG PO TABS: 10.0000 mg | ORAL_TABLET | Freq: Every day | ORAL | 3 refills | Status: AC

## 2023-07-18 NOTE — Patient Instructions (Signed)
 Medication Instructions:  Your physician has recommended you make the following change in your medication:  START: Zetia 10 mg once daily *If you need a refill on your cardiac medications before your next appointment, please call your pharmacy*  Lab Work: IN 12 weeks: Lipids If you have labs (blood work) drawn today and your tests are completely normal, you will receive your results only by: MyChart Message (if you have MyChart) OR A paper copy in the mail If you have any lab test that is abnormal or we need to change your treatment, we will call you to review the results.  Follow-Up: At [facility], you and your health needs are our priority.  As part of our continuing mission to provide you with exceptional heart care, our providers are all part of one team.  This team includes your primary Cardiologist (physician) and Advanced Practice Providers or APPs (Physician Assistants and Nurse Practitioners) who all work together to provide you with the care you need, when you need it.  Your next appointment:   9 month(s)  Provider:   Kardie Tobb, DO     Other Instructions:    1st Floor: - Lobby - Registration  - Pharmacy  - Lab - Cafe  2nd Floor: - PV Lab - Diagnostic Testing (echo, CT, nuclear med)  3rd Floor: - Vacant  4th Floor: - TCTS (cardiothoracic surgery) - AFib Clinic - Structural Heart Clinic - Vascular Surgery  - Vascular Ultrasound  5th Floor: - HeartCare Cardiology (general and EP) - Clinical Pharmacy for coumadin, hypertension, lipid, weight-loss medications, and med management appointments    Valet parking services will be available as well.

## 2023-07-18 NOTE — Progress Notes (Signed)
 Cardiology Office Note:    Date:  07/18/2023   ID:  Todd Chan, DOB 04/17/1945, MRN [id number]  PCP:  Koren Shiver, DO (Inactive)  Cardiologist:  Thomasene Ripple, DO  Electrophysiologist:  None   Referring MD: Koren Shiver, DO   'I am ok"  History of Present Illness:    Todd Chan is a 78 y.o. male with a hx of mild CAD with 50% proximal LAD stenosis and normal left ventricular function at coronary angiography in 2019 hypertension mild enlargement ascending aorta stable at 41 mm 06/28/2020 last seen 07/11/2020.   He is here today for his wife. At his last visit with me we increased his carvedilol to 12.5 mg twice daily.  Since I saw the patient he had seen Rise Paganini, NP on June 14, 2023 at that time his blood pressure was elevated.  And declined to have any medication changes.  During the visit his lipid profile was also drawn.  LDL still elevated.   Past Medical History:  Diagnosis Date   Arthritis    GERD (gastroesophageal reflux disease)    Glaucoma    Hypertension    Osteoarthritis of knee    Painful urination    Peripheral vascular disease (HCC)    bilateral edema     Past Surgical History:  Procedure Laterality Date   CARDIAC CATHETERIZATION  04/2017   CATARACT EXTRACTION, BILATERAL Bilateral    Left: 10/30/21, right: 11/20/21.   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   KNEE ARTHROSCOPY Left 6-7 years ago   LEFT HEART CATH AND CORONARY ANGIOGRAPHY N/A 04/17/2017   Procedure: LEFT HEART CATH AND CORONARY ANGIOGRAPHY;  Surgeon: Lennette Bihari, MD;  Location: [facility];  Service: Cardiovascular;  Laterality: N/A;   RADIOLOGY WITH ANESTHESIA N/A 08/30/2020   Procedure: MRI WITH ANESTHESIA BRAIN WITH AND WITHOUT AND MRI CERVICAL SPINE WITH AND WITHOUT;  Surgeon: Radiologist, Medication, MD;  Location: [facility] OR;  Service: Radiology;  Laterality: N/A;   REPLACEMENT TOTAL KNEE Left  10/08/2016   TONSILLECTOMY     TOTAL KNEE ARTHROPLASTY Left 10/08/2016   Procedure: LEFT TOTAL KNEE ARTHROPLASTY;  Surgeon: Ollen Gross, MD;  Location: [facility];  Service: Orthopedics;  Laterality: Left;   TOTAL KNEE ARTHROPLASTY Right 06/17/2017   Procedure: RIGHT TOTAL KNEE ARTHROPLASTY;  Surgeon: Ollen Gross, MD;  Location: [facility];  Service: Orthopedics;  Laterality: Right;    Current Medications: Current Meds  Medication Sig   aspirin 81 MG EC tablet 1 tablet   budesonide-formoterol (SYMBICORT) 80-4.5 MCG/ACT inhaler Inhale 2 puffs into the lungs daily.   carbidopa-levodopa (SINEMET IR) 25-100 MG tablet TAKE 1 TABLET 4 TIMES DAILY   carvedilol (COREG) 12.5 MG tablet Take 1 tablet (12.5 mg total) by mouth 2 (two) times daily.   Cholecalciferol (VITAMIN D3) 50 MCG (2000 UT) capsule Take 2,000 Units by mouth daily.   Coenzyme Q10 (CO Q 10) 100 MG CAPS See admin instructions.   ezetimibe (ZETIA) 10 MG tablet Take 1 tablet (10 mg total) by mouth daily.   latanoprost (XALATAN) 0.005 % ophthalmic solution Place 1 drop into the left eye at bedtime.   LORazepam (ATIVAN) 0.5 MG tablet One po every day prn   Multiple Vitamin (MULTIVITAMIN) capsule Take 1 capsule by mouth daily.   Omega-3 Fatty Acids (FISH OIL PO) Take 1,290 mg by mouth daily.   pantoprazole (PROTONIX) 40 MG tablet 1 tablet  pravastatin (PRAVACHOL) 20 MG tablet TAKE 1 TABLET DAILY   telmisartan-hydrochlorothiazide (MICARDIS HCT) 80-12.5 MG tablet Take 1 tablet by mouth daily.     Allergies:   Patient has no known allergies.   Social History   Socioeconomic History   Marital status: Married    Spouse name: Not on file   Number of children: 2   Years of education: Not on file   Highest education level: Bachelor's degree (e.g., BA, AB, BS)  Occupational History   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[employer]  Tobacco Use   Smoking status: Former    Current packs/day: 0.00    Average packs/day: 1  pack/day for 20.0 years (20.0 ttl pk-yrs)    Types: Cigarettes    Start date: 10/1967    Quit date: 10/1987    Years since quitting: 35.8   Smokeless tobacco: Never   Tobacco comments:    Quit 34 years ago  Vaping Use   Vaping status: Never Used  Substance and Sexual Activity   Alcohol use: Yes    Alcohol/week: 7.0 standard drinks of alcohol    Types: 7 Glasses of wine per week    Comment: daily, 2-3 glasses scotch per day   Drug use: No   Sexual activity: Yes  Other Topics Concern   Not on file  Social History Narrative   Lives at home with his wife   Left handed   Drinks coffee (2-3 cups per day)   Retired   Social Drivers of Corporate investment banker Strain: Low Risk  (09/07/2022)   Received from [facility], [facility]   Overall Financial Resource Strain (CARDIA)    Difficulty of Paying Living Expenses: Not hard at all  Food Insecurity: No Food Insecurity (09/07/2022)   Received from [facility], [facility]   Hunger Vital Sign    Worried About Running Out of Food in the Last Year: Never true    Ran Out of Food in the Last Year: Never true  Transportation Needs: No Transportation Needs (09/07/2022)   Received from [facility], [facility]   PRAPARE - Transportation    Lack of Transportation (Medical): No    Lack of Transportation (Non-Medical): No  Physical Activity: Sufficiently Active (09/07/2022)   Received from [facility], [facility]   Exercise Vital Sign    Days of Exercise per Week: 3 days    Minutes of Exercise per Session: 60 min  Stress: No Stress Concern Present (09/07/2022)   Received from [facility], [facility] - Occupational Stress Questionnaire    Feeling of Stress : Not at all  Social Connections: Somewhat Isolated (09/07/2022)   [facility] from [facility], [facility]   Social Network    How would you rate your social network (family, work, friends)?: Restricted participation with  some degree of social isolation     Family History: The patient's family history includes Breast cancer (age of onset: 37) in his sister; Breast cancer (age of onset: 52) in his mother; Healthy in his daughter; Parkinson's disease in his sister; Prostate cancer in his father. There is no history of Neuropathy.  ROS:   Review of Systems  Constitution: Negative for decreased appetite, fever and weight gain.  HENT: Negative for congestion, ear discharge, hoarse voice and sore throat.   Eyes: Negative for discharge, redness, vision loss in right eye and visual halos.  Cardiovascular: Negative for chest pain, dyspnea  on exertion, leg swelling, orthopnea and palpitations.  Respiratory: Negative for cough, hemoptysis, shortness of breath and snoring.   Endocrine: Negative for heat intolerance and polyphagia.  Hematologic/Lymphatic: Negative for bleeding problem. Does not bruise/bleed easily.  Skin: Negative for flushing, nail changes, rash and suspicious lesions.  Musculoskeletal: Negative for arthritis, joint pain, muscle cramps, myalgias, neck pain and stiffness.  Gastrointestinal: Negative for abdominal pain, bowel incontinence, diarrhea and excessive appetite.  Genitourinary: Negative for decreased libido, genital sores and incomplete emptying.  Neurological: Negative for brief paralysis, focal weakness, headaches and loss of balance.  Psychiatric/Behavioral: Negative for altered mental status, depression and suicidal ideas.  Allergic/Immunologic: Negative for HIV exposure and persistent infections.    EKGs/Labs/Other Studies Reviewed:    The following studies were reviewed today:   EKG:  The ekg ordered today demonstrates sinus rhythm  CT chest 05/2020 IMPRESSION: 1. Stable appearance of ascending thoracic aorta with mild aneurysmal dilation. Recommend annual imaging followup by CTA or MRA. This recommendation follows 2010 ACCF/AHA/AATS/ACR/ASA/SCA/SCAI/SIR/STS/SVM Guidelines for  the Diagnosis and Management of Patients with Thoracic Aortic Disease. Circulation. 2010; 121: Z610-R604. Aortic aneurysm NOS (ICD10-I71.9) 2. Signs of coronary artery disease as before. 3. Cholelithiasis. 4. Lobular hepatic contours, correlate with any clinical or laboratory evidence of liver disease. 5. Aortic atherosclerosis.   Aortic Atherosclerosis (ICD10-I70.0).     Electronically Signed   By: Zara Heymann M.D.   On: 06/28/2020 15:32  LHC 04/2017 Ost LAD to Prox LAD lesion is 15% stenosed. Prox LAD lesion is 10% stenosed. The left ventricular systolic function is normal. LV end diastolic pressure is normal. The left ventricular ejection fraction is 55-65% by visual estimate.   Normal LV function without focal wall motion abnormalities.  EF 55-60%.   No significant coronary obstructive disease, although there is a small area of calcification superior to the ostium of the a short left main, 10-15% luminal irregularity of the proximal LAD and a normal ramus intermediate, left circumflex, and dominant RCA.   RECOMMENDATION: Medical therapy.  Suspect attenuation artifact contributing to the patient's abnormal nuclear stress test.  The patient will return to Dr. Zoe Hinds and will be given preoperative cardiac clearance.    Recent Labs: 10/31/2022: ALT 10; TSH 1.190 05/06/2023: Hemoglobin 14.5; Magnesium 2.1; Platelets 161 07/02/2023: BUN 18; Creatinine, Ser 0.97; Potassium 4.1; Sodium 140  Recent Lipid Panel    Component Value Date/Time   CHOL 146 07/05/2023 0807   TRIG 67 07/05/2023 0807   HDL 55 07/05/2023 0807   CHOLHDL 2.7 07/05/2023 0807   LDLCALC 78 07/05/2023 0807    Physical Exam:    VS:  BP (!) 138/92 (BP Location: Right Arm, Patient Position: Sitting, Cuff Size: Normal)   Pulse 73   Ht 6' (1.829 m)   Wt 294 lb 9.6 oz (133.6 kg)   SpO2 97%   BMI 39.95 kg/m     Wt Readings from Last 3 Encounters:  07/18/23 294 lb 9.6 oz (133.6 kg)  06/18/23 289 lb 8 oz  (131.3 kg)  06/14/23 291 lb 12.8 oz (132.4 kg)     GEN: Well nourished, well developed in no acute distress HEENT: Normal NECK: No JVD; No carotid bruits LYMPHATICS: No lymphadenopathy CARDIAC: S1S2 noted,RRR, no murmurs, rubs, gallops RESPIRATORY:  Clear to auscultation without rales, wheezing or rhonchi  ABDOMEN: Soft, non-tender, non-distended, +bowel sounds, no guarding. EXTREMITIES: No edema, No cyanosis, no clubbing MUSCULOSKELETAL:  No deformity  SKIN: Warm and dry NEUROLOGIC:  Alert and oriented x 3, non-focal  PSYCHIATRIC:  Normal affect, good insight  ASSESSMENT:    1. Mixed hyperlipidemia   2. Coronary artery disease involving native coronary artery of native heart without angina pectoris   3. Aneurysm of ascending aorta without rupture (HCC)   4. PAC (premature atrial contraction)   5. Morbid obesity (HCC)     PLAN:    CAD-LDL 78 this is now less than 70 ideally goal is to be less than 55.  I am going to add Zetia 10 mg daily to his regimen.  He will be on Pravachol 20 mg as well as Zetia 10 g daily.  PACs-he still experiences intermittent symptoms but he is not concerned about this as it comes and goes.  Hypertension-blood pressure slightly elevated but he prefers not to have any medication increase at this time.  Will continue to monitor and address as appropriate.  The patient understands the need to lose weight with diet and exercise. We have discussed specific strategies for this.  The patient is in agreement with the above plan. The patient left the office in stable condition.  The patient will follow up in   Medication Adjustments/L abs and Tests Ordered: Current medicines are reviewed at length with the patient today.  Concerns regarding medicines are outlined above.  Orders Placed This Encounter  Procedures   Lipid panel   Meds ordered this encounter  Medications   ezetimibe (ZETIA) 10 MG tablet    Sig: Take 1 tablet (10 mg total) by mouth daily.     Dispense:  90 tablet    Refill:  3    Patient Instructions  Medication Instructions:  Your physician has recommended you make the following change in your medication:  START: Zetia 10 mg once daily *If you need a refill on your cardiac medications before your next appointment, please call your pharmacy*  Lab Work: IN 12 weeks: Lipids If you have labs (blood work) drawn today and your tests are completely normal, you will receive your results only by: MyChart Message (if you have MyChart) OR A paper copy in the mail If you have any lab test that is abnormal or we need to change your treatment, we will call you to review the results.  Follow-Up: At [facility], you and your health needs are our priority.  As part of our continuing mission to provide you with exceptional heart care, our providers are all part of one team.  This team includes your primary Cardiologist (physician) and Advanced Practice Providers or APPs (Physician Assistants and Nurse Practitioners) who all work together to provide you with the care you need, when you need it.  Your next appointment:   9 month(s)  Provider:   Gillie Fleites, DO     Other Instructions:    1st Floor: - Lobby - Registration  - Pharmacy  - Lab - Cafe  2nd Floor: - PV Lab - Diagnostic Testing (echo, CT, nuclear med)  3rd Floor: - Vacant  4th Floor: - TCTS (cardiothoracic surgery) - AFib Clinic - Structural Heart Clinic - Vascular Surgery  - Vascular Ultrasound  5th Floor: - HeartCare Cardiology (general and EP) - Clinical Pharmacy for coumadin, hypertension, lipid, weight-loss medications, and med management appointments    Valet parking services will be available as well.      Adopting a Healthy Lifestyle.  Know what a healthy weight is for you (roughly BMI <25) and aim to maintain this   Aim for 7+ servings of fruits and vegetables daily  65-80+ fluid ounces of water or unsweet tea for healthy  kidneys   Limit to max 1 drink of alcohol per day; avoid smoking/tobacco   Limit animal fats in diet for cholesterol and heart health - choose grass fed whenever available   Avoid highly processed foods, and foods high in saturated/trans fats   Aim for low stress - take time to unwind and care for your mental health   Aim for 150 min of moderate intensity exercise weekly for heart health, and weights twice weekly for bone health   Aim for 7-9 hours of sleep daily   When it comes to diets, agreement about the perfect plan isnt easy to find, even among the experts. Experts at the [facility] [employer] developed an idea known as the Healthy Eating Plate. Just imagine a plate divided into logical, healthy portions.   The emphasis is on diet quality:   Load up on vegetables and fruits - one-half of your plate: Aim for color and variety, and remember that potatoes dont count.   Go for whole grains - one-quarter of your plate: Whole wheat, barley, wheat berries, quinoa, oats, brown rice, and foods made with them. If you want pasta, go with whole wheat pasta.   Protein power - one-quarter of your plate: Fish, chicken, beans, and nuts are all healthy, versatile protein sources. Limit red meat.   The diet, however, does go beyond the plate, offering a few other suggestions.   Use healthy plant oils, such as olive, canola, soy, corn, sunflower and peanut. Check the labels, and avoid partially hydrogenated oil, which have unhealthy trans fats.   If youre thirsty, drink water. Coffee and tea are good in moderation, but skip sugary drinks and limit milk and dairy products to one or two daily servings.   The type of carbohydrate in the diet is more important than the amount. Some sources of carbohydrates, such as vegetables, fruits, whole grains, and beans-are healthier than others.   Finally, stay active  Signed, Jerryl Morin, DO  07/18/2023 10:53 AM    [facility]  [facility]

## 2023-07-25 DIAGNOSIS — R600 Localized edema: Secondary | ICD-10-CM | POA: Diagnosis not present

## 2023-08-01 ENCOUNTER — Encounter: Payer: Self-pay | Admitting: Podiatry

## 2023-08-01 ENCOUNTER — Ambulatory Visit (INDEPENDENT_AMBULATORY_CARE_PROVIDER_SITE_OTHER): Payer: Medicare Other | Admitting: Podiatry

## 2023-08-01 DIAGNOSIS — M79675 Pain in left toe(s): Secondary | ICD-10-CM | POA: Diagnosis not present

## 2023-08-01 DIAGNOSIS — I739 Peripheral vascular disease, unspecified: Secondary | ICD-10-CM

## 2023-08-01 DIAGNOSIS — M79674 Pain in right toe(s): Secondary | ICD-10-CM | POA: Diagnosis not present

## 2023-08-01 DIAGNOSIS — G609 Hereditary and idiopathic neuropathy, unspecified: Secondary | ICD-10-CM

## 2023-08-01 DIAGNOSIS — B351 Tinea unguium: Secondary | ICD-10-CM | POA: Diagnosis not present

## 2023-08-01 NOTE — Progress Notes (Signed)
  Subjective:  Patient ID: Todd Chan, male    DOB: March 12, 1946,   MRN: [id number]  Chief Complaint  Patient presents with   Nail Problem    RFC    78 y.o. male presents for concern of painful thickened and elongated toenails with a history of PVD and neuropathy. Denies diabetes. Hoping to have nails trimmed   . Denies any other pedal complaints. Denies n/v/f/c.   PCP: Dallas Due DO   Past Medical History:  Diagnosis Date   Arthritis    GERD (gastroesophageal reflux disease)    Glaucoma    Hypertension    Osteoarthritis of knee    Painful urination    Peripheral vascular disease (HCC)    bilateral edema     Objective:  Physical Exam: Vascular: DP/PT pulses 1/4 bilateral. CFT <3 seconds. Normal hair growth on digits. No edema.  Skin. No lacerations or abrasions bilateral feet. Nails 1-5 are thickened discolored and elongated with subungual debris.  Musculoskeletal: MMT 5/5 bilateral lower extremities in DF, PF, Inversion and Eversion. Deceased ROM in DF of ankle joint. No pain to palpation.  Neurological: Sensation diminished to light touch. Protective sensation present to all 10 locations with SWMF.   Assessment:   1. Pain due to onychomycosis of toenails of both feet   2. Peripheral vascular disease (HCC)   3. Idiopathic peripheral neuropathy          Plan:  Patient was evaluated and treated and all questions answered. Discussed neuropathy and etiology as well as treatment with patient.  -Discussed and educated patient on foot care, especially with  regards to the vascular, neurological and musculoskeletal systems.  -Discussed supportive shoes at all times and checking feet regularly.  -Nails 1-5 b/l debrided today without incident.  -Patient to return in 3 months for at risk foot care.    Jennefer Moats, DPM

## 2023-08-05 ENCOUNTER — Other Ambulatory Visit: Payer: Self-pay | Admitting: Neurology

## 2023-08-05 NOTE — Telephone Encounter (Signed)
 Last seen on 06/18/23 Follow up scheduled on 06/23/24

## 2023-08-08 DIAGNOSIS — Z6841 Body Mass Index (BMI) 40.0 and over, adult: Secondary | ICD-10-CM | POA: Diagnosis not present

## 2023-08-08 DIAGNOSIS — J069 Acute upper respiratory infection, unspecified: Secondary | ICD-10-CM | POA: Diagnosis not present

## 2023-08-09 ENCOUNTER — Telehealth: Payer: Self-pay | Admitting: Pulmonary Disease

## 2023-08-09 NOTE — Telephone Encounter (Signed)
 Please schedule patient for consult for shortness of breath with me May 13-16 in a blocked slot if nothing available.   Thank you, JD

## 2023-08-12 NOTE — Telephone Encounter (Signed)
Patient scheduled for tomorrow at 1130

## 2023-08-13 ENCOUNTER — Ambulatory Visit: Admitting: Pulmonary Disease

## 2023-08-13 NOTE — Progress Notes (Deleted)
 Synopsis: Referred in My 2025 for Dyspnea and wheezing  Subjective:   PATIENT ID: Todd Chan GENDER: male DOB: 1945/08/27, MRN: [id number]   HPI  No chief complaint on file.  Todd Chan is a 78 year old male, former smoker with history of Parkinson's disease, GERD, hypertension, glaucoma, coronary artery disease and peripheral vascular disease who is referred to pulmonary clinic for shortness of breath and wheezing.     PFTs 2020 were within normal limits.   Past Medical History:  Diagnosis Date   Arthritis    GERD (gastroesophageal reflux disease)    Glaucoma    Hypertension    Osteoarthritis of knee    Painful urination    Peripheral vascular disease (HCC)    bilateral edema      Family History  Problem Relation Age of Onset   Breast cancer Mother 71   Prostate cancer Father    Breast cancer Sister 48   Parkinson's disease Sister    Healthy Daughter    Neuropathy Neg Hx      Social History   Socioeconomic History   Marital status: Married    Spouse name: Not on file   Number of children: 2   Years of education: Not on file   Highest education level: Bachelor's degree (e.g., BA, AB, BS)  Occupational History   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[employer]  Tobacco Use   Smoking status: Former    Current packs/day: 0.00    Average packs/day: 1 pack/day for 20.0 years (20.0 ttl pk-yrs)    Types: Cigarettes    Start date: 10/1967    Quit date: 10/1987    Years since quitting: 35.8   Smokeless tobacco: Never   Tobacco comments:    Quit 34 years ago  Vaping Use   Vaping status: Never Used  Substance and Sexual Activity   Alcohol use: Yes    Alcohol/week: 7.0 standard drinks of alcohol    Types: 7 Glasses of wine per week    Comment: daily, 2-3 glasses scotch per day   Drug use: No   Sexual activity: Yes  Other Topics Concern   Not on file  Social History Narrative   Lives at home with his wife   Left handed   Drinks  coffee (2-3 cups per day)   Retired   Social Drivers of Corporate investment banker Strain: Low Risk  (09/07/2022)   Received from [employer] [employer], [facility]   Overall Financial Resource Strain (CARDIA)    Difficulty of Paying Living Expenses: Not hard at all  Food Insecurity: No Food Insecurity (09/07/2022)   Received from [facility], [facility]   Hunger Vital Sign    Worried About Running Out of Food in the Last Year: Never true    Ran Out of Food in the Last Year: Never true  Transportation Needs: No Transportation Needs (09/07/2022)   Received from [facility], [facility]   PRAPARE - Transportation    Lack of Transportation (Medical): No    Lack of Transportation (Non-Medical): No  Physical Activity: Sufficiently Active (09/07/2022)   Received from [facility], [facility]   Exercise Vital Sign    Days of Exercise per Week: 3 days    Minutes of Exercise per Session: 60 min  Stress: No Stress Concern Present (09/07/2022)   Received from [facility], [facility] of Occupational Health - Occupational Stress Questionnaire    Feeling  of Stress : Not at all  Social Connections: Somewhat Isolated (09/07/2022)   Received from [facility], [facility]   Social Network    How would you rate your social network (family, work, friends)?: Restricted participation with some degree of social isolation  Intimate Partner Violence: Not At Risk (09/07/2022)   Received from [facility], [facility]   HITS    Over the last 12 months how often did your partner physically hurt you?: Never    Over the last 12 months how often did your partner insult you or talk down to you?: Never    Over the last 12 months how often did your partner threaten you with physical harm?: Never    Over the last 12 months how often did your partner scream or curse at you?: Never     No Known Allergies   Outpatient Medications Prior to Visit  Medication Sig Dispense Refill    aspirin  81 MG EC tablet 1 tablet     budesonide-formoterol (SYMBICORT) 80-4.5 MCG/ACT inhaler Inhale 2 puffs into the lungs daily.     carbidopa -levodopa  (SINEMET  IR) 25-100 MG tablet TAKE 1 TABLET 4 TIMES DAILY 360 tablet 1   carvedilol  (COREG ) 12.5 MG tablet Take 1 tablet (12.5 mg total) by mouth 2 (two) times daily. 180 tablet 3   Cholecalciferol (VITAMIN D3) 50 MCG (2000 UT) capsule Take 2,000 Units by mouth daily.     Coenzyme Q10 (CO Q 10) 100 MG CAPS See admin instructions.     ezetimibe  (ZETIA ) 10 MG tablet Take 1 tablet (10 mg total) by mouth daily. 90 tablet 3   latanoprost  (XALATAN ) 0.005 % ophthalmic solution Place 1 drop into the left eye at bedtime.     LORazepam  (ATIVAN ) 0.5 MG tablet One po every day prn 30 tablet 5   Multiple Vitamin (MULTIVITAMIN) capsule Take 1 capsule by mouth daily.     Omega-3 Fatty Acids (FISH OIL PO) Take 1,290 mg by mouth daily.     pantoprazole  (PROTONIX ) 40 MG tablet 1 tablet     pravastatin  (PRAVACHOL ) 20 MG tablet TAKE 1 TABLET DAILY 90 tablet 3   telmisartan -hydrochlorothiazide (MICARDIS  HCT) 80-12.5 MG tablet Take 1 tablet by mouth daily. 90 tablet 1   No facility-administered medications prior to visit.    ROS    Objective:  There were no vitals filed for this visit.   Physical Exam    CBC    Component Value Date/Time   WBC 5.5 05/06/2023 0845   RBC 4.28 05/06/2023 0845   HGB 14.5 05/06/2023 0845   HGB 15.3 04/11/2017 1032   HCT 42.3 05/06/2023 0845   HCT 44.9 04/11/2017 1032   PLT 161 05/06/2023 0845   PLT 228 04/11/2017 1032   MCV 98.8 05/06/2023 0845   MCV 97 04/11/2017 1032   MCH 33.9 05/06/2023 0845   MCHC 34.3 05/06/2023 0845   RDW 13.0 05/06/2023 0845   RDW 14.5 04/11/2017 1032   LYMPHSABS 2.0 05/15/2021 1625   LYMPHSABS 1.3 04/11/2017 1032   MONOABS 0.7 05/15/2021 1625   EOSABS 0.0 05/15/2021 1625   EOSABS 0.1 04/11/2017 1032   BASOSABS 0.1 05/15/2021 1625   BASOSABS 0.0 04/11/2017 1032      Latest Ref  Rng & Units 07/02/2023    9:18 AM 05/06/2023    8:45 AM 10/31/2022    9:53 AM  BMP  Glucose 70 - 99 mg/dL 098  119  147   BUN 8 - 27 mg/dL 18  12  15   Creatinine 0.76 - 1.27 mg/dL 1.61  0.96  0.45   BUN/Creat Ratio 10 - 24 19   16    Sodium 134 - 144 mmol/L 140  139  140   Potassium 3.5 - 5.2 mmol/L 4.1  4.0  4.5   Chloride 96 - 106 mmol/L 100  101  100   CO2 20 - 29 mmol/L 25  30  29    Calcium 8.6 - 10.2 mg/dL 8.9  9.1  9.9    Chest imaging: CXR 05/06/23 Low volume film. Similar chronic subsegmental atelectasis or scarring at the lung bases. No pulmonary edema or focal airspace consolidation. No substantial pleural effusion. Cardiopericardial silhouette is at upper limits of normal for size. No acute bony abnormality.  CTA Chest 02/15/23 Mediastinum/Nodes: Patent trachea. No thyroid  mass. Mildly enlarged prevascular lymph node measuring 12 mm, no change. Esophagus within normal limits.   Lungs/Pleura: No acute airspace disease, pleural effusion, or pneumothorax  PFT:    Latest Ref Rng & Units 06/16/2018   10:43 AM  PFT Results  FVC-Pre L 3.71  P  FVC-Predicted Pre % 78  P  FVC-Post L 3.79  P  FVC-Predicted Post % 80  P  Pre FEV1/FVC % % 77  P  Post FEV1/FCV % % 80  P  FEV1-Pre L 2.85  P  FEV1-Predicted Pre % 82  P  FEV1-Post L 3.04  P  DLCO uncorrected ml/min/mmHg 29.46  P  DLCO UNC% % 107  P  DLVA Predicted % 129  P  TLC L 6.87  P  TLC % Predicted % 92  P  RV % Predicted % 123  P    P Preliminary result    Labs:  Path:  Echo:  Heart Catheterization:       Assessment & Plan:   No diagnosis found.  Discussion: ***    Current Outpatient Medications:    aspirin  81 MG EC tablet, 1 tablet, Disp: , Rfl:    budesonide-formoterol (SYMBICORT) 80-4.5 MCG/ACT inhaler, Inhale 2 puffs into the lungs daily., Disp: , Rfl:    carbidopa -levodopa  (SINEMET  IR) 25-100 MG tablet, TAKE 1 TABLET 4 TIMES DAILY, Disp: 360 tablet, Rfl: 1   carvedilol  (COREG ) 12.5 MG  tablet, Take 1 tablet (12.5 mg total) by mouth 2 (two) times daily., Disp: 180 tablet, Rfl: 3   Cholecalciferol (VITAMIN D3) 50 MCG (2000 UT) capsule, Take 2,000 Units by mouth daily., Disp: , Rfl:    Coenzyme Q10 (CO Q 10) 100 MG CAPS, See admin instructions., Disp: , Rfl:    ezetimibe  (ZETIA ) 10 MG tablet, Take 1 tablet (10 mg total) by mouth daily., Disp: 90 tablet, Rfl: 3   latanoprost  (XALATAN ) 0.005 % ophthalmic solution, Place 1 drop into the left eye at bedtime., Disp: , Rfl:    LORazepam  (ATIVAN ) 0.5 MG tablet, One po every day prn, Disp: 30 tablet, Rfl: 5   Multiple Vitamin (MULTIVITAMIN) capsule, Take 1 capsule by mouth daily., Disp: , Rfl:    Omega-3 Fatty Acids (FISH OIL PO), Take 1,290 mg by mouth daily., Disp: , Rfl:    pantoprazole  (PROTONIX ) 40 MG tablet, 1 tablet, Disp: , Rfl:    pravastatin  (PRAVACHOL ) 20 MG tablet, TAKE 1 TABLET DAILY, Disp: 90 tablet, Rfl: 3   telmisartan -hydrochlorothiazide (MICARDIS  HCT) 80-12.5 MG tablet, Take 1 tablet by mouth daily., Disp: 90 tablet, Rfl: 1

## 2023-08-14 ENCOUNTER — Ambulatory Visit: Admitting: Pulmonary Disease

## 2023-08-14 ENCOUNTER — Encounter: Payer: Self-pay | Admitting: Pulmonary Disease

## 2023-08-14 VITALS — BP 134/78 | HR 71 | Ht 72.0 in | Wt 300.0 lb

## 2023-08-14 DIAGNOSIS — R0982 Postnasal drip: Secondary | ICD-10-CM | POA: Diagnosis not present

## 2023-08-14 DIAGNOSIS — J4520 Mild intermittent asthma, uncomplicated: Secondary | ICD-10-CM | POA: Diagnosis not present

## 2023-08-14 DIAGNOSIS — K219 Gastro-esophageal reflux disease without esophagitis: Secondary | ICD-10-CM | POA: Diagnosis not present

## 2023-08-14 MED ORDER — FLUTICASONE PROPIONATE 50 MCG/ACT NA SUSP
1.0000 | Freq: Every day | NASAL | 2 refills | Status: AC
Start: 2023-08-14 — End: ?

## 2023-08-14 MED ORDER — IPRATROPIUM BROMIDE 0.03 % NA SOLN: 2.0000 | Freq: Two times a day (BID) | NASAL | 12 refills | Status: AC

## 2023-08-14 NOTE — Patient Instructions (Addendum)
 Start flonase nasal spray, 1 spray per nostril daily  Start ipratropium nasal spray, 2 sprays per nostril twice daily - use    Continue symbicort inhaler 2 puffs twice daily - rinse mouth out after each use  Use albuterol inhaler 1-2 puffs every 4-6 hours as needed  Continue pantoprazole  40mg  daily at bedtime. Monitor your reflux symptoms in relation to your cough and wheezing  Follow up in 4 months, call sooner if needed

## 2023-08-14 NOTE — Progress Notes (Signed)
 Synopsis: Referred in My 2025 for Dyspnea and wheezing  Subjective:   PATIENT ID: Todd Chan GENDER: male DOB: Aug 03, 1945, MRN: [id number]   HPI  Chief Complaint  Patient presents with   Consult    Wheezing and cough over past few months   Todd Chan is a 78 year old male, former smoker with history of Parkinson's disease, GERD, hypertension, glaucoma, coronary artery disease and peripheral vascular disease who is referred to pulmonary clinic for shortness of breath and wheezing.   He has a persistent cough for over eight months, severe at night, sometimes leading to near vomiting. The cough is associated with postnasal drip, especially in the mornings, and worsens with seasonal changes, particularly in fall and spring. It also exacerbates after eating or during the night. He produces clear phlegm in the morning.  He experiences wheezing, which has decreased since starting Symbicort, taken as two puffs in the morning and evening. Albuterol is used as needed, but less frequently since starting Symbicort. He recently completed a course of doxycycline.  He has heartburn and frequent belching, managed with pantoprazole  at bedtime, previously taken twice daily.   Nasal drainage is present, contributing to his cough. He has used Flonase  in the past but not recently. An air purifier in the bedroom is used, which he believes reduces wheezing. No specific allergies are diagnosed, but he suspects pollen and environmental factors may contribute to symptoms.   PFTs 2020 were within normal limits. He quit smoking in 1989.  Past Medical History:  Diagnosis Date   Arthritis    GERD (gastroesophageal reflux disease)    Glaucoma    Hypertension    Osteoarthritis of knee    Painful urination    Peripheral vascular disease (HCC)    bilateral edema      Family History  Problem Relation Age of Onset   Breast cancer Mother 30   Prostate cancer Father    Breast cancer Sister 39    Parkinson's disease Sister    Healthy Daughter    Neuropathy Neg Hx      Social History   Socioeconomic History   Marital status: Married    Spouse name: Not on file   Number of children: 2   Years of education: Not on file   Highest education level: Bachelor's degree (e.g., BA, AB, BS)  Occupational History   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[employer]  Tobacco Use   Smoking status: Former    Current packs/day: 0.00    Average packs/day: 1 pack/day for 20.0 years (20.0 ttl pk-yrs)    Types: Cigarettes    Start date: 10/1967    Quit date: 10/1987    Years since quitting: 35.9   Smokeless tobacco: Never   Tobacco comments:    Quit 34 years ago  Vaping Use   Vaping status: Never Used  Substance and Sexual Activity   Alcohol use: Yes    Alcohol/week: 7.0 standard drinks of alcohol    Types: 7 Glasses of wine per week    Comment: daily, 2-3 glasses scotch per day   Drug use: No   Sexual activity: Yes  Other Topics Concern   Not on file  Social History Narrative   Lives at home with his wife   Left handed   Drinks coffee (2-3 cups per day)   Retired   Social Drivers of [employer] Strain: Low Risk  (09/07/2022)   [facility]   Overall Financial Resource Strain (CARDIA)    Difficulty of Paying Living Expenses: Not hard at all  Food Insecurity: No Food Insecurity (09/07/2022)   Received from [facility], [facility]   Hunger Vital Sign    Worried About Running Out of Food in the Last Year: Never true    Ran Out of Food in the Last Year: Never true  Transportation Needs: No Transportation Needs (09/07/2022)   Received from [facility], [facility]   PRAPARE - Transportation    Lack of Transportation (Medical): No    Lack of Transportation (Non-Medical): No  Physical Activity: Sufficiently Active (09/07/2022)   Received from [facility], [facility]   Exercise Vital Sign    Days of Exercise per  Week: 3 days    Minutes of Exercise per Session: 60 min  Stress: No Stress Concern Present (09/07/2022)   Received from [facility], [facility] of Occupational Health - Occupational Stress Questionnaire    Feeling of Stress : Not at all  Social Connections: Somewhat Isolated (09/07/2022)   Received from [facility], [facility]   Social Network    How would you rate your social network (family, work, friends)?: Restricted participation with some degree of social isolation  Intimate Partner Violence: Not At Risk (09/07/2022)   Received from [facility], [facility]   HITS    Over the last 12 months how often did your partner physically hurt you?: Never    Over the last 12 months how often did your partner insult you or talk down to you?: Never    Over the last 12 months how often did your partner threaten you with physical harm?: Never    Over the last 12 months how often did your partner scream or curse at you?: Never     No Known Allergies   Outpatient Medications Prior to Visit  Medication Sig Dispense Refill   aspirin  81 MG EC tablet 1 tablet     budesonide-formoterol (SYMBICORT) 80-4.5 MCG/ACT inhaler Inhale 2 puffs into the lungs daily.     carbidopa -levodopa  (SINEMET  IR) 25-100 MG tablet TAKE 1 TABLET 4 TIMES DAILY 360 tablet 1   carvedilol  (COREG ) 12.5 MG tablet Take 1 tablet (12.5 mg total) by mouth 2 (two) times daily. 180 tablet 3   Cholecalciferol (VITAMIN D3) 50 MCG (2000 UT) capsule Take 2,000 Units by mouth daily.     Coenzyme Q10 (CO Q 10) 100 MG CAPS See admin instructions.     doxycycline (VIBRAMYCIN) 100 MG capsule 1 capsule.     ezetimibe  (ZETIA ) 10 MG tablet Take 1 tablet (10 mg total) by mouth daily. 90 tablet 3   latanoprost  (XALATAN ) 0.005 % ophthalmic solution Place 1 drop into the left eye at bedtime.     LORazepam  (ATIVAN ) 0.5 MG tablet One po every day prn 30 tablet 5   Multiple Vitamin (MULTIVITAMIN) capsule Take 1 capsule by  mouth daily.     Omega-3 Fatty Acids (FISH OIL PO) Take 1,290 mg by mouth daily.     pantoprazole  (PROTONIX ) 40 MG tablet 1 tablet     pravastatin  (PRAVACHOL ) 20 MG tablet TAKE 1 TABLET DAILY 90 tablet 3   telmisartan -hydrochlorothiazide (MICARDIS  HCT) 80-12.5 MG tablet Take 1 tablet by mouth daily. 90 tablet 1   No facility-administered medications prior to visit.    Review of Systems  Constitutional:  Negative for chills, fever, malaise/fatigue and weight loss.  HENT:  Positive  for congestion. Negative for sinus pain and sore throat.   Eyes: Negative.   Respiratory:  Positive for cough, sputum production, shortness of breath and wheezing. Negative for hemoptysis.   Cardiovascular:  Negative for chest pain, palpitations, orthopnea, claudication and leg swelling.  Gastrointestinal:  Positive for heartburn. Negative for abdominal pain, nausea and vomiting.  Genitourinary: Negative.   Musculoskeletal:  Negative for joint pain and myalgias.  Skin:  Negative for rash.  Neurological:  Negative for weakness.  Endo/Heme/Allergies: Negative.   Psychiatric/Behavioral: Negative.        Objective:   Vitals:   08/14/23 1127  BP: 134/78  Pulse: 71  SpO2: 96%  Weight: 300 lb (136.1 kg)  Height: 6' (1.829 m)     Physical Exam Constitutional:      General: He is not in acute distress.    Appearance: Normal appearance. He is obese.  Eyes:     General: No scleral icterus.    Conjunctiva/sclera: Conjunctivae normal.  Cardiovascular:     Rate and Rhythm: Normal rate and regular rhythm.  Pulmonary:     Breath sounds: No wheezing, rhonchi or rales.  Musculoskeletal:     Right lower leg: No edema.     Left lower leg: No edema.  Skin:    General: Skin is warm and dry.  Neurological:     General: No focal deficit present.    CBC    Component Value Date/Time   WBC 5.5 05/06/2023 0845   RBC 4.28 05/06/2023 0845   HGB 14.5 05/06/2023 0845   HGB 15.3 04/11/2017 1032   HCT 42.3  05/06/2023 0845   HCT 44.9 04/11/2017 1032   PLT 161 05/06/2023 0845   PLT 228 04/11/2017 1032   MCV 98.8 05/06/2023 0845   MCV 97 04/11/2017 1032   MCH 33.9 05/06/2023 0845   MCHC 34.3 05/06/2023 0845   RDW 13.0 05/06/2023 0845   RDW 14.5 04/11/2017 1032   LYMPHSABS 2.0 05/15/2021 1625   LYMPHSABS 1.3 04/11/2017 1032   MONOABS 0.7 05/15/2021 1625   EOSABS 0.0 05/15/2021 1625   EOSABS 0.1 04/11/2017 1032   BASOSABS 0.1 05/15/2021 1625   BASOSABS 0.0 04/11/2017 1032      Latest Ref Rng & Units 07/02/2023    9:18 AM 05/06/2023    8:45 AM 10/31/2022    9:53 AM  BMP  Glucose 70 - 99 mg/dL 161  096  045   BUN 8 - 27 mg/dL 18  12  15    Creatinine 0.76 - 1.27 mg/dL 4.09  8.11  9.14   BUN/Creat Ratio 10 - 24 19   16    Sodium 134 - 144 mmol/L 140  139  140   Potassium 3.5 - 5.2 mmol/L 4.1  4.0  4.5   Chloride 96 - 106 mmol/L 100  101  100   CO2 20 - 29 mmol/L 25  30  29    Calcium 8.6 - 10.2 mg/dL 8.9  9.1  9.9    Chest imaging: CXR 05/06/23 Low volume film. Similar chronic subsegmental atelectasis or scarring at the lung bases. No pulmonary edema or focal airspace consolidation. No substantial pleural effusion. Cardiopericardial silhouette is at upper limits of normal for size. No acute bony abnormality.  CTA Chest 02/15/23 Mediastinum/Nodes: Patent trachea. No thyroid  mass. Mildly enlarged prevascular lymph node measuring 12 mm, no change. Esophagus within normal limits.   Lungs/Pleura: No acute airspace disease, pleural effusion, or pneumothorax  PFT:    Latest Ref Rng & Units  06/16/2018   10:43 AM  PFT Results  FVC-Pre L 3.71  P  FVC-Predicted Pre % 78  P  FVC-Post L 3.79  P  FVC-Predicted Post % 80  P  Pre FEV1/FVC % % 77  P  Post FEV1/FCV % % 80  P  FEV1-Pre L 2.85  P  FEV1-Predicted Pre % 82  P  FEV1-Post L 3.04  P  DLCO uncorrected ml/min/mmHg 29.46  P  DLCO UNC% % 107  P  DLVA Predicted % 129  P  TLC L 6.87  P  TLC % Predicted % 92  P  RV % Predicted % 123   P    P Preliminary result    Labs:  Path:  Echo:  Heart Catheterization:     Assessment & Plan:   Mild intermittent asthma without complication  Post-nasal drip - Plan: ipratropium (ATROVENT ) 0.03 % nasal spray, fluticasone  (FLONASE ) 50 MCG/ACT nasal spray  Discussion: Todd Chan is a 78 year old male, former smoker with history of Parkinson's disease, GERD, hypertension, glaucoma, coronary artery disease and peripheral vascular disease who is referred to pulmonary clinic for shortness of breath and wheezing.   Reactive airway disease Intermittent symptoms likely due to reactive airways or asthma. Previous tests showed normal lung function with slight albuterol response. Current treatment with Symbicort has reduced symptoms. - Continue Symbicort inhaler, 2 puffs in the morning and 2 puffs in the evening. - Use albuterol inhaler as needed. - Consider increasing Symbicort dose if albuterol use increases. - hold off on updating PFTs due to claustrophobia  Postnasal drip Postnasal drip possibly contributing to cough. Discussed starting nasal sprays to manage symptoms. - Start Flonase  nasal spray, 1 spray per nostril daily. - Start ipratropium nasal spray, 2 sprays in the morning and evening for 1-2 weeks, then as needed.  Gastroesophageal reflux disease (GERD) GERD symptoms include belching and occasional heartburn. Current treatment with pantoprazole  at bedtime.  - Continue pantoprazole  at bedtime. - Consider moving pantoprazole  to the morning and adding famotidine at night if reflux is contributing to symptoms.  Follow up in 4 months.  Todd German, MD [facility] Office: [phone]    Current Outpatient Medications:    aspirin  81 MG EC tablet, 1 tablet, Disp: , Rfl:    budesonide-formoterol (SYMBICORT) 80-4.5 MCG/ACT inhaler, Inhale 2 puffs into the lungs daily., Disp: , Rfl:    carbidopa -levodopa  (SINEMET  IR) 25-100 MG tablet, TAKE 1  TABLET 4 TIMES DAILY, Disp: 360 tablet, Rfl: 1   carvedilol  (COREG ) 12.5 MG tablet, Take 1 tablet (12.5 mg total) by mouth 2 (two) times daily., Disp: 180 tablet, Rfl: 3   Cholecalciferol (VITAMIN D3) 50 MCG (2000 UT) capsule, Take 2,000 Units by mouth daily., Disp: , Rfl:    Coenzyme Q10 (CO Q 10) 100 MG CAPS, See admin instructions., Disp: , Rfl:    doxycycline (VIBRAMYCIN) 100 MG capsule, 1 capsule., Disp: , Rfl:    ezetimibe  (ZETIA ) 10 MG tablet, Take 1 tablet (10 mg total) by mouth daily., Disp: 90 tablet, Rfl: 3   fluticasone  (FLONASE ) 50 MCG/ACT nasal spray, Place 1 spray into both nostrils daily., Disp: 16 g, Rfl: 2   ipratropium (ATROVENT ) 0.03 % nasal spray, Place 2 sprays into both nostrils every 12 (twelve) hours., Disp: 30 mL, Rfl: 12   latanoprost  (XALATAN ) 0.005 % ophthalmic solution, Place 1 drop into the left eye at bedtime., Disp: , Rfl:    LORazepam  (ATIVAN ) 0.5 MG tablet, One po every  day prn, Disp: 30 tablet, Rfl: 5   Multiple Vitamin (MULTIVITAMIN) capsule, Take 1 capsule by mouth daily., Disp: , Rfl:    Omega-3 Fatty Acids (FISH OIL PO), Take 1,290 mg by mouth daily., Disp: , Rfl:    pantoprazole  (PROTONIX ) 40 MG tablet, 1 tablet, Disp: , Rfl:    pravastatin  (PRAVACHOL ) 20 MG tablet, TAKE 1 TABLET DAILY, Disp: 90 tablet, Rfl: 3   telmisartan -hydrochlorothiazide (MICARDIS  HCT) 80-12.5 MG tablet, Take 1 tablet by mouth daily., Disp: 90 tablet, Rfl: 1

## 2023-08-18 ENCOUNTER — Encounter: Payer: Self-pay | Admitting: Pulmonary Disease

## 2023-08-21 DIAGNOSIS — Z79899 Other long term (current) drug therapy: Secondary | ICD-10-CM | POA: Diagnosis not present

## 2023-08-21 DIAGNOSIS — G20A1 Parkinson's disease without dyskinesia, without mention of fluctuations: Secondary | ICD-10-CM | POA: Diagnosis not present

## 2023-08-21 DIAGNOSIS — R251 Tremor, unspecified: Secondary | ICD-10-CM | POA: Diagnosis not present

## 2023-08-21 DIAGNOSIS — G250 Essential tremor: Secondary | ICD-10-CM | POA: Diagnosis not present

## 2023-08-22 DIAGNOSIS — E785 Hyperlipidemia, unspecified: Secondary | ICD-10-CM | POA: Diagnosis not present

## 2023-08-22 DIAGNOSIS — J069 Acute upper respiratory infection, unspecified: Secondary | ICD-10-CM | POA: Diagnosis not present

## 2023-08-22 DIAGNOSIS — J302 Other seasonal allergic rhinitis: Secondary | ICD-10-CM | POA: Diagnosis not present

## 2023-08-22 DIAGNOSIS — Z6841 Body Mass Index (BMI) 40.0 and over, adult: Secondary | ICD-10-CM | POA: Diagnosis not present

## 2023-08-22 DIAGNOSIS — I25118 Atherosclerotic heart disease of native coronary artery with other forms of angina pectoris: Secondary | ICD-10-CM | POA: Diagnosis not present

## 2023-08-22 DIAGNOSIS — K219 Gastro-esophageal reflux disease without esophagitis: Secondary | ICD-10-CM | POA: Diagnosis not present

## 2023-08-22 DIAGNOSIS — I10 Essential (primary) hypertension: Secondary | ICD-10-CM | POA: Diagnosis not present

## 2023-08-22 DIAGNOSIS — Z1331 Encounter for screening for depression: Secondary | ICD-10-CM | POA: Diagnosis not present

## 2023-08-22 DIAGNOSIS — Z860100 Personal history of colon polyps, unspecified: Secondary | ICD-10-CM | POA: Diagnosis not present

## 2023-08-22 DIAGNOSIS — Z0000 Encounter for general adult medical examination without abnormal findings: Secondary | ICD-10-CM | POA: Diagnosis not present

## 2023-08-22 DIAGNOSIS — G250 Essential tremor: Secondary | ICD-10-CM | POA: Diagnosis not present

## 2023-08-22 DIAGNOSIS — G20B1 Parkinson's disease with dyskinesia, without mention of fluctuations: Secondary | ICD-10-CM | POA: Diagnosis not present

## 2023-09-18 DIAGNOSIS — H35042 Retinal micro-aneurysms, unspecified, left eye: Secondary | ICD-10-CM | POA: Diagnosis not present

## 2023-09-18 DIAGNOSIS — H401121 Primary open-angle glaucoma, left eye, mild stage: Secondary | ICD-10-CM | POA: Diagnosis not present

## 2023-09-18 DIAGNOSIS — H34812 Central retinal vein occlusion, left eye, with macular edema: Secondary | ICD-10-CM | POA: Diagnosis not present

## 2023-10-07 ENCOUNTER — Encounter: Payer: Self-pay | Admitting: Pulmonary Disease

## 2023-10-07 NOTE — Telephone Encounter (Signed)
**Note De-identified  [place] Obfuscation** Please advise 

## 2023-10-15 DIAGNOSIS — E782 Mixed hyperlipidemia: Secondary | ICD-10-CM | POA: Diagnosis not present

## 2023-10-16 LAB — LIPID PANEL
Chol/HDL Ratio: 2.6 ratio (ref 0.0–5.0)
Cholesterol, Total: 123 mg/dL (ref 100–199)
HDL: 48 mg/dL (ref >39)
LDL Chol Calc (NIH): 62 mg/dL (ref 0–99)
Triglycerides: 58 mg/dL (ref 0–149)
VLDL Cholesterol Cal: 13 mg/dL (ref 5–40)

## 2023-10-17 ENCOUNTER — Ambulatory Visit: Payer: Self-pay | Admitting: Cardiology

## 2023-11-07 ENCOUNTER — Encounter: Payer: Self-pay | Admitting: Podiatry

## 2023-11-07 ENCOUNTER — Ambulatory Visit (INDEPENDENT_AMBULATORY_CARE_PROVIDER_SITE_OTHER): Admitting: Podiatry

## 2023-11-07 DIAGNOSIS — I739 Peripheral vascular disease, unspecified: Secondary | ICD-10-CM

## 2023-11-07 DIAGNOSIS — B351 Tinea unguium: Secondary | ICD-10-CM | POA: Diagnosis not present

## 2023-11-07 DIAGNOSIS — M79674 Pain in right toe(s): Secondary | ICD-10-CM

## 2023-11-07 DIAGNOSIS — G609 Hereditary and idiopathic neuropathy, unspecified: Secondary | ICD-10-CM | POA: Diagnosis not present

## 2023-11-07 DIAGNOSIS — M79675 Pain in left toe(s): Secondary | ICD-10-CM

## 2023-11-07 NOTE — Progress Notes (Signed)
  Subjective:  Patient ID: Todd Chan, male    DOB: July 03, 1945,   MRN: [id number]  Chief Complaint  Patient presents with   Nail Problem    Clip my nails.  Wife stated, He has a sore on his toe on the left foot. (2nd medial)    78 y.o. male presents for concern of painful thickened and elongated toenails with a history of PVD and neuropathy. Denies diabetes. Hoping to have nails trimmed   . Denies any other pedal complaints. Denies n/v/f/c.   PCP: Clotilda Barefoot DO   Past Medical History:  Diagnosis Date   Arthritis    GERD (gastroesophageal reflux disease)    Glaucoma    Hypertension    Osteoarthritis of knee    Painful urination    Peripheral vascular disease (HCC)    bilateral edema     Objective:  Physical Exam: Vascular: DP/PT pulses 1/4 bilateral. CFT <3 seconds. Normal hair growth on digits. No edema.  Skin. No lacerations or abrasions bilateral feet. Nails 1-5 are thickened discolored and elongated with subungual debris. Left second digit medial small blistered area that appears to be healing well underlying this.  Musculoskeletal: MMT 5/5 bilateral lower extremities in DF, PF, Inversion and Eversion. Deceased ROM in DF of ankle joint. No pain to palpation.  Neurological: Sensation diminished to light touch. Protective sensation present to all 10 locations with SWMF.   Assessment:   1. Pain due to onychomycosis of toenails of both feet   2. Idiopathic peripheral neuropathy   3. Peripheral vascular disease (HCC)           Plan:  Patient was evaluated and treated and all questions answered. Discussed neuropathy and etiology as well as treatment with patient.  -Discussed and educated patient on foot care, especially with  regards to the vascular, neurological and musculoskeletal systems.  -Discussed supportive shoes at all times and checking feet regularly.  -Nails 1-5 b/l debrided today without incident.  -Patient to return in 3 months for at risk  foot care.    Asberry Failing, DPM

## 2023-12-02 ENCOUNTER — Other Ambulatory Visit: Payer: Self-pay | Admitting: Emergency Medicine

## 2023-12-10 ENCOUNTER — Encounter: Payer: Self-pay | Admitting: Neurology

## 2023-12-10 ENCOUNTER — Other Ambulatory Visit: Payer: Self-pay | Admitting: Neurology

## 2023-12-10 DIAGNOSIS — H9202 Otalgia, left ear: Secondary | ICD-10-CM

## 2023-12-10 DIAGNOSIS — R519 Headache, unspecified: Secondary | ICD-10-CM

## 2023-12-13 ENCOUNTER — Ambulatory Visit (HOSPITAL_BASED_OUTPATIENT_CLINIC_OR_DEPARTMENT_OTHER)
Admission: RE | Admit: 2023-12-13 | Discharge: 2023-12-13 | Disposition: A | Discharge status: Home / Self Care | Source: Ambulatory Visit | Attending: Neurology | Admitting: Neurology

## 2023-12-13 DIAGNOSIS — H9202 Otalgia, left ear: Secondary | ICD-10-CM | POA: Insufficient documentation

## 2023-12-13 DIAGNOSIS — R519 Headache, unspecified: Secondary | ICD-10-CM | POA: Diagnosis not present

## 2023-12-13 DIAGNOSIS — R9082 White matter disease, unspecified: Secondary | ICD-10-CM | POA: Diagnosis not present

## 2023-12-18 ENCOUNTER — Other Ambulatory Visit: Payer: Self-pay

## 2023-12-18 ENCOUNTER — Encounter: Payer: Self-pay | Admitting: Physical Therapy

## 2023-12-18 ENCOUNTER — Ambulatory Visit: Attending: Neurology | Admitting: Physical Therapy

## 2023-12-18 DIAGNOSIS — R2681 Unsteadiness on feet: Secondary | ICD-10-CM | POA: Insufficient documentation

## 2023-12-18 DIAGNOSIS — R29818 Other symptoms and signs involving the nervous system: Secondary | ICD-10-CM | POA: Diagnosis not present

## 2023-12-18 DIAGNOSIS — R293 Abnormal posture: Secondary | ICD-10-CM | POA: Insufficient documentation

## 2023-12-18 DIAGNOSIS — R2689 Other abnormalities of gait and mobility: Secondary | ICD-10-CM | POA: Diagnosis not present

## 2023-12-18 NOTE — Therapy (Signed)
 OUTPATIENT PHYSICAL THERAPY NEURO EVALUATION   Patient Name: Todd Chan MRN: [id number] DOB:[birth year], 78 y.o., male Today's Date: 12/18/2023   PCP: Cindy Clotilda CHRISTELLA, DO REFERRING PROVIDER: Hans Cinderella Duck, MD   END OF SESSION:  PT End of Session - 12/18/23 1550     Visit Number 1    Number of Visits 9    Date for PT Re-Evaluation 01/17/24    Authorization Type Medicare/[employer]    PT Start Time 1404    PT Stop Time 1445    PT Time Calculation (min) 41 min    Activity Tolerance Patient tolerated treatment well    Behavior During Therapy WFL for tasks assessed/performed          Past Medical History:  Diagnosis Date   Arthritis    GERD (gastroesophageal reflux disease)    Glaucoma    Hypertension    Osteoarthritis of knee    Painful urination    Peripheral vascular disease (HCC)    bilateral edema    Past Surgical History:  Procedure Laterality Date   CARDIAC CATHETERIZATION  04/2017   CATARACT EXTRACTION, BILATERAL Bilateral    Left: 10/30/21, right: 11/20/21.   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   TONSILLECTOMY     TOTAL KNEE ARTHROPLASTY Left 10/08/2016   Procedure: LEFT TOTAL KNEE ARTHROPLASTY;  Surgeon: Melodi Lerner, MD;  Location: [facility];  Service: Orthopedics;  Laterality: Left;   TOTAL KNEE ARTHROPLASTY Right 06/17/2017   Procedure: RIGHT TOTAL KNEE  ARTHROPLASTY;  Surgeon: Melodi Lerner, MD;  Location: [facility];  Service: Orthopedics;  Laterality: Right;   Patient Active Problem List   Diagnosis Date Noted   Parkinson's disease (HCC) 08/02/2021   Barrett's esophagus 07/13/2021   Benign prostatic hyperplasia with lower urinary tract symptoms 07/13/2021   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   Essential tremor 07/13/2021   Gallstones 07/13/2021   Gastro-esophageal reflux disease with esophagitis, without bleeding 07/13/2021   Inflammatory and toxic neuropathy (HCC) 07/13/2021   History of colonic polyps 07/13/2021   Seasonal allergic rhinitis 07/13/2021   Primary open-angle glaucoma, left eye, mild stage 05/09/2021   Disequilibrium 09/27/2020   Cataract, nuclear sclerotic, both eyes 07/06/2020   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/2020   Tremor 01/21/2020   Polyneuropathy 01/21/2020   Stuffy ears, bilateral 01/21/2020   Otalgia of both ears 01/21/2020   Other headache syndrome 01/21/2020   Carotid stenosis, asymptomatic, bilateral 01/21/2020   Episodic lightheadedness 05/28/2019   Ascending aortic aneurysm (HCC) 4.2 cm on CT from January 2020 05/28/2019   Snoring 05/28/2019   Pleurisy 04/10/2018   Abnormal nuclear stress test    Exertional dyspnea    Abnormal myocardial perfusion study 04/11/2017   Chest pain in adult 03/18/2017   Edema 03/18/2017   Mild CAD 03/18/2017   Peripheral vascular disease (HCC)    Painful urination  Osteoarthritis of knee    Hypertension    Hyperlipidemia    GERD (gastroesophageal reflux disease)    Fibromyalgia    Arthritis    OA (osteoarthritis) of knee 10/08/2016    ONSET DATE: 11/20/2023 (MD referral)  REFERRING DIAG: G20.A1 (ICD-10-CM) - Parkinson's disease without dyskinesia, without mention of fluctuations   THERAPY DIAG:  Unsteadiness on feet  Other abnormalities of gait and mobility  Abnormal posture  Other symptoms and  signs involving the nervous system  Rationale for Evaluation and Treatment: Rehabilitation  SUBJECTIVE:                                                                                                                                                                                             SUBJECTIVE STATEMENT: Have had some tightness in my neck and was having some pain/fullness in my ear; contacted my neurologist and he ordered CT scan.  Have not heard back from that yet.  I'm not as active as I was before and I want to be more active.  Would like to have a Systems analyst. Pt accompanied by: self  PERTINENT HISTORY: HTN, lightheadedness, neck pain, arthritis, GERD, glaucoma, OA knee s/p L TKR 2018 and R TKR 2019, PVD, polyneuropathy   PAIN:  Are you having pain? No pain today; neck pain comes and goes (seeing chiropractor)  PRECAUTIONS: Fall  RED FLAGS: None   WEIGHT BEARING RESTRICTIONS: No  FALLS: Has patient fallen in last 6 months? No  LIVING ENVIRONMENT: Lives with: lives with their spouse Lives in: House/apartment Stairs: 1-2 steps to enter; steps to second floor with handrail Has following equipment at home: Single point cane  PLOF: Independent and Leisure: Using [employer] daily (at home); walks daily  PATIENT GOALS: get movement going again and try to be more active  OBJECTIVE:  Note: Objective measures were completed at Evaluation unless otherwise noted.  DIAGNOSTIC FINDINGS: CT: awaiting results (due to neck and ear pain)  COGNITION: Overall cognitive status: Within functional limits for tasks assessed   SENSATION: Hx of neuropathy  COORDINATION: Tremor L hand; reports changes in handwriting  POSTURE: rounded shoulders and forward head  LOWER EXTREMITY ROM:   WFL  Active  Right Eval Left Eval  Hip flexion    Hip extension    Hip abduction    Hip adduction    Hip internal rotation    Hip external rotation    Knee flexion    Knee extension     Ankle dorsiflexion    Ankle plantarflexion    Ankle inversion    Ankle eversion     (Blank rows =  not tested)  LOWER EXTREMITY MMT:    MMT Right Eval Left Eval  Hip flexion 5 5  Hip extension    Hip abduction    Hip adduction    Hip internal rotation    Hip external rotation    Knee flexion 5 5  Knee extension 5 5  Ankle dorsiflexion 4+ 4  Ankle plantarflexion    Ankle inversion    Ankle eversion    (Blank rows = not tested)  TRANSFERS: Sit to stand: Modified independence  Assistive device utilized: None     Stand to sit: Modified independence  Assistive device utilized: None      GAIT: Findings: Gait Characteristics: step through pattern, decreased arm swing- Left, decreased step length- Right, and decreased step length- Left, Distance walked: 50 ft, Assistive device utilized:None, and Level of assistance: Modified independence  FUNCTIONAL TESTS:  5 times sit to stand: 10.81 sec Timed up and go (TUG): 10.5 10 meter walk test: 9.44 sec (3.48 ft/sec) Mini-BESTest: 21/28 (<22/28 indicates increased fall risk 3 M walk back:  5.94 sec TUG cognitive:  11.88 sec   [facility] PT Assessment - 12/18/23 1424       Standardized Balance Assessment   Standardized Balance Assessment Mini-BESTest      Mini-BESTest   Sit To Stand Normal: Comes to stand without use of hands and stabilizes independently.    Rise to Toes < 3 s.    Stand on one leg (left) Moderate: < 20 s   1.4,< 1 sec   Stand on one leg (right) Moderate: < 20 s   1.78, 1.44   Stand on one leg - lowest score 1    Compensatory Stepping Correction - Forward Normal: Recovers independently with a single, large step (second realignement is allowed).    Compensatory Stepping Correction - Backward No step, OR would fall if not caught, OR falls spontaneously.    Compensatory Stepping Correction - Left Lateral Severe: Falls, or cannot step    Compensatory Stepping Correction - Right Lateral Severe:  Falls, or cannot step     Stepping Corredtion Lateral - lowest score 0    Stance - Feet together, eyes open, firm surface  Normal: 30s    Stance - Feet together, eyes closed, foam surface  Normal: 30s    Incline - Eyes Closed Normal: Stands independently 30s and aligns with gravity    Change in Gait Speed Normal: Significantly changes walkling speed without imbalance    Walk with head turns - Horizontal Normal: performs head turns with no change in gait speed and good balance    Walk with pivot turns Normal: Turns with feet close FAST (< 3 steps) with good balance.    Step over obstacles Normal: Able to step over box with minimal change of gait speed and with good balance.    Timed UP & GO with Dual Task Normal: No noticeable change in sitting, standing or walking while backward counting when compared to TUG without    Mini-BEST total score 21  TREATMENT DATE: 12/18/2023    PATIENT EDUCATION: Education details: Eval results, POC; answered pt's questions about his Nustep positioning, level of intensity and practiced 30 sec intervals of increased intensity (>80-90 SPM) Person educated: Patient and Spouseat end of session Education method: Explanation and Demonstration Education comprehension: verbalized understanding and returned demonstration  HOME EXERCISE PROGRAM: Not yet initiated  GOALS: Goals reviewed with patient? Yes  SHORT TERM GOALS:= LTGs   LONG TERM GOALS: Target date: 01/17/2024  Pt will be independent with HEP for improved strength, general flexibility, balance, gait per PD Foundation guidelines. Baseline:  Goal status: INITIAL  2.  Pt will improve MiniBESTest score to at least 23/28 to decrease fall risk. Baseline: 21/28 Goal status: INITIAL  3.  Pt will verbalize understanding of local Parkinson's disease community resources, including continued community  fitness, to maximize gains made in PT.  Baseline:  Goal status: INITIAL  4.  Pt will improve 5x sit<>stand to less than or equal to 9 sec to demonstrate improved functional strength and transfer efficiency.  Baseline:  10.81 sec  Goal status:  INITIAL    ASSESSMENT:  CLINICAL IMPRESSION: Patient is a 78 y.o. male who was seen today for physical therapy evaluation and treatment for Parkinson's disease.  He is known to this clinic from previous bout of PT approximately 1 1/2 years ago.  He presents today and reports he wants to get back into regular exercise routine and make sure he is utilizing his NuStep at home properly.  He presents with slightly slowed FTSTS and 3 M walk backwards measures compared to last therapy sessions.  He does present at increased fall risk per MiniBESTest; he has increased LUE tremor, decreased timing and coordination of gait, decreased balance.  He would benefit from skilled PT to address the above stated deficits for decreased fall risk and overall improved functional mobility and independence.   OBJECTIVE IMPAIRMENTS: Abnormal gait, decreased balance, decreased mobility, decreased strength, and postural dysfunction.   ACTIVITY LIMITATIONS: locomotion level  PARTICIPATION LIMITATIONS: community activity and fitness  PERSONAL FACTORS: 3+ comorbidities: see above are also affecting patient's functional outcome.   REHAB POTENTIAL: Good  CLINICAL DECISION MAKING: Stable/uncomplicated  EVALUATION COMPLEXITY: Low  PLAN:  PT FREQUENCY: 2x/week  PT DURATION: 4 weeks plus eval  PLANNED INTERVENTIONS: 97750- Physical Performance Testing, 97110-Therapeutic exercises, 97530- Therapeutic activity, [id number]- Neuromuscular re-education, 97535- Self Care, [id number]- Gait training, Patient/Family education, and Balance training  PLAN FOR NEXT SESSION: PWR! Moves standing, balance reactions forward/back/side, SLS, compliant surface; use of Nustep (pt has one at home)-discuss  interval training with this; general stretching, strengthening and discuss plans for trainer (likely at [facility])   STARLET GREIG ORN., PT 12/18/2023, 3:51 PM  [facility] at [facility] [address], [address] [place], [place] [address] Phone # [phone] Fax # [phone]

## 2023-12-19 NOTE — Telephone Encounter (Signed)
 Tori-can you help with this? Thank you!

## 2023-12-24 ENCOUNTER — Ambulatory Visit: Payer: Self-pay | Admitting: Diagnostic Neuroimaging

## 2023-12-25 DIAGNOSIS — H34812 Central retinal vein occlusion, left eye, with macular edema: Secondary | ICD-10-CM | POA: Diagnosis not present

## 2023-12-25 DIAGNOSIS — H401121 Primary open-angle glaucoma, left eye, mild stage: Secondary | ICD-10-CM | POA: Diagnosis not present

## 2023-12-25 DIAGNOSIS — Z961 Presence of intraocular lens: Secondary | ICD-10-CM | POA: Diagnosis not present

## 2023-12-25 DIAGNOSIS — H401111 Primary open-angle glaucoma, right eye, mild stage: Secondary | ICD-10-CM | POA: Diagnosis not present

## 2023-12-31 NOTE — Therapy (Incomplete)
 OUTPATIENT PHYSICAL THERAPY NEURO TREATMENT   Patient Name: OLAF MESA MRN: [id number] DOB:[birth year], 78 y.o., male Today's Date: 12/31/2023   PCP: Cindy Clotilda CHRISTELLA, DO REFERRING PROVIDER: Hans Cinderella Duck, MD   END OF SESSION:    Past Medical History:  Diagnosis Date   Arthritis    GERD (gastroesophageal reflux disease)    Glaucoma    Hypertension    Osteoarthritis of knee    Painful urination    Peripheral vascular disease    bilateral edema    Past Surgical History:  Procedure Laterality Date   CARDIAC CATHETERIZATION  04/2017   CATARACT EXTRACTION, BILATERAL Bilateral    Left: 10/30/21, right: 11/20/21.   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   TONSILLECTOMY     TOTAL KNEE ARTHROPLASTY Left 10/08/2016   Procedure: LEFT TOTAL KNEE ARTHROPLASTY;  Surgeon: Melodi Lerner, MD;  Location: [facility];  Service: Orthopedics;  Laterality: Left;   TOTAL KNEE ARTHROPLASTY Right 06/17/2017   Procedure: RIGHT TOTAL KNEE ARTHROPLASTY;  Surgeon: Melodi Lerner, MD;  Location: [facility];  Service: Orthopedics;  Laterality: Right;   Patient Active Problem List   Diagnosis Date Noted   Parkinson's disease (HCC) 08/02/2021   Barrett's esophagus 07/13/2021   Benign prostatic hyperplasia with lower urinary tract symptoms 07/13/2021   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   Essential tremor  07/13/2021   Gallstones 07/13/2021   Gastro-esophageal reflux disease with esophagitis, without bleeding 07/13/2021   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   History of colonic polyps 07/13/2021   Seasonal allergic rhinitis 07/13/2021   Primary open-angle glaucoma, left eye, mild stage 05/09/2021   Disequilibrium 09/27/2020   Cataract, nuclear sclerotic, both eyes 07/06/2020   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/2020   Tremor 01/21/2020   Polyneuropathy 01/21/2020   Stuffy ears, bilateral 01/21/2020   Otalgia of both ears 01/21/2020   Other headache syndrome 01/21/2020   Carotid stenosis, asymptomatic, bilateral 01/21/2020   Episodic lightheadedness 05/28/2019   Ascending aortic aneurysm (HCC) 4.2 cm on CT from January 2020 05/28/2019   Snoring 05/28/2019   Pleurisy 04/10/2018   Abnormal nuclear stress test    Exertional dyspnea    Abnormal myocardial perfusion study 04/11/2017   Chest pain in adult 03/18/2017   Edema 03/18/2017   Mild CAD 03/18/2017   Peripheral vascular disease    Painful urination    Osteoarthritis of knee    Hypertension    Hyperlipidemia    GERD (gastroesophageal reflux disease)    Fibromyalgia    Arthritis    OA (osteoarthritis) of knee 10/08/2016    ONSET DATE: 11/20/2023 (MD referral)  REFERRING DIAG: G20.A1 (ICD-10-CM) - Parkinson's disease without dyskinesia, without mention of fluctuations   THERAPY DIAG:  No diagnosis found.  Rationale for Evaluation and Treatment: Rehabilitation  SUBJECTIVE:  SUBJECTIVE STATEMENT: Have had some tightness in my neck and was having some pain/fullness in my ear; contacted my neurologist and he ordered CT scan.  Have not heard back from that yet.  I'm not as active as I  was before and I want to be more active.  Would like to have a Systems analyst. Pt accompanied by: self  PERTINENT HISTORY: HTN, lightheadedness, neck pain, arthritis, GERD, glaucoma, OA knee s/p L TKR 2018 and R TKR 2019, PVD, polyneuropathy   PAIN:  Are you having pain? No pain today; neck pain comes and goes (seeing chiropractor)  PRECAUTIONS: Fall  RED FLAGS: None   WEIGHT BEARING RESTRICTIONS: No  FALLS: Has patient fallen in last 6 months? No  LIVING ENVIRONMENT: Lives with: lives with their spouse Lives in: House/apartment Stairs: 1-2 steps to enter; steps to second floor with handrail Has following equipment at home: Single point cane  PLOF: Independent and Leisure: Using [employer] daily (at home); walks daily  PATIENT GOALS: get movement going again and try to be more active  OBJECTIVE:     TODAY'S TREATMENT: 01/01/24 Activity Comments                          Note: Objective measures were completed at Evaluation unless otherwise noted.  DIAGNOSTIC FINDINGS: CT: awaiting results (due to neck and ear pain)  COGNITION: Overall cognitive status: Within functional limits for tasks assessed   SENSATION: Hx of neuropathy  COORDINATION: Tremor L hand; reports changes in handwriting  POSTURE: rounded shoulders and forward head  LOWER EXTREMITY ROM:   WFL  Active  Right Eval Left Eval  Hip flexion    Hip extension    Hip abduction    Hip adduction    Hip internal rotation    Hip external rotation    Knee flexion    Knee extension    Ankle dorsiflexion    Ankle plantarflexion    Ankle inversion    Ankle eversion     (Blank rows = not tested)  LOWER EXTREMITY MMT:    MMT Right Eval Left Eval  Hip flexion 5 5  Hip extension    Hip abduction    Hip adduction    Hip internal rotation    Hip external rotation    Knee flexion 5 5  Knee extension 5 5  Ankle dorsiflexion 4+ 4  Ankle plantarflexion    Ankle inversion    Ankle eversion     (Blank rows = not tested)  TRANSFERS: Sit to stand: Modified independence  Assistive device utilized: None     Stand to sit: Modified independence  Assistive device utilized: None      GAIT: Findings: Gait Characteristics: step through pattern, decreased arm swing- Left, decreased step length- Right, and decreased step length- Left, Distance walked: 50 ft, Assistive device utilized:None, and Level of assistance: Modified independence  FUNCTIONAL TESTS:  5 times sit to stand: 10.81 sec Timed up and go (TUG): 10.5 10 meter walk test: 9.44 sec (3.48 ft/sec) Mini-BESTest: 21/28 (<22/28 indicates increased fall risk 3 M walk back:  5.94 sec TUG cognitive:  11.88 sec  TREATMENT DATE: 12/18/2023    PATIENT EDUCATION: Education details: Eval results, POC; answered pt's questions about his Nustep positioning, level of intensity and practiced 30 sec intervals of increased intensity (>80-90 SPM) Person educated: Patient and Spouseat end of session Education method: Explanation and Demonstration Education comprehension: verbalized understanding and returned demonstration  HOME EXERCISE PROGRAM: Not yet initiated  GOALS: Goals reviewed with patient? Yes  SHORT TERM GOALS:= LTGs   LONG TERM GOALS: Target date: 01/17/2024  Pt will be independent with HEP for improved strength, general flexibility, balance, gait per PD Foundation guidelines. Baseline:  Goal status: IN PROGRESS  2.  Pt will improve MiniBESTest score to at least 23/28 to decrease fall risk. Baseline: 21/28 Goal status: IN PROGRESS  3.  Pt will verbalize understanding of local Parkinson's disease community resources, including continued community fitness, to maximize gains made in PT.  Baseline:  Goal status: IN PROGRESS  4.  Pt will improve 5x sit<>stand to less than or equal to 9 sec to  demonstrate improved functional strength and transfer efficiency.  Baseline:  10.81 sec  Goal status:  IN PROGRESS    ASSESSMENT:  CLINICAL IMPRESSION: Patient is a 78 y.o. male who was seen today for physical therapy evaluation and treatment for Parkinson's disease.  He is known to this clinic from previous bout of PT approximately 1 1/2 years ago.  He presents today and reports he wants to get back into regular exercise routine and make sure he is utilizing his NuStep at home properly.  He presents with slightly slowed FTSTS and 3 M walk backwards measures compared to last therapy sessions.  He does present at increased fall risk per MiniBESTest; he has increased LUE tremor, decreased timing and coordination of gait, decreased balance.  He would benefit from skilled PT to address the above stated deficits for decreased fall risk and overall improved functional mobility and independence.   OBJECTIVE IMPAIRMENTS: Abnormal gait, decreased balance, decreased mobility, decreased strength, and postural dysfunction.   ACTIVITY LIMITATIONS: locomotion level  PARTICIPATION LIMITATIONS: community activity and fitness  PERSONAL FACTORS: 3+ comorbidities: see above are also affecting patient's functional outcome.   REHAB POTENTIAL: Good  CLINICAL DECISION MAKING: Stable/uncomplicated  EVALUATION COMPLEXITY: Low  PLAN:  PT FREQUENCY: 2x/week  PT DURATION: 4 weeks plus eval  PLANNED INTERVENTIONS: 97750- Physical Performance Testing, 97110-Therapeutic exercises, 97530- Therapeutic activity, [id number]- Neuromuscular re-education, 97535- Self Care, [id number]- Gait training, Patient/Family education, and Balance training  PLAN FOR NEXT SESSION: PWR! Moves standing, balance reactions forward/back/side, SLS, compliant surface; use of Nustep (pt has one at home)-discuss interval training with this; general stretching, strengthening and discuss plans for trainer (likely at [facility])   Slater MARLA Christians,  PT 12/31/2023, 12:50 PM  [facility] at [facility] [address] [place], [place] [address] Phone # [phone] Fax # [phone]

## 2024-01-01 ENCOUNTER — Ambulatory Visit: Admitting: Physical Therapy

## 2024-01-01 ENCOUNTER — Other Ambulatory Visit: Payer: Self-pay | Admitting: Cardiology

## 2024-01-02 ENCOUNTER — Encounter: Payer: Self-pay | Admitting: Pulmonary Disease

## 2024-01-02 ENCOUNTER — Ambulatory Visit (INDEPENDENT_AMBULATORY_CARE_PROVIDER_SITE_OTHER): Admitting: Pulmonary Disease

## 2024-01-02 VITALS — BP 138/78 | HR 65 | Ht 72.0 in | Wt 296.6 lb

## 2024-01-02 DIAGNOSIS — H6121 Impacted cerumen, right ear: Secondary | ICD-10-CM | POA: Diagnosis not present

## 2024-01-02 DIAGNOSIS — J302 Other seasonal allergic rhinitis: Secondary | ICD-10-CM

## 2024-01-02 DIAGNOSIS — R0982 Postnasal drip: Secondary | ICD-10-CM | POA: Diagnosis not present

## 2024-01-02 DIAGNOSIS — J4520 Mild intermittent asthma, uncomplicated: Secondary | ICD-10-CM

## 2024-01-02 NOTE — Patient Instructions (Addendum)
 Continue flonase  nasal spray, 1 spray per nostril daily  Resume ipratropium nasal spray, 2 sprays per nostril twice daily - use   Continue symbicort inhaler 2 puffs twice daily - rinse mouth out after each use  Use albuterol inhaler 1-2 puffs every 4-6 hours as needed  Continue pantoprazole  40mg  daily at bedtime. Monitor your reflux symptoms in relation to your cough and wheezing. Discuss with your GI doctor about adding famotidine scheduled. Continue as needed for now.  Start Zyrtec or Allegra daily for possible allergies adding to nasal drip and left ear congestion.  Discuss with primary doctor about ear wax removal of right ear  Follow up in 6 months

## 2024-01-02 NOTE — Progress Notes (Signed)
 Synopsis: Referred in [facility] for Dyspnea and wheezing  Subjective:   PATIENT ID: Todd Chan GENDER: male DOB: 12/08/1945, MRN: [id number]   HPI  Chief Complaint  Patient presents with   Medical Management of Chronic Issues    Pt states Lt headache, SOB, congestion    Todd Chan is a 78 year old male, former smoker with history of Parkinson's disease, GERD, hypertension, glaucoma, coronary artery disease and peripheral vascular disease who returns to pulmonary clinic for asthma.   Initial OV 08/14/23 He has a persistent cough for over eight months, severe at night, sometimes leading to near vomiting. The cough is associated with postnasal drip, especially in the mornings, and worsens with seasonal changes, particularly in fall and spring. It also exacerbates after eating or during the night. He produces clear phlegm in the morning.  He experiences wheezing, which has decreased since starting Symbicort, taken as two puffs in the morning and evening. Albuterol is used as needed, but less frequently since starting Symbicort. He recently completed a course of doxycycline.  He has heartburn and frequent belching, managed with pantoprazole  at bedtime, previously taken twice daily.   Nasal drainage is present, contributing to his cough. He has used Flonase  in the past but not recently. An air purifier in the bedroom is used, which he believes reduces wheezing. No specific allergies are diagnosed, but he suspects pollen and environmental factors may contribute to symptoms.   PFTs 2020 were within normal limits. He quit smoking in 1989.  OV 01/02/24   Past Medical History:  Diagnosis Date   Arthritis    GERD (gastroesophageal reflux disease)    Glaucoma    Hypertension    Osteoarthritis of knee    Painful urination    Peripheral vascular disease    bilateral edema      Family History  Problem Relation Age of Onset   Breast cancer Mother 67   Prostate cancer Father     Breast cancer Sister 59   Parkinson's disease Sister    Healthy Daughter    Neuropathy Neg Hx      Social History   Socioeconomic History   Marital status: Married    Spouse name: Not on file   Number of children: 2   Years of education: Not on file   Highest education level: Bachelor's degree (e.g., BA, AB, BS)  Occupational History   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[employer]  Tobacco Use   Smoking status: Former    Current packs/day: 0.00    Average packs/day: 1 pack/day for 20.0 years (20.0 ttl pk-yrs)    Types: Cigarettes    Start date: 10/1967    Quit date: 10/1987    Years since quitting: 36.2   Smokeless tobacco: Never   Tobacco comments:    Quit 34 years ago  Vaping Use   Vaping status: Never Used  Substance and Sexual Activity   Alcohol use: Yes    Alcohol/week: 7.0 standard drinks of alcohol    Types: 7 Glasses of wine per week    Comment: daily, 2-3 glasses scotch per day   Drug use: No   Sexual activity: Yes  Other Topics Concern   Not on file  Social History Narrative   Lives at home with his wife   Left handed   Drinks coffee (2-3 cups per day)   Retired   Social Drivers of [employer] Strain: Low Risk  (09/07/2022)  Received from [facility]   Overall Financial Resource Strain (CARDIA)    Difficulty of Paying Living Expenses: Not hard at all  Food Insecurity: No Food Insecurity (09/07/2022)   Received from [facility]   Hunger Vital Sign    Within the past 12 months, you worried that your food would run out before you got the money to buy more.: Never true    Within the past 12 months, the food you bought just didn't last and you didn't have money to get more.: Never true  Transportation Needs: No Transportation Needs (09/07/2022)   Received from [facility] - Transportation    Lack of Transportation (Medical): No    Lack of Transportation (Non-Medical): No  Physical Activity: Sufficiently  Active (09/07/2022)   Received from [facility]   Exercise Vital Sign    On average, how many days per week do you engage in moderate to strenuous exercise (like a brisk walk)?: 3 days    On average, how many minutes do you engage in exercise at this level?: 60 min  Stress: No Stress Concern Present (09/07/2022)   Received from [facility] - Occupational Stress Questionnaire    Feeling of Stress : Not at all  Social Connections: Somewhat Isolated (09/07/2022)   Received from [facility]   Social Network    How would you rate your social network (family, work, friends)?: Restricted participation with some degree of social isolation  Intimate Partner Violence: Not At Risk (09/07/2022)   Received from [facility]   HITS    Over the last 12 months how often did your partner physically hurt you?: Never    Over the last 12 months how often did your partner insult you or talk down to you?: Never    Over the last 12 months how often did your partner threaten you with physical harm?: Never    Over the last 12 months how often did your partner scream or curse at you?: Never     No Known Allergies   Outpatient Medications Prior to Visit  Medication Sig Dispense Refill   aspirin  81 MG EC tablet 1 tablet     budesonide-formoterol (SYMBICORT) 80-4.5 MCG/ACT inhaler Inhale 2 puffs into the lungs daily.     carbidopa -levodopa  (SINEMET  IR) 25-100 MG tablet TAKE 1 TABLET 4 TIMES DAILY 360 tablet 1   carvedilol  (COREG ) 12.5 MG tablet Take 1 tablet (12.5 mg total) by mouth 2 (two) times daily. 180 tablet 3   Cholecalciferol (VITAMIN D3) 50 MCG (2000 UT) capsule Take 2,000 Units by mouth daily.     Coenzyme Q10 (CO Q 10) 100 MG CAPS See admin instructions.     ezetimibe  (ZETIA ) 10 MG tablet Take 1 tablet (10 mg total) by mouth daily. 90 tablet 3   fluticasone  (FLONASE ) 50 MCG/ACT nasal spray Place 1 spray into both nostrils daily. 16 g 2   ipratropium  (ATROVENT ) 0.03 % nasal spray Place 2 sprays into both nostrils every 12 (twelve) hours. 30 mL 12   latanoprost  (XALATAN ) 0.005 % ophthalmic solution Place 1 drop into the left eye at bedtime.     Multiple Vitamin (MULTIVITAMIN) capsule Take 1 capsule by mouth daily.     Omega-3 Fatty Acids (FISH OIL PO) Take 1,290 mg by mouth daily.     pantoprazole  (PROTONIX ) 40 MG tablet 1 tablet     pravastatin  (PRAVACHOL ) 20 MG tablet TAKE 1 TABLET DAILY 90 tablet  3   telmisartan -hydrochlorothiazide (MICARDIS  HCT) 80-12.5 MG tablet TAKE 1 TABLET BY MOUTH EVERY DAY 90 tablet 1   doxycycline (VIBRAMYCIN) 100 MG capsule 1 capsule.     LORazepam  (ATIVAN ) 0.5 MG tablet One po every day prn (Patient not taking: Reported on 12/18/2023) 30 tablet 5   No facility-administered medications prior to visit.    Review of Systems  Constitutional:  Negative for chills, fever, malaise/fatigue and weight loss.  HENT:  Positive for congestion. Negative for sinus pain and sore throat.   Eyes: Negative.   Respiratory:  Positive for cough, sputum production, shortness of breath and wheezing. Negative for hemoptysis.   Cardiovascular:  Negative for chest pain, palpitations, orthopnea, claudication and leg swelling.  Gastrointestinal:  Positive for heartburn. Negative for abdominal pain, nausea and vomiting.  Genitourinary: Negative.   Musculoskeletal:  Negative for joint pain and myalgias.  Skin:  Negative for rash.  Neurological:  Negative for weakness.  Endo/Heme/Allergies: Negative.   Psychiatric/Behavioral: Negative.        Objective:   Vitals:   01/02/24 0919  BP: 138/78  Pulse: 65  SpO2: 97%  Weight: 296 lb 9.6 oz (134.5 kg)  Height: 6' (1.829 m)     Physical Exam Constitutional:      General: He is not in acute distress.    Appearance: Normal appearance. He is obese.  Eyes:     General: No scleral icterus.    Conjunctiva/sclera: Conjunctivae normal.  Cardiovascular:     Rate and Rhythm: Normal  rate and regular rhythm.  Pulmonary:     Breath sounds: No wheezing, rhonchi or rales.  Musculoskeletal:     Right lower leg: No edema.     Left lower leg: Edema present.  Skin:    General: Skin is warm and dry.  Neurological:     General: No focal deficit present.    CBC    Component Value Date/Time   WBC 5.5 05/06/2023 0845   RBC 4.28 05/06/2023 0845   HGB 14.5 05/06/2023 0845   HGB 15.3 04/11/2017 1032   HCT 42.3 05/06/2023 0845   HCT 44.9 04/11/2017 1032   PLT 161 05/06/2023 0845   PLT 228 04/11/2017 1032   MCV 98.8 05/06/2023 0845   MCV 97 04/11/2017 1032   MCH 33.9 05/06/2023 0845   MCHC 34.3 05/06/2023 0845   RDW 13.0 05/06/2023 0845   RDW 14.5 04/11/2017 1032   LYMPHSABS 2.0 05/15/2021 1625   LYMPHSABS 1.3 04/11/2017 1032   MONOABS 0.7 05/15/2021 1625   EOSABS 0.0 05/15/2021 1625   EOSABS 0.1 04/11/2017 1032   BASOSABS 0.1 05/15/2021 1625   BASOSABS 0.0 04/11/2017 1032      Latest Ref Rng & Units 07/02/2023    9:18 AM 05/06/2023    8:45 AM 10/31/2022    9:53 AM  BMP  Glucose 70 - 99 mg/dL 899  887  886   BUN 8 - 27 mg/dL 18  12  15    Creatinine 0.76 - 1.27 mg/dL 9.02  9.18  9.04   BUN/Creat Ratio 10 - 24 19   16    Sodium 134 - 144 mmol/L 140  139  140   Potassium 3.5 - 5.2 mmol/L 4.1  4.0  4.5   Chloride 96 - 106 mmol/L 100  101  100   CO2 20 - 29 mmol/L 25  30  29    Calcium 8.6 - 10.2 mg/dL 8.9  9.1  9.9    Chest imaging: CXR 05/06/23 Low  volume film. Similar chronic subsegmental atelectasis or scarring at the lung bases. No pulmonary edema or focal airspace consolidation. No substantial pleural effusion. Cardiopericardial silhouette is at upper limits of normal for size. No acute bony abnormality.  CTA Chest 02/15/23 Mediastinum/Nodes: Patent trachea. No thyroid  mass. Mildly enlarged prevascular lymph node measuring 12 mm, no change. Esophagus within normal limits.   Lungs/Pleura: No acute airspace disease, pleural effusion,  or pneumothorax  PFT:    Latest Ref Rng & Units 06/16/2018   10:43 AM  PFT Results  FVC-Pre L 3.71  P  FVC-Predicted Pre % 78  P  FVC-Post L 3.79  P  FVC-Predicted Post % 80  P  Pre FEV1/FVC % % 77  P  Post FEV1/FCV % % 80  P  FEV1-Pre L 2.85  P  FEV1-Predicted Pre % 82  P  FEV1-Post L 3.04  P  DLCO uncorrected ml/min/mmHg 29.46  P  DLCO UNC% % 107  P  DLVA Predicted % 129  P  TLC L 6.87  P  TLC % Predicted % 92  P  RV % Predicted % 123  P    P Preliminary result    Labs:  Path:  Echo:  Heart Catheterization:     Assessment & Plan:   Mild intermittent asthma without complication  Post-nasal drip  Seasonal allergic rhinitis, unspecified trigger  Impacted cerumen of right ear  Discussion: Todd Chan is a 78 year old male, former smoker with history of Parkinson's disease, GERD, hypertension, glaucoma, coronary artery disease and peripheral vascular disease who returns to pulmonary clinic for asthma.   Mild intermittent asthma - Continue Symbicort inhaler, 2 puffs in the morning and 2 puffs in the evening. - Use albuterol inhaler as needed.  Postnasal drip Allergic Rhinitis - Continue Flonase  nasal spray, 1 spray per nostril daily. - resume ipratropium nasal spray, 2 sprays in the morning and evening for 1-2 weeks, then as needed. - start allegra or zyrtec daily  Gastroesophageal reflux disease (GERD) - Continue pantoprazole  at bedtime. - Continue pepcid as needed - discuss any regimen changes with Dr. Dianna, GI doctor  Headaches Ear Congestion - start zyrtec or allegra daily for ear congestion - continue therapy sessions with chiropractor  Impacted Cerumen, Left ear - refer to primary care for cerumen removal  Follow up in 6 months.  32 minutes spent on this visit  Dorn Chill, MD [facility] Office: [phone]   Current Outpatient Medications:    aspirin  81 MG EC tablet, 1 tablet, Disp: , Rfl:     budesonide-formoterol (SYMBICORT) 80-4.5 MCG/ACT inhaler, Inhale 2 puffs into the lungs daily., Disp: , Rfl:    carbidopa -levodopa  (SINEMET  IR) 25-100 MG tablet, TAKE 1 TABLET 4 TIMES DAILY, Disp: 360 tablet, Rfl: 1   carvedilol  (COREG ) 12.5 MG tablet, Take 1 tablet (12.5 mg total) by mouth 2 (two) times daily., Disp: 180 tablet, Rfl: 3   Cholecalciferol (VITAMIN D3) 50 MCG (2000 UT) capsule, Take 2,000 Units by mouth daily., Disp: , Rfl:    Coenzyme Q10 (CO Q 10) 100 MG CAPS, See admin instructions., Disp: , Rfl:    ezetimibe  (ZETIA ) 10 MG tablet, Take 1 tablet (10 mg total) by mouth daily., Disp: 90 tablet, Rfl: 3   fluticasone  (FLONASE ) 50 MCG/ACT nasal spray, Place 1 spray into both nostrils daily., Disp: 16 g, Rfl: 2   ipratropium (ATROVENT ) 0.03 % nasal spray, Place 2 sprays into both nostrils every 12 (twelve) hours., Disp: 30  mL, Rfl: 12   latanoprost  (XALATAN ) 0.005 % ophthalmic solution, Place 1 drop into the left eye at bedtime., Disp: , Rfl:    Multiple Vitamin (MULTIVITAMIN) capsule, Take 1 capsule by mouth daily., Disp: , Rfl:    Omega-3 Fatty Acids (FISH OIL PO), Take 1,290 mg by mouth daily., Disp: , Rfl:    pantoprazole  (PROTONIX ) 40 MG tablet, 1 tablet, Disp: , Rfl:    pravastatin  (PRAVACHOL ) 20 MG tablet, TAKE 1 TABLET DAILY, Disp: 90 tablet, Rfl: 3   telmisartan -hydrochlorothiazide (MICARDIS  HCT) 80-12.5 MG tablet, TAKE 1 TABLET BY MOUTH EVERY DAY, Disp: 90 tablet, Rfl: 1

## 2024-01-03 ENCOUNTER — Ambulatory Visit: Attending: Neurology

## 2024-01-03 DIAGNOSIS — R29818 Other symptoms and signs involving the nervous system: Secondary | ICD-10-CM | POA: Insufficient documentation

## 2024-01-03 DIAGNOSIS — R2681 Unsteadiness on feet: Secondary | ICD-10-CM | POA: Diagnosis not present

## 2024-01-03 DIAGNOSIS — R293 Abnormal posture: Secondary | ICD-10-CM | POA: Insufficient documentation

## 2024-01-03 DIAGNOSIS — R2689 Other abnormalities of gait and mobility: Secondary | ICD-10-CM | POA: Insufficient documentation

## 2024-01-03 NOTE — Therapy (Signed)
 OUTPATIENT PHYSICAL THERAPY NEURO TREATMENT   Patient Name: Todd Chan MRN: [id number] DOB:Jan 15, 1946, 78 y.o., male Today's Date: 01/03/2024   PCP: Cindy Clotilda CHRISTELLA, DO REFERRING PROVIDER: Hans Cinderella Duck, MD   END OF SESSION:  PT End of Session - 01/03/24 0921     Visit Number 2    Number of Visits 9    Date for Recertification  01/17/24    Authorization Type Medicare/AARP    PT Start Time 0930    PT Stop Time 1015    PT Time Calculation (min) 45 min    Activity Tolerance Patient tolerated treatment well    Behavior During Therapy [facility] for tasks assessed/performed          Past Medical History:  Diagnosis Date   Arthritis    GERD (gastroesophageal reflux disease)    Glaucoma    Hypertension    Osteoarthritis of knee    Painful urination    Peripheral vascular disease    bilateral edema    Past Surgical History:  Procedure Laterality Date   CARDIAC CATHETERIZATION  04/2017   CATARACT EXTRACTION, BILATERAL Bilateral    Left: 10/30/21, right: 11/20/21.   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   TONSILLECTOMY     TOTAL KNEE ARTHROPLASTY Left 10/08/2016   Procedure: LEFT TOTAL KNEE ARTHROPLASTY;  Surgeon: Melodi Lerner, MD;  Location: [facility] ORS;  Service: Orthopedics;  Laterality: Left;   TOTAL KNEE ARTHROPLASTY Right 06/17/2017   Procedure: RIGHT TOTAL KNEE ARTHROPLASTY;   Surgeon: Melodi Lerner, MD;  Location: [facility] ORS;  Service: Orthopedics;  Laterality: Right;   Patient Active Problem List   Diagnosis Date Noted   Parkinson's disease (HCC) 08/02/2021   Barrett's esophagus 07/13/2021   Benign prostatic hyperplasia with lower urinary tract symptoms 07/13/2021   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   Essential tremor 07/13/2021   Gallstones 07/13/2021   Gastro-esophageal reflux disease with esophagitis, without bleeding 07/13/2021   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   History of colonic polyps 07/13/2021   Seasonal allergic rhinitis 07/13/2021   Primary open-angle glaucoma, left eye, mild stage 05/09/2021   Disequilibrium 09/27/2020   Cataract, nuclear sclerotic, both eyes 07/06/2020   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) 07/06/2020   Tremor 01/21/2020   Polyneuropathy 01/21/2020   Stuffy ears, bilateral 01/21/2020   Otalgia of both ears 01/21/2020   Other headache syndrome 01/21/2020   Carotid stenosis, asymptomatic, bilateral 01/21/2020   Episodic lightheadedness 05/28/2019   Ascending aortic aneurysm (HCC) 4.2 cm on CT from January 2020 05/28/2019   Snoring 05/28/2019   Pleurisy 04/10/2018   Abnormal nuclear stress test    Exertional dyspnea    Abnormal myocardial perfusion study 04/11/2017   Chest pain in adult 03/18/2017   Edema 03/18/2017   Mild CAD 03/18/2017   Peripheral vascular disease    Painful urination  Osteoarthritis of knee    Hypertension    Hyperlipidemia    GERD (gastroesophageal reflux disease)    Fibromyalgia    Arthritis    OA (osteoarthritis) of knee 10/08/2016    ONSET DATE: 11/20/2023 (MD referral)  REFERRING DIAG: G20.A1 (ICD-10-CM) - Parkinson's disease without dyskinesia, without mention of fluctuations   THERAPY DIAG:  Unsteadiness on feet  Other abnormalities of gait and mobility  Abnormal posture  Other symptoms and signs involving the  nervous system  Rationale for Evaluation and Treatment: Rehabilitation  SUBJECTIVE:                                                                                                                                                                                             SUBJECTIVE STATEMENT: Doing ok, been busy this week with MD appointments Pt accompanied by: self  PERTINENT HISTORY: HTN, lightheadedness, neck pain, arthritis, GERD, glaucoma, OA knee s/p L TKR 2018 and R TKR 2019, PVD, polyneuropathy   PAIN:  Are you having pain? No pain today; neck pain comes and goes (seeing chiropractor)  PRECAUTIONS: Fall  RED FLAGS: None   WEIGHT BEARING RESTRICTIONS: No  FALLS: Has patient fallen in last 6 months? No  LIVING ENVIRONMENT: Lives with: lives with their spouse Lives in: House/apartment Stairs: 1-2 steps to enter; steps to second floor with handrail Has following equipment at home: Single point cane  PLOF: Independent and Leisure: Using [employer] daily (at home); walks daily  PATIENT GOALS: get movement going again and try to be more active  OBJECTIVE:   TODAY'S TREATMENT: 01/03/24 Activity Comments  NU-step HIIT 2 min warm-up [place] 20 sec fast pace, 10 sec recovery pace performed for 2 min rounds x 2  Standing PWR moves Slow paced rehearsal followed by 10 reps with flow and visual/verbal cues  Static multisensory balance To improve postural control  Runner's gastroc stretch 2x30 sec           Note: Objective measures were completed at Evaluation unless otherwise noted.  DIAGNOSTIC FINDINGS: CT: awaiting results (due to neck and ear pain)  COGNITION: Overall cognitive status: Within functional limits for tasks assessed   SENSATION: Hx of neuropathy  COORDINATION: Tremor L hand; reports changes in handwriting  POSTURE: rounded shoulders and forward head  LOWER EXTREMITY ROM:   WFL  Active  Right Eval Left Eval  Hip flexion    Hip extension    Hip  abduction    Hip adduction    Hip internal rotation    Hip external rotation    Knee flexion    Knee extension    Ankle  dorsiflexion    Ankle plantarflexion    Ankle inversion    Ankle eversion     (Blank rows = not tested)  LOWER EXTREMITY MMT:    MMT Right Eval Left Eval  Hip flexion 5 5  Hip extension    Hip abduction    Hip adduction    Hip internal rotation    Hip external rotation    Knee flexion 5 5  Knee extension 5 5  Ankle dorsiflexion 4+ 4  Ankle plantarflexion    Ankle inversion    Ankle eversion    (Blank rows = not tested)  TRANSFERS: Sit to stand: Modified independence  Assistive device utilized: None     Stand to sit: Modified independence  Assistive device utilized: None      GAIT: Findings: Gait Characteristics: step through pattern, decreased arm swing- Left, decreased step length- Right, and decreased step length- Left, Distance walked: 50 ft, Assistive device utilized:None, and Level of assistance: Modified independence  FUNCTIONAL TESTS:  5 times sit to stand: 10.81 sec Timed up and go (TUG): 10.5 10 meter walk test: 9.44 sec (3.48 ft/sec) Mini-BESTest: 21/28 (<22/28 indicates increased fall risk 3 M walk back:  5.94 sec TUG cognitive:  11.88 sec                                                                                                                                 TREATMENT DATE: 12/18/2023    PATIENT EDUCATION: Education details: Eval results, POC; answered pt's questions about his Nustep positioning, level of intensity and practiced 30 sec intervals of increased intensity (>80-90 SPM) Person educated: Patient and Spouseat end of session Education method: Explanation and Demonstration Education comprehension: verbalized understanding and returned demonstration  HOME EXERCISE PROGRAM: Not yet initiated  GOALS: Goals reviewed with patient? Yes  SHORT TERM GOALS:= LTGs   LONG TERM GOALS: Target date: 01/17/2024  Pt will  be independent with HEP for improved strength, general flexibility, balance, gait per PD Foundation guidelines. Baseline:  Goal status: INITIAL  2.  Pt will improve MiniBESTest score to at least 23/28 to decrease fall risk. Baseline: 21/28 Goal status: INITIAL  3.  Pt will verbalize understanding of local Parkinson's disease community resources, including continued community fitness, to maximize gains made in PT.  Baseline:  Goal status: INITIAL  4.  Pt will improve 5x sit<>stand to less than or equal to 9 sec to demonstrate improved functional strength and transfer efficiency.  Baseline:  10.81 sec  Goal status:  INITIAL    ASSESSMENT:  CLINICAL IMPRESSION: Instructed in HIIT with Nu-step for home performance with instruction in use of interval timer and methodology for speed intervals to improve cardiovascular/endurance fitness and for rapid alternating movement. Standing large amplitude movements to improve coordination and balance to improve safety with mobility w/ verbal/visual cues/feedback. Static multisensory balance to improve postural control and stability for proprioceptive awareness for safety in ADL. Continued sessions  to progress POC details to enhance mobility and reduce fall risk.  OBJECTIVE IMPAIRMENTS: Abnormal gait, decreased balance, decreased mobility, decreased strength, and postural dysfunction.   ACTIVITY LIMITATIONS: locomotion level  PARTICIPATION LIMITATIONS: community activity and fitness  PERSONAL FACTORS: 3+ comorbidities: see above are also affecting patient's functional outcome.   REHAB POTENTIAL: Good  CLINICAL DECISION MAKING: Stable/uncomplicated  EVALUATION COMPLEXITY: Low  PLAN:  PT FREQUENCY: 2x/week  PT DURATION: 4 weeks plus eval  PLANNED INTERVENTIONS: 97750- Physical Performance Testing, 97110-Therapeutic exercises, 97530- Therapeutic activity, [id number]- Neuromuscular re-education, 97535- Self Care, [id number]- Gait training, Patient/Family  education, and Balance training  PLAN FOR NEXT SESSION: PWR! Moves standing, balance reactions forward/back/side, SLS, compliant surface; use of Nustep (pt has one at home)-discuss interval training with this; general stretching, strengthening and discuss plans for trainer (likely at [facility])   Jonette MARLA Sandifer, PT 01/03/2024, 9:21 AM  [facility] at [facility] [address], [address] [place], [place] [address] Phone # [phone] Fax # [phone]

## 2024-01-06 ENCOUNTER — Ambulatory Visit: Admitting: Physical Therapy

## 2024-01-06 ENCOUNTER — Encounter: Payer: Self-pay | Admitting: Physical Therapy

## 2024-01-06 DIAGNOSIS — R29818 Other symptoms and signs involving the nervous system: Secondary | ICD-10-CM | POA: Diagnosis not present

## 2024-01-06 DIAGNOSIS — R2689 Other abnormalities of gait and mobility: Secondary | ICD-10-CM | POA: Diagnosis not present

## 2024-01-06 DIAGNOSIS — R2681 Unsteadiness on feet: Secondary | ICD-10-CM | POA: Diagnosis not present

## 2024-01-06 DIAGNOSIS — R293 Abnormal posture: Secondary | ICD-10-CM | POA: Diagnosis not present

## 2024-01-06 DIAGNOSIS — Z23 Encounter for immunization: Secondary | ICD-10-CM | POA: Diagnosis not present

## 2024-01-06 NOTE — Therapy (Signed)
 OUTPATIENT PHYSICAL THERAPY NEURO TREATMENT   Patient Name: Todd Chan MRN: [id number] DOB:09/07/1945, 78 y.o., male Today's Date: 01/06/2024   PCP: Cindy Clotilda CHRISTELLA, DO REFERRING PROVIDER: Hans Cinderella Duck, MD   END OF SESSION:  PT End of Session - 01/06/24 1324     Visit Number 3    Number of Visits 9    Date for Recertification  01/17/24    Authorization Type Medicare/AARP    PT Start Time 1321    PT Stop Time 1402    PT Time Calculation (min) 41 min    Activity Tolerance Patient tolerated treatment well    Behavior During Therapy [name] for tasks assessed/performed           Past Medical History:  Diagnosis Date   Arthritis    GERD (gastroesophageal reflux disease)    Glaucoma    Hypertension    Osteoarthritis of knee    Painful urination    Peripheral vascular disease    bilateral edema    Past Surgical History:  Procedure Laterality Date   CARDIAC CATHETERIZATION  04/2017   CATARACT EXTRACTION, BILATERAL Bilateral    Left: 10/30/21, right: 11/20/21.   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   TONSILLECTOMY     TOTAL KNEE ARTHROPLASTY Left 10/08/2016   Procedure: LEFT TOTAL KNEE ARTHROPLASTY;  Surgeon: Melodi Lerner, MD;  Location: [facility];  Service: Orthopedics;  Laterality: Left;   TOTAL KNEE ARTHROPLASTY Right 06/17/2017   Procedure: RIGHT TOTAL KNEE  ARTHROPLASTY;  Surgeon: Melodi Lerner, MD;  Location: [facility];  Service: Orthopedics;  Laterality: Right;   Patient Active Problem List   Diagnosis Date Noted   Parkinson's disease (HCC) 08/02/2021   Barrett's esophagus 07/13/2021   Benign prostatic hyperplasia with lower urinary tract symptoms 07/13/2021   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   Essential tremor 07/13/2021   Gallstones 07/13/2021   Gastro-esophageal reflux disease with esophagitis, without bleeding 07/13/2021   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   History of colonic polyps 07/13/2021   Seasonal allergic rhinitis 07/13/2021   Primary open-angle glaucoma, left eye, mild stage 05/09/2021   Disequilibrium 09/27/2020   Cataract, nuclear sclerotic, both eyes 07/06/2020   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) 07/06/2020   Tremor 01/21/2020   Polyneuropathy 01/21/2020   Stuffy ears, bilateral 01/21/2020   Otalgia of both ears 01/21/2020   Other headache syndrome 01/21/2020   Carotid stenosis, asymptomatic, bilateral 01/21/2020   Episodic lightheadedness 05/28/2019   Ascending aortic aneurysm (HCC) 4.2 cm on CT from January 2020 05/28/2019   Snoring 05/28/2019   Pleurisy 04/10/2018   Abnormal nuclear stress test    Exertional dyspnea    Abnormal myocardial perfusion study 04/11/2017   Chest pain in adult 03/18/2017   Edema 03/18/2017   Mild CAD 03/18/2017   Peripheral vascular disease    Painful urination  Osteoarthritis of knee    Hypertension    Hyperlipidemia    GERD (gastroesophageal reflux disease)    Fibromyalgia    Arthritis    OA (osteoarthritis) of knee 10/08/2016    ONSET DATE: 11/20/2023 (MD referral)  REFERRING DIAG: G20.A1 (ICD-10-CM) - Parkinson's disease without dyskinesia, without mention of fluctuations   THERAPY DIAG:  Abnormal posture  Unsteadiness on feet  Other abnormalities of gait and mobility  Rationale for Evaluation  and Treatment: Rehabilitation  SUBJECTIVE:                                                                                                                                                                                             SUBJECTIVE STATEMENT: My neck and back are bothering me more today Pt accompanied by: self  PERTINENT HISTORY: HTN, lightheadedness, neck pain, arthritis, GERD, glaucoma, OA knee s/p L TKR 2018 and R TKR 2019, PVD, polyneuropathy   PAIN:  Are you having pain? Yes: NPRS scale: 6/10 Pain location: neck Pain description: tight, stiff Aggravating factors: sleep positions Relieving factors: unsure *Pt is currently being seen by chiropractor PRECAUTIONS: Fall  RED FLAGS: None   WEIGHT BEARING RESTRICTIONS: No  FALLS: Has patient fallen in last 6 months? No  LIVING ENVIRONMENT: Lives with: lives with their spouse Lives in: House/apartment Stairs: 1-2 steps to enter; steps to second floor with handrail Has following equipment at home: Single point cane  PLOF: Independent and Leisure: Using Nustep daily (at home); walks daily  PATIENT GOALS: get movement going again and try to be more active  OBJECTIVE:    TODAY'S TREATMENT: 01/06/2024 Activity Comments  NuStep, Level 3-4, PWR! Moves interval training, x 10 minutes Rates effort level 7-8/10  Neck and postural stretch-SNAG stretch with towel-extension, rotation Some difficulty with positioning towel due to LUE tremor  Seated chin tuck, neck retraction with towel Good form, good stretch  Standing low back stretch-L stretch at outside treadmill bar Good stretch  Seated ex: Marching in place LAQ Ankle pumps 2 x 10, attempted coordinating upper body motions, pt tends to go same arm/same leg  Sit to stand, 5 reps x 3 sets EO/EC/EO on compliant surface  Standing on Airex:  feet apart/feet together EO/EC  More sway with EC   Neck ROM: Flexion 54 Extension 35 Rotation R and L 60   Access Code:  [email or web address] URL: [email or web address] Date: 01/06/2024 Prepared by: [facility] - Outpatient  Rehab - [facility]  Exercises - Standing posture stretch  - 1-2 x daily - 7 x weekly - 1 sets - 3-5 reps - 15-30 sec hold - Seated Chin  Tuck with Neck Elongation  - 1-2 x daily - 7 x weekly - 1 sets - 5 reps - 5-10 sec hold  PATIENT EDUCATION: Education details: HEP updates Person educated: Patient Education method: Explanation, Demonstration, and Handouts Education comprehension: verbalized understanding, returned demonstration, and needs further education  ---------------------------------------------------------------------  Note: Objective measures were completed at Evaluation unless otherwise noted.  DIAGNOSTIC FINDINGS: CT: awaiting results (due to neck and ear pain)  COGNITION: Overall cognitive status: Within functional limits for tasks assessed   SENSATION: Hx of neuropathy  COORDINATION: Tremor L hand; reports changes in handwriting  POSTURE: rounded shoulders and forward head  LOWER EXTREMITY ROM:   WFL  Active  Right Eval Left Eval  Hip flexion    Hip extension    Hip abduction    Hip adduction    Hip internal rotation    Hip external rotation    Knee flexion    Knee extension    Ankle dorsiflexion    Ankle plantarflexion    Ankle inversion    Ankle eversion     (Blank rows = not tested)  LOWER EXTREMITY MMT:    MMT Right Eval Left Eval  Hip flexion 5 5  Hip extension    Hip abduction    Hip adduction    Hip internal rotation    Hip external rotation    Knee flexion 5 5  Knee extension 5 5  Ankle dorsiflexion 4+ 4  Ankle plantarflexion    Ankle inversion    Ankle eversion    (Blank rows = not tested)  TRANSFERS: Sit to stand: Modified independence  Assistive device utilized: None     Stand to sit: Modified independence  Assistive device utilized: None      GAIT: Findings: Gait Characteristics: step through pattern, decreased  arm swing- Left, decreased step length- Right, and decreased step length- Left, Distance walked: 50 ft, Assistive device utilized:None, and Level of assistance: Modified independence  FUNCTIONAL TESTS:  5 times sit to stand: 10.81 sec Timed up and go (TUG): 10.5 10 meter walk test: 9.44 sec (3.48 ft/sec) Mini-BESTest: 21/28 (<22/28 indicates increased fall risk 3 M walk back:  5.94 sec TUG cognitive:  11.88 sec                                                                                                                                  GOALS: Goals reviewed with patient? Yes  SHORT TERM GOALS:= LTGs   LONG TERM GOALS: Target date: 01/17/2024  Pt will be independent with HEP for improved strength, general flexibility, balance, gait per PD Foundation guidelines. Baseline:  Goal status: IN PROGRESS  2.  Pt will improve MiniBESTest score to at least 23/28 to decrease fall risk. Baseline: 21/28 Goal status: IN PROGRESS  3.  Pt will verbalize understanding of local Parkinson's disease community resources, including continued community fitness, to maximize gains made in PT.  Baseline:  Goal  status: IN PROGRESS  4.  Pt will improve 5x sit<>stand to less than or equal to 9 sec to demonstrate improved functional strength and transfer efficiency.  Baseline:  10.81 sec  Goal status:  IN PROGRESS    ASSESSMENT:  CLINICAL IMPRESSION: Pt presents today with reports of stiffness, tightness in neck/back. Skilled PT session focused on aerobic warm up with PWR! Moves interval training option, with pt reporting 7-8/10 effort level.  Worked on postural stretches, sit to stand and multi-sensory balance exercises; pt reports at end of session that his pain is much better.  Provided several postural stretching exercises.  He does have unsteadiness with EC on compliant surface and will continue to benefit from further work on balance.     OBJECTIVE IMPAIRMENTS: Abnormal gait, decreased  balance, decreased mobility, decreased strength, and postural dysfunction.   ACTIVITY LIMITATIONS: locomotion level  PARTICIPATION LIMITATIONS: community activity and fitness  PERSONAL FACTORS: 3+ comorbidities: see above are also affecting patient's functional outcome.   REHAB POTENTIAL: Good  CLINICAL DECISION MAKING: Stable/uncomplicated  EVALUATION COMPLEXITY: Low  PLAN:  PT FREQUENCY: 2x/week  PT DURATION: 4 weeks plus eval  PLANNED INTERVENTIONS: 97750- Physical Performance Testing, 97110-Therapeutic exercises, 97530- Therapeutic activity, [id number]- Neuromuscular re-education, 97535- Self Care, [id number]- Gait training, Patient/Family education, and Balance training  PLAN FOR NEXT SESSION: Review stretch updates to HEP. Work on balance reactions forward/back/side, SLS, compliant surface; use of Nustep (pt has one at home)-discuss interval training with this; general stretching, strengthening and discuss plans for trainer (likely at [facility])   STARLET GREIG ORN., PT 01/06/2024, 4:34 PM  [facility] at [facility] [address], [address] [place], [place] [address] Phone # [phone] Fax # [phone]

## 2024-01-07 DIAGNOSIS — Z860101 Personal history of adenomatous and serrated colon polyps: Secondary | ICD-10-CM | POA: Diagnosis not present

## 2024-01-07 DIAGNOSIS — K219 Gastro-esophageal reflux disease without esophagitis: Secondary | ICD-10-CM | POA: Diagnosis not present

## 2024-01-07 DIAGNOSIS — K2270 Barrett's esophagus without dysplasia: Secondary | ICD-10-CM | POA: Diagnosis not present

## 2024-01-08 ENCOUNTER — Emergency Department (HOSPITAL_BASED_OUTPATIENT_CLINIC_OR_DEPARTMENT_OTHER): Admitting: Radiology

## 2024-01-08 ENCOUNTER — Emergency Department (HOSPITAL_BASED_OUTPATIENT_CLINIC_OR_DEPARTMENT_OTHER)
Admission: EM | Admit: 2024-01-08 | Discharge: 2024-01-08 | Disposition: A | Discharge status: Home / Self Care | Attending: Emergency Medicine | Admitting: Emergency Medicine

## 2024-01-08 ENCOUNTER — Encounter: Payer: Self-pay | Admitting: Physical Therapy

## 2024-01-08 ENCOUNTER — Ambulatory Visit: Admitting: Physical Therapy

## 2024-01-08 ENCOUNTER — Encounter: Payer: Self-pay | Admitting: Cardiology

## 2024-01-08 ENCOUNTER — Emergency Department (HOSPITAL_BASED_OUTPATIENT_CLINIC_OR_DEPARTMENT_OTHER)

## 2024-01-08 ENCOUNTER — Encounter (HOSPITAL_BASED_OUTPATIENT_CLINIC_OR_DEPARTMENT_OTHER): Payer: Self-pay | Admitting: Emergency Medicine

## 2024-01-08 ENCOUNTER — Other Ambulatory Visit: Payer: Self-pay

## 2024-01-08 ENCOUNTER — Encounter: Payer: Self-pay | Admitting: Pulmonary Disease

## 2024-01-08 DIAGNOSIS — H6121 Impacted cerumen, right ear: Secondary | ICD-10-CM | POA: Insufficient documentation

## 2024-01-08 DIAGNOSIS — R079 Chest pain, unspecified: Secondary | ICD-10-CM | POA: Diagnosis not present

## 2024-01-08 DIAGNOSIS — R2681 Unsteadiness on feet: Secondary | ICD-10-CM | POA: Diagnosis not present

## 2024-01-08 DIAGNOSIS — K8020 Calculus of gallbladder without cholecystitis without obstruction: Secondary | ICD-10-CM | POA: Diagnosis not present

## 2024-01-08 DIAGNOSIS — Z79899 Other long term (current) drug therapy: Secondary | ICD-10-CM | POA: Insufficient documentation

## 2024-01-08 DIAGNOSIS — I2510 Atherosclerotic heart disease of native coronary artery without angina pectoris: Secondary | ICD-10-CM | POA: Diagnosis not present

## 2024-01-08 DIAGNOSIS — I10 Essential (primary) hypertension: Secondary | ICD-10-CM | POA: Insufficient documentation

## 2024-01-08 DIAGNOSIS — G20C Parkinsonism, unspecified: Secondary | ICD-10-CM | POA: Diagnosis not present

## 2024-01-08 DIAGNOSIS — Z7982 Long term (current) use of aspirin: Secondary | ICD-10-CM | POA: Insufficient documentation

## 2024-01-08 DIAGNOSIS — R2689 Other abnormalities of gait and mobility: Secondary | ICD-10-CM | POA: Diagnosis not present

## 2024-01-08 DIAGNOSIS — F419 Anxiety disorder, unspecified: Secondary | ICD-10-CM | POA: Diagnosis not present

## 2024-01-08 DIAGNOSIS — K760 Fatty (change of) liver, not elsewhere classified: Secondary | ICD-10-CM | POA: Diagnosis not present

## 2024-01-08 DIAGNOSIS — I719 Aortic aneurysm of unspecified site, without rupture: Secondary | ICD-10-CM

## 2024-01-08 DIAGNOSIS — R42 Dizziness and giddiness: Secondary | ICD-10-CM | POA: Diagnosis not present

## 2024-01-08 DIAGNOSIS — R293 Abnormal posture: Secondary | ICD-10-CM | POA: Diagnosis not present

## 2024-01-08 DIAGNOSIS — R29818 Other symptoms and signs involving the nervous system: Secondary | ICD-10-CM | POA: Diagnosis not present

## 2024-01-08 DIAGNOSIS — R0789 Other chest pain: Secondary | ICD-10-CM | POA: Diagnosis present

## 2024-01-08 DIAGNOSIS — G9389 Other specified disorders of brain: Secondary | ICD-10-CM | POA: Diagnosis not present

## 2024-01-08 DIAGNOSIS — I7121 Aneurysm of the ascending aorta, without rupture: Secondary | ICD-10-CM | POA: Diagnosis not present

## 2024-01-08 LAB — CBC WITH DIFFERENTIAL/PLATELET
Abs Immature Granulocytes: 0.02 K/uL (ref 0.00–0.07)
Basophils Absolute: 0 K/uL (ref 0.0–0.1)
Basophils Relative: 1 %
Eosinophils Absolute: 0.1 K/uL (ref 0.0–0.5)
Eosinophils Relative: 1 %
HCT: 38.6 % — ABNORMAL LOW (ref 39.0–52.0)
Hemoglobin: 13.5 g/dL (ref 13.0–17.0)
Immature Granulocytes: 0 %
Lymphocytes Relative: 25 %
Lymphs Abs: 1.7 K/uL (ref 0.7–4.0)
MCH: 34.5 pg — ABNORMAL HIGH (ref 26.0–34.0)
MCHC: 35 g/dL (ref 30.0–36.0)
MCV: 98.7 fL (ref 80.0–100.0)
Monocytes Absolute: 0.7 K/uL (ref 0.1–1.0)
Monocytes Relative: 11 %
Neutro Abs: 4.3 K/uL (ref 1.7–7.7)
Neutrophils Relative %: 62 %
Platelets: 174 K/uL (ref 150–400)
RBC: 3.91 MIL/uL — ABNORMAL LOW (ref 4.22–5.81)
RDW: 13.4 % (ref 11.5–15.5)
WBC: 6.9 K/uL (ref 4.0–10.5)
nRBC: 0 % (ref 0.0–0.2)

## 2024-01-08 LAB — BASIC METABOLIC PANEL WITH GFR
Anion gap: 12 (ref 5–15)
BUN: 16 mg/dL (ref 8–23)
CO2: 26 mmol/L (ref 22–32)
Calcium: 9.5 mg/dL (ref 8.9–10.3)
Chloride: 95 mmol/L — ABNORMAL LOW (ref 98–111)
Creatinine, Ser: 1.09 mg/dL (ref 0.61–1.24)
GFR, Estimated: >60 mL/min (ref >60)
Glucose, Bld: 117 mg/dL — ABNORMAL HIGH (ref 70–99)
Potassium: 4.6 mmol/L (ref 3.5–5.1)
Sodium: 134 mmol/L — ABNORMAL LOW (ref 135–145)

## 2024-01-08 LAB — D-DIMER, QUANTITATIVE: D-Dimer, Quant: 0.48 ug{FEU}/mL (ref 0.00–0.50)

## 2024-01-08 LAB — TROPONIN T, HIGH SENSITIVITY: Troponin T High Sensitivity: 16 ng/L (ref 0–19)

## 2024-01-08 MED ORDER — IOHEXOL 350 MG/ML SOLN
100.0000 mL | Freq: Once | INTRAVENOUS | Status: AC | PRN
  Administered 2024-01-08: 100 mL via INTRAVENOUS

## 2024-01-08 MED ORDER — KETOROLAC TROMETHAMINE 15 MG/ML IJ SOLN
15.0000 mg | Freq: Once | INTRAMUSCULAR | Status: AC
  Administered 2024-01-08: 15 mg via INTRAVENOUS
  Filled 2024-01-08: qty 1

## 2024-01-08 MED ORDER — SIMETHICONE 40 MG/0.6ML PO SUSP (UNIT DOSE)
160.0000 mg | Freq: Once | ORAL | Status: AC
  Administered 2024-01-08: 160 mg via ORAL
  Filled 2024-01-08: qty 2.4

## 2024-01-08 NOTE — ED Provider Notes (Signed)
 [facility] AT [facility] Provider Note   CSN: [id number] Arrival date & time: 01/08/24  1206     Patient presents with: Hypertension   Todd Chan is a 78 y.o. male past medical history significant for Parkinson's, hypertension, GERD, peripheral vascular disease, fibromyalgia, and CAD presents today for elevated blood pressure while at PT.  Patient reporting chest tightness, nausea, and lightheadedness for the past few days.  Patient does have chronic back and neck pain which he states sometimes does cause similar pain in his chest area, as well as GERD.  Patient denies vomiting, fever, chills, cough, congestion, shortness of breath, leg swelling, headache, double vision, or tinnitus.    Hypertension       Prior to Admission medications   Medication Sig Start Date End Date Taking? Authorizing Provider  aspirin  81 MG EC tablet 1 tablet    [provider]  budesonide-formoterol (SYMBICORT) 80-4.5 MCG/ACT inhaler Inhale 2 puffs into the lungs daily. 06/28/23   [provider]  carbidopa -levodopa  (SINEMET  IR) 25-100 MG tablet TAKE 1 TABLET 4 TIMES DAILY 08/05/23   Sater, Charlie LABOR, MD  carvedilol  (COREG ) 12.5 MG tablet Take 1 tablet (12.5 mg total) by mouth 2 (two) times daily. 03/18/23   Tobb, Kardie, DO  Cholecalciferol (VITAMIN D3) 50 MCG (2000 UT) capsule Take 2,000 Units by mouth daily.    [provider]  Coenzyme Q10 (CO Q 10) 100 MG CAPS See admin instructions.    [provider]  ezetimibe  (ZETIA ) 10 MG tablet Take 1 tablet (10 mg total) by mouth daily. 07/18/23 01/02/24  Tobb, Kardie, DO  fluticasone  (FLONASE ) 50 MCG/ACT nasal spray Place 1 spray into both nostrils daily. 08/14/23   Kara Dorn NOVAK, MD  ipratropium (ATROVENT ) 0.03 % nasal spray Place 2 sprays into both nostrils every 12 (twelve) hours. 08/14/23   Kara Dorn NOVAK, MD  latanoprost  (XALATAN ) 0.005 % ophthalmic solution Place 1 drop into the left  eye at bedtime.    [provider]  Multiple Vitamin (MULTIVITAMIN) capsule Take 1 capsule by mouth daily.    [provider]  Omega-3 Fatty Acids (FISH OIL PO) Take 1,290 mg by mouth daily.    [provider]  pantoprazole  (PROTONIX ) 40 MG tablet 1 tablet    [provider]  pravastatin  (PRAVACHOL ) 20 MG tablet TAKE 1 TABLET DAILY 01/01/23   Tobb, Kardie, DO  telmisartan -hydrochlorothiazide (MICARDIS  HCT) 80-12.5 MG tablet TAKE 1 TABLET BY MOUTH EVERY DAY 12/03/23   Tobb, Kardie, DO    Allergies: Patient has no known allergies.    Review of Systems  Respiratory:  Positive for chest tightness.   Gastrointestinal:  Positive for nausea.  Neurological:  Positive for light-headedness.    Updated Vital Signs BP (!) 168/92   Pulse 84   Temp 98 F (36.7 C) (Oral)   Resp 20   SpO2 97%   Physical Exam Vitals and nursing note reviewed.  Constitutional:      General: He is not in acute distress.    Appearance: Normal appearance. He is well-developed. He is not toxic-appearing.     Comments: Patient tremulous on left greater than right, consistent with Parkinson's  HENT:     Head: Normocephalic and atraumatic.     Right Ear: There is impacted cerumen.     Left Ear: Ear canal normal.  Eyes:     Extraocular Movements: Extraocular movements intact.     Conjunctiva/sclera: Conjunctivae normal.     Pupils:  Pupils are equal, round, and reactive to light.  Cardiovascular:     Rate and Rhythm: Normal rate and regular rhythm.     Pulses: Normal pulses.     Heart sounds: Normal heart sounds. No murmur heard. Pulmonary:     Effort: Pulmonary effort is normal. No respiratory distress.     Breath sounds: Normal breath sounds.  Abdominal:     Palpations: Abdomen is soft.     Tenderness: There is no abdominal tenderness.  Musculoskeletal:        General: No swelling.     Cervical back: Neck supple.     Right lower leg: No edema.     Left lower leg: No edema.   Skin:    General: Skin is warm and dry.     Capillary Refill: Capillary refill takes less than 2 seconds.  Neurological:     Mental Status: He is alert. Mental status is at baseline.  Psychiatric:        Mood and Affect: Mood is anxious.     (all labs ordered are listed, but only abnormal results are displayed) Labs Reviewed  BASIC METABOLIC PANEL WITH GFR - Abnormal; Notable for the following components:      Result Value   Sodium 134 (*)    Chloride 95 (*)    Glucose, Bld 117 (*)    All other components within normal limits  CBC WITH DIFFERENTIAL/PLATELET - Abnormal; Notable for the following components:   RBC 3.91 (*)    HCT 38.6 (*)    MCH 34.5 (*)    All other components within normal limits  D-DIMER, QUANTITATIVE  TROPONIN T, HIGH SENSITIVITY  TROPONIN T, HIGH SENSITIVITY    EKG: EKG Interpretation Date/Time:  Wednesday January 08 2024 12:13:53 EDT Ventricular Rate:  80 PR Interval:    QRS Duration:  169 QT Interval:  435 QTC Calculation: 502 R Axis:   23  Text Interpretation: probable sinus with artifact Right bundle branch block Confirmed by Towana Sharper [phone]) on 01/08/2024 12:23:04 PM  Radiology: CT Head Wo Contrast Result Date: 01/08/2024 CLINICAL DATA:  Lightheaded. EXAM: CT HEAD WITHOUT CONTRAST TECHNIQUE: Contiguous axial images were obtained from the base of the skull through the vertex without intravenous contrast. RADIATION DOSE REDUCTION: This exam was performed according to the departmental dose-optimization program which includes automated exposure control, adjustment of the mA and/or kV according to patient size and/or use of iterative reconstruction technique. COMPARISON:  December 13, 2023 FINDINGS: Brain: There is generalized cerebral atrophy with widening of the extra-axial spaces and ventricular dilatation. There are areas of decreased attenuation within the white matter tracts of the supratentorial brain, consistent with microvascular disease  changes. Vascular: No hyperdense vessel or unexpected calcification. Skull: Normal. Negative for fracture or focal lesion. Sinuses/Orbits: No acute finding. Other: None. IMPRESSION: 1. Generalized cerebral atrophy and microvascular disease changes of the supratentorial brain. 2. No acute intracranial abnormality. Electronically Signed   By: Suzen Dials M.D.   On: 01/08/2024 14:31   CT Angio Chest/Abd/Pel for Dissection W and/or Wo Contrast Result Date: 01/08/2024 CLINICAL DATA:  Acute aortic syndrome suspected EXAM: CT ANGIOGRAPHY CHEST, ABDOMEN AND PELVIS TECHNIQUE: Non-contrast CT of the chest was initially obtained. Multidetector CT imaging through the chest, abdomen and pelvis was performed using the standard protocol during bolus administration of intravenous contrast. Multiplanar reconstructed images and MIPs were obtained and reviewed to evaluate the vascular anatomy. RADIATION DOSE REDUCTION: This exam was performed according to the departmental dose-optimization program  which includes automated exposure control, adjustment of the mA and/or kV according to patient size and/or use of iterative reconstruction technique. CONTRAST:  OMNIPAQUE  IOHEXOL  350 MG/ML SOLN COMPARISON:  02/15/2023 FINDINGS: CTA CHEST FINDINGS VASCULAR Aorta: Satisfactory opacification of the aorta. Enlargement of the tubular ascending thoracic aorta measuring up to 4.1 x 4.0 cm. Enlargement of the descending thoracic aorta measuring up to 3.3 x 3.2 cm. Mild atherosclerosis. No evidence of dissection or other acute findings. Cardiovascular: No evidence of pulmonary embolism on limited non-tailored examination. Normal heart size. Left coronary artery calcifications. No pericardial effusion. Review of the MIP images confirms the above findings. NON VASCULAR Mediastinum/Nodes: No enlarged mediastinal, hilar, or axillary lymph nodes. Thyroid  gland, trachea, and esophagus demonstrate no significant findings. Lungs/Pleura:  Scarring or atelectasis of the bilateral lung bases with elevation of both hemidiaphragms. No pleural effusion or pneumothorax. Musculoskeletal: Bilateral gynecomastia.  No acute osseous findings. Review of the MIP images confirms the above findings. CTA ABDOMEN AND PELVIS FINDINGS VASCULAR Normal contour and caliber of the abdominal aorta. No evidence of aneurysm, dissection, or other acute aortic pathology. Standard branching pattern of the abdominal aorta with solitary bilateral renal arteries. Mild aortic atherosclerosis Review of the MIP images confirms the above findings. NON-VASCULAR Hepatobiliary: No solid liver abnormality is seen. Hepatic steatosis. Gallstones. No gallbladder wall thickening, or biliary dilatation. Pancreas: Unremarkable. No pancreatic ductal dilatation or surrounding inflammatory changes. Spleen: Normal in size without significant abnormality. Adrenals/Urinary Tract: Adrenal glands are unremarkable. Kidneys are normal, without renal calculi, solid lesion, or hydronephrosis. Bladder is unremarkable. Stomach/Bowel: Stomach is within normal limits. Appendix appears normal. No evidence of bowel wall thickening, distention, or inflammatory changes. Lymphatic: No enlarged abdominal or pelvic lymph nodes. Reproductive: No mass or other significant abnormality. Other: Small fat containing bilateral inguinal hernias.  No ascites. Musculoskeletal: No acute osseous findings. IMPRESSION: 1. No evidence of aortic dissection or other acute aortic pathology. Mild thoracic and abdominal aortic atherosclerosis. 2. Enlargement of the tubular ascending thoracic aorta measuring up to 4.1 x 4.0 cm. Enlargement of the descending thoracic aorta measuring up to 3.3 x 3.2 cm. Recommend annual imaging follow-up by CTA or MRA if not otherwise imaged and if clinically appropriate. 3. Coronary artery disease. 4. Hepatic steatosis. 5. Cholelithiasis. Aortic Atherosclerosis (ICD10-I70.0). Electronically Signed   By:  Marolyn JONETTA Jaksch M.D.   On: 01/08/2024 14:22   DG Chest 2 View Result Date: 01/08/2024 CLINICAL DATA:  Chest pain. EXAM: CHEST - 2 VIEW COMPARISON:  May 06, 2023. FINDINGS: Stable cardiomediastinal silhouette. Hypoinflation of the lungs is noted with stable bibasilar subsegmental atelectasis or scarring. Bony thorax is unremarkable. IMPRESSION: Hypoinflation of the lungs with stable bibasilar subsegmental atelectasis or scarring. Electronically Signed   By: Lynwood Landy Raddle M.D.   On: 01/08/2024 13:21     Procedures   Medications Ordered in the ED  simethicone (MYLICON) 40 mg/0.82ml suspension 160 mg (has no administration in time range)  ketorolac  (TORADOL ) 15 MG/ML injection 15 mg (15 mg Intravenous Given 01/08/24 1245)  iohexol  (OMNIPAQUE ) 350 MG/ML injection 100 mL (100 mLs Intravenous Contrast Given 01/08/24 1411)                                    Medical Decision Making Amount and/or Complexity of Data Reviewed Labs: ordered. Radiology: ordered.  Risk Prescription drug management.   This patient presents to the ED for concern of HTN, nausea, chest  tightness, ligthheadedness this involves an extensive number of treatment options, and is a complaint that carries with it a high risk of complications and morbidity.  The differential diagnosis includes hypertension, hypertensive emergency, PE, arrhythmia, anxiety, GERD, dissection   Co morbidities / Chronic conditions that complicate the patient evaluation  Parkinson's, hypertension, GERD, peripheral vascular disease, fibromyalgia, and CAD   Additional history obtained:  Additional history obtained from EMR External records from outside source obtained and reviewed including PT and Care Everywhere   Lab Tests:  I Ordered, and personally interpreted labs.  The pertinent results include:  CBC with reduced RBCs at 3.91, D-dimer at 0.48, BMP with mild hyponatremia 134, mild hypochloremia 95, mildly elevated glucose at 117,  troponin 16   Imaging Studies ordered:  I ordered imaging studies including CXR  I independently visualized and interpreted imaging which showed hypoinflation of lungs with stable bibasilar subsegmental atelectasis or scarring I agree with the radiologist interpretation CT head Noncon: Generalized cerebral atrophy and microvascular disease changes of the supratentorial brain.  No acute intracranial abnormality CTA dissection study: No evidence of aortic dissection or other acute aortic pathology.  Mild thoracic and abdominal aortic arthrosclerosis.  Enlargement of the tubular ascending thoracic aorta measuring up to 4.1 x 4.0 cm.  Enlargement of the descending thoracic aorta measuring up to 3.3 x 3.2 cm.  Recommend an annual imaging follow follow-up by CTA or MRA.   Cardiac Monitoring: / EKG:  The patient was maintained on a cardiac monitor.  I personally viewed and interpreted the cardiac monitored which showed an underlying rhythm of: Sinus with artifact, RBBB   Problem List / ED Course / Critical interventions / Medication management I ordered medication including Gas-X I have reviewed the patients home medicines and have made adjustments as needed   Test / Admission - Considered:  Considered for admission and further workup however patient's vital signs, physical exam, labs, and imaging and been reassuring.  Patient's workup was not concerning for PE, dissection, hypertensive emergency, or other emergent pathology.  Patient is hypertensive while in ED and advised to follow-up with his cardiologist as soon as possible for medication adjustment and further evaluation.  Patient given return precautions.  I feel patient is safe for discharge at this time.     Final diagnoses:  Hypertension, unspecified type    ED Discharge Orders     None          Francis Ileana SAILOR, PA-C 01/08/24 1456    Towana Ozell BROCKS, MD 01/08/24 1750

## 2024-01-08 NOTE — Discharge Instructions (Addendum)
 Today you were seen for hypertension.  Your workup while in the emergency department was reassuring.  Please follow-up with your cardiologist for further evaluation and treatment.  Please return to the ED if you have worsening chest pain, numbness, weakness, or vision changes.  Thank you for letting us  treat you today. After reviewing your labs and imaging, I feel you are safe to go home. Please follow up with your PCP in the next several days and provide them with your records from this visit. Return to the Emergency Room if pain becomes severe or symptoms worsen.

## 2024-01-08 NOTE — ED Notes (Signed)
 ED Provider at bedside.

## 2024-01-08 NOTE — Therapy (Signed)
 OUTPATIENT PHYSICAL THERAPY NEURO TREATMENT   Patient Name: Todd Chan MRN: [id number] DOB:January 15, 1946, 78 y.o., male Today's Date: 01/08/2024   PCP: Cindy Clotilda CHRISTELLA, DO REFERRING PROVIDER: Hans Cinderella Duck, MD   END OF SESSION:  PT End of Session - 01/08/24 1101     Visit Number 4    Number of Visits 9    Date for Recertification  01/17/24    Authorization Type Medicare/AARP    PT Start Time 1104    PT Stop Time 1147    PT Time Calculation (min) 43 min    Activity Tolerance Patient tolerated treatment well    Behavior During Therapy WFL for tasks assessed/performed            Past Medical History:  Diagnosis Date   Arthritis    GERD (gastroesophageal reflux disease)    Glaucoma    Hypertension    Osteoarthritis of knee    Painful urination    Peripheral vascular disease    bilateral edema    Past Surgical History:  Procedure Laterality Date   CARDIAC CATHETERIZATION  04/2017   CATARACT EXTRACTION, BILATERAL Bilateral    Left: 10/30/21, right: 11/20/21.   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   TONSILLECTOMY     TOTAL KNEE ARTHROPLASTY Left 10/08/2016   Procedure: LEFT TOTAL KNEE ARTHROPLASTY;  Surgeon: Melodi Lerner, MD;  Location: [facility];  Service: Orthopedics;  Laterality: Left;   TOTAL KNEE ARTHROPLASTY Right 06/17/2017   Procedure: RIGHT TOTAL KNEE  ARTHROPLASTY;  Surgeon: Melodi Lerner, MD;  Location: [facility];  Service: Orthopedics;  Laterality: Right;   Patient Active Problem List   Diagnosis Date Noted   Parkinson's disease (HCC) 08/02/2021   Barrett's esophagus 07/13/2021   Benign prostatic hyperplasia with lower urinary tract symptoms 07/13/2021   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   Essential tremor 07/13/2021   Gallstones 07/13/2021   Gastro-esophageal reflux disease with esophagitis, without bleeding 07/13/2021   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   History of colonic polyps 07/13/2021   Seasonal allergic rhinitis 07/13/2021   Primary open-angle glaucoma, left eye, mild stage 05/09/2021   Disequilibrium 09/27/2020   Cataract, nuclear sclerotic, both eyes 07/06/2020   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) 07/06/2020   Tremor 01/21/2020   Polyneuropathy 01/21/2020   Stuffy ears, bilateral 01/21/2020   Otalgia of both ears 01/21/2020   Other headache syndrome 01/21/2020   Carotid stenosis, asymptomatic, bilateral 01/21/2020   Episodic lightheadedness 05/28/2019   Ascending aortic aneurysm (HCC) 4.2 cm on CT from January 2020 05/28/2019   Snoring 05/28/2019   Pleurisy 04/10/2018   Abnormal nuclear stress test    Exertional dyspnea    Abnormal myocardial perfusion study 04/11/2017   Chest pain in adult 03/18/2017   Edema 03/18/2017   Mild CAD 03/18/2017   Peripheral vascular disease    Painful urination  Osteoarthritis of knee    Hypertension    Hyperlipidemia    GERD (gastroesophageal reflux disease)    Fibromyalgia    Arthritis    OA (osteoarthritis) of knee 10/08/2016    ONSET DATE: 11/20/2023 (MD referral)  REFERRING DIAG: G20.A1 (ICD-10-CM) - Parkinson's disease without dyskinesia, without mention of fluctuations   THERAPY DIAG:  Abnormal posture  Unsteadiness on feet  Other abnormalities of gait and mobility  Rationale for Evaluation  and Treatment: Rehabilitation  SUBJECTIVE:                                                                                                                                                                                             SUBJECTIVE STATEMENT: Still wish the neck was feeling better-not so good today. Pt accompanied by: self  PERTINENT HISTORY: HTN, lightheadedness, neck pain, arthritis, GERD, glaucoma, OA knee s/p L TKR 2018 and R TKR 2019, PVD, polyneuropathy   PAIN:  Are you having pain? Yes: NPRS scale: not a lot right now/10 Pain location: neck Pain description: tight, stiff Aggravating factors: sleep positions Relieving factors: unsure *Pt is currently being seen by chiropractor PRECAUTIONS: Fall  RED FLAGS: None   WEIGHT BEARING RESTRICTIONS: No  FALLS: Has patient fallen in last 6 months? No  LIVING ENVIRONMENT: Lives with: lives with their spouse Lives in: House/apartment Stairs: 1-2 steps to enter; steps to second floor with handrail Has following equipment at home: Single point cane  PLOF: Independent and Leisure: Using [employer] daily (at home); walks daily  PATIENT GOALS: get movement going again and try to be more active  OBJECTIVE:    TODAY'S TREATMENT: 01/08/2024 Activity Comments  Review of stretches Seated neck rotation Seated lateral neck flexion stretch Standing neck retraction against towel at wall Good return demo-cues to lessen stretch and not cause pain  Occasional pain in L neck muscles  Seated ex: Marching in place LAQ Ankle pumps Side step out and in  2 x 10 to help offset stiffness after prolonged sitting  Heel raises Heel toe raises   Forward/back step and weightshift Sidestep and weightshift   SLS with step taps, hovering above step, 2 x 5 reps each Light UE support  Pt c/o nausea, lightheadedness which resolves with seated rest 168/110; manual (difficult to read bottom number due to tremor); again taken with auto cuff  194/121 181/104  Access Code: [email or web address] (DID NOT PROVIDE AT END OF SESSION DUE TO BP MEASURES) URL: [email or web address] Date: 01/08/2024 Prepared by: [facility] - Outpatient  Rehab - [facility]  Exercises - Standing posture stretch  - 1-2 x daily - 7 x  weekly - 1 sets - 3-5 reps - 15-30 sec hold - Seated Chin Tuck with Neck Elongation  - 1-2 x daily - 7 x weekly - 1 sets - 5 reps - 5-10 sec hold - Alternating Step Backward with Support  - 1 x daily - 7 x weekly - 2 sets - 10 reps - Step Sideways  - 1 x daily - 7 x weekly - 2 sets - 10 reps - Alternating Step forward-Balance Reaction  - 1 x daily - 7 x weekly - 2 sets - 10 reps  PATIENT EDUCATION: Education details: HEP updates (did not provide due to BP measures); educated about signs/symptoms of HTN and when this would warrant ED visit/911; pt and wife aware Person educated: Patient and Spouse Education method: Explanation and Demonstration Education comprehension: verbalized understanding  ---------------------------------------------------------------------  Note: Objective measures were completed at Evaluation unless otherwise noted.  DIAGNOSTIC FINDINGS: CT: awaiting results (due to neck and ear pain)  COGNITION: Overall cognitive status: Within functional limits for tasks assessed   SENSATION: Hx of neuropathy  COORDINATION: Tremor L hand; reports changes in handwriting  POSTURE: rounded shoulders and forward head  LOWER EXTREMITY ROM:   WFL  Active  Right Eval Left Eval  Hip flexion    Hip extension    Hip abduction    Hip adduction    Hip internal rotation    Hip external rotation    Knee flexion    Knee extension    Ankle dorsiflexion    Ankle plantarflexion    Ankle inversion    Ankle eversion     (Blank rows = not tested)  LOWER EXTREMITY MMT:    MMT Right Eval Left Eval  Hip flexion 5 5  Hip extension    Hip abduction    Hip adduction    Hip internal rotation    Hip  external rotation    Knee flexion 5 5  Knee extension 5 5  Ankle dorsiflexion 4+ 4  Ankle plantarflexion    Ankle inversion    Ankle eversion    (Blank rows = not tested)  TRANSFERS: Sit to stand: Modified independence  Assistive device utilized: None     Stand to sit: Modified independence  Assistive device utilized: None      GAIT: Findings: Gait Characteristics: step through pattern, decreased arm swing- Left, decreased step length- Right, and decreased step length- Left, Distance walked: 50 ft, Assistive device utilized:None, and Level of assistance: Modified independence  FUNCTIONAL TESTS:  5 times sit to stand: 10.81 sec Timed up and go (TUG): 10.5 10 meter walk test: 9.44 sec (3.48 ft/sec) Mini-BESTest: 21/28 (<22/28 indicates increased fall risk 3 M walk back:  5.94 sec TUG cognitive:  11.88 sec                                                                                                                                  GOALS:  Goals reviewed with patient? Yes  SHORT TERM GOALS:= LTGs   LONG TERM GOALS: Target date: 01/17/2024  Pt will be independent with HEP for improved strength, general flexibility, balance, gait per PD Foundation guidelines. Baseline:  Goal status: IN PROGRESS  2.  Pt will improve MiniBESTest score to at least 23/28 to decrease fall risk. Baseline: 21/28 Goal status: IN PROGRESS  3.  Pt will verbalize understanding of local Parkinson's disease community resources, including continued community fitness, to maximize gains made in PT.  Baseline:  Goal status: IN PROGRESS  4.  Pt will improve 5x sit<>stand to less than or equal to 9 sec to demonstrate improved functional strength and transfer efficiency.  Baseline:  10.81 sec  Goal status:  IN PROGRESS    ASSESSMENT:  CLINICAL IMPRESSION: Pt presents today with continue c/o neck pain; it is intermittent with review of stretching and attempts at gentle postural stretches.  Also, he  reports intermittent L neck tightness with some of the standing exercises.  Worked on SLS and step strategy work, based on pt's MiniBESTest deficits from eval.  At attempts for step strategy, pt reports lightheadedness, so PT check BP.  BP measures elevated, but do lessen with some rest at end of session.  Educated pt and wife and they are aware to follow up with MD or ED as needed based on symptoms of elevated BP.    OBJECTIVE IMPAIRMENTS: Abnormal gait, decreased balance, decreased mobility, decreased strength, and postural dysfunction.   ACTIVITY LIMITATIONS: locomotion level  PARTICIPATION LIMITATIONS: community activity and fitness  PERSONAL FACTORS: 3+ comorbidities: see above are also affecting patient's functional outcome.   REHAB POTENTIAL: Good  CLINICAL DECISION MAKING: Stable/uncomplicated  EVALUATION COMPLEXITY: Low  PLAN:  PT FREQUENCY: 2x/week  PT DURATION: 4 weeks plus eval  PLANNED INTERVENTIONS: 97750- Physical Performance Testing, 97110-Therapeutic exercises, 97530- Therapeutic activity, [id number]- Neuromuscular re-education, 97535- Self Care, [id number]- Gait training, Patient/Family education, and Balance training  PLAN FOR NEXT SESSION: Ask about BP measures; check LTGs and discuss POC.  Work on balance reactions forward/back/side (formally add pictures to HEP, SLS, compliant surface; use of Nustep (pt has one at home)-discuss interval training with this; general stretching, strengthening and discuss plans for trainer (likely at [facility])   STARLET GREIG ORN., PT 01/08/2024, 2:03 PM  [facility] at [facility] [address], [address] [place], [place] [address] Phone # [phone] Fax # [phone]

## 2024-01-08 NOTE — ED Triage Notes (Signed)
 Reports elevated BP while at PT this morning. Denies symptoms. States he has chronic back and neck pain.

## 2024-01-08 NOTE — ED Notes (Signed)
 Reviewed discharge instructions and follow-up care with pt. Pt verbalized understanding and had no further questions. Pt exited ED without complications.

## 2024-01-09 ENCOUNTER — Encounter: Payer: Self-pay | Admitting: Neurology

## 2024-01-09 ENCOUNTER — Other Ambulatory Visit: Payer: Self-pay | Admitting: Neurology

## 2024-01-09 DIAGNOSIS — Z23 Encounter for immunization: Secondary | ICD-10-CM | POA: Diagnosis not present

## 2024-01-09 MED ORDER — CARBIDOPA-LEVODOPA 25-100 MG PO TABS: 1.0000 | ORAL_TABLET | Freq: Four times a day (QID) | ORAL | 1 refills | Status: DC

## 2024-01-09 MED ORDER — PRAVASTATIN SODIUM 20 MG PO TABS: 20.0000 mg | ORAL_TABLET | Freq: Every day | ORAL | 3 refills | Status: AC

## 2024-01-13 ENCOUNTER — Encounter: Payer: Self-pay | Admitting: Cardiology

## 2024-01-15 ENCOUNTER — Ambulatory Visit: Admitting: Physical Therapy

## 2024-01-16 DIAGNOSIS — H61813 Exostosis of external canal, bilateral: Secondary | ICD-10-CM | POA: Diagnosis not present

## 2024-01-16 DIAGNOSIS — R42 Dizziness and giddiness: Secondary | ICD-10-CM | POA: Diagnosis not present

## 2024-01-16 DIAGNOSIS — H6121 Impacted cerumen, right ear: Secondary | ICD-10-CM | POA: Diagnosis not present

## 2024-01-17 ENCOUNTER — Ambulatory Visit: Attending: Physician Assistant | Admitting: Physician Assistant

## 2024-01-17 ENCOUNTER — Encounter: Payer: Self-pay | Admitting: Physician Assistant

## 2024-01-17 ENCOUNTER — Ambulatory Visit

## 2024-01-17 VITALS — BP 134/88 | HR 59 | Ht 72.0 in | Wt 291.0 lb

## 2024-01-17 DIAGNOSIS — R42 Dizziness and giddiness: Secondary | ICD-10-CM | POA: Insufficient documentation

## 2024-01-17 DIAGNOSIS — R0602 Shortness of breath: Secondary | ICD-10-CM | POA: Diagnosis not present

## 2024-01-17 DIAGNOSIS — R0989 Other specified symptoms and signs involving the circulatory and respiratory systems: Secondary | ICD-10-CM | POA: Diagnosis not present

## 2024-01-17 DIAGNOSIS — I7121 Aneurysm of the ascending aorta, without rupture: Secondary | ICD-10-CM | POA: Diagnosis not present

## 2024-01-17 DIAGNOSIS — E782 Mixed hyperlipidemia: Secondary | ICD-10-CM | POA: Diagnosis not present

## 2024-01-17 DIAGNOSIS — I2510 Atherosclerotic heart disease of native coronary artery without angina pectoris: Secondary | ICD-10-CM | POA: Diagnosis not present

## 2024-01-17 NOTE — Patient Instructions (Addendum)
 Medication Instructions:  Your physician recommends that you continue on your current medications as directed. Please refer to the Current Medication list given to you today.  *If you need a refill on your cardiac medications before your next appointment, please call your pharmacy*   Testing/Procedures: Your physician has requested that you have a renal artery duplex. During this test, an ultrasound is used to evaluate blood flow to the kidneys. Allow one hour for this exam. Do not eat after midnight the day before and avoid carbonated beverages. Take your medications as you usually do.    Your physician has requested that you have an echocardiogram. Echocardiography is a painless test that uses sound waves to create images of your heart. It provides your doctor with information about the size and shape of your heart and how well your heart's chambers and valves are working. This procedure takes approximately one hour. There are no restrictions for this procedure. Please do NOT wear cologne, perfume, aftershave, or lotions (deodorant is allowed). Please arrive 15 minutes prior to your appointment time.  Please note: We ask at that you not bring children with you during ultrasound (echo/ vascular) testing. Due to room size and safety concerns, children are not allowed in the ultrasound rooms during exams. Our front office staff cannot provide observation of children in our lobby area while testing is being conducted. An adult accompanying a patient to their appointment will only be allowed in the ultrasound room at the discretion of the ultrasound technician under special circumstances. We apologize for any inconvenience.   Your physician has requested that you have a carotid duplex. This test is an ultrasound of the carotid arteries in your neck. It looks at blood flow through these arteries that supply the brain with blood. Allow one hour for this exam. There are no restrictions or special  instructions.   Follow-Up:  Follow up will be determined after we get test results

## 2024-01-17 NOTE — Progress Notes (Unsigned)
 Cardiology Office Note   Date:  01/17/2024  ID:  Todd Chan, DOB April 25, 1945, MRN [id number] PCP: Cindy Clotilda HERO, DO  [facility] Providers Cardiologist:  Dub Huntsman, DO { Click to update primary MD,subspecialty MD or APP then REFRESH:1}    History of Present Illness Todd Chan is a 78 y.o. male with past medical history of CAD, hypertension, hyperlipidemia and TIA.  He had a abnormal stress test in January 2019 was a reversible defect in the inferior wall, this led to subsequent cardiac catheterization on 04/17/2017 which showed 15% ostial to proximal LAD lesion, 10% proximal LAD lesion, EF 55 to 60%.  Overall, he only has 10 to 15% luminal irregularities in the proximal LAD.  Medical therapy was recommended.  Last echocardiogram obtained on 09/11/2022 showed EF 60 to 65%, no regional wall motion abnormality, normal RV, mild MR, mildly dilated ascending aorta measuring 40 mm.  Recently, he presented to the emergency room on 01/08/2024 for elevated blood pressure while at physical therapy.  He described chest tightness, nausea and lightheadedness for several days.  Blood pressure was 168/92.  Basic metabolic panel showed stable renal function, borderline low sodium 134.  D-dimer 0.48.  Hemoglobin 13.5, white blood cell count 6.9.  Serial troponin negative.  Chest x-ray showed hypoinflation of the lung with stable bibasilar segment segmental atelectasis or scarring.  CT of the head showed no acute abnormality.  CTA of the chest abdomen pelvis showed dilated ascending aorta measuring at 4.1 x 4.0 cm, enlargement of the ascending aorta thoracic aorta up to 3.3 x 3.2 cm, no evidence of aortic dissection, mild thoracic and abdominal aortic atherosclerosis, hepatic steatosis and cholelithiasis.  Patient presents today for follow-up.  His blood pressure has settle down.  He did receive flu and COVID vaccination immediately prior to the ED arrival which may have contributed to the blood  pressure spikes.  Home blood pressure diary shows his blood pressure has been in the 130-170s range.  I recommend a renal artery duplex to make sure he does not have severe renal artery stenosis.  Recent CT image showed dense calcification at the junction between abdominal aorta and renal artery.  He has sporadic episode of shortness of breath, I am not confident this is cardiac in nature.  I will order echocardiogram.  He has been having chronic dizzy spell, he says occasionally his left face is numb and has tingling sensation.  Previous MRI  of brain in 2023 for the same issue did not show any significant spinal cord compression.  Last carotid Doppler in 2021 showed less than 50% bilateral ICA stenosis, I will repeat a carotid Doppler.  If the above ultrasounds normal, he can follow-up with Dr. Huntsman in 6 months, earlier if they come back abnormal.  Of note, we did obtain orthostatic vital signs in the office today.  See readings below.  Lying 114/70 heart rate 60 Sitting 126/60 heart rate 62 Standing 0-minute 98/66 heart rate 65 Standing 3-minute 122/64 heart rate 63   ROS: ***  Studies Reviewed      *** Risk Assessment/Calculations {Does this patient have ATRIAL FIBRILLATION?:[phone]}         Physical Exam VS:  BP 134/88   Pulse (!) 59   Ht 6' (1.829 m)   Wt 291 lb (132 kg)   SpO2 96%   BMI 39.47 kg/m        Wt Readings from Last 3 Encounters:  01/17/24 291 lb (132 kg)  01/02/24  296 lb 9.6 oz (134.5 kg)  08/14/23 300 lb (136.1 kg)    GEN: Well nourished, well developed in no acute distress NECK: No JVD; No carotid bruits CARDIAC: ***RRR, no murmurs, rubs, gallops RESPIRATORY:  Clear to auscultation without rales, wheezing or rhonchi  ABDOMEN: Soft, non-tender, non-distended EXTREMITIES:  No edema; No deformity   ASSESSMENT AND PLAN ***    {Are you ordering a CV Procedure (e.g. stress test, cath, DCCV, TEE, etc)?   Press F2        :[id number]}  Dispo:  ***  Signed, Scot Ford, PA

## 2024-01-21 DIAGNOSIS — R3129 Other microscopic hematuria: Secondary | ICD-10-CM | POA: Diagnosis not present

## 2024-01-21 DIAGNOSIS — R300 Dysuria: Secondary | ICD-10-CM | POA: Diagnosis not present

## 2024-01-21 DIAGNOSIS — N390 Urinary tract infection, site not specified: Secondary | ICD-10-CM | POA: Diagnosis not present

## 2024-01-21 DIAGNOSIS — Z6841 Body Mass Index (BMI) 40.0 and over, adult: Secondary | ICD-10-CM | POA: Diagnosis not present

## 2024-01-23 ENCOUNTER — Encounter: Payer: Self-pay | Admitting: Pulmonary Disease

## 2024-01-23 NOTE — Telephone Encounter (Signed)
 Any PCP recommendations in [facility]?

## 2024-01-24 NOTE — Telephone Encounter (Signed)
 It should have no impact on her renal artery US  Todd Chan

## 2024-01-28 ENCOUNTER — Ambulatory Visit (HOSPITAL_COMMUNITY)
Admission: RE | Admit: 2024-01-28 | Discharge: 2024-01-28 | Disposition: A | Discharge status: Home / Self Care | Source: Ambulatory Visit | Attending: Physician Assistant | Admitting: Physician Assistant

## 2024-01-28 DIAGNOSIS — I7121 Aneurysm of the ascending aorta, without rupture: Secondary | ICD-10-CM | POA: Insufficient documentation

## 2024-01-28 DIAGNOSIS — R0989 Other specified symptoms and signs involving the circulatory and respiratory systems: Secondary | ICD-10-CM | POA: Diagnosis not present

## 2024-01-30 ENCOUNTER — Ambulatory Visit: Payer: Self-pay | Admitting: Physician Assistant

## 2024-01-31 ENCOUNTER — Ambulatory Visit (HOSPITAL_BASED_OUTPATIENT_CLINIC_OR_DEPARTMENT_OTHER)
Admission: RE | Admit: 2024-01-31 | Discharge: 2024-01-31 | Disposition: A | Discharge status: Home / Self Care | Source: Ambulatory Visit | Attending: Physician Assistant | Admitting: Physician Assistant

## 2024-01-31 ENCOUNTER — Ambulatory Visit (HOSPITAL_COMMUNITY)
Admission: RE | Admit: 2024-01-31 | Discharge: 2024-01-31 | Disposition: A | Discharge status: Home / Self Care | Source: Ambulatory Visit | Attending: Physician Assistant | Admitting: Physician Assistant

## 2024-01-31 DIAGNOSIS — R0602 Shortness of breath: Secondary | ICD-10-CM | POA: Insufficient documentation

## 2024-01-31 DIAGNOSIS — R42 Dizziness and giddiness: Secondary | ICD-10-CM | POA: Diagnosis not present

## 2024-01-31 LAB — ECHOCARDIOGRAM COMPLETE
AR max vel: 2.65 cm2
AV Area VTI: 2.83 cm2
AV Area mean vel: 2.63 cm2
AV Mean grad: 8 mmHg
AV Peak grad: 15.8 mmHg
Ao pk vel: 1.99 m/s
Area-P 1/2: 3.61 cm2
S' Lateral: 3.8 cm

## 2024-02-04 ENCOUNTER — Other Ambulatory Visit: Payer: Self-pay | Admitting: Neurology

## 2024-02-04 ENCOUNTER — Ambulatory Visit: Attending: Physician Assistant

## 2024-02-04 ENCOUNTER — Other Ambulatory Visit: Payer: Self-pay | Admitting: Physician Assistant

## 2024-02-04 ENCOUNTER — Encounter: Payer: Self-pay | Admitting: Neurology

## 2024-02-04 DIAGNOSIS — R002 Palpitations: Secondary | ICD-10-CM

## 2024-02-04 MED ORDER — SERTRALINE HCL 50 MG PO TABS: 50.0000 mg | ORAL_TABLET | Freq: Every day | ORAL | 3 refills | Status: AC

## 2024-02-04 NOTE — Progress Notes (Unsigned)
 Enrolled for [employer] to mail a ZIO XT long term holter monitor to the patients address on file.   Dr. Harriet Masson to read.

## 2024-02-04 NOTE — Telephone Encounter (Signed)
 I have reviewed the patient's Kardia mobile strips, it appears patient has possible atrial flutter, however unclear to me if they are true atrial flutter or it is motion artifact generated by the patient's Parkinson's movement.  I recommended a heart monitor (2 week ZIO XT) to further assess.  Please arrange.  Patient also complains of occasional left-sided back pain.  He initially said the back pain is worse with activity but also mentions this has been going on for years.  Previous cardiac catheterization in 2019 showed minimal plaque.  After discussing various options including monitoring versus coronary CTA, patient wished to continue to monitor the symptom for the time being and contact us  to get the coronary CTA if symptom worsens.    Please arrange 2-week ZIO XT heart monitor for now.  Patient is aware the heart monitor will be mailed to him.  Todd Chan

## 2024-02-06 DIAGNOSIS — M549 Dorsalgia, unspecified: Secondary | ICD-10-CM

## 2024-02-06 DIAGNOSIS — R0609 Other forms of dyspnea: Secondary | ICD-10-CM

## 2024-02-07 NOTE — Telephone Encounter (Signed)
 Please order coronary CTA for SOB and left sided back pain. Continue his home carvedilol  in the morning of coronary CT, his HR is good enough on the carvedilol , he does not need extra metoprolol  in the morning of the study. Last Cr good enough for coronary CT on 10/8. Thank you

## 2024-02-10 ENCOUNTER — Encounter (HOSPITAL_BASED_OUTPATIENT_CLINIC_OR_DEPARTMENT_OTHER)

## 2024-02-11 ENCOUNTER — Other Ambulatory Visit: Payer: Self-pay | Admitting: Physician Assistant

## 2024-02-11 DIAGNOSIS — R072 Precordial pain: Secondary | ICD-10-CM

## 2024-02-11 DIAGNOSIS — R079 Chest pain, unspecified: Secondary | ICD-10-CM

## 2024-02-11 NOTE — Telephone Encounter (Signed)
 I have called the patient, unfortunately his parkinson's disorder make the Kardia mobile tracing inaccurate, because the motion artifact make any sinus beat look more like atrial flutter. He is currently wearing a 2 week heart monitor, will follow up on the monitor result

## 2024-02-11 NOTE — Telephone Encounter (Signed)
 Can we use dyspnea on exertion as diagnosis?

## 2024-02-12 ENCOUNTER — Other Ambulatory Visit (HOSPITAL_BASED_OUTPATIENT_CLINIC_OR_DEPARTMENT_OTHER)

## 2024-02-12 ENCOUNTER — Encounter (HOSPITAL_BASED_OUTPATIENT_CLINIC_OR_DEPARTMENT_OTHER)

## 2024-02-12 ENCOUNTER — Other Ambulatory Visit: Payer: Self-pay | Admitting: Neurology

## 2024-02-12 NOTE — Telephone Encounter (Signed)
 Last seen on 06/18/23 Follow up scheduled on 06/23/24    Dispensed Days Supply Quantity Provider Pharmacy  [facility]  25-100 TAB 02/02/2024 30 120 each Vear Charlie LABOR, MD [facility] [phone] O...      Too soon to refill Rx just filled on 02/02/24 Rx denied

## 2024-02-13 ENCOUNTER — Encounter: Payer: Self-pay | Admitting: Neurology

## 2024-02-13 ENCOUNTER — Ambulatory Visit: Admitting: Podiatry

## 2024-02-13 ENCOUNTER — Other Ambulatory Visit: Payer: Self-pay | Admitting: Neurology

## 2024-02-13 DIAGNOSIS — R300 Dysuria: Secondary | ICD-10-CM | POA: Diagnosis not present

## 2024-02-13 DIAGNOSIS — F419 Anxiety disorder, unspecified: Secondary | ICD-10-CM | POA: Diagnosis not present

## 2024-02-13 DIAGNOSIS — R5383 Other fatigue: Secondary | ICD-10-CM | POA: Diagnosis not present

## 2024-02-13 DIAGNOSIS — Z6839 Body mass index (BMI) 39.0-39.9, adult: Secondary | ICD-10-CM | POA: Diagnosis not present

## 2024-02-13 DIAGNOSIS — Z91199 Patient's noncompliance with other medical treatment and regimen due to unspecified reason: Secondary | ICD-10-CM

## 2024-02-13 DIAGNOSIS — I10 Essential (primary) hypertension: Secondary | ICD-10-CM | POA: Diagnosis not present

## 2024-02-13 NOTE — Progress Notes (Signed)
 Cancel 24 hours- sick

## 2024-02-19 ENCOUNTER — Encounter (HOSPITAL_COMMUNITY): Payer: Self-pay | Admitting: Emergency Medicine

## 2024-02-19 ENCOUNTER — Encounter (HOSPITAL_COMMUNITY): Payer: Self-pay

## 2024-02-24 ENCOUNTER — Ambulatory Visit (HOSPITAL_BASED_OUTPATIENT_CLINIC_OR_DEPARTMENT_OTHER)
Admission: RE | Admit: 2024-02-24 | Discharge: 2024-02-24 | Disposition: A | Discharge status: Home / Self Care | Source: Ambulatory Visit | Attending: Cardiovascular Disease | Admitting: Cardiovascular Disease

## 2024-02-24 ENCOUNTER — Other Ambulatory Visit: Payer: Self-pay | Admitting: Cardiovascular Disease

## 2024-02-24 ENCOUNTER — Ambulatory Visit (HOSPITAL_COMMUNITY)
Admission: RE | Admit: 2024-02-24 | Discharge: 2024-02-24 | Disposition: A | Discharge status: Home / Self Care | Source: Ambulatory Visit | Attending: Physician Assistant | Admitting: Physician Assistant

## 2024-02-24 DIAGNOSIS — R931 Abnormal findings on diagnostic imaging of heart and coronary circulation: Secondary | ICD-10-CM | POA: Insufficient documentation

## 2024-02-24 DIAGNOSIS — R072 Precordial pain: Secondary | ICD-10-CM | POA: Insufficient documentation

## 2024-02-24 DIAGNOSIS — I2510 Atherosclerotic heart disease of native coronary artery without angina pectoris: Secondary | ICD-10-CM | POA: Diagnosis not present

## 2024-02-24 DIAGNOSIS — R079 Chest pain, unspecified: Secondary | ICD-10-CM | POA: Insufficient documentation

## 2024-02-24 MED ORDER — NITROGLYCERIN 0.4 MG SL SUBL
0.8000 mg | SUBLINGUAL_TABLET | Freq: Once | SUBLINGUAL | Status: AC
  Administered 2024-02-24: 0.8 mg via SUBLINGUAL

## 2024-02-24 MED ORDER — IOHEXOL 350 MG/ML SOLN
100.0000 mL | Freq: Once | INTRAVENOUS | Status: AC | PRN
  Administered 2024-02-24: 100 mL via INTRAVENOUS

## 2024-02-25 ENCOUNTER — Ambulatory Visit: Payer: Self-pay | Admitting: Physician Assistant

## 2024-02-28 ENCOUNTER — Other Ambulatory Visit: Payer: Self-pay | Admitting: Cardiology

## 2024-02-28 DIAGNOSIS — R002 Palpitations: Secondary | ICD-10-CM | POA: Diagnosis not present

## 2024-03-02 DIAGNOSIS — R002 Palpitations: Secondary | ICD-10-CM

## 2024-03-06 ENCOUNTER — Ambulatory Visit: Payer: Self-pay | Admitting: Physician Assistant

## 2024-03-06 NOTE — Progress Notes (Signed)
 Will review further on follow up, 3.1% PAC burden. No significant irregular rhythm.  Heart rate diagram showed heart rate has been within normal range.  Rare fast bursts, although which are quite transient and considered benign.  15 patient triggered events, 15 diary events. All patient triggered events and the diary events has been reviewed personally.  Underlying strep shows occasional PACs but no significant irregular rhythm.

## 2024-03-08 DIAGNOSIS — B349 Viral infection, unspecified: Secondary | ICD-10-CM | POA: Diagnosis not present

## 2024-03-08 DIAGNOSIS — R300 Dysuria: Secondary | ICD-10-CM | POA: Diagnosis not present

## 2024-03-12 DIAGNOSIS — N401 Enlarged prostate with lower urinary tract symptoms: Secondary | ICD-10-CM | POA: Diagnosis not present

## 2024-03-12 DIAGNOSIS — N471 Phimosis: Secondary | ICD-10-CM | POA: Diagnosis not present

## 2024-03-17 ENCOUNTER — Encounter: Payer: Self-pay | Admitting: Physician Assistant

## 2024-03-17 ENCOUNTER — Ambulatory Visit: Attending: Physician Assistant | Admitting: Physician Assistant

## 2024-03-17 VITALS — BP 146/84 | HR 79 | Ht 72.0 in | Wt 285.2 lb

## 2024-03-17 DIAGNOSIS — I7121 Aneurysm of the ascending aorta, without rupture: Secondary | ICD-10-CM | POA: Diagnosis present

## 2024-03-17 DIAGNOSIS — E785 Hyperlipidemia, unspecified: Secondary | ICD-10-CM | POA: Diagnosis present

## 2024-03-17 DIAGNOSIS — I10 Essential (primary) hypertension: Secondary | ICD-10-CM | POA: Insufficient documentation

## 2024-03-17 DIAGNOSIS — I2510 Atherosclerotic heart disease of native coronary artery without angina pectoris: Secondary | ICD-10-CM | POA: Diagnosis not present

## 2024-03-17 MED ORDER — CARVEDILOL 12.5 MG PO TABS: 18.7500 mg | ORAL_TABLET | Freq: Two times a day (BID) | ORAL | 3 refills | Status: DC

## 2024-03-17 NOTE — Progress Notes (Unsigned)
 Cardiology Office Note   Date:  03/18/2024  ID:  Ori, Trejos [birth year], MRN [id number] PCP: Cindy Clotilda CHRISTELLA, DO  [facility] Providers Cardiologist:  Dub Huntsman, DO     History of Present Illness Todd Chan is a 78 y.o. male with past medical history of CAD, hypertension, hyperlipidemia, TAA and TIA.  He had a abnormal stress test in January 2019 with a reversible defect in the inferior wall, this led to subsequent cardiac catheterization on 04/17/2017 which showed 15% ostial to proximal LAD lesion, 10% proximal LAD lesion, EF 55 to 60%.  Overall, he only has 10 to 15% luminal irregularities in the proximal LAD.  Medical therapy was recommended.  Last echocardiogram obtained on 09/11/2022 showed EF 60 to 65%, no regional wall motion abnormality, normal RV, mild MR, mildly dilated ascending aorta measuring 40 mm.     Recently, he presented to the emergency room on 01/08/2024 for elevated blood pressure while at physical therapy.  He described chest tightness, nausea and lightheadedness for several days.  Blood pressure was 168/92.  Basic metabolic panel showed stable renal function, borderline low sodium 134.  D-dimer 0.48.  Hemoglobin 13.5, white blood cell count 6.9.  Serial troponin negative.  Chest x-ray showed hypoinflation of the lung with stable bibasilar segment segmental atelectasis or scarring.  CT of the head showed no acute abnormality.  CTA of the chest abdomen pelvis showed dilated ascending aorta measuring at 4.1 x 4.0 cm, enlargement of the ascending aorta thoracic aorta up to 3.3 x 3.2 cm, no evidence of aortic dissection, mild thoracic and abdominal aortic atherosclerosis, hepatic steatosis and cholelithiasis.   I last saw the patient on 01/17/2024 at which time his blood pressure ranges between 130-170s.  He also describes sporadic episode of shortness of breath and dizziness.  I recommended echocardiogram, carotid Doppler and renal Doppler.  Some of his  dizziness may be more related to orthostatic changes. Carotid artery showed minimal plaque.  Renal artery Doppler showed 1 to 59% stenosis in the left renal artery, no evidence of right renal artery stenosis, normal celiac artery and SMA.  Echocardiogram obtained on 01/31/2024 showed EF 60 to 65%, no regional wall motion abnormality, mild LAE, dilated ascending aorta measuring 43 mm.  Patient was having some abnormal Kardia mobile tracings.  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  Heart monitor showed 3.1% PAC burden, no significant irregular rhythm.  He also complained of left-sided back pain and shortness of breath.  Subsequent coronary CTA showed mild to moderate severity lingular, right middle lobe and bilateral lower lobe linear scarring, coronary calcium score of 826 which placed the patient at 73rd percentile for age and sex matched control, dilated ascending aorta, 25 to 49% disease in proximal and mid LAD followed by 70% mid to distal LAD lesion.  Subsequent FFR study shows proximal FFR 0.99, mid LAD FFR 0.96, distal LAD FFR 0.86 and apical LAD FFR 0.69.  RCA could not be analyzed due to motion artifact, however there was no concern for obstructive disease in the RCA or left circumflex artery.  Patient presents today for follow-up.  He continued to have back pain and labile blood pressure.  I will increase carvedilol  to 18.75 mg twice a day.  I reviewed his recent coronary CTA and FFR was patient and Dr. Mona DoD, since the FFR only become abnormal in the distal LAD, this could very  well be tapering effect.  At this time, we recommend medication therapy.  The increased carvedilol  will help.  He has no lower extremity edema, orthopnea or PND.  Although there is some possible flutter waves on the heart monitor strips, however upon review with Dr. Mona, he felt that more likely to be artifact and not underlying rhythm as  sinus rhythm.  Overall, patient has been doing well.  He has occasional chest discomfort could be related to acid reflux as they typically occur at rest and does not occur with exertion.  He can follow-up with Dr. Sheena in 6 months unless symptom changes.   ROS:   Patient continued to have back pain.  He denies significant lower extremity edema, orthopnea or PND.  Studies Reviewed      Cardiac Studies & Procedures   ______________________________________________________________________________________________ CARDIAC CATHETERIZATION  CARDIAC CATHETERIZATION 04/17/2017  Conclusion  Ost LAD to Prox LAD lesion is 15% stenosed.  Prox LAD lesion is 10% stenosed.  The left ventricular systolic function is normal.  LV end diastolic pressure is normal.  The left ventricular ejection fraction is 55-65% by visual estimate.  Normal LV function without focal wall motion abnormalities.  EF 55-60%.  No significant coronary obstructive disease, although there is a small area of calcification superior to the ostium of the a short left main, 10-15% luminal irregularity of the proximal LAD and a normal ramus intermediate, left circumflex, and dominant RCA.  RECOMMENDATION: Medical therapy.  Suspect attenuation artifact contributing to the patient's abnormal nuclear stress test.  The patient will return to Dr. Redell Leiter and will be given preoperative cardiac clearance.  Findings Coronary Findings Diagnostic  Dominance: Right  Left Anterior Descending Ost LAD to Prox LAD lesion is 15% stenosed. Prox LAD lesion is 10% stenosed.  Intervention  No interventions have been documented.   STRESS TESTS  MYOCARDIAL PERFUSION IMAGING 04/09/2017  Interpretation Summary  Nuclear stress EF: 62%.  There was no ST segment deviation noted during stress.  Defect 1: There is a medium defect of moderate severity present in the basal inferior, mid inferior and apical inferior location.  The left  ventricular ejection fraction is normal (55-65%).  Findings consistent with ischemia.  This is an intermediate risk study.  This is a poor quality study, there appears to be a reversible defect in the inferior wall (SDS=3), however artifact can't be excluded. Consider a coronary CTA for further evaluation.   ECHOCARDIOGRAM  ECHOCARDIOGRAM COMPLETE 01/31/2024  Narrative ECHOCARDIOGRAM REPORT    Patient Name:   LEMARCUS BAGGERLY [facility] Date of Exam: 01/31/2024 Medical Rec #:  [id number]        Height:       72.0 in Accession #:    [id number]       Weight:       291.0 lb Date of Birth:  1945/07/18        BSA:          2.499 m Patient Age:    78 years         BP:           134/88 mmHg Patient Gender: M                HR:           67 bpm. Exam Location:  [address]  Procedure: 2D Echo, Strain Analysis and 3D Echo (Both Spectral and Color Flow Doppler were utilized during procedure).  Indications:    R06.02 SOB  History:  Patient has prior history of Echocardiogram examinations, most recent 09/11/2022. Risk Factors:Hypertension and Dyslipidemia.  Sonographer:    Powell Saras RDCS Referring Phys: [phone] Kourtni Stineman  IMPRESSIONS   1. Left ventricular ejection fraction, by estimation, is 60 to 65%. Left ventricular ejection fraction by 3D volume is 64 %. The left ventricle has normal function. The left ventricle has no regional wall motion abnormalities. Left ventricular diastolic parameters were normal. The average left ventricular global longitudinal strain is -22.9 %. The global longitudinal strain is normal. 2. Right ventricular systolic function is normal. The right ventricular size is normal. 3. Left atrial size was mildly dilated. 4. The mitral valve is normal in structure. Trivial mitral valve regurgitation. No evidence of mitral stenosis. 5. The aortic valve is tricuspid. Aortic valve regurgitation is not visualized. Aortic valve sclerosis/calcification is present,  without any evidence of aortic stenosis. 6. Aortic dilatation noted. There is borderline dilatation of the aortic root, measuring 40 mm. There is mild dilatation of the ascending aorta, measuring 43 mm. 7. The inferior vena cava is normal in size with greater than 50% respiratory variability, suggesting right atrial pressure of 3 mmHg.  FINDINGS Left Ventricle: Left ventricular ejection fraction, by estimation, is 60 to 65%. Left ventricular ejection fraction by 3D volume is 64 %. The left ventricle has normal function. The left ventricle has no regional wall motion abnormalities. The average left ventricular global longitudinal strain is -22.9 %. Strain was performed and the global longitudinal strain is normal. The left ventricular internal cavity size was normal in size. There is no left ventricular hypertrophy. Left ventricular diastolic parameters were normal.  Right Ventricle: The right ventricular size is normal. No increase in right ventricular wall thickness. Right ventricular systolic function is normal.  Left Atrium: Left atrial size was mildly dilated.  Right Atrium: Right atrial size was normal in size.  Pericardium: There is no evidence of pericardial effusion.  Mitral Valve: The mitral valve is normal in structure. Trivial mitral valve regurgitation. No evidence of mitral valve stenosis.  Tricuspid Valve: The tricuspid valve is normal in structure. Tricuspid valve regurgitation is mild . No evidence of tricuspid stenosis.  Aortic Valve: The aortic valve is tricuspid. Aortic valve regurgitation is not visualized. Aortic valve sclerosis/calcification is present, without any evidence of aortic stenosis. Aortic valve mean gradient measures 8.0 mmHg. Aortic valve peak gradient measures 15.8 mmHg. Aortic valve area, by VTI measures 2.83 cm.  Pulmonic Valve: The pulmonic valve was normal in structure. Pulmonic valve regurgitation is not visualized. No evidence of pulmonic  stenosis.  Aorta: Aortic dilatation noted. There is borderline dilatation of the aortic root, measuring 40 mm. There is mild dilatation of the ascending aorta, measuring 43 mm.  Venous: The inferior vena cava is normal in size with greater than 50% respiratory variability, suggesting right atrial pressure of 3 mmHg.  IAS/Shunts: No atrial level shunt detected by color flow Doppler.  Additional Comments: 3D was performed not requiring image post processing on an independent workstation and was normal.   LEFT VENTRICLE PLAX 2D LVIDd:         5.40 cm         Diastology LVIDs:         3.80 cm         LV e' medial:    8.59 cm/s LV PW:         0.90 cm         LV E/e' medial:  9.3 LV IVS:  0.90 cm         LV e' lateral:   10.30 cm/s LVOT diam:     2.30 cm         LV E/e' lateral: 7.8 LV SV:         113 LV SV Index:   45              2D Longitudinal LVOT Area:     4.15 cm        Strain 2D Strain GLS   -22.9 % Avg:  3D Volume EF LV 3D EF:    Left ventricul ar ejection fraction by 3D volume is 64 %.  3D Volume EF: 3D EF:        64 % LV EDV:       204 ml LV ESV:       74 ml LV SV:        130 ml  RIGHT VENTRICLE             IVC RV Basal diam:  4.50 cm     IVC diam: 2.10 cm RV Mid diam:    3.70 cm RV S prime:     13.70 cm/s TAPSE (M-mode): 2.7 cm  LEFT ATRIUM             Index        RIGHT ATRIUM           Index LA Vol (A2C):   98.2 ml 39.30 ml/m  RA Area:     18.90 cm LA Vol (A4C):   95.6 ml 38.26 ml/m  RA Volume:   48.60 ml  19.45 ml/m LA Biplane Vol: 98.3 ml 39.34 ml/m AORTIC VALVE AV Area (Vmax):    2.65 cm AV Area (Vmean):   2.63 cm AV Area (VTI):     2.83 cm AV Vmax:           199.00 cm/s AV Vmean:          130.000 cm/s AV VTI:            0.399 m AV Peak Grad:      15.8 mmHg AV Mean Grad:      8.0 mmHg LVOT Vmax:         127.00 cm/s LVOT Vmean:        82.300 cm/s LVOT VTI:          0.272 m LVOT/AV VTI ratio: 0.68  AORTA Ao Root diam: 4.00  cm Ao Asc diam:  4.30 cm  MITRAL VALVE MV Area (PHT): 3.61 cm    SHUNTS MV Decel Time: 210 msec    Systemic VTI:  0.27 m MV E velocity: 80.10 cm/s  Systemic Diam: 2.30 cm MV A velocity: 77.20 cm/s MV E/A ratio:  1.04  Morene Brownie Electronically signed by Morene Brownie Signature Date/Time: 01/31/2024/4:26:06 PM    Final    MONITORS  LONG TERM MONITOR (3-14 DAYS) 03/02/2024  Narrative Patch Wear Time:  13 days and 15 hours (2025-11-06T16:11:31-0500 to 2025-11-20T08:03:19-0500)  Patient had a min HR of 51 bpm, max HR of 171 bpm, and avg HR of 70 bpm. Predominant underlying rhythm was Sinus Rhythm. Bundle Branch Block/IVCD was present. 6 Supraventricular Tachycardia runs occurred, the run with the fastest interval lasting 5 beats with a max rate of 171 bpm, the longest lasting 17 beats with an avg rate of 128 bpm. Isolated SVEs were occasional (3.1%, 38700), SVE Couplets were rare (<1.0%, 164), and SVE Triplets were rare (<  1.0%, 36). Isolated VEs were rare (<1.0%), and no VE Couplets or VE Triplets were present.  Symptoms associated with sinus rhythm and occasional premature atrial complexes.  Conclusion: This study shows evidence of the following: 1. Symptomatic premature atrial complexes 2. Rare paroxysmal supraventricular tachycardia which is likely atrial tachycardia with variable block.   CT SCANS  CT CORONARY MORPH W/CTA COR W/SCORE 02/24/2024  Addendum 02/29/2024  9:38 PM ADDENDUM REPORT: 02/29/2024 21:36  EXAM: OVER-READ INTERPRETATION  CT CHEST  The following report is an over-read performed by radiologist Dr. Suzen Dials of [facility] on 02/29/2024. This over-read does not include interpretation of cardiac or coronary anatomy or pathology. The coronary calcium score/coronary CTA interpretation by the cardiologist is attached.  COMPARISON:  January 08, 2024  FINDINGS: Cardiovascular: There are no significant extracardiac  vascular findings.  Mediastinum/Nodes: There are no enlarged lymph nodes within the visualized mediastinum.  Lungs/Pleura: There is no pleural effusion. There is mild to moderate severity lingular, right middle lobe and bilateral lower lobe linear scarring and/or atelectasis. Mild to moderate severity elevation of the right hemidiaphragm is seen.  Upper abdomen: A layer of tiny gallstones is seen within the dependent portion of the gallbladder lumen.  Musculoskeletal/Chest wall: No chest wall mass or suspicious osseous findings within the visualized chest.  IMPRESSION: 1. Mild to moderate severity lingular, right middle lobe and bilateral lower lobe linear scarring and/or atelectasis. 2. Cholelithiasis.   Electronically Signed By: Suzen Dials M.D. On: 02/29/2024 21:36  Narrative CLINICAL DATA:  Chest pain  EXAM: Cardiac CTA  MEDICATIONS: Sub lingual nitro. 4mg   TECHNIQUE: A non-contrast, gated CT scan was obtained with axial slices of 2.5 mm through the heart for calcium scoring. Calcium scoring was performed using the Agatston method. A 120 kV prospective, gated, contrast cardiac CT scan was obtained. Gantry rotation speed was 230 msec and collimation was 0.63 mm. Two sublingual nitroglycerin  tablets (0.8 mg) were given. The 3D data set was reconstructed with motion correction for the best systolic or diastolic phase. Images were analyzed on a dedicated workstation using MPR, MIP, and VRT modes. The patient received 95 cc of contrast.  FINDINGS: Non-cardiac: See separate report from [facility]. No significant findings on limited lung and soft tissue windows.  Calcium Score: LM and 3 vessel calcium  LM 72.6  LAD 388  LCX 364  RCA 1.37  Total 826  Coronary Arteries: Right dominant with no anomalies  LM: 1-24% calcified plaque involving left sinus and LM  LAD: 25-49% mixed plaque in proximal and mid vessel 70% mid/distal mixed plaque  at take off of D2  IM: Small normal  D1: Normal  D2: Normal  Circumflex: 1-24% calcified plaque proximally  OM1: Normal  OM2: Normal  RCA: 1-24% calcified plaque proximally  PDA: Normal  PLA: Normal  IMPRESSION: 1. LM 3 vessel calcium score 826 which is 73 rd percentile for age/sex  2.  Dilated ascending thoracic aorta 4.0 cm  3. CAD RADS 3 possibly obstructive dx in mid LAD study sent for FFR CT  Maude Emmer  Electronically Signed: By: Maude Emmer M.D. On: 02/24/2024 16:05     ______________________________________________________________________________________________      Risk Assessment/Calculations         Physical Exam VS:  BP (!) 146/84 (BP Location: Right Arm, Patient Position: Sitting, Cuff Size: Large)   Pulse 79   Ht 6' (1.829 m)   Wt 285 lb 3.2 oz (129.4 kg)   SpO2 97%  BMI 38.68 kg/m        Wt Readings from Last 3 Encounters:  03/17/24 285 lb 3.2 oz (129.4 kg)  01/17/24 291 lb (132 kg)  01/02/24 296 lb 9.6 oz (134.5 kg)    GEN: Well nourished, well developed in no acute distress NECK: No JVD; No carotid bruits CARDIAC: RRR, no murmurs, rubs, gallops RESPIRATORY:  Clear to auscultation without rales, wheezing or rhonchi  ABDOMEN: Soft, non-tender, non-distended EXTREMITIES:  No edema; No deformity   ASSESSMENT AND PLAN  CAD: Continue aspirin .  Recent coronary CT showed 70% mid LAD lesion however FFR did not significantly drop until near the apical LAD territory.  Suspect the drop in FFR is due to tapering effect.  We recommended medical therapy.  Will increase carvedilol  to 18.75 mg twice a day  Hypertension: Blood pressure elevated today, increase carvedilol  to 18.75 mg twice a day  Hyperlipidemia: On pravastatin   Thoracic aortic aneurysm: Recent coronary CT showed dilated ascending aorta measuring 4.3 cm.         Dispo: Follow-up with Dr. Sheena in 6 months.  Signed, Scot Ford, PA

## 2024-03-17 NOTE — Patient Instructions (Signed)
 Medication Instructions:  INCREASE CARVEDILOL  TO 18.75 MG TWICE A DAY *If you need a refill on your cardiac medications before your next appointment, please call your pharmacy*  Lab Work: NO LABS If you have labs (blood work) drawn today and your tests are completely normal, you will receive your results only by: MyChart Message (if you have MyChart) OR A paper copy in the mail If you have any lab test that is abnormal or we need to change your treatment, we will call you to review the results.  Testing/Procedures: NO TESTING  Follow-Up: At [facility], you and your health needs are our priority.  As part of our continuing mission to provide you with exceptional heart care, our providers are all part of one team.  This team includes your primary Cardiologist (physician) and Advanced Practice Providers or APPs (Physician Assistants and Nurse Practitioners) who all work together to provide you with the care you need, when you need it.  Your next appointment:   6 month(s)  Provider:   Kardie Tobb, DO

## 2024-03-19 MED ORDER — CARVEDILOL 12.5 MG PO TABS: 18.7500 mg | ORAL_TABLET | Freq: Two times a day (BID) | ORAL | 3 refills | Status: DC

## 2024-03-23 MED ORDER — CARVEDILOL 12.5 MG PO TABS: 18.7500 mg | ORAL_TABLET | Freq: Two times a day (BID) | ORAL | 3 refills | Status: AC

## 2024-03-23 NOTE — Addendum Note (Signed)
 Addended by: MEMORY DELON POUR on: 03/23/2024 07:41 AM   Modules accepted: Orders

## 2024-04-06 MED ORDER — CARVEDILOL 12.5 MG PO TABS: 18.7500 mg | ORAL_TABLET | Freq: Two times a day (BID) | ORAL | 3 refills | Status: DC

## 2024-04-06 NOTE — Telephone Encounter (Signed)
 Spoke with [name] in regards to Carvedilol  prescription. Pt medication was increased from 12.5 mg to 18.75 mg. Prescription sent 03/23/2024 but keeps getting denied and Pharmacy states they cannot fill it. On phone with [name] for 40 minutes. Unable to get any assistance with getting this filled. Am going to send to pharmacy for their assistance. Pt needs this as soon as possible- per Hao Meng, PA.

## 2024-04-06 NOTE — Addendum Note (Signed)
 Addended by: BETHENA MOATS R on: 04/06/2024 10:01 AM   Modules accepted: Orders

## 2024-04-07 ENCOUNTER — Telehealth: Payer: Self-pay

## 2024-04-07 NOTE — Telephone Encounter (Signed)
 Duplicate order deleted per patient request.

## 2024-04-09 ENCOUNTER — Encounter: Payer: Self-pay | Admitting: Cardiology

## 2024-04-10 ENCOUNTER — Ambulatory Visit: Admitting: Podiatry

## 2024-04-10 ENCOUNTER — Encounter: Payer: Self-pay | Admitting: Podiatry

## 2024-04-10 DIAGNOSIS — M79675 Pain in left toe(s): Secondary | ICD-10-CM | POA: Diagnosis not present

## 2024-04-10 DIAGNOSIS — I739 Peripheral vascular disease, unspecified: Secondary | ICD-10-CM

## 2024-04-10 DIAGNOSIS — M79674 Pain in right toe(s): Secondary | ICD-10-CM | POA: Diagnosis not present

## 2024-04-10 DIAGNOSIS — B351 Tinea unguium: Secondary | ICD-10-CM

## 2024-04-10 DIAGNOSIS — G609 Hereditary and idiopathic neuropathy, unspecified: Secondary | ICD-10-CM | POA: Diagnosis not present

## 2024-04-10 NOTE — Progress Notes (Signed)
"  °  Subjective:  Patient ID: Todd Chan, male    DOB: 1946-03-09,   MRN: [id number]  Chief Complaint  Patient presents with   Nail Problem    Just toenails    79 y.o. male presents for concern of painful thickened and elongated toenails with a history of PVD and neuropathy. Denies diabetes. Hoping to have nails trimmed   . Denies any other pedal complaints. Denies n/v/f/c.   PCP: Clotilda Barefoot DO   Past Medical History:  Diagnosis Date   Arthritis    GERD (gastroesophageal reflux disease)    Glaucoma    Hypertension    Osteoarthritis of knee    Painful urination    Peripheral vascular disease    bilateral edema     Objective:  Physical Exam: Vascular: DP/PT pulses 1/4 bilateral. CFT <3 seconds. Normal hair growth on digits. No edema.  Skin. No lacerations or abrasions bilateral feet. Nails 1-5 are thickened discolored and elongated with subungual debris. Left second digit medial small blistered area that appears to be healing well underlying this.  Musculoskeletal: MMT 5/5 bilateral lower extremities in DF, PF, Inversion and Eversion. Deceased ROM in DF of ankle joint. No pain to palpation.  Neurological: Sensation diminished to light touch. Protective sensation present to all 10 locations with SWMF.   Assessment:   1. Pain due to onychomycosis of toenails of both feet   2. Idiopathic peripheral neuropathy   3. Peripheral vascular disease           Plan:  Patient was evaluated and treated and all questions answered. Discussed neuropathy and etiology as well as treatment with patient.  -Discussed and educated patient on foot care, especially with  regards to the vascular, neurological and musculoskeletal systems.  -Discussed supportive shoes at all times and checking feet regularly.  -Nails 1-5 b/l debrided today without incident.  -Patient to return in 3 months for at risk foot care.    Asberry Failing, DPM  "

## 2024-04-24 ENCOUNTER — Other Ambulatory Visit: Payer: Self-pay | Admitting: Urology

## 2024-04-29 ENCOUNTER — Telehealth: Payer: Self-pay | Admitting: Cardiology

## 2024-04-29 NOTE — Telephone Encounter (Signed)
"  ° °  Pre-operative Risk Assessment    Patient Name: Todd Chan  DOB: 01/18/1946 MRN: [id number]   Date of last office visit: 03/17/24 Date of next office visit: Not yet scheduled   Request for Surgical Clearance    Procedure:  Circumcision  Date of Surgery:  Clearance 06/05/24                                Surgeon:  Dr. Ricardo Likens Surgeon's Group or Practice Name:  [facility] Phone number:  [phone]  Fax number:  [phone]   Type of Clearance Requested:   - Medical  - Pharmacy:  Hold        Type of Anesthesia:  General    Additional requests/questions:     SignedHerma KATHEE Blush   04/29/2024, 8:45 AM   "

## 2024-04-29 NOTE — Telephone Encounter (Signed)
 Hi Hao, you recently saw this patient in clinic on 03/17/2024. Are you able to comment on surgical clearance for upcoming circumcision scheduled for 06/05/2024? Please route your response to P CV DIV PREOP. Thank you!  ~Kincade Granberg

## 2024-04-29 NOTE — Telephone Encounter (Signed)
"  ° °  Patient Name: Todd Chan  DOB: [birth year] MRN: [id number]  Primary Cardiologist: Kardie Tobb, DO  Chart reviewed as part of pre-operative protocol coverage. Given past medical history and time since last visit, based on ACC/AHA guidelines, Todd Chan is at acceptable risk for the planned procedure without further cardiovascular testing.   If needed, patient may hold aspirin  for 5 days prior to the procedure and resume as soon as possible afterward at the surgeon's discretion.  I will route this recommendation to the requesting party via Epic fax function and remove from pre-op pool.  Please call with questions.  Todd Chan, Todd Chan 04/29/2024, 12:53 PM  "

## 2024-04-30 ENCOUNTER — Telehealth: Payer: Self-pay | Admitting: Pulmonary Disease

## 2024-04-30 NOTE — Telephone Encounter (Signed)
 Fax received from Dr. Alvaro with [facility] to perform a circumcision  on patient under general anesthesia  Patient needs surgery clearance. Surgery is 06/05/24. Patient was seen on 01/02/24. Office protocol is a risk assessment can be sent to surgeon if patient has been seen in 60 days or less.   Pt needs appt for clearance. He is currently scheduled with Dr. Kara 06/15/24, but his surgery is prior to this and so appt needs to be moved up. Called pt and there was no answer- LMTCB. Routing to clearance pool for f/u.

## 2024-05-01 NOTE — Telephone Encounter (Signed)
 Copied from CRM [id number]. Topic: Appointments - Scheduling Inquiry for Clinic >> May 01, 2024  9:26 AM Russell PARAS wrote: Reason for CRM:   Pt returned missed call from clinic to schedule him an earlier visit, due to needing surgical clearance. He is scheduled for surgery on 3/6; however, his appt with Kara is 3/16.   Was unable to find sooner avail with MD or APP  Requested call back for rescheduling   CB#  [phone]   Attempted to call patient to schedule for sooner appt, no answer and unable to leave VM to offer an appt w/ APP or provider.

## 2024-05-06 NOTE — Telephone Encounter (Signed)
 I called and spoke with the pt's spouse  I have scheduled pt with Dr Kara for 05/20/24 Holding in clearance pool for now

## 2024-05-20 ENCOUNTER — Ambulatory Visit: Admitting: Pulmonary Disease

## 2024-06-03 ENCOUNTER — Ambulatory Visit: Admitting: Neurology

## 2024-06-05 ENCOUNTER — Ambulatory Visit (HOSPITAL_COMMUNITY): Admit: 2024-06-05 | Admitting: Urology

## 2024-06-15 ENCOUNTER — Ambulatory Visit: Admitting: Pulmonary Disease

## 2024-06-23 ENCOUNTER — Ambulatory Visit: Admitting: Neurology

## 2024-07-09 ENCOUNTER — Ambulatory Visit: Admitting: Podiatry
